# Patient Record
Sex: Male | Born: 1935
Health system: Southern US, Community
[De-identification: ages and names within clinical notes are randomized; demographics above are authoritative.]

## PROBLEM LIST (undated history)

## (undated) DIAGNOSIS — I4819 Other persistent atrial fibrillation: Secondary | ICD-10-CM

## (undated) DIAGNOSIS — I739 Peripheral vascular disease, unspecified: Secondary | ICD-10-CM

## (undated) DIAGNOSIS — I495 Sick sinus syndrome: Secondary | ICD-10-CM

## (undated) DIAGNOSIS — I38 Endocarditis, valve unspecified: Secondary | ICD-10-CM

## (undated) DIAGNOSIS — N200 Calculus of kidney: Secondary | ICD-10-CM

## (undated) DIAGNOSIS — E519 Thiamine deficiency, unspecified: Secondary | ICD-10-CM

## (undated) DIAGNOSIS — G709 Myoneural disorder, unspecified: Secondary | ICD-10-CM

## (undated) DIAGNOSIS — Z87442 Personal history of urinary calculi: Secondary | ICD-10-CM

## (undated) DIAGNOSIS — Z8619 Personal history of other infectious and parasitic diseases: Secondary | ICD-10-CM

## (undated) DIAGNOSIS — E785 Hyperlipidemia, unspecified: Secondary | ICD-10-CM

## (undated) DIAGNOSIS — T7840XA Allergy, unspecified, initial encounter: Secondary | ICD-10-CM

## (undated) DIAGNOSIS — K219 Gastro-esophageal reflux disease without esophagitis: Secondary | ICD-10-CM

## (undated) DIAGNOSIS — K828 Other specified diseases of gallbladder: Secondary | ICD-10-CM

## (undated) DIAGNOSIS — M1990 Unspecified osteoarthritis, unspecified site: Secondary | ICD-10-CM

## (undated) DIAGNOSIS — C61 Malignant neoplasm of prostate: Secondary | ICD-10-CM

## (undated) DIAGNOSIS — I10 Essential (primary) hypertension: Secondary | ICD-10-CM

## (undated) DIAGNOSIS — I499 Cardiac arrhythmia, unspecified: Secondary | ICD-10-CM

## (undated) DIAGNOSIS — Z950 Presence of cardiac pacemaker: Secondary | ICD-10-CM

## (undated) DIAGNOSIS — J189 Pneumonia, unspecified organism: Secondary | ICD-10-CM

## (undated) HISTORY — DX: Thiamine deficiency, unspecified: E51.9

## (undated) HISTORY — DX: Personal history of other infectious and parasitic diseases: Z86.19

## (undated) HISTORY — DX: Other persistent atrial fibrillation: I48.19

## (undated) HISTORY — PX: EYE SURGERY: SHX253

## (undated) HISTORY — DX: Allergy, unspecified, initial encounter: T78.40XA

## (undated) HISTORY — DX: Calculus of kidney: N20.0

## (undated) HISTORY — PX: HERNIA REPAIR: SHX51

## (undated) HISTORY — DX: Peripheral vascular disease, unspecified: I73.9

## (undated) HISTORY — PX: JOINT REPLACEMENT: SHX530

## (undated) HISTORY — DX: Endocarditis, valve unspecified: I38

## (undated) HISTORY — DX: Sick sinus syndrome: I49.5

## (undated) HISTORY — DX: Other specified diseases of gallbladder: K82.8

## (undated) HISTORY — DX: Hyperlipidemia, unspecified: E78.5

---

## 1990-09-05 DIAGNOSIS — J189 Pneumonia, unspecified organism: Secondary | ICD-10-CM

## 1990-09-05 HISTORY — DX: Pneumonia, unspecified organism: J18.9

## 2003-09-06 HISTORY — PX: FRACTURE SURGERY: SHX138

## 2009-04-24 ENCOUNTER — Emergency Department (HOSPITAL_BASED_OUTPATIENT_CLINIC_OR_DEPARTMENT_OTHER): Admission: EM | Admit: 2009-04-24 | Discharge: 2009-04-24 | Payer: Self-pay | Admitting: Emergency Medicine

## 2012-01-30 ENCOUNTER — Inpatient Hospital Stay (HOSPITAL_COMMUNITY)
Admission: AD | Admit: 2012-01-30 | Discharge: 2012-02-01 | DRG: 244 | Disposition: A | Discharge status: Home / Self Care | Payer: Medicare Other | Source: Ambulatory Visit | Attending: Internal Medicine | Admitting: Internal Medicine

## 2012-01-30 ENCOUNTER — Encounter (HOSPITAL_COMMUNITY): Payer: Self-pay | Admitting: Family Medicine

## 2012-01-30 ENCOUNTER — Inpatient Hospital Stay (HOSPITAL_COMMUNITY): Payer: Medicare Other

## 2012-01-30 DIAGNOSIS — Z7901 Long term (current) use of anticoagulants: Secondary | ICD-10-CM

## 2012-01-30 DIAGNOSIS — I4821 Permanent atrial fibrillation: Secondary | ICD-10-CM | POA: Diagnosis present

## 2012-01-30 DIAGNOSIS — I495 Sick sinus syndrome: Principal | ICD-10-CM | POA: Diagnosis present

## 2012-01-30 DIAGNOSIS — R011 Cardiac murmur, unspecified: Secondary | ICD-10-CM

## 2012-01-30 DIAGNOSIS — Z950 Presence of cardiac pacemaker: Secondary | ICD-10-CM

## 2012-01-30 DIAGNOSIS — I4891 Unspecified atrial fibrillation: Secondary | ICD-10-CM | POA: Diagnosis present

## 2012-01-30 DIAGNOSIS — I10 Essential (primary) hypertension: Secondary | ICD-10-CM

## 2012-01-30 DIAGNOSIS — M129 Arthropathy, unspecified: Secondary | ICD-10-CM | POA: Diagnosis present

## 2012-01-30 HISTORY — DX: Pneumonia, unspecified organism: J18.9

## 2012-01-30 HISTORY — DX: Essential (primary) hypertension: I10

## 2012-01-30 HISTORY — DX: Unspecified osteoarthritis, unspecified site: M19.90

## 2012-01-30 LAB — DIFFERENTIAL
Eosinophils Absolute: 0.1 10*3/uL (ref 0.0–0.7)
Eosinophils Relative: 1 % (ref 0–5)
Lymphocytes Relative: 19 % (ref 12–46)
Lymphs Abs: 1.7 10*3/uL (ref 0.7–4.0)
Monocytes Absolute: 1.1 10*3/uL — ABNORMAL HIGH (ref 0.1–1.0)
Monocytes Relative: 12 % (ref 3–12)

## 2012-01-30 LAB — COMPREHENSIVE METABOLIC PANEL
AST: 30 U/L (ref 0–37)
BUN: 28 mg/dL — ABNORMAL HIGH (ref 6–23)
CO2: 26 mEq/L (ref 19–32)
Chloride: 105 mEq/L (ref 96–112)
Creatinine, Ser: 1.45 mg/dL — ABNORMAL HIGH (ref 0.50–1.35)
GFR calc Af Amer: 53 mL/min — ABNORMAL LOW (ref >90)
GFR calc non Af Amer: 46 mL/min — ABNORMAL LOW (ref >90)
Glucose, Bld: 107 mg/dL — ABNORMAL HIGH (ref 70–99)
Total Bilirubin: 0.5 mg/dL (ref 0.3–1.2)

## 2012-01-30 LAB — MAGNESIUM: Magnesium: 2.3 mg/dL (ref 1.5–2.5)

## 2012-01-30 LAB — CBC
HCT: 40.4 % (ref 39.0–52.0)
MCH: 30.7 pg (ref 26.0–34.0)
MCV: 88.6 fL (ref 78.0–100.0)
RBC: 4.56 MIL/uL (ref 4.22–5.81)
WBC: 8.9 10*3/uL (ref 4.0–10.5)

## 2012-01-30 LAB — CARDIAC PANEL(CRET KIN+CKTOT+MB+TROPI)
Relative Index: INVALID (ref 0.0–2.5)
Total CK: 55 U/L (ref 7–232)

## 2012-01-30 MED ORDER — LORATADINE 10 MG PO TABS
10.0000 mg | ORAL_TABLET | Freq: Every day | ORAL | Status: DC | PRN
  Filled 2012-01-30: qty 1

## 2012-01-30 MED ORDER — NITROGLYCERIN 0.4 MG SL SUBL: 0.4000 mg | SUBLINGUAL_TABLET | SUBLINGUAL | Status: DC | PRN

## 2012-01-30 MED ORDER — SODIUM CHLORIDE 0.9 % IV SOLN: 250.0000 mL | INTRAVENOUS | Status: DC | PRN

## 2012-01-30 MED ORDER — ONDANSETRON HCL 4 MG/2ML IJ SOLN: 4.0000 mg | Freq: Four times a day (QID) | INTRAMUSCULAR | Status: DC | PRN

## 2012-01-30 MED ORDER — PNEUMOCOCCAL VAC POLYVALENT 25 MCG/0.5ML IJ INJ
0.5000 mL | INJECTION | INTRAMUSCULAR | Status: AC
  Filled 2012-01-30: qty 0.5

## 2012-01-30 MED ORDER — SODIUM CHLORIDE 0.9 % IJ SOLN: 3.0000 mL | INTRAMUSCULAR | Status: DC | PRN

## 2012-01-30 MED ORDER — ALPRAZOLAM 0.25 MG PO TABS: 0.2500 mg | ORAL_TABLET | Freq: Two times a day (BID) | ORAL | Status: DC | PRN

## 2012-01-30 MED ORDER — ZOLPIDEM TARTRATE 5 MG PO TABS: 5.0000 mg | ORAL_TABLET | Freq: Every evening | ORAL | Status: DC | PRN

## 2012-01-30 MED ORDER — ACETAMINOPHEN 325 MG PO TABS: 650.0000 mg | ORAL_TABLET | ORAL | Status: DC | PRN

## 2012-01-30 MED ORDER — METOPROLOL TARTRATE 25 MG PO TABS
25.0000 mg | ORAL_TABLET | Freq: Two times a day (BID) | ORAL | Status: DC
  Administered 2012-01-30 – 2012-01-31 (×2): 25 mg via ORAL
  Filled 2012-01-30 (×3): qty 1

## 2012-01-30 MED ORDER — SODIUM CHLORIDE 0.9 % IJ SOLN
3.0000 mL | Freq: Two times a day (BID) | INTRAMUSCULAR | Status: DC
  Administered 2012-01-30 – 2012-01-31 (×2): 3 mL via INTRAVENOUS

## 2012-01-30 NOTE — H&P (Signed)
History and Physical  Patient ID: Brian Davidson Patient ID: Brian Davidson MRN: [id number], DOB/AGE: 74-15-1937 5 y.o. Date of Encounter: 01/30/2012  Primary Physician: [facility] Primary Cardiologist: Arnette Felts, PA-C; [place]  Chief Complaint:  Atrial fib - slow VR   HPI: Brian Davidson is a 76 year old male with a history of atrial fibrillation but not coronary artery disease. He is chronically anticoagulated with Coumadin. Recently, he has noticed episodes of weakness. They occur with moderate exertion. He will feel weak and short of breath. He will have difficulty walking because his legs feel very heavy. He also feel like his legs are swelling when he stands to walk. The episodes have been intermittent but have been happening Davidson frequently. He was evaluated for them wants but no abnormality was found. Over the last few days, he has had chest tightness and pressure associated with the symptoms. His wife took his blood pressure during an episode today and his systolic blood pressure was 90 with a heart rate in the 40s. He called Arnette Felts, who saw him in the office. In the office he was found to be hypotensive with a heart rate in the 40s. Admission was arranged at [facility] by Dr. Johney Frame for consideration of a pacemaker. Currently at rest, he is asymptomatic. Of note, he generally does not have palpitations and denies any history of presyncope or syncope until his recent problems.  Past Medical History  Diagnosis Date  . Hypertension   . Atrial fibrillation with slow ventricular response   . Heart murmur   . Shortness of breath   . Pneumonia 1992  . Arthritis   . Chronic anticoagulation    Surgical History:  Past Surgical History  Procedure Date  . Fracture surgery 2005    left ankle    I have reviewed the patient's current medications. Medication Sig  irbesartan-hydrochlorothiazide (AVALIDE) 150-12.5 MG per tablet Take 1 tablet by mouth daily.  loratadine (CLARITIN) 10  MG tablet Take 10 mg by mouth daily as needed. For seasonal allergies  metoprolol succinate (TOPROL-XL) 100 MG 24 hr tablet Take 100 mg by mouth 2 (two) times daily. Take with or immediately following a meal.  verapamil (CALAN) 120 MG tablet Take 120 mg by mouth 2 (two) times daily.  warfarin (COUMADIN) 5 MG tablet Take 5-7.5 mg by mouth daily. Sunday and Tuesday 7.5mg ;  and the rest of the week 5mg    Scheduled Meds:   . pneumococcal 23 valent vaccine  0.5 mL Intramuscular Tomorrow-1000   Continuous Infusions:  PRN Meds:.  Allergies:  Allergies  Allergen Reactions  . Sulfa Antibiotics     Childhood reaction   History   Social History  . Marital Status: Married    Spouse Name: N/A    Number of Children: N/A  . Years of Education: N/A   Occupational History  . Retired   . Part-time driver for car company    Social History Main Topics  . Smoking status: Former Smoker -- 10 years    Types: Pipe, Cigars  . Smokeless tobacco: Never Used  . Alcohol Use: 1.8 oz/week    3 Cans of beer per week  . Drug Use: No  . Sexually Active: Yes   Other Topics Concern  . Not on file   Social History Narrative   Lives with wife, still works part-time as a Hospital doctor. He is active around the house and yard.Marland KitchenMarland KitchenParents are deceased and did not have cardiac issues but he has 2  siblings with atrial fibrillation and a sister-in-law  has a pacemaker.    Review of Systems:  He has no recent illnesses, fevers or chills. He has occasional musculoskeletal aches and pains but they do not significantly limited his activity. He has not had bleeding issues on the Coumadin and his dose rarely has to be adjusted. He has no GI problems and no respiratory difficulty until his current illness. Full 14-point review of systems otherwise negative except as noted above.  Physical Exam: Blood pressure 107/70, pulse 59, temperature 97.5 F (36.4 C), temperature source Oral, resp. rate 18, height 6' (1.829 m), weight 215  lb 9.8 oz (97.8 kg), SpO2 98.00%. General: Well developed, well nourished, male in no acute distress. Head: Normocephalic, atraumatic, sclera non-icteric, no xanthomas, nares are without discharge. Dentition: Good Neck: No carotid bruits. JVD not elevated. No thyromegally Lungs: Good expansion bilaterally. without wheezes or rhonchi. Very few Rales Heart: IRRegular rate and rhythm with S1 S2. Soft systolic murmur, no rubs, or gallops appreciated. Abdomen: Soft, non-tender, non-distended with normoactive bowel sounds. No hepatomegaly. No rebound/guarding. No obvious abdominal masses. Msk:  Strength and tone appear normal for age. No joint deformities or effusions, no spine or costo-vertebral angle tenderness. Extremities: No clubbing or cyanosis. No edema.  Distal pedal pulses are 2+ in 4 extrem Neuro: Alert and oriented X 3. Moves all extremities spontaneously. No focal deficits noted. Psych:  Responds to questions appropriately with a normal affect. Skin: No rashes or lesions noted  Labs: Pending   Telemetry: Atrial fibrillation, a heart rate as low as 39   ASSESSMENT AND PLAN:  Principal Problem:  *Atrial fibrillation with slow ventricular response - we will hold his metoprolol XL 100 mg BID and his verapamil 120 mg daily. He will be monitored on telemetry. He will be evaluated by Dr. Johney Frame in a.m. and considered for a pacemaker. He may have tachycardia/bradycardia syndrome.  Active Problems: Chest pain - He had symptoms with exertion recently which is unusual for him. Will cycle cardiac enzymes and consider an echo. Cath if enzymes are abnormal, otherwise follow up with Arnette Felts.    Heart murmur - Brian Davidson states he has 2 leaky valves, order echo, follow up with [facility]   Chronic anticoagulation - in the office today his INR was 2.4. We will check it now and hold coumadin until the need for an ischemic eval is determined.  SignedBjorn Loser Barrett PA-C 01/30/2012, 4:07  PM   Attending Note:   The patient was seen and examined.  Agree with assessment and plan as noted above.  Pt has chronic A-fib.  He has recently developed severe chest heaviness, tightness and generalized weakness.  He has been noted to have slow HR and low BP for the past 2 days.  He also has noted leg heaviness.    Exam is as above.  Chronic A-fib.    ECG shows rate controlled A-fib.    Imp / Plan:  I am concerned about ischemic heart disease as well as his bradycarda.  I would hold his verapamil and decrease his metoprolol to 25 bid. He will likely need a pacer at some point. I would hold his coumadin for now until we know whether or not he will need a cath.  At a minimum, he will need a stress myoview.  Check cardiac enzymes.  TSH,    Vesta Mixer, Montez Hageman., MD, [facility] 01/30/2012, 5:58 PM

## 2012-01-30 NOTE — Progress Notes (Signed)
ANTICOAGULATION CONSULT NOTE - Initial Consult  Pharmacy Consult for heparin Indication: atrial fibrillation  Allergies  Allergen Reactions  . Sulfa Antibiotics     Childhood reaction    Patient Measurements: Height: 6' (182.9 cm) Weight: 215 lb 9.8 oz (97.8 kg) IBW/kg (Calculated) : 77.6  Heparin Dosing Weight: 97.8kg  Vital Signs: Temp: 97.5 F (36.4 C) (05/27 1402) Temp src: Oral (05/27 1402) BP: 107/70 mmHg (05/27 1402) Pulse Rate: 59  (05/27 1402)  Labs:  Basename 01/30/12 1829  HGB 14.0  HCT 40.4  PLT 125*  APTT --  LABPROT 25.2*  INR 2.24*  HEPARINUNFRC --  CREATININE 1.45*  CKTOTAL 55  CKMB 1.8  TROPONINI <0.30    Estimated Creatinine Clearance: 54.2 ml/min (by C-G formula based on Cr of 1.45).   Medical History: Past Medical History  Diagnosis Date  . Hypertension   . Atrial fibrillation with slow ventricular response   . Heart murmur   . Shortness of breath   . Pneumonia 1992  . Arthritis   . Chronic anticoagulation     Medications:  Coumadin PTA - on hold   Assessment: 68 yoM admitted for CP, hypotension, HR in 40s. Dr. Johney Frame to see pt for consideration of Afib.  Coumadin to be held and IV heparin started once INR < 2 for possible cath procedure.  Current INR 2.24.  Hgb ok, plts low @ 125.  No bleeding noted.    Goal of Therapy:  Heparin level 0.3-0.7 units/ml Monitor platelets by anticoagulation protocol: Yes   Plan:  1.  Hold Coumadin 2.  F/u AM PT/INR, CBC 3.  Start IV heparin once INR < 2.   4.  F/u cath plans  Lataisha Colan E 01/30/2012,7:33 PM

## 2012-01-31 ENCOUNTER — Encounter (HOSPITAL_COMMUNITY)
Admission: AD | Disposition: A | Discharge status: Home / Self Care | Payer: Self-pay | Source: Ambulatory Visit | Attending: Internal Medicine

## 2012-01-31 ENCOUNTER — Encounter (HOSPITAL_COMMUNITY): Payer: Self-pay | Admitting: Internal Medicine

## 2012-01-31 DIAGNOSIS — I498 Other specified cardiac arrhythmias: Secondary | ICD-10-CM

## 2012-01-31 DIAGNOSIS — I495 Sick sinus syndrome: Principal | ICD-10-CM

## 2012-01-31 HISTORY — PX: PERMANENT PACEMAKER INSERTION: SHX5480

## 2012-01-31 LAB — CARDIAC PANEL(CRET KIN+CKTOT+MB+TROPI)
Relative Index: INVALID (ref 0.0–2.5)
Total CK: 51 U/L (ref 7–232)
Total CK: 47 U/L (ref 7–232)
Troponin I: <0.3 ng/mL (ref <0.30)

## 2012-01-31 LAB — LIPID PANEL
Cholesterol: 157 mg/dL (ref 0–200)
VLDL: 22 mg/dL (ref 0–40)

## 2012-01-31 LAB — PROTIME-INR: INR: 2.27 — ABNORMAL HIGH (ref 0.00–1.49)

## 2012-01-31 SURGERY — PERMANENT PACEMAKER INSERTION
Anesthesia: LOCAL

## 2012-01-31 MED ORDER — ACETAMINOPHEN 325 MG PO TABS
325.0000 mg | ORAL_TABLET | ORAL | Status: DC | PRN
  Administered 2012-01-31 – 2012-02-01 (×3): 650 mg via ORAL
  Filled 2012-01-31 (×3): qty 2

## 2012-01-31 MED ORDER — SODIUM CHLORIDE 0.9 % IV SOLN
INTRAVENOUS | Status: DC
  Administered 2012-01-31: 12:00:00 via INTRAVENOUS

## 2012-01-31 MED ORDER — SODIUM CHLORIDE 0.9 % IR SOLN
80.0000 mg | Status: DC
  Filled 2012-01-31: qty 2

## 2012-01-31 MED ORDER — CEFAZOLIN SODIUM 1-5 GM-% IV SOLN
1.0000 g | Freq: Four times a day (QID) | INTRAVENOUS | Status: DC
  Filled 2012-01-31 (×3): qty 50

## 2012-01-31 MED ORDER — SODIUM CHLORIDE 0.45 % IV SOLN
INTRAVENOUS | Status: DC
  Administered 2012-01-31: 10 mL/h via INTRAVENOUS

## 2012-01-31 MED ORDER — ONDANSETRON HCL 4 MG/2ML IJ SOLN: 4.0000 mg | Freq: Four times a day (QID) | INTRAMUSCULAR | Status: DC | PRN

## 2012-01-31 MED ORDER — HEPARIN (PORCINE) IN NACL 2-0.9 UNIT/ML-% IJ SOLN
INTRAMUSCULAR | Status: AC
  Filled 2012-01-31: qty 1000

## 2012-01-31 MED ORDER — MIDAZOLAM HCL 2 MG/2ML IJ SOLN
INTRAMUSCULAR | Status: AC
  Filled 2012-01-31: qty 2

## 2012-01-31 MED ORDER — HYDROCODONE-ACETAMINOPHEN 5-325 MG PO TABS: 1.0000 | ORAL_TABLET | ORAL | Status: DC | PRN

## 2012-01-31 MED ORDER — METOPROLOL SUCCINATE ER 100 MG PO TB24
100.0000 mg | ORAL_TABLET | Freq: Two times a day (BID) | ORAL | Status: DC
  Administered 2012-01-31 – 2012-02-01 (×2): 100 mg via ORAL
  Filled 2012-01-31 (×4): qty 1

## 2012-01-31 MED ORDER — CHLORHEXIDINE GLUCONATE 4 % EX LIQD
60.0000 mL | Freq: Once | CUTANEOUS | Status: DC
  Filled 2012-01-31: qty 60

## 2012-01-31 MED ORDER — FENTANYL CITRATE 0.05 MG/ML IJ SOLN
INTRAMUSCULAR | Status: AC
  Filled 2012-01-31: qty 2

## 2012-01-31 MED ORDER — CEFAZOLIN SODIUM-DEXTROSE 2-3 GM-% IV SOLR
2.0000 g | INTRAVENOUS | Status: DC
  Filled 2012-01-31: qty 50

## 2012-01-31 MED ORDER — CEFAZOLIN SODIUM 1-5 GM-% IV SOLN
1.0000 g | Freq: Four times a day (QID) | INTRAVENOUS | Status: DC
  Filled 2012-01-31 (×2): qty 50

## 2012-01-31 MED ORDER — LIDOCAINE HCL (PF) 1 % IJ SOLN
INTRAMUSCULAR | Status: AC
  Filled 2012-01-31: qty 60

## 2012-01-31 NOTE — H&P (Signed)
  See my consult note for full details.

## 2012-01-31 NOTE — Care Management Note (Signed)
    Page 1 of 1   01/31/2012     8:33:35 AM   CARE MANAGEMENT NOTE 01/31/2012  Patient:  Brian Davidson, Brian Davidson   Account Number:  [id number]  Date Initiated:  01/31/2012  Documentation initiated by:  [facility]  Subjective/Objective Assessment:   h/o Afib - now with brady and dizziness - possible pacemaker needed.  Has spouse.     Action/Plan:   Anticipated DC Date:  02/03/2012   Anticipated DC Plan:  HOME/SELF CARE      DC Planning Services  CM consult      Choice offered to / List presented to:             Status of service:  In process, will continue to follow Medicare Important Message given?   (If response is "NO", the following Medicare IM given date fields will be blank) Date Medicare IM given:   Date Additional Medicare IM given:    Discharge Disposition:    Per UR Regulation:  Reviewed for med. necessity/level of care/duration of stay  If discussed at Long Length of Stay Meetings, dates discussed:    Comments:  PCP - [facility] - Cardiology - Arnette Felts PA  Contact Spouse [place] [phone]  cell [phone]

## 2012-01-31 NOTE — Op Note (Addendum)
SURGEON:  Hillis Range, MD     PREPROCEDURE DIAGNOSIS:    Permanent atrial fibrillation and tachycardia bradycardia syndrome.    POSTPROCEDURE DIAGNOSIS:  Permanent atrial fibrillation and tachycardia bradycardia syndrome.      PROCEDURES:   1. Left upper extremity venography.   2. Pacemaker implantation.     INTRODUCTION: Brian Davidson is a 76 y.o. male  with a history of permanent atrial fibrillation who presents today for pacemaker implantation.  The patient reports intermittent episodes of dizziness over the past few days with associated fatigue.  He has been observed to have symptomatic bradycardia.  Off of rate controlling medicines for his afib, he has developed afib with RVR.  The patient therefore presents today for pacemaker implantation.  Prior to the procedure, a limited echo was performed by me which revealed a relatively preserved ejection fraction.    DESCRIPTION OF PROCEDURE:  Informed written consent was obtained, and  the patient was brought to the electrophysiology lab in a fasting state.  The patient was adequately sedated with IV versed and fentanyl as outlined in the nursing report for the procedure today.  The patients left chest was prepped and draped in the usual sterile fashion by the EP lab staff. The skin overlying the left deltopectoral region was infiltrated with lidocaine for local analgesia.  A 4-cm incision was made over the left deltopectoral region.  A left subcutaneous pacemaker pocket was fashioned using a combination of sharp and blunt dissection. Electrocautery was required to assure hemostasis.    Left Upper Extremity Venography: A venogram of the left upper extremity was performed, which revealed a large left cephalic vein, which emptied into a large left subclavian vein.  The left axillary vein was moderate in size.    RA/RV Lead Placement: Access of the axillary vein was difficult and several attempts were made without success.  The left cephalic vein  was therefore directly visualized and cannulated.  Through the left cephalic vein, a [employer] Jude Medical model [id number] (serial number  [id number]) right ventricular lead were advanced with fluoroscopic visualization into the right atrial appendage and right ventricular apex positions respectively.  Initial right ventricular lead R-waves measured 13 mV with an impedance of 725 ohms and a threshold of 0.3 V at 0.5 msec.  Both leads were secured to the pectoralis fascia using #2-0 silk over the suture sleeves.   Device Placement:  The leads were then connected to a [employer] Accent SR RF model  [id number] (serial number [id number] ) pacemaker.  The pocket was irrigated with copious gentamicin solution.  The pacemaker was then placed into the pocket.  The pocket was then closed in 2 layers with 2.0 Vicryl suture for the subcutaneous and subcuticular layers.  Steri-  Strips and a sterile dressing were then applied.  There were no early apparent complications.     CONCLUSIONS:   1. Successful implantation of a Industrial/product designer SR RF dual-chamber pacemaker for tachycardia bradycardia syndrome  2. No early apparent complications.           Hillis Range, MD 01/31/2012 4:36 PM

## 2012-01-31 NOTE — Consult Note (Signed)
ELECTROPHYSIOLOGY CONSULT NOTE    Referring Physician: Arnette Felts  Admit Date: 01/30/2012  Reason for consultation: tachybrady syndrome  Brian Davidson is a 76 y.o. male with a h/o longstanding persistent atrial fibrillation who presents with symptomatic bradycardia. He was diagnosed with atrial fibrillation many years ago.  He reports failing rhythm control at that time.  He has been managed previously by Dr Shelva Majestic with a strategy of rate control and anticoagulation.  He reports doing very well with this regimen until recently.  Per Arnette Felts, he has had elevated ventricular rates necessitating rate controlling medicines. Over the past week, he has had intermittent dizziness and fatigue.  He denies syncope.  He checked his pulse with these symptoms and found it to be in the 40s.  Upon recent evaluation by Mr Elise Benne, his heart rate in afib was 40s.  He was admitted to [facility] and rate controlling medicines were held.  His heart rates are now 120s.  EP is therefore consulted for further management.    Past Medical History  Diagnosis Date  . Hypertension   . Atrial fibrillation with slow ventricular response     long standing persistent atrial fibrillation  . Heart murmur   . Pneumonia 1992  . Arthritis   . Chronic anticoagulation    Past Surgical History  Procedure Date  . Fracture surgery 2005    left ankle       . metoprolol tartrate  25 mg Oral BID  . pneumococcal 23 valent vaccine  0.5 mL Intramuscular Tomorrow-1000  . sodium chloride  3 mL Intravenous Q12H      Allergies  Allergen Reactions  . Sulfa Antibiotics     Childhood reaction    History   Social History  . Marital Status: Married    Spouse Name: N/A    Number of Children: N/A  . Years of Education: N/A   Occupational History  . Retired   . Part-time driver for car company    Social History Main Topics  . Smoking status: Former Smoker -- 10 years    Types: Pipe, Cigars  . Smokeless tobacco:  Never Used  . Alcohol Use: No  . Drug Use: No  . Sexually Active: Yes   Other Topics Concern  . Not on file   Social History Narrative   Lives with wife, still works part-time as a Hospital doctor. He is active around the house and yard.Marland KitchenMarland KitchenParents are deceased and did not have cardiac issues but he has 2 siblings with atrial fibrillation and a sister-in-law  has a pacemaker.    Family History  Problem Relation Age of Onset  . Hypertension      ROS- All systems are reviewed and negative except as per the HPI above  Physical Exam: Telemetry: reveals initially afib with V rates 40s, now afib with V rates 120a Filed Vitals:   01/30/12 1402 01/30/12 2036 01/30/12 2038 01/31/12 0515  BP: 107/70  149/88 140/87  Pulse: 59 86  90  Temp: 97.5 F (36.4 C) 98.2 F (36.8 C)  98.2 F (36.8 C)  TempSrc: Oral Oral  Oral  Resp: 18 16  15   Height: 6' (1.829 m)     Weight: 215 lb 9.8 oz (97.8 kg)   212 lb 4 oz (96.276 kg)  SpO2: 98% 97%  94%    GEN- The patient is well appearing, alert and oriented x 3 today.   Head- normocephalic, atraumatic Eyes-  Sclera clear, conjunctiva pink Ears- hearing intact  Oropharynx- clear Neck- supple, no JVP Lymph- no cervical lymphadenopathy Lungs- Clear to ausculation bilaterally, normal work of breathing Heart- irregular rate and rhythm, 2/6 SEM at the apex GI- soft, NT, ND, + BS Extremities- no clubbing, cyanosis, or edema MS- no significant deformity or atrophy Skin- no rash or lesion Psych- euthymic mood, full affect Neuro- strength and sensation are intact  EKG- afib, 84 bpm  Labs:   Lab Results  Component Value Date   WBC 8.9 01/30/2012   HGB 14.0 01/30/2012   HCT 40.4 01/30/2012   MCV 88.6 01/30/2012   PLT 125* 01/30/2012    Lab 01/30/12 1829  NA 139  K 4.1  CL 105  CO2 26  BUN 28*  CREATININE 1.45*  CALCIUM 9.2  PROT 6.6  BILITOT 0.5  ALKPHOS 78  ALT 34  AST 30  GLUCOSE 107*   Lab Results  Component Value Date   CKTOTAL 47  01/31/2012   CKMB 1.6 01/31/2012   TROPONINI <0.30 01/31/2012    Lab Results  Component Value Date   CHOL 157 01/31/2012   Lab Results  Component Value Date   HDL 34* 01/31/2012   Lab Results  Component Value Date   LDLCALC 101* 01/31/2012   Lab Results  Component Value Date   TRIG 108 01/31/2012   Lab Results  Component Value Date   CHOLHDL 4.6 01/31/2012   No results found for this basename: LDLDIRECT     Echo:  pending  ASSESSMENT AND PLAN:   1.  Afib- long standing persistent  Would rate control long term Continue coumadin (Goal INR 2-3)  2. Tachycardia Bradycardia Syndrome  The patient has symptomatic bradycardia but requires AV nodal agents for rate control of his afib.  I would therefore recommend pacemaker implantation at this time.  Risks, benefits, alternatives to pacemaker implantation were discussed in detail with the patient today. The patient understands that the risks include but are not limited to bleeding, infection, pneumothorax, perforation, tamponade, vascular damage, renal failure, MI, stroke, death,  and lead dislodgement and wishes to proceed. We will therefore schedule the procedure later today.    Hillis Range, MD 01/31/2012  9:34 AM

## 2012-02-01 ENCOUNTER — Encounter: Payer: Self-pay | Admitting: *Deleted

## 2012-02-01 ENCOUNTER — Inpatient Hospital Stay (HOSPITAL_COMMUNITY): Payer: Medicare Other

## 2012-02-01 DIAGNOSIS — I4891 Unspecified atrial fibrillation: Secondary | ICD-10-CM

## 2012-02-01 DIAGNOSIS — Z950 Presence of cardiac pacemaker: Secondary | ICD-10-CM | POA: Insufficient documentation

## 2012-02-01 DIAGNOSIS — I359 Nonrheumatic aortic valve disorder, unspecified: Secondary | ICD-10-CM

## 2012-02-01 LAB — BASIC METABOLIC PANEL
BUN: 24 mg/dL — ABNORMAL HIGH (ref 6–23)
CO2: 22 mEq/L (ref 19–32)
Chloride: 105 mEq/L (ref 96–112)
GFR calc Af Amer: 66 mL/min — ABNORMAL LOW (ref >90)
Potassium: 4 mEq/L (ref 3.5–5.1)

## 2012-02-01 LAB — PROTIME-INR: Prothrombin Time: 21.6 seconds — ABNORMAL HIGH (ref 11.6–15.2)

## 2012-02-01 MED ORDER — WARFARIN - PHARMACIST DOSING INPATIENT: Freq: Every day | Status: DC

## 2012-02-01 MED ORDER — WARFARIN SODIUM 7.5 MG PO TABS
7.5000 mg | ORAL_TABLET | Freq: Once | ORAL | Status: AC
  Administered 2012-02-01: 7.5 mg via ORAL
  Filled 2012-02-01: qty 1

## 2012-02-01 NOTE — Discharge Summary (Signed)
ELECTROPHYSIOLOGY DISCHARGE SUMMARY    Patient ID: Brian Davidson,  MRN: [id number], DOB/AGE: 1935/11/07 76 y.o.  Admit date: 01/30/2012 Discharge date: 02/01/2012  Primary Care Physician: Brian Davidson  Primary Cardiologist: Brian Felts, PA-C at [facility] Primary Electrophysiologist: Brian Range, MD  Primary Discharge Diagnosis:  1.  Tachy-brady syndrome s/p PPM implantation  Secondary Discharge Diagnoses:  1.  Atrial fibrillation 2.  HTN 3.  Coagulopathy secondary to chronic warfarin therapy  Procedures This Admission:  1.  Dual chamber PPM implantation - per operative report - [address] Jude Davidson Accent SR RF model [id number] (serial number [id number] ) pacemaker, St Jude Davidson model [id number] (serial number [id number]) leads were advanced with fluoroscopic visualization into the right atrial appendage and right ventricular apex positions respectively 2.  Transthoracic echocardiogram 02/01/2012 - per report, findings listed below: - Left ventricle: The cavity size was normal. Wall thickness was normal. Systolic function was normal. The estimated ejection fraction was in the Davidson of 55% to 60%. Wall motion was normal; there were no regional wall motion abnormalities. Indeterminant diastolic function. - Aortic valve: Trileaflet; mildly calcified leaflets. There was no stenosis. Mild regurgitation. - Aorta: Ascending aorta was not fully visualized but was dilated to 4.3 cm in the portion visualized. Dilated aortic root. Aortic root dimension: 43mm (ED). - Mitral valve: Mild regurgitation. - Left atrium: The atrium was mildly dilated. - Right ventricle: The cavity size was normal. Pacer wire or catheter noted in right ventricle. Systolic function was normal. - Right atrium: The atrium was mildly dilated. - Tricuspid valve: Peak RV-RA gradient: 26mm Hg (S). - Pulmonary arteries: PA peak pressure: 36mm Hg (S). - Systemic veins: IVC not well-visualized, estimate RA pressure 10  mmHg.  History and Hospital Course:  For history, please see admission H&P for full details. Briefly, Brian Davidson is a 76 year old gentleman with long standing persistent atrial fibrillation who presented with dizziness and fatigue, found to have symptomatic bradycardia with slow AF and rates in the 40s. He was admitted to [facility] and his rate controlling medications held. He subsequently developed RVR. Given tachy-brady syndrome, dual chamber PPM implantation was recommended to allow use of AV nodal blocking agents to treat RVR. Brian Davidson agreed to proceed and he underwent PPM implantation by Dr. Johney Frame on 01/31/2012. There were no immediate complications and he remains hemodynamically stable. His implant site is intact with no evidence of bleeding or hematoma. His chest x-ray shows stable lead placement without pneumothorax. His device interrogation shows normal PPM function with stable lead parameters. His BB and warfarin have been restarted. On telemetry, he remains in AF which is overall rate controlled. He has been seen, examined and deemed stable for discharge by Dr. Hillis Davidson. Of note, his echocardiogram revealed aortic root dilatation (4.3 cm) which Brian Davidson states is being followed by Brian Felts, PA-C.  Discharge Vitals: Blood pressure 148/91, pulse 86, temperature 98.4 F (36.9 C), temperature source Oral, resp. rate 18, height 6' (1.829 m), weight 209 lb 10.5 oz (95.1 kg), SpO2 93.00%.   Labs: Component Value Date   WBC 8.9 01/30/2012   HGB 14.0 01/30/2012   HCT 40.4 01/30/2012   MCV 88.6 01/30/2012   PLT 125* 01/30/2012    Lab 02/01/12 0610 01/30/12 1829  NA 140 --  K 4.0 --  CL 105 --  CO2 22 --  BUN 24* --  CREATININE 1.22 --  CALCIUM 8.9 --  PROT -- 6.6  BILITOT -- 0.5  ALKPHOS --  78  ALT -- 34  AST -- 30  GLUCOSE 91 --    Lab Results  Component Value Date   CKTOTAL 47 01/31/2012   CKMB 1.6 01/31/2012   TROPONINI <0.30 01/31/2012     Basename 02/01/12 0610   INR 1.84*    Disposition:  The patient is being discharged in stable condition.  Follow-up:  Follow up with [facility] on 02/13/2012. (At 11:30 AM for wound check)    Contact information:   [address]  [address] [place], [place] [address] [phone]    Follow up with Brian Range, MD on 05/09/2012. (At 11:00 AM)    Contact information:   [address] [place] [address] [phone]    Follow up with Roxanne Mins, PA-C. Schedule an appointment as soon as possible for a visit on 02/06/2012. (On Monday, 02/06/2012 for Coumadin (INR) follow-up)    Contact information:   [facility]  [address] [place] [address] [phone]    Discharge Medications:   STOP taking these medications     verapamil 120 MG tablet       TAKE these medications     irbesartan-hydrochlorothiazide 150-12.5 MG per tablet   Commonly known as: AVALIDE   Take 1 tablet by mouth daily.      loratadine 10 MG tablet   Commonly known as: CLARITIN   Take 10 mg by mouth daily as needed. For seasonal allergies      metoprolol succinate 100 MG 24 hr tablet   Commonly known as: TOPROL-XL   Take 100 mg by mouth 2 (two) times daily. Take with or immediately following a meal.      warfarin 5 MG tablet   Commonly known as: COUMADIN   Take 5-7.5 mg by mouth daily. Sunday and Tuesday 7.5mg ;  and the rest of the week 5mg       Duration of Discharge Encounter: Greater than 30 minutes including physician time.  Signed, Rick Duff, PA-C 02/01/2012, 2:20 PM  I have seen, examined the patient, and reviewed the above assessment and plan.   Co Sign: Brian Range, MD 02/02/2012 3:02 PM

## 2012-02-01 NOTE — Progress Notes (Signed)
SUBJECTIVE: The patient is doing well today s/p PPM implantation.  At this time, he denies chest pain, shortness of breath, or any new concerns.     Marland Kitchen  ceFAZolin (ANCEF) IV  1 g Intravenous Q6H  . fentaNYL      . heparin      . lidocaine      . metoprolol succinate  100 mg Oral BID  . midazolam      . midazolam      . pneumococcal 23 valent vaccine  0.5 mL Intramuscular Tomorrow-1000  . DISCONTD:  ceFAZolin (ANCEF) IV  1 g Intravenous Q6H  . DISCONTD:  ceFAZolin (ANCEF) IV  2 g Intravenous On Call  . DISCONTD: chlorhexidine  60 mL Topical Once  . DISCONTD: gentamicin irrigation  80 mg Irrigation On Call  . DISCONTD: metoprolol tartrate  25 mg Oral BID  . DISCONTD: sodium chloride  3 mL Intravenous Q12H      . DISCONTD: sodium chloride 10 mL/hr (01/31/12 1204)  . DISCONTD: sodium chloride 50 mL/hr at 01/31/12 1204    OBJECTIVE: Physical Exam: Filed Vitals:   01/31/12 2200 01/31/12 2300 02/01/12 0612 02/01/12 0654  BP: 132/78 142/73  148/91  Pulse: 102 88  86  Temp:    98.4 F (36.9 C)  TempSrc:    Oral  Resp:    18  Height:      Weight:   209 lb 10.5 oz (95.1 kg)   SpO2:  98%  93%    Intake/Output Summary (Last 24 hours) at 02/01/12 0836 Last data filed at 02/01/12 0612  Gross per 24 hour  Intake      0 ml  Output    350 ml  Net   -350 ml    Telemetry reveals afib, V rate 80- 100 bpm  GEN- The patient is well appearing, alert and oriented x 3 today.   Head- normocephalic, atraumatic Eyes-  Sclera clear, conjunctiva pink Ears- hearing intact Oropharynx- clear Neck- supple, no JVP Lymph- no cervical lymphadenopathy Lungs- Clear to ausculation bilaterally, normal work of breathing Heart- irregular rate and rhythm,   GI- soft, NT, ND, + BS Extremities- no clubbing, cyanosis, or edema Skin-  Pacemaker site without hematoma  LABS: Basic Metabolic Panel:  Basename 02/01/12 0610 01/30/12 1829  NA 140 139  K 4.0 4.1  CL 105 105  CO2 22 26  GLUCOSE 91  107*  BUN 24* 28*  CREATININE 1.22 1.45*  CALCIUM 8.9 9.2  MG -- 2.3  PHOS -- --   Liver Function Tests:  [facility] 01/30/12 1829  AST 30  ALT 34  ALKPHOS 78  BILITOT 0.5  PROT 6.6  ALBUMIN 3.5   No results found for this basename: LIPASE:2,AMYLASE:2 in the last 72 hours CBC:  Basename 01/30/12 1829  WBC 8.9  NEUTROABS 5.9  HGB 14.0  HCT 40.4  MCV 88.6  PLT 125*   Cardiac Enzymes:  Basename 01/31/12 0545 01/31/12 0002 01/30/12 1829  CKTOTAL 47 51 55  CKMB 1.6 1.6 1.8  CKMBINDEX -- -- --  TROPONINI <0.30 <0.30 <0.30   BNP: No components found with this basename: POCBNP:3 D-Dimer: No results found for this basename: DDIMER:2 in the last 72 hours Hemoglobin A1C: No results found for this basename: HGBA1C in the last 72 hours Fasting Lipid Panel:  Basename 01/31/12 0545  CHOL 157  HDL 34*  LDLCALC 101*  TRIG 108  CHOLHDL 4.6  LDLDIRECT --   Thyroid Function Tests:  [employer]  01/30/12 1829  TSH 1.357  T4TOTAL --  T3FREE --  THYROIDAB --   Anemia Panel: ASSESSMENT AND PLAN:  Principal Problem:  *Atrial fibrillation with slow ventricular response Active Problems:  Heart murmur  Chronic anticoagulation  Tachycardia-bradycardia syndrome  1. Tachy/brady- doing well s/p PPM CXR reviewed Pacemaker interrogation reviewed  2. afib- resume toprol for rate control Resume home coumadin dosing  Echo remains pending.  Will ambulate.  IF echo is ok and heart rates are controlled he may go home later today.   Hillis Range, MD 02/01/2012 8:36 AM

## 2012-02-01 NOTE — Discharge Instructions (Addendum)
   Supplemental Discharge Instructions for  Pacemaker/Defibrillator Patients  Activity No heavy lifting or vigorous activity with your left/right arm for 6 to 8 weeks.  Do not raise your left/right arm above your head for one week.  Gradually raise your affected arm as drawn below.           06/01                       06/02                       06/03                      06/04       NO DRIVING for 1 week; you may begin driving on 16/06/9603. WOUND CARE   Keep the wound area clean and dry.  Do not get this area wet for one week. No showers for one week; you may shower on 02/08/2012.   The tape/steri-strips on your wound will fall off; do not pull them off.  No bandage is needed on the site.  DO  NOT apply any creams, oils, or ointments to the wound area.   If you notice any drainage or discharge from the wound, any swelling or bruising at the site, or you develop a fever > 101? F after you are discharged home, call the office at once.  Special Instructions   You are still able to use cellular telephones; use the ear opposite the side where you have your pacemaker/defibrillator.  Avoid carrying your cellular phone near your device.   When traveling through airports, show security personnel your identification card to avoid being screened in the metal detectors.  Ask the security personnel to use the hand wand.   Avoid arc welding equipment, MRI testing (magnetic resonance imaging), TENS units (transcutaneous nerve stimulators).  Call the office for questions about other devices.   Avoid electrical appliances that are in poor condition or are not properly grounded.   Microwave ovens are safe to be near or to operate.

## 2012-02-01 NOTE — Progress Notes (Signed)
*  PRELIMINARY RESULTS* Echocardiogram 2D Echocardiogram has been performed.  Brian Davidson 02/01/2012, 12:54 PM

## 2012-02-01 NOTE — Progress Notes (Signed)
ANTICOAGULATION CONSULT NOTE - Initial Consult  Pharmacy Consult for Coumadin Indication: Afib  Allergies  Allergen Reactions  . Sulfa Antibiotics     Childhood reaction    Patient Measurements: Height: 6' (182.9 cm) Weight: 209 lb 10.5 oz (95.1 kg) IBW/kg (Calculated) : 77.6  Heparin Dosing Weight:   Vital Signs: Temp: 98.4 F (36.9 C) (05/29 0654) Temp src: Oral (05/29 0654) BP: 148/91 mmHg (05/29 0654) Pulse Rate: 86  (05/29 0654)  Labs:  Alvira Philips 02/01/12 0610 01/31/12 0545 01/31/12 0002 01/30/12 1829  HGB -- -- -- 14.0  HCT -- -- -- 40.4  PLT -- -- -- 125*  APTT -- -- -- --  LABPROT 21.6* 25.4* -- 25.2*  INR 1.84* 2.27* -- 2.24*  HEPARINUNFRC -- -- -- --  CREATININE 1.22 -- -- 1.45*  CKTOTAL -- 47 51 55  CKMB -- 1.6 1.6 1.8  TROPONINI -- <0.30 <0.30 <0.30    Estimated Creatinine Clearance: 63.6 ml/min (by C-G formula based on Cr of 1.22).   Medical History: Past Medical History  Diagnosis Date  . Hypertension   . Atrial fibrillation with slow ventricular response     long standing persistent atrial fibrillation  . Heart murmur   . Pneumonia 1992  . Arthritis   . Chronic anticoagulation     Medications:  Prescriptions prior to admission  Medication Sig Dispense Refill  . irbesartan-hydrochlorothiazide (AVALIDE) 150-12.5 MG per tablet Take 1 tablet by mouth daily.      Marland Kitchen loratadine (CLARITIN) 10 MG tablet Take 10 mg by mouth daily as needed. For seasonal allergies      . metoprolol succinate (TOPROL-XL) 100 MG 24 hr tablet Take 100 mg by mouth 2 (two) times daily. Take with or immediately following a meal.      . verapamil (CALAN) 120 MG tablet Take 120 mg by mouth 2 (two) times daily.      Marland Kitchen warfarin (COUMADIN) 5 MG tablet Take 5-7.5 mg by mouth daily. Sunday and Tuesday 7.5mg ;  and the rest of the week 5mg         Assessment: 76yom to restart Coumadin for Afib s/p PPM placement (5/28). INR (1.84) is subtherapeutic after decreasing with held doses  x 2 while awaiting PPM placement. PTA regimen: 5mg  daily except 7.5mg  Tue/Sun. Since INR has dropped, will go ahead and restart with 7.5mg  dose tonight. - No CBC this AM - No significant bleeding reported  Goal of Therapy:  INR 2-3   Plan:  1. Coumadin 7.5mg  po x 1 today 2. Daily PT/INR  Cleon Dew [phone] 02/01/2012,9:02 AM

## 2012-02-13 ENCOUNTER — Ambulatory Visit (INDEPENDENT_AMBULATORY_CARE_PROVIDER_SITE_OTHER): Payer: Medicare Other | Admitting: *Deleted

## 2012-02-13 ENCOUNTER — Encounter: Payer: Self-pay | Admitting: Internal Medicine

## 2012-02-13 DIAGNOSIS — I4891 Unspecified atrial fibrillation: Secondary | ICD-10-CM

## 2012-02-13 NOTE — Progress Notes (Signed)
Wound check-PPM 

## 2012-02-14 LAB — PACEMAKER DEVICE OBSERVATION: BATTERY VOLTAGE: 3.1133 V

## 2012-02-15 ENCOUNTER — Telehealth: Payer: Self-pay | Admitting: Internal Medicine

## 2012-02-15 NOTE — Telephone Encounter (Signed)
Spoke w/pt and pt aware that transmitter set up properly. Transmission was received on 02-11-12. Pt aware and all questions was answered.

## 2012-02-15 NOTE — Telephone Encounter (Signed)
Please return call to patient at hm or cell# regarding questions about his device as well as remote transmission.

## 2012-03-13 ENCOUNTER — Encounter: Payer: Self-pay | Admitting: Internal Medicine

## 2012-04-03 ENCOUNTER — Other Ambulatory Visit: Payer: Self-pay | Admitting: *Deleted

## 2012-04-03 ENCOUNTER — Telehealth: Payer: Self-pay | Admitting: *Deleted

## 2012-04-03 MED ORDER — DILTIAZEM HCL ER COATED BEADS 120 MG PO CP24
120.0000 mg | ORAL_CAPSULE | Freq: Every day | ORAL | Status: DC
Start: 2012-04-03 — End: 2012-05-09

## 2012-04-03 NOTE — Telephone Encounter (Signed)
Spoke with pt in regards to transmission with alerts on 03-13-12. Pt had 3 high ventricular rates. Pt taking Toprol XL 100mg  bid with no missed doses. Per Dr Johney Frame add Diltiazem CD 120mg . Pt aware of adding medication. All questions was answered and [facility] [address] in [place].

## 2012-05-09 ENCOUNTER — Encounter: Payer: Self-pay | Admitting: Internal Medicine

## 2012-05-09 ENCOUNTER — Telehealth: Payer: Self-pay | Admitting: Internal Medicine

## 2012-05-09 ENCOUNTER — Ambulatory Visit (INDEPENDENT_AMBULATORY_CARE_PROVIDER_SITE_OTHER): Payer: Medicare Other | Admitting: Internal Medicine

## 2012-05-09 VITALS — BP 134/86 | HR 114 | Resp 18 | Ht 72.0 in | Wt 215.1 lb

## 2012-05-09 DIAGNOSIS — I4891 Unspecified atrial fibrillation: Secondary | ICD-10-CM

## 2012-05-09 DIAGNOSIS — I10 Essential (primary) hypertension: Secondary | ICD-10-CM

## 2012-05-09 DIAGNOSIS — I495 Sick sinus syndrome: Secondary | ICD-10-CM

## 2012-05-09 LAB — PACEMAKER DEVICE OBSERVATION
BMOD-0002RV: 10
BRDY-0002RV: 60 {beats}/min
RV LEAD AMPLITUDE: 12 mv
RV LEAD IMPEDENCE PM: 700 Ohm
VENTRICULAR PACING PM: 7.2

## 2012-05-09 MED ORDER — DILTIAZEM HCL ER COATED BEADS 120 MG PO CP24: 120.0000 mg | ORAL_CAPSULE | Freq: Every day | ORAL | Status: DC

## 2012-05-09 MED ORDER — DILTIAZEM HCL ER COATED BEADS 240 MG PO CP24: 240.0000 mg | ORAL_CAPSULE | Freq: Every day | ORAL | Status: DC

## 2012-05-09 NOTE — Assessment & Plan Note (Signed)
Normal pacemaker function See Pace Art report No changes today  Pacemaker interrogation reveals frequently elevated ventricular rates Increase cardizem CD to 240mg  daily

## 2012-05-09 NOTE — Progress Notes (Signed)
PCP: Brian Davidson is a 76 y.o. male who presents today for routine electrophysiology followup.  Since his recent pacemaker implantation, the patient reports doing very well.  His exercise tolerance and leg fatigue have dramatically improved.  Today, he denies symptoms of palpitations, chest pain, shortness of breath,  lower extremity edema (above baseline), dizziness, presyncope, or syncope.  The patient is otherwise without complaint today.   Past Medical History  Diagnosis Date  . Hypertension   . Atrial fibrillation with slow ventricular response     long standing persistent atrial fibrillation  . Heart murmur   . Pneumonia 1992  . Arthritis   . Chronic anticoagulation   . Tachycardia-bradycardia     s/p PPM   Past Surgical History  Procedure Date  . Fracture surgery 2005    left ankle  . Pacemaker insertion 01/31/12    SJM Accent SR RF implanted by DR Meosha Castanon    Current Outpatient Prescriptions  Medication Sig Dispense Refill  . diltiazem (CARDIZEM CD) 120 MG 24 hr capsule Take 1 capsule (120 mg total) by mouth daily.  90 capsule  3  . irbesartan-hydrochlorothiazide (AVALIDE) 150-12.5 MG per tablet Take 1 tablet by mouth daily.      . metoprolol succinate (TOPROL-XL) 100 MG 24 hr tablet Take 100 mg by mouth 2 (two) times daily. Take with or immediately following a meal.      . warfarin (COUMADIN) 5 MG tablet Take 5-7.5 mg by mouth daily. Sunday and Tuesday 7.5mg ;  and the rest of the week 5mg       . loratadine (CLARITIN) 10 MG tablet Take 10 mg by mouth daily as needed. For seasonal allergies        Physical Exam: Filed Vitals:   05/09/12 1126  BP: 134/86  Pulse: 114  Resp: 18  Height: 6' (1.829 m)  Weight: 215 lb 1.9 oz (97.578 kg)  SpO2: 97%    GEN- The patient is well appearing, alert and oriented x 3 today.   Head- normocephalic, atraumatic Eyes-  Sclera clear, conjunctiva pink Ears- hearing intact Oropharynx- clear Lungs- Clear to ausculation  bilaterally, normal work of breathing Heart- irrgular rate and rhythm, no murmurs, rubs or gallops, PMI not laterally displaced GI- soft, NT, ND, + BS Extremities- no clubbing, cyanosis, or edema Pacemaker pocket is well healed    Assessment and Plan:

## 2012-05-09 NOTE — Addendum Note (Signed)
Addended by: Gypsy Balsam K on: 05/09/2012 11:56 AM   Modules accepted: Orders

## 2012-05-09 NOTE — Assessment & Plan Note (Signed)
Continue coumadin long term   Return in 9 months

## 2012-05-09 NOTE — Assessment & Plan Note (Signed)
Increase cardizem as above

## 2012-05-09 NOTE — Patient Instructions (Addendum)
Your physician wants you to follow-up in: 9 months with Dr Johney Frame.  You will receive a reminder letter in the mail two months in advance. If you don't receive a letter, please call our office to schedule the follow-up appointment.   Increase Cardizem to 240mg  1 tablet daily.  You can take 2 tablets of the 120mg  until you run out.

## 2012-05-09 NOTE — Telephone Encounter (Signed)
Was sent to [facility]

## 2012-05-09 NOTE — Telephone Encounter (Signed)
Pt requested  Diltiazem  [facility], would [facility]

## 2012-05-15 ENCOUNTER — Telehealth: Payer: Self-pay | Admitting: Internal Medicine

## 2012-05-15 MED ORDER — DILTIAZEM HCL ER COATED BEADS 240 MG PO CP24: 240.0000 mg | ORAL_CAPSULE | Freq: Every day | ORAL | Status: DC

## 2012-05-15 NOTE — Telephone Encounter (Signed)
RX was sent in for 240mg    He is to take 2 of the 120mg  until the 240mg  come in

## 2012-05-15 NOTE — Telephone Encounter (Signed)
New Problem:     Patient called in wanting to speak with you to ensure that his prescription for diltiazem (CARDIZEM CD) 120 MG 24 hr capsule was called in to [facility] for double the dose.  Please call back.

## 2012-05-16 ENCOUNTER — Other Ambulatory Visit: Payer: Self-pay

## 2012-05-16 MED ORDER — DILTIAZEM HCL ER COATED BEADS 240 MG PO CP24: 240.0000 mg | ORAL_CAPSULE | Freq: Every day | ORAL | Status: DC

## 2012-08-13 ENCOUNTER — Encounter: Payer: Self-pay | Admitting: Internal Medicine

## 2012-08-13 ENCOUNTER — Ambulatory Visit (INDEPENDENT_AMBULATORY_CARE_PROVIDER_SITE_OTHER): Payer: Medicare Other | Admitting: *Deleted

## 2012-08-13 DIAGNOSIS — Z950 Presence of cardiac pacemaker: Secondary | ICD-10-CM

## 2012-08-13 DIAGNOSIS — I4891 Unspecified atrial fibrillation: Secondary | ICD-10-CM

## 2012-08-13 LAB — REMOTE PACEMAKER DEVICE
BRDY-0002RV: 60 {beats}/min
BRDY-0004RV: 130 {beats}/min
RV LEAD AMPLITUDE: 12 mv

## 2012-08-15 ENCOUNTER — Encounter: Payer: Self-pay | Admitting: *Deleted

## 2012-11-19 ENCOUNTER — Ambulatory Visit (INDEPENDENT_AMBULATORY_CARE_PROVIDER_SITE_OTHER): Payer: Medicare Other | Admitting: *Deleted

## 2012-11-19 ENCOUNTER — Other Ambulatory Visit: Payer: Self-pay | Admitting: Internal Medicine

## 2012-11-19 ENCOUNTER — Encounter: Payer: Self-pay | Admitting: Internal Medicine

## 2012-11-19 DIAGNOSIS — I4891 Unspecified atrial fibrillation: Secondary | ICD-10-CM

## 2012-11-19 DIAGNOSIS — Z950 Presence of cardiac pacemaker: Secondary | ICD-10-CM

## 2012-11-21 LAB — REMOTE PACEMAKER DEVICE
BATTERY VOLTAGE: 2.98 V
BMOD-0002RV: 10
BRDY-0004RV: 130 {beats}/min
RV LEAD IMPEDENCE PM: 630 Ohm

## 2012-12-18 ENCOUNTER — Encounter: Payer: Self-pay | Admitting: *Deleted

## 2013-01-14 ENCOUNTER — Ambulatory Visit (INDEPENDENT_AMBULATORY_CARE_PROVIDER_SITE_OTHER): Payer: Medicare Other | Admitting: Internal Medicine

## 2013-01-14 ENCOUNTER — Encounter: Payer: Self-pay | Admitting: Internal Medicine

## 2013-01-14 VITALS — BP 146/66 | HR 77 | Ht 72.0 in | Wt 223.0 lb

## 2013-01-14 DIAGNOSIS — Z7901 Long term (current) use of anticoagulants: Secondary | ICD-10-CM

## 2013-01-14 DIAGNOSIS — I4891 Unspecified atrial fibrillation: Secondary | ICD-10-CM

## 2013-01-14 DIAGNOSIS — I495 Sick sinus syndrome: Secondary | ICD-10-CM

## 2013-01-14 DIAGNOSIS — Z950 Presence of cardiac pacemaker: Secondary | ICD-10-CM

## 2013-01-14 LAB — PACEMAKER DEVICE OBSERVATION
BATTERY VOLTAGE: 2.9779 V
RV LEAD AMPLITUDE: 12 mv
RV LEAD THRESHOLD: 0.75 V

## 2013-01-14 NOTE — Patient Instructions (Addendum)
Remote monitoring is used to monitor your Pacemaker of ICD from home. This monitoring reduces the number of office visits required to check your device to one time per year. It allows us to keep an eye on the functioning of your device to ensure it is working properly. You are scheduled for a device check from home on April 15, 2013. You may send your transmission at any time that day. If you have a wireless device, the transmission will be sent automatically. After your physician reviews your transmission, you will receive a postcard with your next transmission date.  Your physician wants you to follow-up in: 1 year with Dr Allred.  You will receive a reminder letter in the mail two months in advance. If you don't receive a letter, please call our office to schedule the follow-up appointment.   

## 2013-01-14 NOTE — Progress Notes (Signed)
PCP: Coralee Rud, PA-C   Brian Davidson is a 77 y.o. male who presents today for routine electrophysiology followup.  Since last being seen in our clinic, the patient reports doing very well.  Today, he denies symptoms of palpitations, chest pain, shortness of breath,  lower extremity edema, dizziness, presyncope, or syncope.  The patient is otherwise without complaint today.   At last visit in September, his Diltiazem was increased to 240mg  daily for elevated ventricular rates.  He has tolerated this without difficulty.    His INR is followed by Arnette Felts at [facility].  They have been discussing changing to a NOAC.  He has follow up scheduled for later this month.   Past Medical History  Diagnosis Date  . Hypertension   . Atrial fibrillation with slow ventricular response     long standing persistent atrial fibrillation  . Heart murmur   . Pneumonia 1992  . Arthritis   . Chronic anticoagulation   . Tachycardia-bradycardia     s/p PPM   Past Surgical History  Procedure Laterality Date  . Fracture surgery  2005    left ankle  . Pacemaker insertion  01/31/12    SJM Accent SR RF implanted by DR Allred    Current Outpatient Prescriptions  Medication Sig Dispense Refill  . diltiazem (CARDIZEM CD) 240 MG 24 hr capsule Take 1 capsule (240 mg total) by mouth daily.  90 capsule  3  . irbesartan-hydrochlorothiazide (AVALIDE) 150-12.5 MG per tablet Take 1 tablet by mouth daily.      Marland Kitchen loratadine (CLARITIN) 10 MG tablet Take 10 mg by mouth daily as needed. For seasonal allergies      . metoprolol succinate (TOPROL-XL) 100 MG 24 hr tablet Take 100 mg by mouth 2 (two) times daily. Take with or immediately following a meal.      . warfarin (COUMADIN) 5 MG tablet Take 5-7.5 mg by mouth daily. Sunday and Tuesday 7.5mg ;  and the rest of the week 5mg         Physical Exam: Filed Vitals:   01/14/13 1030  BP: 146/66  Pulse: 77  Height: 6' (1.829 m)  Weight: 223 lb (101.152  kg)  SpO2: 98%    GEN- The patient is well appearing, alert and oriented x 3 today.   Head- normocephalic, atraumatic Eyes-  Sclera clear, conjunctiva pink Ears- hearing intact Oropharynx- clear Lungs- Clear to ausculation bilaterally, normal work of breathing Chest- pacemaker pocket is well healed Heart- Regular rate and rhythm, no murmurs, rubs or gallops, PMI not laterally displaced GI- soft, NT, ND, + BS Extremities- no clubbing, cyanosis, or edema  Pacemaker interrogation- reviewed in detail today,  See PACEART report  Assessment and Plan:  1. Tachy-brady syndrome Ventricular rates better controlled.  Normal pacemaker function See Pace Art report No changes today   2. afib Continue long term anticoagulation He is contemplating xarelto under the advice of Arnette Felts.  I think this is a very good idea  Return to the device clinic in a year

## 2013-01-31 ENCOUNTER — Other Ambulatory Visit: Payer: Self-pay | Admitting: Emergency Medicine

## 2013-01-31 ENCOUNTER — Telehealth: Payer: Self-pay | Admitting: Internal Medicine

## 2013-01-31 MED ORDER — DILTIAZEM HCL ER COATED BEADS 240 MG PO CP24: 240.0000 mg | ORAL_CAPSULE | Freq: Every day | ORAL | Status: DC

## 2013-01-31 MED ORDER — RIVAROXABAN 20 MG PO TABS: 20.0000 mg | ORAL_TABLET | Freq: Every day | ORAL | Status: DC

## 2013-01-31 NOTE — Telephone Encounter (Signed)
Ok to take, his Warfarin was d/c'd by Legrand Como and he was started on Xarelto 20mg  daily

## 2013-01-31 NOTE — Telephone Encounter (Signed)
New Prob    Pt would like to know if its ok to take CARDIZEM & XARELTO together. Would like to speak to nurse.

## 2013-04-15 ENCOUNTER — Ambulatory Visit (INDEPENDENT_AMBULATORY_CARE_PROVIDER_SITE_OTHER): Payer: Medicare Other | Admitting: *Deleted

## 2013-04-15 ENCOUNTER — Encounter: Payer: Self-pay | Admitting: Internal Medicine

## 2013-04-15 DIAGNOSIS — Z950 Presence of cardiac pacemaker: Secondary | ICD-10-CM

## 2013-04-15 DIAGNOSIS — I495 Sick sinus syndrome: Secondary | ICD-10-CM

## 2013-04-17 LAB — REMOTE PACEMAKER DEVICE
BATTERY VOLTAGE: 2.98 V
BMOD-0002RV: 10
BRDY-0002RV: 60 {beats}/min
BRDY-0004RV: 130 {beats}/min
DEVICE MODEL PM: 7328888

## 2013-05-08 ENCOUNTER — Encounter: Payer: Self-pay | Admitting: *Deleted

## 2013-07-15 ENCOUNTER — Encounter: Payer: Medicare Other | Admitting: *Deleted

## 2013-07-15 DIAGNOSIS — I495 Sick sinus syndrome: Secondary | ICD-10-CM

## 2013-07-15 DIAGNOSIS — Z950 Presence of cardiac pacemaker: Secondary | ICD-10-CM

## 2013-07-15 LAB — MDC_IDC_ENUM_SESS_TYPE_REMOTE
Implantable Pulse Generator Serial Number: 7328888
Lead Channel Impedance Value: 610 Ohm
Lead Channel Sensing Intrinsic Amplitude: 12 mV
Lead Channel Setting Pacing Amplitude: 2.5 V
Lead Channel Setting Pacing Pulse Width: 0.4 ms
Lead Channel Setting Sensing Sensitivity: 2 mV

## 2013-07-23 ENCOUNTER — Other Ambulatory Visit: Payer: Self-pay | Admitting: Internal Medicine

## 2013-07-29 ENCOUNTER — Encounter: Payer: Self-pay | Admitting: *Deleted

## 2013-07-31 ENCOUNTER — Encounter: Payer: Self-pay | Admitting: Internal Medicine

## 2013-10-16 ENCOUNTER — Ambulatory Visit (INDEPENDENT_AMBULATORY_CARE_PROVIDER_SITE_OTHER): Payer: Medicare Other | Admitting: *Deleted

## 2013-10-16 ENCOUNTER — Encounter: Payer: Self-pay | Admitting: Internal Medicine

## 2013-10-16 DIAGNOSIS — I495 Sick sinus syndrome: Secondary | ICD-10-CM

## 2013-10-16 DIAGNOSIS — I4821 Permanent atrial fibrillation: Secondary | ICD-10-CM

## 2013-10-16 DIAGNOSIS — I4891 Unspecified atrial fibrillation: Secondary | ICD-10-CM

## 2013-10-23 LAB — MDC_IDC_ENUM_SESS_TYPE_REMOTE
Implantable Pulse Generator Model: 1210
Implantable Pulse Generator Serial Number: 7328888
Lead Channel Setting Pacing Amplitude: 2.5 V
Lead Channel Setting Pacing Pulse Width: 0.4 ms
Lead Channel Setting Sensing Sensitivity: 2 mV
MDC IDC MSMT LEADCHNL RV IMPEDANCE VALUE: 640 Ohm
MDC IDC MSMT LEADCHNL RV SENSING INTR AMPL: 11.8 mV
MDC IDC STAT BRADY RV PERCENT PACED: 16 %

## 2013-11-04 ENCOUNTER — Encounter: Payer: Self-pay | Admitting: *Deleted

## 2014-01-22 ENCOUNTER — Encounter (INDEPENDENT_AMBULATORY_CARE_PROVIDER_SITE_OTHER): Payer: Self-pay

## 2014-01-22 ENCOUNTER — Encounter: Payer: Self-pay | Admitting: Internal Medicine

## 2014-01-22 ENCOUNTER — Ambulatory Visit (INDEPENDENT_AMBULATORY_CARE_PROVIDER_SITE_OTHER): Payer: Medicare Other | Admitting: Internal Medicine

## 2014-01-22 VITALS — BP 138/78 | HR 68 | Ht 72.0 in | Wt 221.0 lb

## 2014-01-22 DIAGNOSIS — I495 Sick sinus syndrome: Secondary | ICD-10-CM

## 2014-01-22 DIAGNOSIS — I10 Essential (primary) hypertension: Secondary | ICD-10-CM

## 2014-01-22 DIAGNOSIS — I4891 Unspecified atrial fibrillation: Secondary | ICD-10-CM

## 2014-01-22 DIAGNOSIS — I4821 Permanent atrial fibrillation: Secondary | ICD-10-CM

## 2014-01-22 LAB — MDC_IDC_ENUM_SESS_TYPE_INCLINIC
Battery Remaining Longevity: 138 mo
Battery Voltage: 2.98 V
Date Time Interrogation Session: 20150520113406
Implantable Pulse Generator Model: 1210
Lead Channel Pacing Threshold Amplitude: 1 V
Lead Channel Sensing Intrinsic Amplitude: 12 mV
Lead Channel Setting Pacing Pulse Width: 0.4 ms
Lead Channel Setting Sensing Sensitivity: 2 mV
MDC IDC MSMT LEADCHNL RV IMPEDANCE VALUE: 637.5 Ohm
MDC IDC MSMT LEADCHNL RV PACING THRESHOLD PULSEWIDTH: 0.4 ms
MDC IDC PG SERIAL: 7328888
MDC IDC SET LEADCHNL RV PACING AMPLITUDE: 2.5 V
MDC IDC STAT BRADY RV PERCENT PACED: 15 %

## 2014-01-22 NOTE — Progress Notes (Signed)
PCP: Roque Cash Primary Cardiologist:  Dr Lavone Orn is a 78 y.o. male who presents today for routine electrophysiology followup.  Since his last visit, the patient reports doing very well.  His exercise tolerance and leg fatigue remain improved.  Today, he denies symptoms of palpitations, chest pain, shortness of breath,  lower extremity edema (above baseline), dizziness, presyncope, or syncope.  The patient is otherwise without complaint today.   Past Medical History  Diagnosis Date  . Hypertension   . Atrial fibrillation with slow ventricular response     long standing persistent atrial fibrillation  . Heart murmur   . Pneumonia 1992  . Arthritis   . Chronic anticoagulation   . Tachycardia-bradycardia     s/p PPM   Past Surgical History  Procedure Laterality Date  . Fracture surgery  2005    left ankle  . Pacemaker insertion  01/31/12    SJM Accent SR RF implanted by DR Coner Gibbard    Current Outpatient Prescriptions  Medication Sig Dispense Refill  . diltiazem (CARDIZEM CD) 240 MG 24 hr capsule Take 1 capsule (240 mg total) by mouth daily.  90 capsule  3  . irbesartan-hydrochlorothiazide (AVALIDE) 150-12.5 MG per tablet Take 1 tablet by mouth daily.      Marland Kitchen loratadine (CLARITIN) 10 MG tablet Take 10 mg by mouth daily as needed. For seasonal allergies      . metoprolol succinate (TOPROL-XL) 100 MG 24 hr tablet Take 100 mg by mouth 2 (two) times daily. Take with or immediately following a meal.      . Rivaroxaban (XARELTO) 20 MG TABS Take 1 tablet (20 mg total) by mouth daily.  30 tablet     No current facility-administered medications for this visit.    Physical Exam: Filed Vitals:   01/22/14 1024  BP: 138/78  Pulse: 68  Height: 6' (1.829 m)  Weight: 221 lb (100.245 kg)    GEN- The patient is well appearing, alert and oriented x 3 today.   Head- normocephalic, atraumatic Eyes-  Sclera clear, conjunctiva pink Ears- hearing intact Oropharynx- clear Lungs-  Clear to ausculation bilaterally, normal work of breathing Heart- irrgular rate and rhythm, no murmurs, rubs or gallops, PMI not laterally displaced GI- soft, NT, ND, + BS Extremities- no clubbing, cyanosis, or edema Pacemaker pocket is well healed  Device interrogation is reviewed today and is normal  Assessment and Plan:  1. Tachycardia/ bradycardia syndrome Normal pacemaker function See Pace Art report No changes today  2. Permanent afib Continue xarelto for stroke prevention chads2vasc score is at least 3  3. htn Stable No change required today  Merlin remote device follow-up through our office Return to see me in 1 year

## 2014-01-22 NOTE — Patient Instructions (Signed)
Remote monitoring is used to monitor your Pacemaker or ICD from home. This monitoring reduces the number of office visits required to check your device to one time per year. It allows [place] to keep an eye on the functioning of your device to ensure it is working properly. You are scheduled for a device check from home on 04/28/14. You may send your transmission at any time that day. If you have a wireless device, the transmission will be sent automatically. After your physician reviews your transmission, you will receive a postcard with your next transmission date.    Your physician wants you to follow-up in: 12 months with Dr Vallery Ridge will receive a reminder letter in the mail two months in advance. If you don't receive a letter, please call our office to schedule the follow-up appointment.

## 2014-01-24 ENCOUNTER — Encounter: Payer: Self-pay | Admitting: Internal Medicine

## 2014-04-28 ENCOUNTER — Ambulatory Visit (INDEPENDENT_AMBULATORY_CARE_PROVIDER_SITE_OTHER): Payer: Medicare Other | Admitting: *Deleted

## 2014-04-28 ENCOUNTER — Encounter: Payer: Self-pay | Admitting: Internal Medicine

## 2014-04-28 DIAGNOSIS — I495 Sick sinus syndrome: Secondary | ICD-10-CM

## 2014-04-28 DIAGNOSIS — I4821 Permanent atrial fibrillation: Secondary | ICD-10-CM

## 2014-04-28 DIAGNOSIS — I4891 Unspecified atrial fibrillation: Secondary | ICD-10-CM

## 2014-04-28 LAB — MDC_IDC_ENUM_SESS_TYPE_REMOTE
Battery Remaining Longevity: 122 mo
Battery Voltage: 2.98 V
Brady Statistic RV Percent Paced: 16 %
Implantable Pulse Generator Model: 1210
Implantable Pulse Generator Serial Number: 7328888
Lead Channel Sensing Intrinsic Amplitude: 12 mV
Lead Channel Setting Pacing Amplitude: 2.5 V
Lead Channel Setting Pacing Pulse Width: 0.4 ms
MDC IDC MSMT BATTERY REMAINING PERCENTAGE: 89 %
MDC IDC MSMT LEADCHNL RV IMPEDANCE VALUE: 630 Ohm
MDC IDC MSMT LEADCHNL RV PACING THRESHOLD AMPLITUDE: 1 V
MDC IDC MSMT LEADCHNL RV PACING THRESHOLD PULSEWIDTH: 0.4 ms
MDC IDC SESS DTM: 20150824072814
MDC IDC SET LEADCHNL RV SENSING SENSITIVITY: 2 mV

## 2014-04-28 NOTE — Progress Notes (Signed)
Remote pacemaker transmission.   

## 2014-05-06 ENCOUNTER — Encounter: Payer: Self-pay | Admitting: Cardiology

## 2014-05-10 DIAGNOSIS — M1710 Unilateral primary osteoarthritis, unspecified knee: Secondary | ICD-10-CM | POA: Insufficient documentation

## 2014-05-10 DIAGNOSIS — M25569 Pain in unspecified knee: Secondary | ICD-10-CM | POA: Insufficient documentation

## 2014-05-10 DIAGNOSIS — M179 Osteoarthritis of knee, unspecified: Secondary | ICD-10-CM | POA: Insufficient documentation

## 2014-05-10 DIAGNOSIS — I4891 Unspecified atrial fibrillation: Secondary | ICD-10-CM | POA: Insufficient documentation

## 2014-07-30 ENCOUNTER — Ambulatory Visit (INDEPENDENT_AMBULATORY_CARE_PROVIDER_SITE_OTHER): Payer: Medicare Other | Admitting: *Deleted

## 2014-07-30 DIAGNOSIS — I495 Sick sinus syndrome: Secondary | ICD-10-CM

## 2014-07-30 NOTE — Progress Notes (Signed)
Remote pacemaker transmission.   

## 2014-08-02 LAB — MDC_IDC_ENUM_SESS_TYPE_REMOTE
Battery Remaining Percentage: 88 %
Battery Voltage: 2.98 V
Brady Statistic RV Percent Paced: 20 %
Lead Channel Impedance Value: 640 Ohm
Lead Channel Setting Pacing Amplitude: 2.5 V
Lead Channel Setting Pacing Pulse Width: 0.4 ms
Lead Channel Setting Sensing Sensitivity: 2 mV
MDC IDC MSMT BATTERY REMAINING LONGEVITY: 121 mo
MDC IDC MSMT LEADCHNL RV PACING THRESHOLD AMPLITUDE: 1 V
MDC IDC MSMT LEADCHNL RV PACING THRESHOLD PULSEWIDTH: 0.4 ms
MDC IDC MSMT LEADCHNL RV SENSING INTR AMPL: 12 mV
MDC IDC PG SERIAL: 7328888
MDC IDC SESS DTM: 20151125080045

## 2014-08-12 ENCOUNTER — Encounter: Payer: Self-pay | Admitting: Cardiology

## 2014-08-14 ENCOUNTER — Encounter (HOSPITAL_COMMUNITY): Payer: Self-pay | Admitting: Internal Medicine

## 2014-08-19 ENCOUNTER — Encounter: Payer: Self-pay | Admitting: Internal Medicine

## 2014-09-05 HISTORY — PX: CYSTOSCOPY: SUR368

## 2014-11-03 ENCOUNTER — Ambulatory Visit (INDEPENDENT_AMBULATORY_CARE_PROVIDER_SITE_OTHER): Payer: Medicare Other | Admitting: *Deleted

## 2014-11-03 DIAGNOSIS — I495 Sick sinus syndrome: Secondary | ICD-10-CM

## 2014-11-03 NOTE — Progress Notes (Signed)
Remote pacemaker transmission.   

## 2014-11-09 LAB — MDC_IDC_ENUM_SESS_TYPE_REMOTE
Battery Remaining Longevity: 119 mo
Battery Remaining Percentage: 87 %
Brady Statistic RV Percent Paced: 22 %
Date Time Interrogation Session: 20160229083047
Implantable Pulse Generator Model: 1210
Lead Channel Impedance Value: 630 Ohm
Lead Channel Setting Sensing Sensitivity: 2 mV
MDC IDC MSMT BATTERY VOLTAGE: 2.98 V
MDC IDC MSMT LEADCHNL RV PACING THRESHOLD AMPLITUDE: 1 V
MDC IDC MSMT LEADCHNL RV PACING THRESHOLD PULSEWIDTH: 0.4 ms
MDC IDC MSMT LEADCHNL RV SENSING INTR AMPL: 12 mV
MDC IDC PG SERIAL: 7328888
MDC IDC SET LEADCHNL RV PACING AMPLITUDE: 2.5 V
MDC IDC SET LEADCHNL RV PACING PULSEWIDTH: 0.4 ms

## 2014-11-14 ENCOUNTER — Emergency Department (HOSPITAL_BASED_OUTPATIENT_CLINIC_OR_DEPARTMENT_OTHER): Payer: Medicare Other

## 2014-11-14 ENCOUNTER — Emergency Department (HOSPITAL_BASED_OUTPATIENT_CLINIC_OR_DEPARTMENT_OTHER)
Admission: EM | Admit: 2014-11-14 | Discharge: 2014-11-14 | Disposition: A | Discharge status: Home / Self Care | Payer: Medicare Other | Attending: Emergency Medicine | Admitting: Emergency Medicine

## 2014-11-14 ENCOUNTER — Encounter (HOSPITAL_BASED_OUTPATIENT_CLINIC_OR_DEPARTMENT_OTHER): Payer: Self-pay | Admitting: Emergency Medicine

## 2014-11-14 DIAGNOSIS — Z79899 Other long term (current) drug therapy: Secondary | ICD-10-CM | POA: Diagnosis not present

## 2014-11-14 DIAGNOSIS — Z87891 Personal history of nicotine dependence: Secondary | ICD-10-CM | POA: Diagnosis not present

## 2014-11-14 DIAGNOSIS — Z23 Encounter for immunization: Secondary | ICD-10-CM | POA: Insufficient documentation

## 2014-11-14 DIAGNOSIS — W01198A Fall on same level from slipping, tripping and stumbling with subsequent striking against other object, initial encounter: Secondary | ICD-10-CM | POA: Insufficient documentation

## 2014-11-14 DIAGNOSIS — Y9389 Activity, other specified: Secondary | ICD-10-CM | POA: Insufficient documentation

## 2014-11-14 DIAGNOSIS — I10 Essential (primary) hypertension: Secondary | ICD-10-CM | POA: Diagnosis not present

## 2014-11-14 DIAGNOSIS — Z8701 Personal history of pneumonia (recurrent): Secondary | ICD-10-CM | POA: Insufficient documentation

## 2014-11-14 DIAGNOSIS — Z950 Presence of cardiac pacemaker: Secondary | ICD-10-CM | POA: Insufficient documentation

## 2014-11-14 DIAGNOSIS — S0081XA Abrasion of other part of head, initial encounter: Secondary | ICD-10-CM | POA: Insufficient documentation

## 2014-11-14 DIAGNOSIS — W19XXXA Unspecified fall, initial encounter: Secondary | ICD-10-CM

## 2014-11-14 DIAGNOSIS — Z8739 Personal history of other diseases of the musculoskeletal system and connective tissue: Secondary | ICD-10-CM | POA: Insufficient documentation

## 2014-11-14 DIAGNOSIS — Z7901 Long term (current) use of anticoagulants: Secondary | ICD-10-CM | POA: Insufficient documentation

## 2014-11-14 DIAGNOSIS — S0990XA Unspecified injury of head, initial encounter: Secondary | ICD-10-CM | POA: Diagnosis present

## 2014-11-14 DIAGNOSIS — Y998 Other external cause status: Secondary | ICD-10-CM | POA: Insufficient documentation

## 2014-11-14 DIAGNOSIS — Y9289 Other specified places as the place of occurrence of the external cause: Secondary | ICD-10-CM | POA: Diagnosis not present

## 2014-11-14 DIAGNOSIS — I4891 Unspecified atrial fibrillation: Secondary | ICD-10-CM | POA: Diagnosis not present

## 2014-11-14 MED ORDER — TETANUS-DIPHTH-ACELL PERTUSSIS 5-2.5-18.5 LF-MCG/0.5 IM SUSP
0.5000 mL | Freq: Once | INTRAMUSCULAR | Status: AC
  Administered 2014-11-14: 0.5 mL via INTRAMUSCULAR
  Filled 2014-11-14: qty 0.5

## 2014-11-14 NOTE — ED Provider Notes (Signed)
CSN: [id number]     Arrival date & time 11/14/14  1025 History   First MD Initiated Contact with Patient 11/14/14 1047     Chief Complaint  Patient presents with  . Head Injury    fall     (Consider location/radiation/quality/duration/timing/severity/associated sxs/prior Treatment) HPI  79 year old man history of atrial fibrillation on Xarelto who was outside today when he tripped on the curb and struck his head. He did not lose consciousness. He was able to get up on his own and walk into the house. He has pain at the site of the abrasions and swelling around his face but no headache, lateralized weakness, nausea, vomiting, or alteration in mental status. He denies any other injury. He denies neck pain, numbness, or tingling.  Past Medical History  Diagnosis Date  . Hypertension   . Atrial fibrillation with slow ventricular response     long standing persistent atrial fibrillation  . Heart murmur   . Pneumonia 1992  . Arthritis   . Chronic anticoagulation   . Tachycardia-bradycardia     s/p PPM   Past Surgical History  Procedure Laterality Date  . Fracture surgery  2005    left ankle  . Pacemaker insertion  01/31/12    SJM Accent SR RF implanted by DR Allred  . Permanent pacemaker insertion N/A 01/31/2012    Procedure: PERMANENT PACEMAKER INSERTION;  Surgeon: Thompson Grayer, MD;  Location: [facility] CATH LAB;  Service: Cardiovascular;  Laterality: N/A;   Family History  Problem Relation Age of Onset  . Hypertension     History  Substance Use Topics  . Smoking status: Former Smoker -- 10 years    Types: Pipe, Cigars  . Smokeless tobacco: Never Used  . Alcohol Use: 1.8 oz/week    3 Cans of beer per week    Review of Systems  All other systems reviewed and are negative.     Allergies  Sulfa antibiotics  Home Medications   Prior to Admission medications   Medication Sig Start Date End Date Taking? Authorizing Provider  irbesartan-hydrochlorothiazide (AVALIDE) 150-12.5  MG per tablet Take 1 tablet by mouth daily.   Yes Historical Provider, MD  loratadine (CLARITIN) 10 MG tablet Take 10 mg by mouth daily as needed. For seasonal allergies   Yes Historical Provider, MD  metoprolol succinate (TOPROL-XL) 100 MG 24 hr tablet Take 100 mg by mouth 2 (two) times daily. Take with or immediately following a meal.   Yes Historical Provider, MD  Rivaroxaban (XARELTO) 20 MG TABS Take 1 tablet (20 mg total) by mouth daily. 01/31/13  Yes Thompson Grayer, MD  diltiazem (CARDIZEM CD) 240 MG 24 hr capsule Take 1 capsule (240 mg total) by mouth daily. 01/31/13 01/31/14  Deboraha Sprang, MD   BP 141/86 mmHg  Pulse 78  Temp(Src) 98 F (36.7 C) (Oral)  Resp 18  Ht 6' (1.829 m)  Wt 215 lb (97.523 kg)  BMI 29.15 kg/m2  SpO2 97% Physical Exam  Constitutional: He is oriented to person, place, and time. He appears well-developed and well-nourished.  HENT:  Head: Normocephalic. Head is with abrasion.    Right Ear: External ear normal.  Left Ear: External ear normal.  Nose: Nose normal.  Mouth/Throat: Oropharynx is clear and moist.  Eyes: Conjunctivae and EOM are normal. Pupils are equal, round, and reactive to light.  Neck: Normal range of motion. Neck supple.  Cardiovascular: An irregularly irregular rhythm present.  Pulmonary/Chest: Effort normal and breath sounds normal.  Abdominal:  Soft. Bowel sounds are normal. There is no tenderness.  Musculoskeletal: Normal range of motion. He exhibits no tenderness.  No cervical, thoracic, or lumbar tenderness to palpation  Neurological: He is alert and oriented to person, place, and time. He displays normal reflexes. No cranial nerve deficit. He exhibits normal muscle tone. Coordination normal.  Skin: Skin is warm.  Psychiatric: He has a normal mood and affect. His behavior is normal. Judgment and thought content normal.  Nursing note and vitals reviewed.   ED Course  Procedures (including critical care time) Labs Review Labs Reviewed  - No data to display  Imaging Review Ct Head Wo Contrast  11/14/2014   CLINICAL DATA:  Stepped off the edge of the sidewalk today at home, fell hitting the left eye  EXAM: CT HEAD WITHOUT CONTRAST  TECHNIQUE: Contiguous axial images were obtained from the base of the skull through the vertex without intravenous contrast.  COMPARISON:  None.  FINDINGS: There is no evidence of mass effect, midline shift, or extra-axial fluid collections. There is no evidence of a space-occupying lesion or intracranial hemorrhage. There is no evidence of a cortical-based area of acute infarction. There is generalized cerebral atrophy. There is periventricular white matter low attenuation likely secondary to microangiopathy.  The ventricles and sulci are appropriate for the patient's age. The basal cisterns are patent.  Visualized portions of the orbits are unremarkable. The visualized portions of the paranasal sinuses and mastoid air cells are unremarkable. Cerebrovascular atherosclerotic calcifications are noted.  The osseous structures are unremarkable. There is a large left frontal scalp hematoma.  IMPRESSION: 1. No acute intracranial pathology. 2. Chronic microvascular disease and cerebral atrophy. 3. Large left frontal scalp hematoma.   Electronically Signed   By: Kathreen Devoid   On: 11/14/2014 11:41     EKG Interpretation None      MDM   Final diagnoses:  Fall  Facial abrasion, initial encounter    79 year old man who is on Xarelto fell and struck his head. A CT here reveals no evidence of bleeding. His wife is here with him. They're both alert and highly cognitively functioning. I have discussed return precautions including headache, weakness, or altered mental status. They both voice understanding.  Pattricia Boss, MD 11/20/14 [phone]

## 2014-11-14 NOTE — ED Notes (Signed)
Abrasions to face cleansed with wound cleanser, bacitracin applied. Pt is awake and alert, smiling in nad.

## 2014-11-14 NOTE — Discharge Instructions (Signed)
Anticoagulation, Generic Anticoagulants are medicines used to prevent clots from developing in your veins. These medicine are also known as blood thinners. If blood clots are untreated, they could travel to your lungs. This is called a pulmonary embolus. A blood clot in your lungs can be fatal.  Health care providers often use anticoagulants to prevent clots following surgery. Anticoagulants are also used along with aspirin when the heart is not getting enough blood. Another anticoagulant called warfarin is started 2 to 3 days after a rapid-acting injectable anticoagulant is started. The rapid-acting anticoagulants are usually continued until warfarin has begun to work. Your health care provider will judge this length of time by blood tests known as the prothrombin time (PT) and International Normalization Ratio (INR). This means that your blood is at the necessary and best level to prevent clots. RISKS AND COMPLICATIONS  If you have received recent epidural anesthesia, spinal anesthesia, or a spinal tap while receiving anticoagulants, you are at risk for developing a blood clot in or around the spine. This condition could result in long-term or permanent paralysis.  Because anticoagulants thin your blood, severe bleeding may occur from any tissue or organ. Symptoms of the blood being too thin may include:  Bleeding from the nose or gums that does not stop quickly.  Blood in bowel movements which may appear as bright red, dark, or black tarry stools.  Blood in the urine which may appear as pink, red, or brown urine.  Unusual bruising or bruising easily.  A cut that does not stop bleeding within 10 minutes.  Vomiting blood or continuous nausea for more than 1 day.  Coughing up blood.  Broken blood vessels in your eye (subconjunctival hemorrhage).  Abdominal or back pain with or without flank bruising.  Sudden, severe headache.  Sudden weakness or numbness of the face, arm, or leg,  especially on one side of the body.  Sudden confusion.  Trouble speaking (aphasia) or understanding.  Sudden trouble seeing in one or both eyes.  Sudden trouble walking.  Dizziness.  Loss of balance or coordination.  Vaginal bleeding.  Swelling or pain at an injection site.  Superficial fat tissue death (necrosis) which may cause skin scarring. This is more common in women and may first present as pain in the waist, thighs, or buttocks.  Fever.  Too little anticoagulation continues to allow the risk for blood clots. HOME CARE INSTRUCTIONS   Due to the complications of anticoagulants, it is very important that you take your anticoagulant as directed by your health care provider. Anticoagulants need to be taken exactly as instructed. Be sure you understand all your anticoagulant instructions.  Keep all follow-up appointments with your health care provider as directed. It is very important to keep your appointments. Not keeping appointments could result in a chronic or permanent injury, pain, or disability.  Warfarin. Your health care provider will advise you on the length of treatment (usually 3-6 months, sometimes lifelong).  Take warfarin exactly as directed by your health care provider. It is recommended that you take your warfarin dose at the same time of the day. It is preferred that you take warfarin in the late afternoon. If you have been told to stop taking warfarin, do not resume taking warfarin until directed to do so by your health care provider. Follow your health care provider's instructions if you accidentally take an extra dose or miss a dose of warfarin. It is very important to take warfarin as directed since bleeding or blood  clots could result in chronic or permanent injury, pain, or disability.  Too much and too little warfarin are both dangerous. Too much warfarin increases the risk of bleeding. Too little warfarin continues to allow the risk for blood clots. While  taking warfarin, you will need to have regular blood tests to measure your blood clotting time. These blood tests usually include both the prothrombin time (PT) and International Normalized Ratio (INR) tests. The PT and INR results allow your health care provider to adjust your dose of warfarin. The dose can change for many reasons. It is critically important that you have your PT and INR levels drawn exactly as directed. Your warfarin dose may stay the same or change depending on what the PT and INR results are. Be sure to follow up with your health care provider regarding your PT and INR test results and what your warfarin dosage should be.  Many medicines can interfere with warfarin and affect the PT and INR results. You must tell your health care provider about any and all medicines you take, this includes all vitamins and supplements. Ask your health care provider before taking these. Prescription and over-the-counter medicine consistency is critical to warfarin management. It is important that potential interactions are checked before you start a new medicine. Be especially cautious with aspirin and anti-inflammatory medicines. Ask your health care provider before taking these. Medicines such as antibiotics and acid-reducing medicine can interact with warfarin and can cause an increased warfarin effect. Warfarin can also interfere with the effectiveness of medicines you are taking. Do not take or discontinue any prescribed or over-the-counter medicine except on the advice of your health care provider or pharmacist.  Some vitamins, supplements, and herbal products interfere with the effectiveness of warfarin. Vitamin E may increase the anticoagulant effects of warfarin. Vitamin K may can cause warfarin to be less effective. Do not take or discontinue any vitamin, supplement, or herbal product except on the advice of your health care provider or pharmacist.  Eat what you normally eat and keep the vitamin K  content of your diet consistent. Avoid major changes in your diet, or notify your health care provider before changing your diet. Suddenly getting a lot more vitamin K could cause your blood to clot too quickly. A sudden decrease in vitamin K intake could cause your blood to clot too slowly. These changes in vitamin K intake could lead to dangerous blood clotsor to bleeding. To keep your vitamin K intake consistent, you must be aware of which foods contain moderate or high amounts of vitamin K. Some foods high in vitamin K include spinach, kale, broccoli, cabbage, greens, Brussels sprouts, asparagus, Bok [place]oy, coleslaw, parsley, and green tea. Arrange a visit with a dietitian to answer your questions.  If you have a loss of appetite or get the stomach flu (viral gastroenteritis), talk to your health care provider as soon as possible. A decrease in your normal vitamin K intake can make you more sensitive to your usual dose of warfarin.  Some medical conditions may increase your risk for bleeding while you are taking warfarin. A fever, diarrhea lasting more than a day, worsening heart failure, or worsening liver function are some medical conditions that could affect warfarin. Contact your health care provider if you have any of these medical conditions.  Alcohol can change the body's ability to handle warfarin. It is best to avoid alcoholic drinks or consume only very small amounts while taking warfarin. Notify your health care provider if  you change your alcohol intake. A sudden increase in alcohol use can increase your risk of bleeding. Chronic alcohol use can cause warfarin to be less effective.  Be careful not to cut yourself when using sharp objects or while shaving.  Inform all your health care providers and your dentist that you take an anticoagulant.  Limit physical activities or sports that could result in a fall or cause injury. Avoid contact sports.  Wear medical alert jewelry or carry a  medical alert card. SEEK IMMEDIATE MEDICAL CARE IF:  You cough up blood.  You have dark or black stools or there is bright red blood coming from your rectum.  You vomit blood or have nausea for more than 1 day.  You have blood in the urine or pink colored urine.  You have unusual bruising or have increased bruising.  You have bleeding from the nose or gums that does not stop quickly.  You have a cut that does not stop bleeding within a 2-3 minutes.  You have sudden weakness or numbness of the face, arm, or leg, especially on one side of the body.  You have sudden confusion.  You have trouble speaking (aphasia) or understanding.  You have sudden trouble seeing in one or both eyes.  You have sudden trouble walking.  You have dizziness.  You have a loss of balance or coordination.  You have a sudden, severe headache.  You have a serious fall or head injury, even if you are not bleeding.  You have swelling or pain at an injection site.  You have unexplained tenderness or pain in the abdomen, back, waist, thighs or buttocks.  You have a fever. Any of these symptoms may represent a serious problem that is an emergency. Do not wait to see if the symptoms will go away. Get medical help right away. Call your local emergency services (911 in [place] Do not drive yourself to the hospital. Document Released: 08/22/2005 Document Revised: 08/27/2013 Document Reviewed: 03/26/2008 [facility] Patient Information 2015 [place], [place]. This information is not intended to replace advice given to you by your health care provider. Make sure you discuss any questions you have with your health care provider. Head Injury You have received a head injury. It does not appear serious at this time. Headaches and vomiting are common following head injury. It should be easy to awaken from sleeping. Sometimes it is necessary for you to stay in the emergency department for a while for observation. Sometimes  admission to the hospital may be needed. After injuries such as yours, most problems occur within the first 24 hours, but side effects may occur up to 7-10 days after the injury. It is important for you to carefully monitor your condition and contact your health care provider or seek immediate medical care if there is a change in your condition. WHAT ARE THE TYPES OF HEAD INJURIES? Head injuries can be as minor as a bump. Some head injuries can be more severe. More severe head injuries include:  A jarring injury to the brain (concussion).  A bruise of the brain (contusion). This mean there is bleeding in the brain that can cause swelling.  A cracked skull (skull fracture).  Bleeding in the brain that collects, clots, and forms a bump (hematoma). WHAT CAUSES A HEAD INJURY? A serious head injury is most likely to happen to someone who is in a car wreck and is not wearing a seat belt. Other causes of major head injuries include bicycle or  motorcycle accidents, sports injuries, and falls. HOW ARE HEAD INJURIES DIAGNOSED? A complete history of the event leading to the injury and your current symptoms will be helpful in diagnosing head injuries. Many times, pictures of the brain, such as CT or MRI are needed to see the extent of the injury. Often, an overnight hospital stay is necessary for observation.  WHEN SHOULD I SEEK IMMEDIATE MEDICAL CARE?  You should get help right away if:  You have confusion or drowsiness.  You feel sick to your stomach (nauseous) or have continued, forceful vomiting.  You have dizziness or unsteadiness that is getting worse.  You have severe, continued headaches not relieved by medicine. Only take over-the-counter or prescription medicines for pain, fever, or discomfort as directed by your health care provider.  You do not have normal function of the arms or legs or are unable to walk.  You notice changes in the black spots in the center of the colored part of your  eye (pupil).  You have a clear or bloody fluid coming from your nose or ears.  You have a loss of vision. During the next 24 hours after the injury, you must stay with someone who can watch you for the warning signs. This person should contact local emergency services (911 in the [place] if you have seizures, you become unconscious, or you are unable to wake up. HOW CAN I PREVENT A HEAD INJURY IN THE FUTURE? The most important factor for preventing major head injuries is avoiding motor vehicle accidents. To minimize the potential for damage to your head, it is crucial to wear seat belts while riding in motor vehicles. Wearing helmets while bike riding and playing collision sports (like football) is also helpful. Also, avoiding dangerous activities around the house will further help reduce your risk of head injury.  WHEN CAN I RETURN TO NORMAL ACTIVITIES AND ATHLETICS? You should be reevaluated by your health care provider before returning to these activities. If you have any of the following symptoms, you should not return to activities or contact sports until 1 week after the symptoms have stopped:  Persistent headache.  Dizziness or vertigo.  Poor attention and concentration.  Confusion.  Memory problems.  Nausea or vomiting.  Fatigue or tire easily.  Irritability.  Intolerant of bright lights or loud noises.  Anxiety or depression.  Disturbed sleep. MAKE SURE YOU:   Understand these instructions.  Will watch your condition.  Will get help right away if you are not doing well or get worse. Document Released: 08/22/2005 Document Revised: 08/27/2013 Document Reviewed: 04/29/2013 [facility] Patient Information 2015 [place], [place]. This information is not intended to replace advice given to you by your health care provider. Make sure you discuss any questions you have with your health care provider.

## 2014-11-14 NOTE — ED Notes (Signed)
Pt reports trip and fall onto concrete. Denies loc, hematoma noted to left temple area, with abrasions noted, no active bleeding. Pt is awake and alert in nad.

## 2014-11-14 NOTE — ED Notes (Signed)
Tripped on curb while stepping off. Hit above left eye on concrete. Denies LOC.

## 2014-11-17 ENCOUNTER — Encounter: Payer: Self-pay | Admitting: Cardiology

## 2014-11-21 ENCOUNTER — Encounter: Payer: Self-pay | Admitting: Internal Medicine

## 2015-01-22 ENCOUNTER — Other Ambulatory Visit: Payer: Self-pay

## 2015-01-26 ENCOUNTER — Ambulatory Visit (INDEPENDENT_AMBULATORY_CARE_PROVIDER_SITE_OTHER): Payer: Medicare Other | Admitting: Internal Medicine

## 2015-01-26 ENCOUNTER — Encounter: Payer: Self-pay | Admitting: Internal Medicine

## 2015-01-26 VITALS — BP 138/82 | HR 71 | Ht 72.0 in | Wt 202.8 lb

## 2015-01-26 DIAGNOSIS — I10 Essential (primary) hypertension: Secondary | ICD-10-CM

## 2015-01-26 DIAGNOSIS — I495 Sick sinus syndrome: Secondary | ICD-10-CM | POA: Diagnosis not present

## 2015-01-26 DIAGNOSIS — I482 Chronic atrial fibrillation: Secondary | ICD-10-CM

## 2015-01-26 DIAGNOSIS — Z950 Presence of cardiac pacemaker: Secondary | ICD-10-CM | POA: Diagnosis not present

## 2015-01-26 DIAGNOSIS — I4821 Permanent atrial fibrillation: Secondary | ICD-10-CM

## 2015-01-26 LAB — CUP PACEART INCLINIC DEVICE CHECK
Battery Remaining Longevity: 142.8 mo
Lead Channel Impedance Value: 650 Ohm
Lead Channel Pacing Threshold Pulse Width: 0.4 ms
Lead Channel Setting Pacing Amplitude: 0.875
Lead Channel Setting Sensing Sensitivity: 2 mV
MDC IDC MSMT BATTERY VOLTAGE: 2.98 V
MDC IDC MSMT LEADCHNL RV PACING THRESHOLD AMPLITUDE: 0.625 V
MDC IDC MSMT LEADCHNL RV PACING THRESHOLD AMPLITUDE: 0.75 V
MDC IDC MSMT LEADCHNL RV PACING THRESHOLD PULSEWIDTH: 0.4 ms
MDC IDC MSMT LEADCHNL RV SENSING INTR AMPL: 12 mV
MDC IDC SESS DTM: 20160523151158
MDC IDC SET LEADCHNL RV PACING PULSEWIDTH: 0.4 ms
MDC IDC STAT BRADY RV PERCENT PACED: 21 %
Pulse Gen Model: 1210
Pulse Gen Serial Number: 7328888

## 2015-01-26 NOTE — Patient Instructions (Signed)
Medication Instructions:  Your physician recommends that you continue on your current medications as directed. Please refer to the Current Medication list given to you today.   Labwork: None ordered  Testing/Procedures: None ordered  Follow-Up: Your physician wants you to follow-up in: 12 months with Amber Seiler, NP You will receive a reminder letter in the mail two months in advance. If you don't receive a letter, please call our office to schedule the follow-up appointment.   Remote monitoring is used to monitor your Pacemaker or ICD from home. This monitoring reduces the number of office visits required to check your device to one time per year. It allows us to keep an eye on the functioning of your device to ensure it is working properly. You are scheduled for a device check from home on 04/27/15. You may send your transmission at any time that day. If you have a wireless device, the transmission will be sent automatically. After your physician reviews your transmission, you will receive a postcard with your next transmission date.    Any Other Special Instructions Will Be Listed Below (If Applicable).   

## 2015-01-26 NOTE — Progress Notes (Signed)
Electrophysiology Office Note   Date:  01/26/2015   ID:  Brian Davidson, DOB Feb 05, 1936, MRN [id number]  PCP:  Isaias Cowman, PA-C  Cardiologist:  Dr Claudie Leach Primary Electrophysiologist: Thompson Grayer, MD    Chief Complaint  Patient presents with  . Fatigue     History of Present Illness: Brian Davidson is a 79 y.o. male who presents today for electrophysiology evaluation.   Doing well.  No new concerns.   Dr Claudie Leach is following valves with serial echos.  Remains active.  Enjoys parttime job transporting/ driving cars.  Today, he denies symptoms of palpitations, chest pain, shortness of breath, orthopnea, PND, lower extremity edema, claudication, dizziness, presyncope, syncope, bleeding, or neurologic sequela. The patient is tolerating medications without difficulties and is otherwise without complaint today.    Past Medical History  Diagnosis Date  . Hypertension   . Atrial fibrillation with slow ventricular response     long standing persistent atrial fibrillation  . Heart murmur   . Pneumonia 1992  . Arthritis   . Chronic anticoagulation   . Tachycardia-bradycardia     s/p PPM   Past Surgical History  Procedure Laterality Date  . Fracture surgery  2005    left ankle  . Pacemaker insertion  01/31/12    SJM Accent SR RF implanted by DR Advik Weatherspoon  . Permanent pacemaker insertion N/A 01/31/2012    Procedure: PERMANENT PACEMAKER INSERTION;  Surgeon: Thompson Grayer, MD;  Location: [facility] CATH LAB;  Service: Cardiovascular;  Laterality: N/A;     Current Outpatient Prescriptions  Medication Sig Dispense Refill  . diltiazem (CARDIZEM CD) 240 MG 24 hr capsule Take 1 capsule (240 mg total) by mouth daily. 90 capsule 3  . irbesartan-hydrochlorothiazide (AVALIDE) 150-12.5 MG per tablet Take 0.5 tablets by mouth daily.     Marland Kitchen loratadine (CLARITIN) 10 MG tablet Take 10 mg by mouth daily as needed. For seasonal allergies    . metoprolol succinate (TOPROL-XL) 100 MG 24 hr tablet Take  100 mg by mouth 2 (two) times daily. Take with or immediately following a meal.    . Rivaroxaban (XARELTO) 20 MG TABS Take 1 tablet (20 mg total) by mouth daily. 30 tablet    No current facility-administered medications for this visit.    Allergies:   Cartia xt and Sulfa antibiotics   Social History:  The patient  reports that he has quit smoking. His smoking use included Pipe and Cigars. He has never used smokeless tobacco. He reports that he drinks about 1.8 oz of alcohol per week. He reports that he does not use illicit drugs.   Family History:  The patient's family history includes Hypertension in an other family member.    ROS:  Please see the history of present illness.   All other systems are reviewed and negative.    PHYSICAL EXAM: VS:  BP 138/82 mmHg  Pulse 71  Ht 6' (1.829 m)  Wt 91.989 kg (202 lb 12.8 oz)  BMI 27.50 kg/m2 , BMI Body mass index is 27.5 kg/(m^2). GEN: Well nourished, well developed, in no acute distress HEENT: normal Neck: no JVD, carotid bruits, or masses Cardiac: iRRR   Respiratory:  clear to auscultation bilaterally, normal work of breathing GI: soft, nontender, nondistended, + BS MS: no deformity or atrophy Skin: warm and dry, device pocket is well healed Neuro:  Strength and sensation are intact Psych: euthymic mood, full affect  EKG:  EKG is ordered today. The ekg ordered today shows afib, QRS  84 msec with demand V pacing  Device interrogation is reviewed today in detail.  See PaceArt for details.   Recent Labs: No results found for requested labs within last 365 days.    Lipid Panel     Component Value Date/Time   CHOL 157 01/31/2012 0545   TRIG 108 01/31/2012 0545   HDL 34* 01/31/2012 0545   CHOLHDL 4.6 01/31/2012 0545   VLDL 22 01/31/2012 0545   LDLCALC 101* 01/31/2012 0545     Wt Readings from Last 3 Encounters:  01/26/15 91.989 kg (202 lb 12.8 oz)  11/14/14 97.523 kg (215 lb)  01/22/14 100.245 kg (221 lb)       ASSESSMENT AND PLAN:  1.  Permanent afib chads2vasc score is at least 3 Continue xarelto and rate control  2. Tachy/brady syndrome Normal pacemaker function See Pace Art report No changes today  3. HTN Stable No change required today   Current medicines are reviewed at length with the patient today.   The patient does not have concerns regarding his medicines.  The following changes were made today:  none   Follow-up: merlin, return to see EP NP in 1 year  Signed, Thompson Grayer, MD  01/26/2015 11:38 AM     [facility] [address] [place] [address] [phone] (office) [phone] (fax)

## 2015-01-28 ENCOUNTER — Encounter: Payer: Self-pay | Admitting: Internal Medicine

## 2015-02-12 ENCOUNTER — Telehealth: Payer: Self-pay

## 2015-02-12 NOTE — Telephone Encounter (Signed)
Patient called for samples of xarelto 20 mg placed samples up front 

## 2015-03-30 DIAGNOSIS — N201 Calculus of ureter: Secondary | ICD-10-CM | POA: Insufficient documentation

## 2015-04-06 ENCOUNTER — Telehealth: Payer: Self-pay | Admitting: *Deleted

## 2015-04-06 NOTE — Telephone Encounter (Signed)
Xarelto samples placed at the front desk for patient. 

## 2015-04-07 DIAGNOSIS — R319 Hematuria, unspecified: Secondary | ICD-10-CM | POA: Insufficient documentation

## 2015-04-27 ENCOUNTER — Ambulatory Visit (INDEPENDENT_AMBULATORY_CARE_PROVIDER_SITE_OTHER): Payer: Medicare Other | Admitting: *Deleted

## 2015-04-27 ENCOUNTER — Encounter: Payer: Self-pay | Admitting: Internal Medicine

## 2015-04-27 DIAGNOSIS — I495 Sick sinus syndrome: Secondary | ICD-10-CM

## 2015-04-28 NOTE — Progress Notes (Signed)
Remote pacemaker transmission.   

## 2015-05-08 LAB — CUP PACEART REMOTE DEVICE CHECK
Battery Remaining Longevity: 111 mo
Battery Remaining Percentage: 95.5 %
Battery Voltage: 2.98 V
Date Time Interrogation Session: 20160822073427
Lead Channel Pacing Threshold Pulse Width: 0.4 ms
Lead Channel Setting Pacing Pulse Width: 0.4 ms
Lead Channel Setting Sensing Sensitivity: 2 mV
MDC IDC MSMT LEADCHNL RV IMPEDANCE VALUE: 640 Ohm
MDC IDC MSMT LEADCHNL RV PACING THRESHOLD AMPLITUDE: 0.625 V
MDC IDC MSMT LEADCHNL RV SENSING INTR AMPL: 12 mV
MDC IDC PG SERIAL: 7328888
MDC IDC SET LEADCHNL RV PACING AMPLITUDE: 5 V
MDC IDC STAT BRADY RV PERCENT PACED: 8.4 %

## 2015-05-27 ENCOUNTER — Ambulatory Visit (INDEPENDENT_AMBULATORY_CARE_PROVIDER_SITE_OTHER): Payer: Medicare Other | Admitting: *Deleted

## 2015-05-27 DIAGNOSIS — I4821 Permanent atrial fibrillation: Secondary | ICD-10-CM

## 2015-05-27 DIAGNOSIS — I482 Chronic atrial fibrillation: Secondary | ICD-10-CM

## 2015-05-27 LAB — CUP PACEART INCLINIC DEVICE CHECK
Battery Voltage: 2.98 V
Date Time Interrogation Session: 20160921120956
Lead Channel Impedance Value: 612.5 Ohm
Lead Channel Pacing Threshold Amplitude: 0.75 V
Lead Channel Pacing Threshold Amplitude: 0.75 V
Lead Channel Pacing Threshold Pulse Width: 0.4 ms
Lead Channel Setting Pacing Amplitude: 2.5 V
Lead Channel Setting Sensing Sensitivity: 2 mV
MDC IDC MSMT BATTERY REMAINING LONGEVITY: 134.4 mo
MDC IDC MSMT LEADCHNL RV PACING THRESHOLD PULSEWIDTH: 0.4 ms
MDC IDC MSMT LEADCHNL RV SENSING INTR AMPL: 12 mV
MDC IDC SET LEADCHNL RV PACING PULSEWIDTH: 0.4 ms
MDC IDC STAT BRADY RV PERCENT PACED: 8.3 %
Pulse Gen Serial Number: 7328888

## 2015-05-27 NOTE — Progress Notes (Signed)
Pacemaker check in clinic. Normal device function. Threshold, sensing, and impedance consistent with previous measurements. Device programmed to maximize longevity. Permanent AF + Xarelto. No high ventricular rates noted. RV autocap d/c'd due to high output mode--output fixed at 2.5V @ 0.28ms. Histogram distribution appropriate for patient activity level. Device programmed to optimize intrinsic conduction. Estimated longevity 7.2-11.2 years. Patient will follow up via Merlin on 12/22 and with AS in 01-2016.

## 2015-06-30 ENCOUNTER — Encounter: Payer: Self-pay | Admitting: Internal Medicine

## 2015-08-19 ENCOUNTER — Telehealth: Payer: Self-pay | Admitting: Internal Medicine

## 2015-08-19 ENCOUNTER — Encounter: Payer: Self-pay | Admitting: *Deleted

## 2015-08-19 ENCOUNTER — Telehealth: Payer: Self-pay | Admitting: *Deleted

## 2015-08-19 NOTE — Telephone Encounter (Signed)
Returning patients call for samples.  Informed patient samples were left at the front desk for him to pick up at his earliest convenience.

## 2015-08-19 NOTE — Telephone Encounter (Signed)
New message   Patient calling the office for samples of medication:   1.  What medication and dosage are you requesting samples for? Xarelto   2.  Are you currently out of this medication? Yes   Comments: Pt called states that he will be on this side of town today. Request to pick them up today

## 2015-08-27 ENCOUNTER — Ambulatory Visit (INDEPENDENT_AMBULATORY_CARE_PROVIDER_SITE_OTHER): Payer: Medicare Other | Admitting: *Deleted

## 2015-08-27 DIAGNOSIS — I495 Sick sinus syndrome: Secondary | ICD-10-CM | POA: Diagnosis not present

## 2015-08-27 NOTE — Progress Notes (Signed)
Remote pacemaker transmission.   

## 2015-09-01 LAB — CUP PACEART REMOTE DEVICE CHECK
Battery Remaining Longevity: 143 mo
Battery Remaining Percentage: 95.5 %
Battery Voltage: 2.98 V
Brady Statistic RV Percent Paced: 11 %
Date Time Interrogation Session: 20161222081433
Implantable Lead Implant Date: 20130528
Implantable Lead Location: 753860
Lead Channel Setting Pacing Amplitude: 2.5 V
Lead Channel Setting Pacing Pulse Width: 0.4 ms
Lead Channel Setting Sensing Sensitivity: 2 mV
MDC IDC LEAD MODEL: 1948
MDC IDC MSMT LEADCHNL RV IMPEDANCE VALUE: 630 Ohm
MDC IDC MSMT LEADCHNL RV SENSING INTR AMPL: 12 mV
MDC IDC PG SERIAL: 7328888
Pulse Gen Model: 1210

## 2015-09-03 ENCOUNTER — Encounter: Payer: Self-pay | Admitting: Cardiology

## 2015-09-24 ENCOUNTER — Ambulatory Visit (HOSPITAL_BASED_OUTPATIENT_CLINIC_OR_DEPARTMENT_OTHER)
Admission: RE | Admit: 2015-09-24 | Discharge: 2015-09-24 | Disposition: A | Discharge status: Home / Self Care | Payer: PPO | Source: Ambulatory Visit | Attending: Medical | Admitting: Medical

## 2015-09-24 ENCOUNTER — Encounter: Payer: Self-pay | Admitting: Medical

## 2015-09-24 ENCOUNTER — Ambulatory Visit (INDEPENDENT_AMBULATORY_CARE_PROVIDER_SITE_OTHER): Payer: PPO | Admitting: Medical

## 2015-09-24 ENCOUNTER — Telehealth: Payer: Self-pay | Admitting: Medical

## 2015-09-24 VITALS — BP 134/80 | HR 88 | Temp 97.5°F | Ht 72.0 in | Wt 223.4 lb

## 2015-09-24 DIAGNOSIS — N261 Atrophy of kidney (terminal): Secondary | ICD-10-CM | POA: Diagnosis not present

## 2015-09-24 DIAGNOSIS — R319 Hematuria, unspecified: Secondary | ICD-10-CM

## 2015-09-24 DIAGNOSIS — R1011 Right upper quadrant pain: Secondary | ICD-10-CM | POA: Diagnosis not present

## 2015-09-24 DIAGNOSIS — R109 Unspecified abdominal pain: Secondary | ICD-10-CM | POA: Diagnosis not present

## 2015-09-24 DIAGNOSIS — M546 Pain in thoracic spine: Secondary | ICD-10-CM | POA: Diagnosis not present

## 2015-09-24 LAB — POC URINALSYSI DIPSTICK (AUTOMATED)
Bilirubin, UA: NEGATIVE
Glucose, UA: NEGATIVE
KETONES UA: NEGATIVE
Leukocytes, UA: NEGATIVE
NITRITE UA: NEGATIVE
PH UA: 6
PROTEIN UA: NEGATIVE
Spec Grav, UA: 1.02
UROBILINOGEN UA: 0.2

## 2015-09-24 LAB — COMPREHENSIVE METABOLIC PANEL
ALT: 15 U/L (ref 0–53)
AST: 18 U/L (ref 0–37)
Albumin: 4 g/dL (ref 3.5–5.2)
Alkaline Phosphatase: 81 U/L (ref 39–117)
BUN: 25 mg/dL — AB (ref 6–23)
CHLORIDE: 104 meq/L (ref 96–112)
CO2: 26 mEq/L (ref 19–32)
Calcium: 9 mg/dL (ref 8.4–10.5)
Creatinine, Ser: 1.27 mg/dL (ref 0.40–1.50)
GFR: 58.29 mL/min — ABNORMAL LOW (ref >60.00)
Glucose, Bld: 92 mg/dL (ref 70–99)
POTASSIUM: 4.1 meq/L (ref 3.5–5.1)
SODIUM: 137 meq/L (ref 135–145)
Total Bilirubin: 0.7 mg/dL (ref 0.2–1.2)
Total Protein: 7.3 g/dL (ref 6.0–8.3)

## 2015-09-24 LAB — CBC WITH DIFFERENTIAL/PLATELET
Basophils Absolute: 0 10*3/uL (ref 0.0–0.1)
Basophils Relative: 0.7 % (ref 0.0–3.0)
EOS PCT: 1.5 % (ref 0.0–5.0)
Eosinophils Absolute: 0.1 10*3/uL (ref 0.0–0.7)
HCT: 47.5 % (ref 39.0–52.0)
Hemoglobin: 15.9 g/dL (ref 13.0–17.0)
LYMPHS ABS: 1.6 10*3/uL (ref 0.7–4.0)
Lymphocytes Relative: 23.8 % (ref 12.0–46.0)
MCHC: 33.3 g/dL (ref 30.0–36.0)
MCV: 88.2 fl (ref 78.0–100.0)
MONO ABS: 0.9 10*3/uL (ref 0.1–1.0)
Monocytes Relative: 13.3 % — ABNORMAL HIGH (ref 3.0–12.0)
NEUTROS ABS: 4 10*3/uL (ref 1.4–7.7)
Neutrophils Relative %: 60.7 % (ref 43.0–77.0)
Platelets: 160 10*3/uL (ref 150.0–400.0)
RBC: 5.39 Mil/uL (ref 4.22–5.81)
RDW: 13.9 % (ref 11.5–15.5)
WBC: 6.6 10*3/uL (ref 4.0–10.5)

## 2015-09-24 LAB — LIPASE: Lipase: 25 U/L (ref 11.0–59.0)

## 2015-09-24 LAB — AMYLASE: AMYLASE: 69 U/L (ref 27–131)

## 2015-09-24 MED ORDER — TRAMADOL HCL 50 MG PO TABS: 50.0000 mg | ORAL_TABLET | Freq: Four times a day (QID) | ORAL | Status: DC | PRN

## 2015-09-24 NOTE — Telephone Encounter (Signed)
I put labs in. Please check. Cbc cmp amylase, lipase stat

## 2015-09-24 NOTE — Telephone Encounter (Signed)
Will you schedule pt ct study tomorrow the 20th  in the am if possilbe.

## 2015-09-24 NOTE — Telephone Encounter (Signed)
Labs were entered correct 09/24/15.

## 2015-09-24 NOTE — Telephone Encounter (Signed)
Done. Stats picked up @1542 

## 2015-09-24 NOTE — Progress Notes (Signed)
Subjective:    Patient ID: Brian Davidson, male    DOB: 1936/05/11, 80 y.o.   MRN: [id number]  HPI   Pt in with some pain on rt side of his stomach. Pt states that over past month pain has been coming and going. Pt does not associate pain with eating. Over past month he was noting pain at night. Pt points to rt lower quadrant region pain. Pt speculates shingles. But he had shingles vaccine. No rash. Pt never had ultrasound in past before. Pt last ate 10 am today.   Over past month he would get out of bed. Pain would last about 15 minutes then go away. Yesterday he woke up and had constant pain. Pain 3-4/10 mostly all day yesterday. But when walking pain subsided. But last night severe pain 1 am- 7 am. Then this am the pain level decreased again to lower level like 3. Pt after eating did not get nausea. No vomiting.   No fevers,no chills or sweats. Pt had kidney stone in past and states this does not feel like a stone. Pain radiates abdomen rt side toward back.   Pt had one beer 4 days ago. At most 4 beers a week.   Review of Systems  Constitutional: Negative for fever, chills and fatigue.  Gastrointestinal: Positive for abdominal pain. Negative for nausea, vomiting, diarrhea, constipation, blood in stool and rectal pain.       See hpi.  Genitourinary: Negative for urgency, frequency, hematuria, flank pain, enuresis and difficulty urinating.  Musculoskeletal: Positive for back pain.  Neurological: Negative for dizziness and headaches.  Hematological: Negative for adenopathy. Does not bruise/bleed easily.  Psychiatric/Behavioral: Negative for behavioral problems and confusion.    Past Medical History  Diagnosis Date  . Hypertension   . Atrial fibrillation with slow ventricular response (HCC)     long standing persistent atrial fibrillation  . Heart murmur   . Pneumonia 1992  . Arthritis   . Chronic anticoagulation   . Tachycardia-bradycardia [facility])     s/p PPM    Social  History   Social History  . Marital Status: Married    Spouse Name: N/A  . Number of Children: N/A  . Years of Education: N/A   Occupational History  . Retired   . Part-time driver for car company    Social History Main Topics  . Smoking status: Former Smoker -- 10 years    Types: Pipe, Cigars  . Smokeless tobacco: Never Used  . Alcohol Use: 1.8 oz/week    3 Cans of beer per week  . Drug Use: No  . Sexual Activity: Yes   Other Topics Concern  . Not on file   Social History Narrative   Lives with wife, still works part-time as a Geophysicist/field seismologist. He is active around the house and yard...   Parents are deceased and did not have cardiac issues but he has 2 siblings with atrial fibrillation and a sister-in-law  has a pacemaker.    Past Surgical History  Procedure Laterality Date  . Fracture surgery  2005    left ankle  . Pacemaker insertion  01/31/12    SJM Accent SR RF implanted by DR Allred  . Permanent pacemaker insertion N/A 01/31/2012    Procedure: PERMANENT PACEMAKER INSERTION;  Surgeon: Thompson Grayer, MD;  Location: [facility] CATH LAB;  Service: Cardiovascular;  Laterality: N/A;    Family History  Problem Relation Age of Onset  . Hypertension      Allergies  Allergen Reactions  . Cartia Xt [Diltiazem Hcl Er Beads]     Rash  . Diltiazem Hcl     Rash from red dye in generic capsule. Can take different brand. Takes diltiazem - his allergy is to Cardizem  . Sulfa Antibiotics     Unknown childhood reaction    Current Outpatient Prescriptions on File Prior to Visit  Medication Sig Dispense Refill  . irbesartan-hydrochlorothiazide (AVALIDE) 150-12.5 MG per tablet Take 0.5 tablets by mouth daily.     Marland Kitchen loratadine (CLARITIN) 10 MG tablet Take 10 mg by mouth daily as needed. For seasonal allergies    . metoprolol succinate (TOPROL-XL) 100 MG 24 hr tablet Take 100 mg by mouth 2 (two) times daily. Take with or immediately following a meal.    . Rivaroxaban (XARELTO) 20 MG TABS Take 1  tablet (20 mg total) by mouth daily. 30 tablet   . diltiazem (CARDIZEM CD) 240 MG 24 hr capsule Take 1 capsule (240 mg total) by mouth daily. 90 capsule 3   No current facility-administered medications on file prior to visit.    BP 134/80 mmHg  Pulse 88  Temp(Src) 97.5 F (36.4 C) (Oral)  Ht 6' (1.829 m)  Wt 223 lb 6.4 oz (101.334 kg)  BMI 30.29 kg/m2  SpO2 98%       Objective:   Physical Exam  General Appearance- Not in acute distress.  HEENT Eyes- Scleraeral/Conjuntiva-bilat- Not Yellow. Mouth & Throat- Normal.  Chest and Lung Exam Auscultation: Breath sounds:-Normal. Adventitious sounds:- No Adventitious sounds.  Cardiovascular Auscultation:Rythm - Regular. Heart Sounds -Normal heart sounds.  Abdomen Inspection:-Inspection Normal.  Palpation/Perucssion: Palpation and Percussion of the abdomen reveal- Non Tender but on palpatoin reports mild upper quadrant pressure sensation., No Rebound tenderness, No rigidity(Guarding) and No Palpable abdominal masses.  Liver:-Normal.  Spleen:- Normal.  No abdomen bruit heard.  Back- no cva area pain. (on palpation pain but  pt describes rt side mid para thoracic region area of pain when he has pain. But not tender presently)  Rt side ribs no pain on palpation.  Skin- shows no rash of vesicles.      Assessment & Plan:  For your abdomen pain will need to do labs today.(labs will be cbc, cmp, amylase and lipase).  You have appointment for Abdomen US at 5:30 today. Please get study done and wait there until I am called with results. Then will discuss results with you.  If above studies negative but your pain persists then you may need imaging studies such as CT.  If you abdomen changes rapidly and dramatically pending above studies then emergency department evaluation.  Follow up date to be determined after results of studies back.  With 1+ blood in urine will get culture of urine. With his hx of stone in past will make  tramadol available.(But note presently his pain pattern like renal colic)  I talked with pt later in day. His lab work did not show any abnormality and his [place] did not show acute abnormality.  Will go ahead and try to schedule ct abd/pelvis tomorrow hopefully in the am. Pt pain level in back rt side mostly on lying flat and level 5.(still some pain rt upper quadrant region at time) He will take tramadol tonight. I advised if his pain level severe despite tramadol then ED evaluation tonight.

## 2015-09-24 NOTE — Patient Instructions (Signed)
For your abdomen pain will need to do labs today.(labs will be cbc, cmp, amylase and lipase).  You have appointment for Abdomen US at 5:30 today. Please get study done and wait there until I am called with results. Then will discuss results with you.  If above studies negative but your pain persists then you may need imaging studies such as CT.  If you abdomen changes rapidly and dramatically pending above studies then emergency department evaluation.  Follow up date to be determined after results of studies back.

## 2015-09-24 NOTE — Progress Notes (Signed)
Pre visit review using our clinic review tool, if applicable. No additional management support is needed unless otherwise documented below in the visit note. 

## 2015-09-25 ENCOUNTER — Telehealth: Payer: Self-pay | Admitting: Medical

## 2015-09-25 ENCOUNTER — Ambulatory Visit (HOSPITAL_BASED_OUTPATIENT_CLINIC_OR_DEPARTMENT_OTHER)
Admission: RE | Admit: 2015-09-25 | Discharge: 2015-09-25 | Disposition: A | Discharge status: Home / Self Care | Payer: PPO | Source: Ambulatory Visit | Attending: Medical | Admitting: Medical

## 2015-09-25 DIAGNOSIS — K5790 Diverticulosis of intestine, part unspecified, without perforation or abscess without bleeding: Secondary | ICD-10-CM | POA: Insufficient documentation

## 2015-09-25 DIAGNOSIS — R1011 Right upper quadrant pain: Secondary | ICD-10-CM | POA: Diagnosis not present

## 2015-09-25 DIAGNOSIS — R319 Hematuria, unspecified: Secondary | ICD-10-CM | POA: Diagnosis not present

## 2015-09-25 DIAGNOSIS — R109 Unspecified abdominal pain: Secondary | ICD-10-CM | POA: Diagnosis not present

## 2015-09-25 DIAGNOSIS — M546 Pain in thoracic spine: Secondary | ICD-10-CM | POA: Diagnosis not present

## 2015-09-25 NOTE — Telephone Encounter (Signed)
Pt ct was negative. [place] was negative. Pt is still getting transient back pain and some rt upper quadrant/lower rib area pain. Pt states with tramadol when he gets pain it is only 3/10. He was doing fine when I talked with him walking his dog. I want him to call on Monday morning to be seen Monday or Tuesday am.

## 2015-09-25 NOTE — Telephone Encounter (Signed)
Patient has appointment for 09/25/15 @12 

## 2015-09-26 LAB — URINE CULTURE: Colony Count: 10000

## 2015-09-28 ENCOUNTER — Ambulatory Visit (HOSPITAL_BASED_OUTPATIENT_CLINIC_OR_DEPARTMENT_OTHER)
Admission: RE | Admit: 2015-09-28 | Discharge: 2015-09-28 | Disposition: A | Discharge status: Home / Self Care | Payer: PPO | Source: Ambulatory Visit | Attending: Medical | Admitting: Medical

## 2015-09-28 ENCOUNTER — Encounter: Payer: Self-pay | Admitting: Medical

## 2015-09-28 ENCOUNTER — Ambulatory Visit (INDEPENDENT_AMBULATORY_CARE_PROVIDER_SITE_OTHER): Payer: PPO | Admitting: Medical

## 2015-09-28 VITALS — BP 118/70 | HR 90 | Temp 97.6°F | Ht 72.0 in | Wt 225.6 lb

## 2015-09-28 DIAGNOSIS — R319 Hematuria, unspecified: Secondary | ICD-10-CM | POA: Diagnosis not present

## 2015-09-28 DIAGNOSIS — M546 Pain in thoracic spine: Secondary | ICD-10-CM

## 2015-09-28 DIAGNOSIS — R1011 Right upper quadrant pain: Secondary | ICD-10-CM

## 2015-09-28 DIAGNOSIS — Z950 Presence of cardiac pacemaker: Secondary | ICD-10-CM | POA: Diagnosis not present

## 2015-09-28 DIAGNOSIS — R079 Chest pain, unspecified: Secondary | ICD-10-CM | POA: Diagnosis not present

## 2015-09-28 LAB — POC URINALSYSI DIPSTICK (AUTOMATED)
Bilirubin, UA: NEGATIVE
Glucose, UA: NEGATIVE
KETONES UA: NEGATIVE
LEUKOCYTES UA: NEGATIVE
Nitrite, UA: NEGATIVE
PH UA: 6
Spec Grav, UA: >=1.03
UROBILINOGEN UA: 0.2

## 2015-09-28 NOTE — Patient Instructions (Addendum)
With your rt upper quadrant with pain that seems to consistently settle toward your thoracic back region will get GI opinion. I want to see if they think you need functional gallbladder studies.   With your back pain and rt upper quadrant pain will go ahead and get cxr today. Will evaluate your lower lung fields.  With your history of blood in urine and recent blood in urine despite negative CT showing no kidney stones, I will refer you to Dr. Barnie Del urologist.  For your transient mild pain presently use tylenol. For more moderate  pain can use tramadol. If severe extreme type pain then ED evaluation pending GI referral.  Follow up with me in 2 weeks or as needed

## 2015-09-28 NOTE — Progress Notes (Signed)
Subjective:    Patient ID: Brian Davidson, male    DOB: 08-07-1936, 80 y.o.   MRN: [id number]  HPI   Pt in for follow up. Pt states that he ate bowel of cereal he had some faint dull area pain ruq region pain. Pt states his skin feels more sensitive on rt side. Pt has had shingle vaccine 2 years. Pt has not had any skin out break. Pain in his back is still running toward his back.   Pt pain runs toward his back toward his rt upper back.  Pt states about half the time that he eats will have some pain/fullness in his side(Ruq pain)  Pt gallbladder test was negative. Pt CT scan was negative.   Pt not taking any tramadol. But he will take tylenol.   Low level pain most of day level 1-3. At times when he wakes pain when he first wakes up has some back pain.   Pt had some blood in his urine about one year. Ago. Pt used to smoke but maybe 30 years ago. Hx of kidney stones. Couple with no pain. Dr. Barnie Del saw him and he removed in August 2016.     Review of Systems  Constitutional: Negative for fever, chills and fatigue.  Respiratory: Negative for cough, chest tightness, shortness of breath and wheezing.   Cardiovascular: Negative for chest pain and palpitations.  Gastrointestinal: Positive for abdominal pain. Negative for nausea, vomiting, diarrhea, constipation, blood in stool, anal bleeding and rectal pain.       See hpi.  Musculoskeletal: Positive for back pain.       See hpi.  Neurological: Negative for dizziness and headaches.  Hematological: Negative for adenopathy. Does not bruise/bleed easily.  Psychiatric/Behavioral: Negative for behavioral problems and confusion.    Past Medical History  Diagnosis Date  . Hypertension   . Atrial fibrillation with slow ventricular response (HCC)     long standing persistent atrial fibrillation  . Heart murmur   . Pneumonia 1992  . Arthritis   . Chronic anticoagulation   . Tachycardia-bradycardia [facility])     s/p PPM    Social  History   Social History  . Marital Status: Married    Spouse Name: N/A  . Number of Children: N/A  . Years of Education: N/A   Occupational History  . Retired   . Part-time driver for car company    Social History Main Topics  . Smoking status: Former Smoker -- 10 years    Types: Pipe, Cigars  . Smokeless tobacco: Never Used  . Alcohol Use: 1.8 oz/week    3 Cans of beer per week  . Drug Use: No  . Sexual Activity: Yes   Other Topics Concern  . Not on file   Social History Narrative   Lives with wife, still works part-time as a Geophysicist/field seismologist. He is active around the house and yard...   Parents are deceased and did not have cardiac issues but he has 2 siblings with atrial fibrillation and a sister-in-law  has a pacemaker.    Past Surgical History  Procedure Laterality Date  . Fracture surgery  2005    left ankle  . Pacemaker insertion  01/31/12    SJM Accent SR RF implanted by DR Allred  . Permanent pacemaker insertion N/A 01/31/2012    Procedure: PERMANENT PACEMAKER INSERTION;  Surgeon: Thompson Grayer, MD;  Location: [facility] CATH LAB;  Service: Cardiovascular;  Laterality: N/A;    Family History  Problem  Relation Age of Onset  . Hypertension      Allergies  Allergen Reactions  . Cartia Xt [Diltiazem Hcl Er Beads]     Rash  . Diltiazem Hcl     Rash from red dye in generic capsule. Can take different brand. Takes diltiazem - his allergy is to Cardizem  . Sulfa Antibiotics     Unknown childhood reaction    Current Outpatient Prescriptions on File Prior to Visit  Medication Sig Dispense Refill  . irbesartan-hydrochlorothiazide (AVALIDE) 150-12.5 MG per tablet Take 0.5 tablets by mouth daily.     Marland Kitchen loratadine (CLARITIN) 10 MG tablet Take 10 mg by mouth daily as needed. For seasonal allergies    . metoprolol succinate (TOPROL-XL) 100 MG 24 hr tablet Take 100 mg by mouth 2 (two) times daily. Take with or immediately following a meal.    . Rivaroxaban (XARELTO) 20 MG TABS Take 1  tablet (20 mg total) by mouth daily. 30 tablet   . diltiazem (CARDIZEM CD) 240 MG 24 hr capsule Take 1 capsule (240 mg total) by mouth daily. 90 capsule 3   No current facility-administered medications on file prior to visit.    BP 118/70 mmHg  Pulse 90  Temp(Src) 97.6 F (36.4 C) (Oral)  Ht 6' (1.829 m)  Wt 225 lb 9.6 oz (102.331 kg)  BMI 30.59 kg/m2  SpO2 98%       Objective:   Physical Exam  General Appearance- Not in acute distress.  HEENT Eyes- Scleraeral/Conjuntiva-bilat- Not Yellow. Mouth & Throat- Normal.  Chest and Lung Exam Auscultation: Breath sounds:-Normal. Adventitious sounds:- No Adventitious sounds.  Cardiovascular Auscultation:Rythm - Regular. Heart Sounds -Normal heart sounds.  Abdomen Inspection:-Inspection Normal.  Palpation/Perucssion: Palpation and Percussion of the abdomen reveal- faint dull sensation at best  ruq., No Rebound tenderness, No rigidity(Guarding) and No Palpable abdominal masses.  Liver:-Normal.  Spleen:- Normal.  No abdomen bruit heard.  Back- no cva area pain. (on palpation pain but pt describes rt side mid para thoracic region area of pain when he has pain. But not tender presently)  Rt side ribs no pain on palpation.  Skin- shows no rash of vesicles.      Assessment & Plan:  With your rt upper quadrant with pain that seems to consistently settle toward your thoracic back region will get GI opinion. I want to see if they think you need functional gallbladder studies.   With your back pain and rt upper quadrant pain will go ahead and get cxr today. Will evaluate your lower lung fields.  With your history of blood in urine and recent blood in urine despite negative CT showing no kidney stones, I will refer you to Dr. Barnie Del urologist.  For your transient mild pain presently use tylenol. For more moderate  pain can use tramadol. If severe extreme type pain then ED evaluation pending GI referral.  Follow up with me  in 2 weeks or as needed

## 2015-09-28 NOTE — Progress Notes (Signed)
Pre visit review using our clinic review tool, if applicable. No additional management support is needed unless otherwise documented below in the visit note. 

## 2015-09-29 ENCOUNTER — Encounter: Payer: Self-pay | Admitting: Physician Assistant

## 2015-09-29 LAB — URINE CULTURE: Colony Count: 3000

## 2015-10-01 ENCOUNTER — Encounter: Payer: Self-pay | Admitting: Family Medicine

## 2015-10-07 ENCOUNTER — Encounter: Payer: Self-pay | Admitting: Physician Assistant

## 2015-10-07 ENCOUNTER — Other Ambulatory Visit (INDEPENDENT_AMBULATORY_CARE_PROVIDER_SITE_OTHER): Payer: PPO

## 2015-10-07 ENCOUNTER — Ambulatory Visit (INDEPENDENT_AMBULATORY_CARE_PROVIDER_SITE_OTHER): Payer: PPO | Admitting: Physician Assistant

## 2015-10-07 VITALS — BP 118/78 | HR 88 | Ht 72.0 in | Wt 224.4 lb

## 2015-10-07 DIAGNOSIS — R1011 Right upper quadrant pain: Secondary | ICD-10-CM

## 2015-10-07 DIAGNOSIS — K589 Irritable bowel syndrome without diarrhea: Secondary | ICD-10-CM

## 2015-10-07 DIAGNOSIS — R140 Abdominal distension (gaseous): Secondary | ICD-10-CM | POA: Diagnosis not present

## 2015-10-07 DIAGNOSIS — K219 Gastro-esophageal reflux disease without esophagitis: Secondary | ICD-10-CM | POA: Diagnosis not present

## 2015-10-07 LAB — HEPATIC FUNCTION PANEL
ALBUMIN: 4 g/dL (ref 3.5–5.2)
ALK PHOS: 83 U/L (ref 39–117)
ALT: 14 U/L (ref 0–53)
AST: 16 U/L (ref 0–37)
Bilirubin, Direct: 0.2 mg/dL (ref 0.0–0.3)
Total Bilirubin: 1 mg/dL (ref 0.2–1.2)
Total Protein: 7.5 g/dL (ref 6.0–8.3)

## 2015-10-07 LAB — LIPASE: Lipase: 64 U/L — ABNORMAL HIGH (ref 11.0–59.0)

## 2015-10-07 LAB — CBC WITH DIFFERENTIAL/PLATELET
BASOS PCT: 0.7 % (ref 0.0–3.0)
Basophils Absolute: 0.1 10*3/uL (ref 0.0–0.1)
Eosinophils Absolute: 0.1 10*3/uL (ref 0.0–0.7)
Eosinophils Relative: 1.7 % (ref 0.0–5.0)
HEMATOCRIT: 46.4 % (ref 39.0–52.0)
Hemoglobin: 15.4 g/dL (ref 13.0–17.0)
Lymphocytes Relative: 15.9 % (ref 12.0–46.0)
Lymphs Abs: 1.3 10*3/uL (ref 0.7–4.0)
MCHC: 33.3 g/dL (ref 30.0–36.0)
MCV: 88.3 fl (ref 78.0–100.0)
Monocytes Absolute: 1.2 10*3/uL — ABNORMAL HIGH (ref 0.1–1.0)
Monocytes Relative: 14.6 % — ABNORMAL HIGH (ref 3.0–12.0)
Neutro Abs: 5.5 10*3/uL (ref 1.4–7.7)
Neutrophils Relative %: 67.1 % (ref 43.0–77.0)
Platelets: 180 10*3/uL (ref 150.0–400.0)
RBC: 5.25 Mil/uL (ref 4.22–5.81)
RDW: 14.3 % (ref 11.5–15.5)
WBC: 8.3 10*3/uL (ref 4.0–10.5)

## 2015-10-07 LAB — IGA: IgA: 358 mg/dL (ref 68–378)

## 2015-10-07 LAB — AMYLASE: Amylase: 81 U/L (ref 27–131)

## 2015-10-07 MED ORDER — OMEPRAZOLE 20 MG PO CPDR: 20.0000 mg | DELAYED_RELEASE_CAPSULE | ORAL | Status: DC

## 2015-10-07 MED ORDER — HYOSCYAMINE SULFATE 0.125 MG SL SUBL: 0.1250 mg | SUBLINGUAL_TABLET | Freq: Four times a day (QID) | SUBLINGUAL | Status: DC | PRN

## 2015-10-07 NOTE — Progress Notes (Signed)
Agree with assessment and plan as outlined.  

## 2015-10-07 NOTE — Patient Instructions (Signed)
You have been scheduled for a HIDA scan at [facility] (1st floor) on 10/22/15. Please arrive 15 minutes prior to your scheduled appointment at  [id number] am. Make certain not to have anything to eat or drink at least 6 hours prior to your test. Should this appointment date or time not work well for you, please call radiology scheduling at [phone]. Please Hold all stomach meds prior to test: Omeprazole and Levsin 6 hours before.  _____________________________________________________________________ hepatobiliary (HIDA) scan is an imaging procedure used to diagnose problems in the liver, gallbladder and bile ducts. In the HIDA scan, a radioactive chemical or tracer is injected into a vein in your arm. The tracer is handled by the liver like bile. Bile is a fluid produced and excreted by your liver that helps your digestive system break down fats in the foods you eat. Bile is stored in your gallbladder and the gallbladder releases the bile when you eat a meal. A special nuclear medicine scanner (gamma camera) tracks the flow of the tracer from your liver into your gallbladder and small intestine.  During your HIDA scan  You'll be asked to change into a hospital gown before your HIDA scan begins. Your health care team will position you on a table, usually on your back. The radioactive tracer is then injected into a vein in your arm.The tracer travels through your bloodstream to your liver, where it's taken up by the bile-producing cells. The radioactive tracer travels with the bile from your liver into your gallbladder and through your bile ducts to your small intestine.You may feel some pressure while the radioactive tracer is injected into your vein. As you lie on the table, a special gamma camera is positioned over your abdomen taking pictures of the tracer as it moves through your body. The gamma camera takes pictures continually for about an hour. You'll need to keep still during the HIDA scan. This  can become uncomfortable, but you may find that you can lessen the discomfort by taking deep breaths and thinking about other things. Tell your health care team if you're uncomfortable. The radiologist will watch on a computer the progress of the radioactive tracer through your body. The HIDA scan may be stopped when the radioactive tracer is seen in the gallbladder and enters your small intestine. This typically takes about an hour. In some cases extra imaging will be performed if original images aren't satisfactory, if morphine is given to help visualize the gallbladder or if the medication CCK is given to look at the contraction of the gallbladder. This test typically takes 2 hours to complete. ________________________________________________________________________   Your physician has requested that you go to the basement for lab work before leaving today.  We have sent the following medications to your pharmacy for you to pick up at your convenience: Levsin 0.125 sublingual tablets every 6 hrs as needed for cramps or spasms.  Omeprazole 20 mg once daily 30 mins before bedtime.

## 2015-10-07 NOTE — Progress Notes (Addendum)
Patient ID: [name], male   DOB: May 13, 1936, 80 y.o.   MRN: [id number]    HPI:  Brian Davidson is a 80 y.o.   male  referred by Brian Lukes, MD  for evaluation of postprandial right upper quadrant pain and bloating. His past medical history  Is significant for hypertension, atrial fibrillation on chronic anticoagulation, heart murmur, and arthritis. Brian Davidson reports that for the past month or so he is having postprandial right upper quadrant pain that radiates to the right shoulder blade. 2 weeks ago he had a very severe episode at night and in the morning was very nauseous. He notes that recently he is frequently getting nauseous after he eats and even when he chews gums. He has had  Abdominal ultrasound  With no evidence of cholelithiasis or acute cholecystitis. Common bile duct measured 3 mm and there was no evidence of intrahepatic or extrahepatic dilatation. He had CT of the abdomen and pelvis that showed the liver gallbladder spleen and adrenals and pancreas are within normal limits. Diverticular changes noted without evidence of diverticulitis. The appendix is well visualized and within normal limits. Laboratory studies revealed a total bili 0.7, alkaline phosphatase 81, AST 18, ALT 15. He reports that he has been belching and burping a lot and has excess amounts of gas. His stools have been thin and broken into pieces. He states his stools smell stronger than normal and have been oily. He says over the past several days he has been experiencing postprandial left upper quadrant pain that is relieved when he passes gas. He reports that his mother had gallbladder disease and his sister had gallbladder disease.  He denies use of nonsteroidal anti-inflammatories or EtOH.   He reports that he saw Brian. Ferdinand Davidson at the [facility] on [address] in [place] and had a colonoscopy 3 years ago. He stated he had a polyp removed and was told to repeat his colonoscopy in 5 years. He denies a family  history of colon cancer, colon polyps, or inflammatory bowel disease.   Past Medical History  Diagnosis Date  . Hypertension   . Atrial fibrillation with slow ventricular response (HCC)     long standing persistent atrial fibrillation  . Heart murmur   . Pneumonia 1992  . Arthritis   . Chronic anticoagulation   . Tachycardia-bradycardia [facility])     s/p PPM  . Kidney stone 03/2015    Past Surgical History  Procedure Laterality Date  . Fracture surgery  2005    left ankle  . Pacemaker insertion  01/31/12    SJM Accent SR RF implanted by Brian Davidson  . Permanent pacemaker insertion N/A 01/31/2012    Procedure: PERMANENT PACEMAKER INSERTION;  Surgeon: Brian Grayer, MD;  Location: [facility] CATH LAB;  Service: Cardiovascular;  Laterality: N/A;   Family History  Problem Relation Age of Onset  . Hypertension Mother   . Colon cancer Neg Hx   . Stroke Father    Social History  Substance Use Topics  . Smoking status: Former Smoker -- 10 years    Types: Pipe, Cigars  . Smokeless tobacco: Never Used  . Alcohol Use: 1.8 oz/week    3 Cans of beer per week     Comment: occ   Current Outpatient Prescriptions  Medication Sig Dispense Refill  . diltiazem (CARDIZEM CD) 240 MG 24 hr capsule Take 240 mg by mouth daily.    . irbesartan-hydrochlorothiazide (AVALIDE) 150-12.5 MG per tablet Take 0.5 tablets  by mouth daily.     Marland Kitchen loratadine (CLARITIN) 10 MG tablet Take 10 mg by mouth daily as needed. For seasonal allergies    . metoprolol succinate (TOPROL-XL) 100 MG 24 hr tablet Take 100 mg by mouth 2 (two) times daily. Take with or immediately following a meal.    . Rivaroxaban (XARELTO) 20 MG TABS Take 1 tablet (20 mg total) by mouth daily. 30 tablet   . hyoscyamine (LEVSIN SL) 0.125 MG SL tablet Place 1 tablet (0.125 mg total) under the tongue every 6 (six) hours as needed for cramping. 100 tablet 1  . omeprazole (PRILOSEC) 20 MG capsule Take 1 capsule (20 mg total) by mouth every morning. 30 mins prior  to breakfast. 30 capsule 6   No current facility-administered medications for this visit.   Allergies  Allergen Reactions  . Cartia Xt [Diltiazem Hcl Er Beads]     Rash  . Diltiazem Hcl     Rash from red dye in generic capsule. Can take different brand. Takes diltiazem - his allergy is to Cardizem  . Sulfa Antibiotics     Unknown childhood reaction     Review of Systems: Gen: Denies any fever, chills, sweats, anorexia, fatigue, weakness, malaise, weight loss, and sleep disorder CV: Denies chest pain, angina, palpitations, syncope, orthopnea, PND, peripheral edema, and claudication. Resp: Denies dyspnea at rest, dyspnea with exercise, cough, sputum, wheezing, coughing up blood, and pleurisy. GI: Denies vomiting blood, jaundice, and fecal incontinence.   Denies dysphagia or odynophagia. GU : Denies urinary burning, blood in urine, urinary frequency, urinary hesitancy, nocturnal urination, and urinary incontinence. MS: Denies joint pain, limitation of movement, and swelling, stiffness, low back pain, extremity pain. Denies muscle weakness, cramps, atrophy.  Derm: Denies rash, itching, dry skin, hives, moles, warts, or unhealing ulcers.  Psych: Denies depression, anxiety, memory loss, suicidal ideation, hallucinations, paranoia, and confusion. Heme: Denies bruising, bleeding, and enlarged lymph nodes. Neuro:  Denies any headaches, dizziness, paresthesias. Endo:  Denies any problems with DM, thyroid, adrenal function  Studies: Dg Chest 2 View  09/29/2015  CLINICAL DATA:  Right side chest pain radiating to his back last month EXAM: CHEST  2 VIEW COMPARISON:  02/01/2012 FINDINGS: Cardiomediastinal silhouette is stable. No acute infiltrate or pleural effusion. No pulmonary edema. Single lead cardiac pacemaker is unchanged in position. Old left rib fractures are again noted. IMPRESSION: No active cardiopulmonary disease. Single lead cardiac pacemaker in place. Electronically Signed   By:  Brian Davidson M.D.   On: 09/29/2015 08:02   US Abdomen Complete  09/24/2015  CLINICAL DATA:  80 year old male with acute on chronic right abdominal pain. EXAM: ABDOMEN ULTRASOUND COMPLETE COMPARISON:  03/30/2015 CT FINDINGS: Gallbladder: There is no evidence of cholelithiasis or acute cholecystitis. Common bile duct: Diameter: 3 mm. There is no evidence of intrahepatic or extrahepatic dilatation. Liver: No focal lesion identified. Within normal limits in parenchymal echogenicity. IVC: No abnormality visualized. Pancreas: Visualized portion unremarkable. Spleen: Size and appearance within normal limits. Right Kidney: Length: 10.7 cm. Mild cortical atrophy noted. Echogenicity within normal limits. No mass or hydronephrosis visualized. Left Kidney: Length: 11.9 cm. Mild cortical atrophy noted. Echogenicity within normal limits. No mass or hydronephrosis visualized. Abdominal aorta: No aneurysm visualized. Other findings: None. IMPRESSION: No evidence of acute abnormality. Mild bilateral renal cortical atrophy. Electronically Signed   By: Margarette Canada M.D.   On: 09/24/2015 19:00   Ct Renal Stone Study  09/25/2015  CLINICAL DATA:  Right-sided flank pain for 1 month EXAM:  CT ABDOMEN AND PELVIS WITHOUT CONTRAST TECHNIQUE: Multidetector CT imaging of the abdomen and pelvis was performed following the standard protocol without IV contrast. COMPARISON:  09/24/2015 FINDINGS: Lung bases demonstrate evidence of a calcified granuloma in the right lower lobe. A pacing device is seen within the right ventricle. The liver, gallbladder, spleen, adrenal glands and pancreas are within normal limits. The kidneys are well visualized bilaterally and reveal no mass lesion or obstructive change. No definitive renal calculi are seen. The ureters are within normal limits bilaterally. The bladder is well distended. Diverticular change is noted without evidence of diverticulitis. The appendix is well visualized and within normal limits. Mild  aortoiliac calcifications are seen without aneurysmal dilatation. No pelvic mass lesion or sidewall abnormality is noted. The osseous structures show no acute abnormality. Degenerative changes of the lumbar spine are seen. IMPRESSION: No renal calculi or obstructive changes. Diverticulosis without diverticulitis. Electronically Signed   By: Inez Catalina M.D.   On: 09/25/2015 12:38     Physical Exam: BP 118/78 mmHg  Pulse 88  Ht 6' (1.829 m)  Wt 224 lb 6.4 oz (101.787 kg)  BMI 30.43 kg/m2 Constitutional: Pleasant,well-developed, male in no acute distress. HEENT: Normocephalic and atraumatic. Conjunctivae are normal. No scleral icterus. Neck supple.  No JVD Cardiovascular: Normal rate, regular rhythm.  Pulmonary/chest: Effort normal and breath sounds normal. No wheezing, rales or rhonchi. Abdominal: Soft, nondistended,  Mild epigastric and right upper quadrant tenderness to palpation with no rebound or guarding, Bowel sounds active throughout. There are no masses palpable. No hepatomegaly. Extremities: no edema Lymphadenopathy: No cervical adenopathy noted. Neurological: Alert and oriented to person place and time. Skin: Skin is warm and dry. No rashes noted. Psychiatric: Normal mood and affect. Behavior is normal.  ASSESSMENT AND PLAN:  80 year old male presenting with a one-month history of right upper quadrant pain , bloating, and belching. LFTs and abdominal ultrasound have been nonrevealing. Patient will be scheduled for a HIDA  Scan with [facility]. If he has a nonfunctioning gallbladder he will be referred for surgical evaluation. In the meantime, an antireflux regimen has been reviewed. He will use omeprazole 20 mg 1 by mouth every morning 30 minutes prior to breakfast (he had been prescribed this by his PCP). A CBC, amylase, lipase, hepatic function panel will be obtained. Due to his complaints of soft, oily stools, and IgA, TTG, and pancreatic fecal elastase will be obtained. If his IgA and  TTG are suggestive of celiac, he will be considered for an EGD with biopsy. If his pancreatic fecal elastase is low, he will be given a trial of Creon. In the meantime he will use Levsin 0.125 mg one sublingually every 6 hours when necessary spasm. He has also signed a medical release to obtain his colonoscopy report from Brian. Ferdinand Davidson in [facility]. He will follow up in 4 weeks, sooner if needed.    Brian Davidson, Deloris Ping 10/07/2015, 4:23 PM  CC: Brian Lukes, MD  Addendum 10/08/2015: Received a colonoscopy report from Brian. Ferdinand Davidson. Brian Davidson had a colonoscopy on 09/20/2010. At that time he had 3 polyps removed. Bowel prep was felt to be in adequate. View of colonic mucosa was inadequate. Patient was advised to repeat colonoscopy in 1 year with a 2 day double prep. Biopsies revealed a mid transverse colon polyp is hyperplastic containing benign lymphoid aggregate. A mid transverse colon polyp was a tubular adenoma exhibiting low-grade dysplasia, marginal status uncertain. Proximal transverse colon polyp was hyperplastic. The patient was advised  to have her surveillance colonoscopy in January 2013.  Further notes received later show patient had a colonoscopy on 06/29/2009 5 polyps were removed. Bowel prep was inadequate. View of colonic mucosa was inadequate. Pathology revealed a cecal polyp to be a tubular adenoma exhibiting low-grade dysplasia, marginal status uncertain Proximal transverse colon polyp was a tubular adenoma exhibiting low-grade dysplasia, marginal status uncertain Mid transverse colon polyp was a tubular adenoma exhibiting low-grade dysplasia. Distal transverse colon polyp was hyperplastic negative for dysplasia Splenic flexure colon polyp was hyperplastic negative for dysplasia. He was advised to have surveillance in 2011.  Patient will need to have surveillance colonoscopy reviewed with him at next follow-up visit.

## 2015-10-08 ENCOUNTER — Other Ambulatory Visit: Payer: Self-pay | Admitting: *Deleted

## 2015-10-08 DIAGNOSIS — R748 Abnormal levels of other serum enzymes: Secondary | ICD-10-CM

## 2015-10-08 LAB — TISSUE TRANSGLUTAMINASE, IGA: Tissue Transglutaminase Ab, IgA: 1 U/mL (ref <4)

## 2015-10-13 ENCOUNTER — Encounter: Payer: Self-pay | Admitting: Physician Assistant

## 2015-10-13 ENCOUNTER — Other Ambulatory Visit: Payer: Self-pay | Admitting: Physician Assistant

## 2015-10-15 ENCOUNTER — Other Ambulatory Visit (INDEPENDENT_AMBULATORY_CARE_PROVIDER_SITE_OTHER): Payer: PPO

## 2015-10-15 DIAGNOSIS — K219 Gastro-esophageal reflux disease without esophagitis: Secondary | ICD-10-CM | POA: Diagnosis not present

## 2015-10-15 DIAGNOSIS — R748 Abnormal levels of other serum enzymes: Secondary | ICD-10-CM

## 2015-10-15 DIAGNOSIS — K589 Irritable bowel syndrome without diarrhea: Secondary | ICD-10-CM | POA: Diagnosis not present

## 2015-10-15 DIAGNOSIS — R140 Abdominal distension (gaseous): Secondary | ICD-10-CM | POA: Diagnosis not present

## 2015-10-15 DIAGNOSIS — R1011 Right upper quadrant pain: Secondary | ICD-10-CM | POA: Diagnosis not present

## 2015-10-15 LAB — LIPASE: Lipase: 34 U/L (ref 11.0–59.0)

## 2015-10-22 ENCOUNTER — Ambulatory Visit (HOSPITAL_COMMUNITY)
Admission: RE | Admit: 2015-10-22 | Discharge: 2015-10-22 | Disposition: A | Discharge status: Home / Self Care | Payer: PPO | Source: Ambulatory Visit | Attending: Physician Assistant | Admitting: Physician Assistant

## 2015-10-22 DIAGNOSIS — K219 Gastro-esophageal reflux disease without esophagitis: Secondary | ICD-10-CM | POA: Diagnosis not present

## 2015-10-22 DIAGNOSIS — K589 Irritable bowel syndrome without diarrhea: Secondary | ICD-10-CM | POA: Diagnosis not present

## 2015-10-22 DIAGNOSIS — R140 Abdominal distension (gaseous): Secondary | ICD-10-CM | POA: Insufficient documentation

## 2015-10-22 DIAGNOSIS — R932 Abnormal findings on diagnostic imaging of liver and biliary tract: Secondary | ICD-10-CM | POA: Diagnosis not present

## 2015-10-22 DIAGNOSIS — R1011 Right upper quadrant pain: Secondary | ICD-10-CM | POA: Diagnosis not present

## 2015-10-22 LAB — PANCREATIC ELASTASE, FECAL: Pancreatic Elastase-1, Stool: 375 mcg/g

## 2015-10-22 MED ORDER — TECHNETIUM TC 99M MEBROFENIN IV KIT
5.0000 | PACK | Freq: Once | INTRAVENOUS | Status: AC | PRN
  Administered 2015-10-22: 5 via INTRAVENOUS

## 2015-10-22 MED ORDER — SINCALIDE 5 MCG IJ SOLR
0.0200 ug/kg | Freq: Once | INTRAMUSCULAR | Status: AC
  Administered 2015-10-22: 2 ug via INTRAVENOUS

## 2015-10-26 ENCOUNTER — Other Ambulatory Visit: Payer: Self-pay | Admitting: *Deleted

## 2015-10-26 MED ORDER — METRONIDAZOLE 250 MG PO TABS: 250.0000 mg | ORAL_TABLET | Freq: Three times a day (TID) | ORAL | Status: DC

## 2015-10-29 DIAGNOSIS — K828 Other specified diseases of gallbladder: Secondary | ICD-10-CM | POA: Diagnosis not present

## 2015-11-02 ENCOUNTER — Telehealth: Payer: Self-pay | Admitting: Internal Medicine

## 2015-11-02 NOTE — Telephone Encounter (Signed)
New message  Amy calling   Request for surgical clearance:  1. What type of surgery is being performed?  Lap coli When is this surgery scheduled? Unknown  2. Are there any medications that need to be held prior to surgery and how long? Xarelto unknown  Name of physician performing surgery? Dr. Redmond Pulling 3. What is your office phone and fax number?    [phone]     Fax   [phone]

## 2015-11-03 NOTE — Telephone Encounter (Addendum)
Ok to hold Xarelto for 48 hours prior to procedure and restart 24 hours after or as directed by MD. Clearance faxed to Dr. Dois Davenport office [phone].

## 2015-11-03 NOTE — Telephone Encounter (Signed)
Fax failed, had to call for correct fax # [phone]. Has been sent to correct fax.

## 2015-11-05 ENCOUNTER — Telehealth: Payer: Self-pay | Admitting: Pharmacist

## 2015-11-05 NOTE — Telephone Encounter (Signed)
F/u  Pt following up on clearance for surgery from Dr Redmond Pulling- information has been received. Please call back and discuss.

## 2015-11-05 NOTE — Telephone Encounter (Addendum)
Spoke with patient and let him know he should call Dr. Claudie Leach as he is his primary cardiologist.  He last saw him in Nov per the patient.  If anesthesia needs anything in regards to his PPM a standard form will be sent to the drvice clinic to fill out.  He is aware and will call Dr. Claudie Leach for cardiac clearance.  He states he has recently had an echo at his office.  He will let me know if any problems.

## 2015-11-05 NOTE — Telephone Encounter (Signed)
Spoke with nurse from Dr. Dois Davenport office.  They received Xarelto clearance but also need cardiac clearance as well.  Will send to Dr. Rayann Heman to address.  Please fax clearance to [phone].

## 2015-11-06 ENCOUNTER — Ambulatory Visit (INDEPENDENT_AMBULATORY_CARE_PROVIDER_SITE_OTHER): Payer: PPO | Admitting: Medical

## 2015-11-06 ENCOUNTER — Encounter: Payer: Self-pay | Admitting: Medical

## 2015-11-06 VITALS — BP 118/70 | HR 75 | Temp 98.1°F | Ht 72.0 in | Wt 229.0 lb

## 2015-11-06 DIAGNOSIS — R1011 Right upper quadrant pain: Secondary | ICD-10-CM | POA: Diagnosis not present

## 2015-11-06 DIAGNOSIS — K829 Disease of gallbladder, unspecified: Secondary | ICD-10-CM | POA: Diagnosis not present

## 2015-11-06 DIAGNOSIS — M546 Pain in thoracic spine: Secondary | ICD-10-CM

## 2015-11-06 MED ORDER — TRAMADOL HCL 50 MG PO TABS: 50.0000 mg | ORAL_TABLET | Freq: Four times a day (QID) | ORAL | Status: DC | PRN

## 2015-11-06 NOTE — Progress Notes (Signed)
Subjective:    Patient ID: Brian Davidson, male    DOB: 02-Mar-1936, 80 y.o.   MRN: [id number]  HPI  Pt in for evaluation. Reason surgical clearance. Pt has history of atrial fibrillation, htn, heat murmur, tachycardia-bradycardiac syndrome and is on chronic anticoagulation. Pt has a pacemaker.  Dr. Randel Pigg is pt pcp.  Pt surgery will be for Gallbladder(set up with Dr. Redmond Pulling [place] [facility]). Poor functioning gallbladder. Pt has constant fullness. Nausea and back pain.  On review it appears of notes she may need clearance from her primary cardiologist Dr. Claudie Leach.    Pt labs in February showed no anemia. No report of  black stools. No worsening  abdomen pain compared to prior visit but duration of symptoms beginning to bother him. In January normal cmp except mild decrease gfr. Last cxr did not report any cardiomegaly.  Pt last echo showed EF in 60-65% range.   Pt reports surgeries before. Lt ankle surgery.  Also General anesthesia to have kidney stones removed No adverse reaction to anesthesia  Pt not smoker.  No problems with wound healing per pt.  Dr. Rayann Heman has given some clearance regarding.Per pt descript this may have been related to pacemaker.(Pt thinks a letter was sent but no sure.)  Dr. Claudie Leach primary cardiologist out of town until the 20th. Pt has appointment with them on 21st.     Review of Systems  Constitutional: Negative for fever, chills, diaphoresis, activity change and fatigue.  Respiratory: Negative for cough, chest tightness and shortness of breath.   Cardiovascular: Negative for chest pain, palpitations and leg swelling.  Gastrointestinal: Positive for nausea and abdominal pain. Negative for vomiting.  Musculoskeletal: Positive for back pain. Negative for neck pain and neck stiffness.       Mild rt side upper thoracic. Back pain at times.   Neurological: Negative for dizziness, tremors, seizures, syncope, facial asymmetry, speech difficulty,  weakness, light-headedness, numbness and headaches.  Psychiatric/Behavioral: Negative for behavioral problems, confusion and agitation. The patient is not nervous/anxious.      Past Medical History  Diagnosis Date  . Hypertension   . Atrial fibrillation with slow ventricular response (HCC)     long standing persistent atrial fibrillation  . Heart murmur   . Pneumonia 1992  . Arthritis   . Chronic anticoagulation   . 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 Kidney stone 03/2015    Social History   Social History  . Marital Status: Married    Spouse Name: [name]  . Number of Children: 1  . Years of Education: N/A   Occupational History  . Retired   . Part-time driver for car company    Social History Main Topics  . Smoking status: Former Smoker -- 10 years    Types: Pipe, Cigars  . Smokeless tobacco: Never Used  . Alcohol Use: 1.8 oz/week    3 Cans of beer per week     Comment: occ  . Drug Use: No  . Sexual Activity: Yes   Other Topics Concern  . Not on file   Social History Narrative   Lives with wife, still works part-time as a Geophysicist/field seismologist. He is active around the house and yard...   Parents are deceased and did not have cardiac issues but he has 2 siblings with atrial fibrillation and a sister-in-law  has a pacemaker.    Past Surgical History  Procedure Laterality Date  . Fracture surgery  2005    left ankle  .  Pacemaker insertion  01/31/12    SJM Accent SR RF implanted by DR Allred  . Permanent pacemaker insertion N/A 01/31/2012    Procedure: PERMANENT PACEMAKER INSERTION;  Surgeon: Thompson Grayer, MD;  Location: [facility] CATH LAB;  Service: Cardiovascular;  Laterality: N/A;    Family History  Problem Relation Age of Onset  . Hypertension Mother   . Colon cancer Neg Hx   . Stroke Father     Allergies  Allergen Reactions  . Cartia Xt [Diltiazem Hcl Er Beads]     Rash  . Diltiazem Hcl     Rash from red dye in generic capsule. Can take different  brand. Takes diltiazem - his allergy is to Cardizem  . Sulfa Antibiotics     Unknown childhood reaction    Current Outpatient Prescriptions on File Prior to Visit  Medication Sig Dispense Refill  . diltiazem (CARDIZEM CD) 240 MG 24 hr capsule Take 240 mg by mouth daily.    . irbesartan-hydrochlorothiazide (AVALIDE) 150-12.5 MG per tablet Take 0.5 tablets by mouth daily.     Marland Kitchen loratadine (CLARITIN) 10 MG tablet Take 10 mg by mouth daily as needed. For seasonal allergies    . metoprolol succinate (TOPROL-XL) 100 MG 24 hr tablet Take 100 mg by mouth 2 (two) times daily. Take with or immediately following a meal.    . omeprazole (PRILOSEC) 20 MG capsule Take 1 capsule (20 mg total) by mouth every morning. 30 mins prior to breakfast. 30 capsule 6  . Rivaroxaban (XARELTO) 20 MG TABS Take 1 tablet (20 mg total) by mouth daily. 30 tablet    No current facility-administered medications on file prior to visit.    BP 118/70 mmHg  Pulse 75  Temp(Src) 98.1 F (36.7 C) (Oral)  Ht 6' (1.829 m)  Wt 229 lb (103.874 kg)  BMI 31.05 kg/m2  SpO2 98%       Objective:   Physical Exam  General Mental Status- Alert. General Appearance- Not in acute distress.   Skin General: Color- Normal Color. Moisture- Normal Moisture. No bruising.  Neck Carotid Arteries- Normal color. Moisture- Normal Moisture. No carotid bruits. No JVD.  Chest and Lung Exam Auscultation: Breath Sounds:-Normal.  Cardiovascular Auscultation:Rythm- Regular. Murmurs & Other Heart Sounds:Auscultation of the heart reveals- No Murmurs.  Abdomen Inspection:-Inspeection Normal. Palpation/Percussion:Note:No mass. Palpation and Percussion of the abdomen reveal- Non Tender, Non Distended + BS, no rebound or guarding.    Neurologic Cranial Nerve exam:- CN III-XII intact(No nystagmus), symmetric smile. Strength:- 5/5 equal and symmetric strength both upper and lower extremities.      Assessment & Plan:  Regarding your  surgical clearance, I have talked with Dr. Randel Pigg and she states you would need cardiac clearance and instruction from you primary cardiologist ffice. They would be the authority in light of your cardiac history.   I can put in referral and ask if other cardiologist/provider  would be willing to see your sooner.  I would offer you tramadol for discomfort if you need.  Follow up with [place] as needed

## 2015-11-06 NOTE — Progress Notes (Signed)
Pre visit review using our clinic review tool, if applicable. No additional management support is needed unless otherwise documented below in the visit note. 

## 2015-11-06 NOTE — Patient Instructions (Addendum)
Regarding your surgical clearance, I have talked with Dr. Randel Pigg and she states you would need cardiac clearance and instruction from you primary cardiologist office. They would be the authority in light of your cardiac history.   I can put in referral and ask if other cardiologist/provider in office would be willing to see your sooner.  I would offer you tramadol for discomfort if you need.  Follow up with [place] as needed

## 2015-11-18 ENCOUNTER — Ambulatory Visit: Payer: Self-pay | Admitting: General Surgery

## 2015-11-18 ENCOUNTER — Ambulatory Visit: Payer: PPO | Admitting: Gastroenterology

## 2015-11-18 NOTE — H&P (Signed)
Brian Davidson 10/29/2015 1:16 PM Location: [facility] Patient #: [id number] DOB: May 15, 1936 Married / Language: [name] / Race: White Male   History of Present Illness Brian Davidson M. Brian Menter Davidson; 10/29/2015 1:49 PM) The patient is a 80 year old male who presents for evaluation of gallbladder disease. He is referred by Dr Brian Davidson for evaluation of gallbladder disease. He relates an episode about 3 weeks ago in the middle the night when he awoke with right upper quadrant pain radiating to his back and shoulder. It was quite intense and lasted for several hours but eventually eased up. He reports that another significant flare like that since that initial episode. He ended up going to his primary care physician's office who ordered an abdominal ultrasound which showed no evidence of gallstones. She ended up also undergoing a CT renal stone study which showed no evidence of gallbladder pathology or renal stones. He continued to have intermittent episodes of right-sided right upper quadrant pain rating to his back associated with nausea. These episodes would last 30-40 minutes. They would occur at times after eating. He had a nuclear medicine scan which revealed an abnormal gallbladder ejection fraction at 7.5%. His blood work was essentially normal. He does take xarelto for chronic A. fib. He denies any chest pain, chest pressure, shortness of breath, dizziness or exertion, orthopnea, paroxysmal nocturnal dyspnea, TIAs, or amaurosis fugax. He does have a pacemaker.   Problem List/Past Medical Brian Davidson Brian Derby, Davidson; 10/29/2015 1:49 PM) BILIARY DYSKINESIA [phone])  Other Problems Brian Davidson; 10/29/2015 1:49 PM) Arthritis Atrial Fibrillation Back Pain Gastroesophageal Reflux Disease Hemorrhoids High blood pressure Inguinal Hernia Kidney Stone  Past Surgical History Brian Davidson, CMA; 10/29/2015 1:16 PM) Foot Surgery Left. Knee Surgery Right. Open Inguinal Hernia  Surgery Left.  Diagnostic Studies History Brian Davidson, [name]; 10/29/2015 1:16 PM) Colonoscopy 1-5 years ago  Allergies Brian Davidson, CMA; 10/29/2015 1:20 PM) Geronimo Boot XT *CALCIUM CHANNEL BLOCKERS* DiltiaZEM CD *CALCIUM CHANNEL BLOCKERS* Sulfabenzamide *CHEMICALS*  Medication History Brian Davidson, CMA; 10/29/2015 1:20 PM) Metoprolol Tartrate (100MG  Tablet, Oral daily) Active. DiltiaZEM CD (240MG  Capsule ER 24HR, Oral daily) Active. Avalide (150-12.5MG  Tablet, Oral daily) Active. Claritin (10MG  Tablet, Oral as needed) Active. Toprol XL (100MG  Tablet ER 24HR, Oral daily) Active. PriLOSEC (20MG  Capsule DR, Oral as needed) Active. Xarelto (20MG  Tablet, Oral as directed) Active. Flagyl (250MG  Tablet, Oral as directed) Active. Medications Reconciled  Social History Brian Davidson, CMA; 10/29/2015 1:16 PM) Alcohol use Occasional alcohol use. Caffeine use Carbonated beverages, Coffee. No drug use Tobacco use Former smoker.  Family History Brian Davidson, [name]; 10/29/2015 1:16 PM) Arthritis Father, Mother. Cerebrovascular Accident Father. Hypertension Father, Mother. Melanoma Father.    Review of Systems Brian Davidson CMA; 10/29/2015 1:16 PM) General Not Present- Appetite Loss, Chills, Fatigue, Fever, Night Sweats, Weight Gain and Weight Loss. Skin Not Present- Change in Wart/Mole, Dryness, Hives, Jaundice, New Lesions, Non-Healing Wounds, Rash and Ulcer. HEENT Present- Hearing Loss and Wears glasses/contact lenses. Not Present- Earache, Hoarseness, Nose Bleed, Oral Ulcers, Ringing in the Ears, Seasonal Allergies, Sinus Pain, Sore Throat, Visual Disturbances and Yellow Eyes. Respiratory Not Present- Bloody sputum, Chronic Cough, Difficulty Breathing, Snoring and Wheezing. Breast Not Present- Breast Mass, Breast Pain, Nipple Discharge and Skin Changes. Cardiovascular Present- Swelling of Extremities. Not Present- Chest Pain, Difficulty Breathing Lying Down, Leg  Cramps, Palpitations, Rapid Heart Rate and Shortness of Breath. Gastrointestinal Present- Change in Bowel Habits, Excessive gas, Hemorrhoids and Nausea. Not Present- Abdominal Pain, Bloating, Bloody Stool, Chronic diarrhea, Constipation, Difficulty Swallowing,  Gets full quickly at meals, Indigestion, Rectal Pain and Vomiting. Male Genitourinary Present- Blood in Urine and Impotence. Not Present- Change in Urinary Stream, Frequency, Nocturia, Painful Urination, Urgency and Urine Leakage.  Vitals Brian Davidson CMA; 10/29/2015 1:21 PM) 10/29/2015 1:20 PM Weight: 224.8 lb Height: 72in Body Surface Area: 2.24 m Body Mass Index: 30.49 kg/m  Temp.: 98.26F(Oral)  Pulse: 72 (Regular)  BP: 138/80 (Sitting, Left Arm, Standard)       Physical Exam Brian Davidson M. Tashiba Timoney Davidson; 10/29/2015 1:46 PM) General Mental Status-Alert. General Appearance-Consistent with stated age. Hydration-Well hydrated. Voice-Normal.  Head and Neck Head-normocephalic, atraumatic with no lesions or palpable masses. Trachea-midline. Thyroid Gland Characteristics - normal size and consistency.  Eye Eyeball - Bilateral-Extraocular movements intact. Sclera/Conjunctiva - Bilateral-No scleral icterus.  Chest and Lung Exam Chest and lung exam reveals -quiet, even and easy respiratory effort with no use of accessory muscles and on auscultation, normal breath sounds, no adventitious sounds and normal vocal resonance. Inspection Chest Wall - Normal. Back - normal. Note: pacemaker   Breast - Did not examine.  Cardiovascular Cardiovascular examination reveals -normal pedal pulses bilaterally. Note: regular rate but irreg rhythm  Abdomen Inspection Inspection of the abdomen reveals - No Hernias. Skin - Scar - no surgical scars. Palpation/Percussion Palpation and Percussion of the abdomen reveal - Soft, Non Tender, No Rebound tenderness, No Rigidity (guarding) and No  hepatosplenomegaly. Auscultation Auscultation of the abdomen reveals - Bowel sounds normal.  Peripheral Vascular Upper Extremity Palpation - Pulses bilaterally normal.  Neurologic Neurologic evaluation reveals -alert and oriented x 3 with no impairment of recent or remote memory. Mental Status-Normal.  Neuropsychiatric The patient's mood and affect are described as -normal. Judgment and Insight-insight is appropriate concerning matters relevant to self.  Musculoskeletal Normal Exam - Left-Upper Extremity Strength Normal and Lower Extremity Strength Normal. Normal Exam - Right-Upper Extremity Strength Normal and Lower Extremity Strength Normal.  Lymphatic Head & Neck  General Head & Neck Lymphatics: Bilateral - Description - Normal. Axillary - Did not examine. Femoral & Inguinal - Did not examine.    Assessment & Plan Brian Davidson M. Dennie Moltz Davidson; 10/29/2015 1:46 PM) BILIARY DYSKINESIA (K82.8) Impression: I believe the patient's symptoms are consistent with gallbladder disease.  We discussed gallbladder disease. The patient was given Neurosurgeon. We discussed non-operative and operative management. We discussed the signs & symptoms of acute cholecystitis  I discussed laparoscopic cholecystectomy with IOC in detail. The patient was given educational material as well as diagrams detailing the procedure. We discussed the risks and benefits of a laparoscopic cholecystectomy including, but not limited to bleeding, infection, injury to surrounding structures such as the intestine or liver, bile leak, retained gallstones, need to convert to an open procedure, prolonged diarrhea, blood clots such as DVT, common bile duct injury, anesthesia risks, and possible need for additional procedures. We discussed the typical post-operative recovery course. I explained that the likelihood of improvement of their symptoms is good. We discussed that he is at slightly higher risk for  bleeding/hematoma.  The patient has elected to proceed with surgery. We discussed that we would need to get cardiac clearance as well as permission to hold his blood thinner perioperatively before scheduling surgery. Current Plans Pt Education - Pamphlet Given - Laparoscopic Gallbladder Surgery: discussed with patient and provided information. Pt Education - [facility]CS Laparosopic Post Op HCI (Gross) I recommended obtaining preoperative cardiac clearance. I am concerned about the health of the patient and the ability to tolerate the operation. Therefore, we will request  clearance by cardiology to better assess operative risk & see if a reevaluation, further workup, etc is needed. Also recommendations on how medications such as for anticoagulation and blood pressure should be managed/held/restarted after surgery.  Leighton Ruff. Redmond Pulling, Davidson, FACS General, Bariatric, & Minimally Invasive Surgery [facility], [place]

## 2015-11-18 NOTE — Patient Instructions (Addendum)
YOUR PROCEDURE IS SCHEDULED ON :  11/24/15  REPORT TO [facility] MAIN ENTRANCE FOLLOW SIGNS TO EAST ELEVATOR - GO TO 3rd FLOOR CHECK IN AT 3 EAST NURSES STATION (SHORT STAY) AT: 5:30 AM  CALL THIS NUMBER IF YOU HAVE PROBLEMS THE MORNING OF SURGERY [phone]  REMEMBER:ONLY 1 PER PERSON MAY GO TO SHORT STAY WITH YOU TO GET READY THE MORNING OF YOUR SURGERY  DO NOT EAT FOOD OR DRINK LIQUIDS AFTER MIDNIGHT  TAKE THESE MEDICINES THE MORNING OF SURGERY: METOPROLOL / DILTIAZEM  STOP ASPIRIN / IBUPROFEN / ALEVE / VITAMINS / HERBAL MEDS __5__ DAYS BEFORE SURGERY  YOU MAY NOT HAVE ANY METAL ON YOUR BODY INCLUDING HAIR PINS AND PIERCING'S. DO NOT WEAR JEWELRY, MAKEUP, LOTIONS, POWDERS OR PERFUMES. DO NOT WEAR NAIL POLISH. DO NOT SHAVE 48 HRS PRIOR TO SURGERY. MEN MAY SHAVE FACE AND NECK.  DO NOT [place]. [facility] IS NOT RESPONSIBLE FOR VALUABLES.  CONTACTS, DENTURES OR PARTIALS MAY NOT BE WORN TO SURGERY. LEAVE SUITCASE IN CAR. CAN BE BROUGHT TO ROOM AFTER SURGERY.  PATIENTS DISCHARGED THE DAY OF SURGERY WILL NOT BE ALLOWED TO DRIVE HOME.  PLEASE READ OVER THE FOLLOWING INSTRUCTION SHEETS _________________________________________________________________________________                                          [facility] - PREPARING FOR SURGERY  Before surgery, you can play an important role.  Because skin is not sterile, your skin needs to be as free of germs as possible.  You can reduce the number of germs on your skin by washing with CHG (chlorahexidine gluconate) soap before surgery.  CHG is an antiseptic cleaner which kills germs and bonds with the skin to continue killing germs even after washing. Please DO NOT use if you have an allergy to CHG or antibacterial soaps.  If your skin becomes reddened/irritated stop using the CHG and inform your nurse when you arrive at Short Stay. Do not shave (including legs and underarms) for at least 48 hours  prior to the first CHG shower.  You may shave your face. Please follow these instructions carefully:   1.  Shower with CHG Soap the night before surgery and the  morning of Surgery.   2.  If you choose to wash your hair, wash your hair first as usual with your  normal  Shampoo.   3.  After you shampoo, rinse your hair and body thoroughly to remove the  shampoo.                                         4.  Use CHG as you would any other liquid soap.  You can apply chg directly  to the skin and wash . Gently wash with scrungie or clean wascloth    5.  Apply the CHG Soap to your body ONLY FROM THE NECK DOWN.   Do not use on open                           Wound or open sores. Avoid contact with eyes, ears mouth and genitals (private parts).  Genitals (private parts) with your normal soap.              6.  Wash thoroughly, paying special attention to the area where your surgery  will be performed.   7.  Thoroughly rinse your body with warm water from the neck down.   8.  DO NOT shower/wash with your normal soap after using and rinsing off  the CHG Soap .                9.  Pat yourself dry with a clean towel.             10.  Wear clean night clothes to bed after shower             11.  Place clean sheets on your bed the night of your first shower and do not  sleep with pets.  Day of Surgery : Do not apply any lotions/deodorants the morning of surgery.  Please wear clean clothes to the hospital/surgery center.  FAILURE TO FOLLOW THESE INSTRUCTIONS MAY RESULT IN THE CANCELLATION OF YOUR SURGERY    PATIENT SIGNATURE_________________________________  ______________________________________________________________________

## 2015-11-19 ENCOUNTER — Encounter (HOSPITAL_COMMUNITY)
Admission: RE | Admit: 2015-11-19 | Discharge: 2015-11-19 | Disposition: A | Discharge status: Home / Self Care | Payer: PPO | Source: Ambulatory Visit | Attending: General Surgery | Admitting: General Surgery

## 2015-11-19 ENCOUNTER — Encounter (HOSPITAL_COMMUNITY): Payer: Self-pay

## 2015-11-19 DIAGNOSIS — K828 Other specified diseases of gallbladder: Secondary | ICD-10-CM | POA: Insufficient documentation

## 2015-11-19 DIAGNOSIS — Z01812 Encounter for preprocedural laboratory examination: Secondary | ICD-10-CM | POA: Diagnosis not present

## 2015-11-19 HISTORY — DX: Gastro-esophageal reflux disease without esophagitis: K21.9

## 2015-11-19 LAB — COMPREHENSIVE METABOLIC PANEL
ALT: 16 U/L — ABNORMAL LOW (ref 17–63)
AST: 21 U/L (ref 15–41)
Albumin: 4.1 g/dL (ref 3.5–5.0)
Alkaline Phosphatase: 70 U/L (ref 38–126)
Anion gap: 8 (ref 5–15)
BUN: 26 mg/dL — ABNORMAL HIGH (ref 6–20)
CALCIUM: 9.2 mg/dL (ref 8.9–10.3)
CHLORIDE: 107 mmol/L (ref 101–111)
CO2: 27 mmol/L (ref 22–32)
CREATININE: 1.33 mg/dL — AB (ref 0.61–1.24)
GFR, EST AFRICAN AMERICAN: 57 mL/min — AB (ref >60)
GFR, EST NON AFRICAN AMERICAN: 50 mL/min — AB (ref >60)
Glucose, Bld: 91 mg/dL (ref 65–99)
POTASSIUM: 4.3 mmol/L (ref 3.5–5.1)
Sodium: 142 mmol/L (ref 135–145)
Total Bilirubin: 1 mg/dL (ref 0.3–1.2)
Total Protein: 7.4 g/dL (ref 6.5–8.1)

## 2015-11-19 LAB — CBC
HCT: 44.7 % (ref 39.0–52.0)
HEMOGLOBIN: 14.9 g/dL (ref 13.0–17.0)
MCH: 30.3 pg (ref 26.0–34.0)
MCHC: 33.3 g/dL (ref 30.0–36.0)
MCV: 90.9 fL (ref 78.0–100.0)
PLATELETS: 159 10*3/uL (ref 150–400)
RBC: 4.92 MIL/uL (ref 4.22–5.81)
RDW: 14.1 % (ref 11.5–15.5)
WBC: 6.9 10*3/uL (ref 4.0–10.5)

## 2015-11-24 ENCOUNTER — Ambulatory Visit (HOSPITAL_COMMUNITY)
Admission: RE | Admit: 2015-11-24 | Discharge: 2015-11-24 | Disposition: A | Discharge status: Home / Self Care | Payer: PPO | Source: Ambulatory Visit | Attending: General Surgery | Admitting: General Surgery

## 2015-11-24 ENCOUNTER — Encounter (HOSPITAL_COMMUNITY): Payer: Self-pay | Admitting: *Deleted

## 2015-11-24 ENCOUNTER — Ambulatory Visit: Payer: PPO | Admitting: Physician Assistant

## 2015-11-24 ENCOUNTER — Ambulatory Visit (HOSPITAL_COMMUNITY): Payer: PPO | Admitting: Certified Registered Nurse Anesthetist

## 2015-11-24 ENCOUNTER — Encounter (HOSPITAL_COMMUNITY)
Admission: RE | Disposition: A | Discharge status: Home / Self Care | Payer: Self-pay | Source: Ambulatory Visit | Attending: General Surgery

## 2015-11-24 ENCOUNTER — Ambulatory Visit: Payer: Medicare Other | Admitting: Family Medicine

## 2015-11-24 DIAGNOSIS — K219 Gastro-esophageal reflux disease without esophagitis: Secondary | ICD-10-CM | POA: Diagnosis not present

## 2015-11-24 DIAGNOSIS — I10 Essential (primary) hypertension: Secondary | ICD-10-CM | POA: Insufficient documentation

## 2015-11-24 DIAGNOSIS — Z79899 Other long term (current) drug therapy: Secondary | ICD-10-CM | POA: Insufficient documentation

## 2015-11-24 DIAGNOSIS — N189 Chronic kidney disease, unspecified: Secondary | ICD-10-CM | POA: Diagnosis not present

## 2015-11-24 DIAGNOSIS — M1990 Unspecified osteoarthritis, unspecified site: Secondary | ICD-10-CM | POA: Insufficient documentation

## 2015-11-24 DIAGNOSIS — Z7901 Long term (current) use of anticoagulants: Secondary | ICD-10-CM | POA: Diagnosis not present

## 2015-11-24 DIAGNOSIS — Z87891 Personal history of nicotine dependence: Secondary | ICD-10-CM | POA: Insufficient documentation

## 2015-11-24 DIAGNOSIS — I482 Chronic atrial fibrillation: Secondary | ICD-10-CM | POA: Insufficient documentation

## 2015-11-24 DIAGNOSIS — Z950 Presence of cardiac pacemaker: Secondary | ICD-10-CM | POA: Diagnosis not present

## 2015-11-24 DIAGNOSIS — K828 Other specified diseases of gallbladder: Secondary | ICD-10-CM | POA: Diagnosis not present

## 2015-11-24 DIAGNOSIS — K811 Chronic cholecystitis: Secondary | ICD-10-CM | POA: Diagnosis not present

## 2015-11-24 DIAGNOSIS — R1011 Right upper quadrant pain: Secondary | ICD-10-CM | POA: Diagnosis not present

## 2015-11-24 DIAGNOSIS — I129 Hypertensive chronic kidney disease with stage 1 through stage 4 chronic kidney disease, or unspecified chronic kidney disease: Secondary | ICD-10-CM | POA: Diagnosis not present

## 2015-11-24 HISTORY — PX: CHOLECYSTECTOMY: SHX55

## 2015-11-24 LAB — PROTIME-INR
INR: 1.2 (ref 0.00–1.49)
PROTHROMBIN TIME: 15.4 s — AB (ref 11.6–15.2)

## 2015-11-24 SURGERY — LAPAROSCOPIC CHOLECYSTECTOMY
Anesthesia: General | Site: Abdomen

## 2015-11-24 MED ORDER — ACETAMINOPHEN 325 MG PO TABS: 650.0000 mg | ORAL_TABLET | ORAL | Status: DC | PRN

## 2015-11-24 MED ORDER — PROPOFOL 10 MG/ML IV BOLUS
INTRAVENOUS | Status: AC
  Filled 2015-11-24: qty 20

## 2015-11-24 MED ORDER — CEFOTETAN DISODIUM-DEXTROSE 2-2.08 GM-% IV SOLR
2.0000 g | INTRAVENOUS | Status: AC
  Administered 2015-11-24: 2 g via INTRAVENOUS

## 2015-11-24 MED ORDER — SUGAMMADEX SODIUM 500 MG/5ML IV SOLN
INTRAVENOUS | Status: AC
  Filled 2015-11-24: qty 5

## 2015-11-24 MED ORDER — LACTATED RINGERS IV SOLN
INTRAVENOUS | Status: DC
  Administered 2015-11-24: 07:00:00 via INTRAVENOUS

## 2015-11-24 MED ORDER — SODIUM CHLORIDE 0.9% FLUSH: 3.0000 mL | Freq: Two times a day (BID) | INTRAVENOUS | Status: DC

## 2015-11-24 MED ORDER — HYDROMORPHONE HCL 1 MG/ML IJ SOLN
INTRAMUSCULAR | Status: AC
  Filled 2015-11-24: qty 1

## 2015-11-24 MED ORDER — PROMETHAZINE HCL 25 MG/ML IJ SOLN
6.2500 mg | INTRAMUSCULAR | Status: DC | PRN
  Administered 2015-11-24: 6.25 mg via INTRAVENOUS
  Filled 2015-11-24: qty 1

## 2015-11-24 MED ORDER — ROCURONIUM BROMIDE 100 MG/10ML IV SOLN
INTRAVENOUS | Status: AC
  Filled 2015-11-24: qty 1

## 2015-11-24 MED ORDER — LIDOCAINE HCL (CARDIAC) 20 MG/ML IV SOLN
INTRAVENOUS | Status: AC
  Filled 2015-11-24: qty 5

## 2015-11-24 MED ORDER — SUCCINYLCHOLINE CHLORIDE 20 MG/ML IJ SOLN
INTRAMUSCULAR | Status: DC | PRN
  Administered 2015-11-24: 100 mg via INTRAVENOUS

## 2015-11-24 MED ORDER — CEFOTETAN DISODIUM-DEXTROSE 2-2.08 GM-% IV SOLR
INTRAVENOUS | Status: AC
  Filled 2015-11-24: qty 50

## 2015-11-24 MED ORDER — SUGAMMADEX SODIUM 200 MG/2ML IV SOLN
INTRAVENOUS | Status: DC | PRN
  Administered 2015-11-24: 300 mg via INTRAVENOUS

## 2015-11-24 MED ORDER — SODIUM CHLORIDE 0.9% FLUSH: 3.0000 mL | INTRAVENOUS | Status: DC | PRN

## 2015-11-24 MED ORDER — ACETAMINOPHEN 650 MG RE SUPP
650.0000 mg | RECTAL | Status: DC | PRN
  Filled 2015-11-24: qty 1

## 2015-11-24 MED ORDER — DEXAMETHASONE SODIUM PHOSPHATE 10 MG/ML IJ SOLN
INTRAMUSCULAR | Status: AC
  Filled 2015-11-24: qty 1

## 2015-11-24 MED ORDER — LIDOCAINE HCL (CARDIAC) 20 MG/ML IV SOLN
INTRAVENOUS | Status: DC | PRN
  Administered 2015-11-24: 100 mg via INTRAVENOUS

## 2015-11-24 MED ORDER — OXYCODONE HCL 5 MG PO TABS: 5.0000 mg | ORAL_TABLET | ORAL | Status: DC | PRN

## 2015-11-24 MED ORDER — BUPIVACAINE-EPINEPHRINE (PF) 0.25% -1:200000 IJ SOLN
INTRAMUSCULAR | Status: AC
  Filled 2015-11-24: qty 30

## 2015-11-24 MED ORDER — ONDANSETRON HCL 4 MG/2ML IJ SOLN
INTRAMUSCULAR | Status: DC | PRN
  Administered 2015-11-24: 4 mg via INTRAVENOUS

## 2015-11-24 MED ORDER — OXYCODONE HCL 5 MG PO TABS
5.0000 mg | ORAL_TABLET | ORAL | Status: DC | PRN
  Administered 2015-11-24: 10 mg via ORAL
  Filled 2015-11-24: qty 2

## 2015-11-24 MED ORDER — LACTATED RINGERS IR SOLN
Status: DC | PRN
  Administered 2015-11-24: 1000 mL

## 2015-11-24 MED ORDER — EPHEDRINE SULFATE 50 MG/ML IJ SOLN
INTRAMUSCULAR | Status: DC | PRN
  Administered 2015-11-24: 10 mg via INTRAVENOUS

## 2015-11-24 MED ORDER — DEXAMETHASONE SODIUM PHOSPHATE 10 MG/ML IJ SOLN
INTRAMUSCULAR | Status: DC | PRN
  Administered 2015-11-24: 10 mg via INTRAVENOUS

## 2015-11-24 MED ORDER — MORPHINE SULFATE (PF) 10 MG/ML IV SOLN: 1.0000 mg | INTRAVENOUS | Status: DC | PRN

## 2015-11-24 MED ORDER — ONDANSETRON HCL 4 MG/2ML IJ SOLN
INTRAMUSCULAR | Status: AC
  Filled 2015-11-24: qty 2

## 2015-11-24 MED ORDER — HYDROMORPHONE HCL 1 MG/ML IJ SOLN
0.2500 mg | INTRAMUSCULAR | Status: DC | PRN
  Administered 2015-11-24 (×3): 0.5 mg via INTRAVENOUS

## 2015-11-24 MED ORDER — FENTANYL CITRATE (PF) 100 MCG/2ML IJ SOLN
INTRAMUSCULAR | Status: DC | PRN
  Administered 2015-11-24 (×3): 50 ug via INTRAVENOUS
  Administered 2015-11-24: 100 ug via INTRAVENOUS

## 2015-11-24 MED ORDER — PROPOFOL 10 MG/ML IV BOLUS
INTRAVENOUS | Status: DC | PRN
Start: 2015-11-24 — End: 2015-11-24
  Administered 2015-11-24: 120 mg via INTRAVENOUS

## 2015-11-24 MED ORDER — ROCURONIUM BROMIDE 100 MG/10ML IV SOLN
INTRAVENOUS | Status: DC | PRN
  Administered 2015-11-24: 5 mg via INTRAVENOUS
  Administered 2015-11-24: 30 mg via INTRAVENOUS

## 2015-11-24 MED ORDER — MEPERIDINE HCL 50 MG/ML IJ SOLN: 6.2500 mg | INTRAMUSCULAR | Status: DC | PRN

## 2015-11-24 MED ORDER — LACTATED RINGERS IV SOLN
INTRAVENOUS | Status: DC
Start: 2015-11-24 — End: 2015-11-24

## 2015-11-24 MED ORDER — FENTANYL CITRATE (PF) 250 MCG/5ML IJ SOLN
INTRAMUSCULAR | Status: AC
  Filled 2015-11-24: qty 5

## 2015-11-24 MED ORDER — BUPIVACAINE-EPINEPHRINE 0.25% -1:200000 IJ SOLN
INTRAMUSCULAR | Status: DC | PRN
  Administered 2015-11-24: 30 mL

## 2015-11-24 MED ORDER — PHENYLEPHRINE HCL 10 MG/ML IJ SOLN
INTRAMUSCULAR | Status: DC | PRN
Start: 2015-11-24 — End: 2015-11-24
  Administered 2015-11-24 (×2): 40 ug via INTRAVENOUS

## 2015-11-24 MED ORDER — SODIUM CHLORIDE 0.9 % IV SOLN: 250.0000 mL | INTRAVENOUS | Status: DC | PRN

## 2015-11-24 MED ORDER — 0.9 % SODIUM CHLORIDE (POUR BTL) OPTIME
TOPICAL | Status: DC | PRN
  Administered 2015-11-24: 1000 mL

## 2015-11-24 SURGICAL SUPPLY — 47 items
ADH SKN CLS APL DERMABOND .7 (GAUZE/BANDAGES/DRESSINGS) ×1
APL SRG 38 LTWT LNG FL B (MISCELLANEOUS)
APPLICATOR ARISTA FLEXITIP XL (MISCELLANEOUS) IMPLANT
APPLIER CLIP 5 13 M/L LIGAMAX5 (MISCELLANEOUS) ×2
APPLIER CLIP ROT 10 11.4 M/L (STAPLE)
APR CLP MED LRG 11.4X10 (STAPLE)
APR CLP MED LRG 5 ANG JAW (MISCELLANEOUS) ×1
BAG SPEC RTRVL 10 TROC 200 (ENDOMECHANICALS) ×1
CABLE HIGH FREQUENCY MONO STRZ (ELECTRODE) ×2 IMPLANT
CHLORAPREP W/TINT 26ML (MISCELLANEOUS) ×2 IMPLANT
CLIP APPLIE 5 13 M/L LIGAMAX5 (MISCELLANEOUS) ×1 IMPLANT
CLIP APPLIE ROT 10 11.4 M/L (STAPLE) IMPLANT
COVER MAYO STAND STRL (DRAPES) IMPLANT
COVER SURGICAL LIGHT HANDLE (MISCELLANEOUS) ×2 IMPLANT
DECANTER SPIKE VIAL GLASS SM (MISCELLANEOUS) ×1 IMPLANT
DERMABOND ADVANCED (GAUZE/BANDAGES/DRESSINGS) ×1
DERMABOND ADVANCED .7 DNX12 (GAUZE/BANDAGES/DRESSINGS) ×1 IMPLANT
DRAPE C-ARM 42X120 X-RAY (DRAPES) IMPLANT
DRAPE LAPAROSCOPIC ABDOMINAL (DRAPES) ×1 IMPLANT
ELECT PENCIL ROCKER SW 15FT (MISCELLANEOUS) ×2 IMPLANT
ELECT REM PT RETURN 9FT ADLT (ELECTROSURGICAL) ×2
ELECTRODE REM PT RTRN 9FT ADLT (ELECTROSURGICAL) ×1 IMPLANT
GLOVE BIO SURGEON STRL SZ7.5 (GLOVE) ×2 IMPLANT
GLOVE INDICATOR 8.0 STRL GRN (GLOVE) ×2 IMPLANT
GOWN STRL REUS W/TWL XL LVL3 (GOWN DISPOSABLE) ×6 IMPLANT
HEMOSTAT ARISTA ABSORB 3G PWDR (MISCELLANEOUS) IMPLANT
HEMOSTAT SNOW SURGICEL 2X4 (HEMOSTASIS) IMPLANT
KIT BASIN OR (CUSTOM PROCEDURE TRAY) ×2 IMPLANT
L-HOOK LAP DISP 36CM (ELECTROSURGICAL) ×2
LHOOK LAP DISP 36CM (ELECTROSURGICAL) ×1 IMPLANT
POSITIONER SURGICAL ARM (MISCELLANEOUS) IMPLANT
POUCH RETRIEVAL ECOSAC 10 (ENDOMECHANICALS) ×1 IMPLANT
POUCH RETRIEVAL ECOSAC 10MM (ENDOMECHANICALS) ×1
SCISSORS LAP 5X35 DISP (ENDOMECHANICALS) ×2 IMPLANT
SET CHOLANGIOGRAPH MIX (MISCELLANEOUS) IMPLANT
SET IRRIG TUBING LAPAROSCOPIC (IRRIGATION / IRRIGATOR) ×2 IMPLANT
SLEEVE XCEL OPT CAN 5 100 (ENDOMECHANICALS) ×4 IMPLANT
SUT MNCRL AB 4-0 PS2 18 (SUTURE) ×2 IMPLANT
SUT VICRYL 0 UR6 27IN ABS (SUTURE) IMPLANT
TAPE CLOTH 4X10 WHT NS (GAUZE/BANDAGES/DRESSINGS) IMPLANT
TOWEL OR 17X26 10 PK STRL BLUE (TOWEL DISPOSABLE) ×2 IMPLANT
TOWEL OR NON WOVEN STRL DISP B (DISPOSABLE) ×2 IMPLANT
TRAY LAPAROSCOPIC (CUSTOM PROCEDURE TRAY) ×2 IMPLANT
TROCAR BLADELESS OPT 5 100 (ENDOMECHANICALS) ×2 IMPLANT
TROCAR XCEL BLUNT TIP 100MML (ENDOMECHANICALS) ×2 IMPLANT
TROCAR XCEL NON-BLD 11X100MML (ENDOMECHANICALS) IMPLANT
TUBING INSUF HEATED (TUBING) ×2 IMPLANT

## 2015-11-24 NOTE — H&P (View-Only) (Signed)
Brian Davidson 10/29/2015 1:16 PM Location: [facility] Patient #: [id number] DOB: 04-03-1936 Married / Language: Brian Davidson / Race: White Male   History of Present Illness [name] M. Brinleigh Tew MD; 10/29/2015 1:49 PM) The patient is a 80 year old male who presents for evaluation of gallbladder disease. He is referred by Dr Havery Moros for evaluation of gallbladder disease. He relates an episode about 3 weeks ago in the middle the night when he awoke with right upper quadrant pain radiating to his back and shoulder. It was quite intense and lasted for several hours but eventually eased up. He reports that another significant flare like that since that initial episode. He ended up going to his primary care physician's office who ordered an abdominal ultrasound which showed no evidence of gallstones. She ended up also undergoing a CT renal stone study which showed no evidence of gallbladder pathology or renal stones. He continued to have intermittent episodes of right-sided right upper quadrant pain rating to his back associated with nausea. These episodes would last 30-40 minutes. They would occur at times after eating. He had a nuclear medicine scan which revealed an abnormal gallbladder ejection fraction at 7.5%. His blood work was essentially normal. He does take xarelto for chronic A. fib. He denies any chest pain, chest pressure, shortness of breath, dizziness or exertion, orthopnea, paroxysmal nocturnal dyspnea, TIAs, or amaurosis fugax. He does have a pacemaker.   Problem List/Past Medical [name] [name], MD; 10/29/2015 1:49 PM) BILIARY DYSKINESIA [phone])  Other Problems Brian Curry, MD; 10/29/2015 1:49 PM) Arthritis Atrial Fibrillation Back Pain Gastroesophageal Reflux Disease Hemorrhoids High blood pressure Inguinal Hernia Kidney Stone  Past Surgical History Brian Davidson, CMA; 10/29/2015 1:16 PM) Foot Surgery Left. Knee Surgery Right. Open Inguinal Hernia  Surgery Left.  Diagnostic Studies History Brian Davidson, [name]; 10/29/2015 1:16 PM) Colonoscopy 1-5 years ago  Allergies Brian Davidson, CMA; 10/29/2015 1:20 PM) Geronimo Boot XT *CALCIUM CHANNEL BLOCKERS* DiltiaZEM CD *CALCIUM CHANNEL BLOCKERS* Sulfabenzamide *CHEMICALS*  Medication History Brian Davidson, CMA; 10/29/2015 1:20 PM) Metoprolol Tartrate (100MG  Tablet, Oral daily) Active. DiltiaZEM CD (240MG  Capsule ER 24HR, Oral daily) Active. Avalide (150-12.5MG  Tablet, Oral daily) Active. Claritin (10MG  Tablet, Oral as needed) Active. Toprol XL (100MG  Tablet ER 24HR, Oral daily) Active. PriLOSEC (20MG  Capsule DR, Oral as needed) Active. Xarelto (20MG  Tablet, Oral as directed) Active. Flagyl (250MG  Tablet, Oral as directed) Active. Medications Reconciled  Social History Brian Davidson, CMA; 10/29/2015 1:16 PM) Alcohol use Occasional alcohol use. Caffeine use Carbonated beverages, Coffee. No drug use Tobacco use Former smoker.  Family History Brian Davidson, [name]; 10/29/2015 1:16 PM) Arthritis Father, Mother. Cerebrovascular Accident Father. Hypertension Father, Mother. Melanoma Father.    Review of Systems Brian Davidson CMA; 10/29/2015 1:16 PM) General Not Present- Appetite Loss, Chills, Fatigue, Fever, Night Sweats, Weight Gain and Weight Loss. Skin Not Present- Change in Wart/Mole, Dryness, Hives, Jaundice, New Lesions, Non-Healing Wounds, Rash and Ulcer. HEENT Present- Hearing Loss and Wears glasses/contact lenses. Not Present- Earache, Hoarseness, Nose Bleed, Oral Ulcers, Ringing in the Ears, Seasonal Allergies, Sinus Pain, Sore Throat, Visual Disturbances and Yellow Eyes. Respiratory Not Present- Bloody sputum, Chronic Cough, Difficulty Breathing, Snoring and Wheezing. Breast Not Present- Breast Mass, Breast Pain, Nipple Discharge and Skin Changes. Cardiovascular Present- Swelling of Extremities. Not Present- Chest Pain, Difficulty Breathing Lying Down, Leg  Cramps, Palpitations, Rapid Heart Rate and Shortness of Breath. Gastrointestinal Present- Change in Bowel Habits, Excessive gas, Hemorrhoids and Nausea. Not Present- Abdominal Pain, Bloating, Bloody Stool, Chronic diarrhea, Constipation, Difficulty Swallowing,  Gets full quickly at meals, Indigestion, Rectal Pain and Vomiting. Male Genitourinary Present- Blood in Urine and Impotence. Not Present- Change in Urinary Stream, Frequency, Nocturia, Painful Urination, Urgency and Urine Leakage.  Vitals Brian Davidson CMA; 10/29/2015 1:21 PM) 10/29/2015 1:20 PM Weight: 224.8 lb Height: 72in Body Surface Area: 2.24 m Body Mass Index: 30.49 kg/m  Temp.: 98.38F(Oral)  Pulse: 72 (Regular)  BP: 138/80 (Sitting, Left Arm, Standard)       Physical Exam [name] M. Delva Derden MD; 10/29/2015 1:46 PM) General Mental Status-Alert. General Appearance-Consistent with stated age. Hydration-Well hydrated. Voice-Normal.  Head and Neck Head-normocephalic, atraumatic with no lesions or palpable masses. Trachea-midline. Thyroid Gland Characteristics - normal size and consistency.  Eye Eyeball - Bilateral-Extraocular movements intact. Sclera/Conjunctiva - Bilateral-No scleral icterus.  Chest and Lung Exam Chest and lung exam reveals -quiet, even and easy respiratory effort with no use of accessory muscles and on auscultation, normal breath sounds, no adventitious sounds and normal vocal resonance. Inspection Chest Wall - Normal. Back - normal. Note: pacemaker   Breast - Did not examine.  Cardiovascular Cardiovascular examination reveals -normal pedal pulses bilaterally. Note: regular rate but irreg rhythm  Abdomen Inspection Inspection of the abdomen reveals - No Hernias. Skin - Scar - no surgical scars. Palpation/Percussion Palpation and Percussion of the abdomen reveal - Soft, Non Tender, No Rebound tenderness, No Rigidity (guarding) and No  hepatosplenomegaly. Auscultation Auscultation of the abdomen reveals - Bowel sounds normal.  Peripheral Vascular Upper Extremity Palpation - Pulses bilaterally normal.  Neurologic Neurologic evaluation reveals -alert and oriented x 3 with no impairment of recent or remote memory. Mental Status-Normal.  Neuropsychiatric The patient's mood and affect are described as -normal. Judgment and Insight-insight is appropriate concerning matters relevant to self.  Musculoskeletal Normal Exam - Left-Upper Extremity Strength Normal and Lower Extremity Strength Normal. Normal Exam - Right-Upper Extremity Strength Normal and Lower Extremity Strength Normal.  Lymphatic Head & Neck  General Head & Neck Lymphatics: Bilateral - Description - Normal. Axillary - Did not examine. Femoral & Inguinal - Did not examine.    Assessment & Plan [name] M. Margean Korell MD; 10/29/2015 1:46 PM) BILIARY DYSKINESIA (K82.8) Impression: I believe the patient's symptoms are consistent with gallbladder disease.  We discussed gallbladder disease. The patient was given Neurosurgeon. We discussed non-operative and operative management. We discussed the signs & symptoms of acute cholecystitis  I discussed laparoscopic cholecystectomy with IOC in detail. The patient was given educational material as well as diagrams detailing the procedure. We discussed the risks and benefits of a laparoscopic cholecystectomy including, but not limited to bleeding, infection, injury to surrounding structures such as the intestine or liver, bile leak, retained gallstones, need to convert to an open procedure, prolonged diarrhea, blood clots such as DVT, common bile duct injury, anesthesia risks, and possible need for additional procedures. We discussed the typical post-operative recovery course. I explained that the likelihood of improvement of their symptoms is good. We discussed that he is at slightly higher risk for  bleeding/hematoma.  The patient has elected to proceed with surgery. We discussed that we would need to get cardiac clearance as well as permission to hold his blood thinner perioperatively before scheduling surgery. Current Plans Pt Education - Pamphlet Given - Laparoscopic Gallbladder Surgery: discussed with patient and provided information. Pt Education - CCS Laparosopic Post Op HCI (Gross) I recommended obtaining preoperative cardiac clearance. I am concerned about the health of the patient and the ability to tolerate the operation. Therefore, we will request  clearance by cardiology to better assess operative risk & see if a reevaluation, further workup, etc is needed. Also recommendations on how medications such as for anticoagulation and blood pressure should be managed/held/restarted after surgery.  Leighton Ruff. Redmond Pulling, MD, FACS General, Bariatric, & Minimally Invasive Surgery [facility], [place]

## 2015-11-24 NOTE — Anesthesia Postprocedure Evaluation (Signed)
Anesthesia Post Note  Patient: Brian Davidson  Procedure(s) Performed: Procedure(s) (LRB): LAPAROSCOPIC CHOLECYSTECTOMY (N/A)  Patient location during evaluation: PACU Anesthesia Type: General Level of consciousness: awake and alert Pain management: pain level controlled Vital Signs Assessment: post-procedure vital signs reviewed and stable Respiratory status: spontaneous breathing, nonlabored ventilation, respiratory function stable and patient connected to nasal cannula oxygen Cardiovascular status: blood pressure returned to baseline and stable Postop Assessment: no signs of nausea or vomiting Anesthetic complications: no    Last Vitals:  Filed Vitals:   11/24/15 0930 11/24/15 0945  BP: 157/84 165/79  Pulse: 62 64  Temp:  36.6 C  Resp: 12 22    Last Pain:  Filed Vitals:   11/24/15 0958  PainSc: 3                  Effie Berkshire

## 2015-11-24 NOTE — Transfer of Care (Signed)
Immediate Anesthesia Transfer of Care Note  Patient: SING HISE  Procedure(s) Performed: Procedure(s): LAPAROSCOPIC CHOLECYSTECTOMY (N/A)  Patient Location: PACU  Anesthesia Type:General  Level of Consciousness:  sedated, patient cooperative and responds to stimulation  Airway & Oxygen Therapy:Patient Spontanous Breathing and Patient connected to face mask oxgen  Post-op Assessment:  Report given to PACU RN and Post -op Vital signs reviewed and stable  Post vital signs:  Reviewed and stable  Last Vitals:  Filed Vitals:   11/24/15 0544  BP: 157/85  Pulse: 62  Temp: 36.7 C  Resp: 20    Complications: No apparent anesthesia complications

## 2015-11-24 NOTE — Interval H&P Note (Signed)
History and Physical Interval Note:  11/24/2015 7:25 AM  Brian Davidson  has presented today for surgery, with the diagnosis of biliary dyskinesia  The various methods of treatment have been discussed with the patient and family. After consideration of risks, benefits and other options for treatment, the patient has consented to  Procedure(s): LAPAROSCOPIC CHOLECYSTECTOMY (N/A) as a surgical intervention .  The patient's history has been reviewed, patient examined, no change in status, stable for surgery.  I have reviewed the patient's chart and labs.  Questions were answered to the patient's satisfaction.    Leighton Ruff. Redmond Pulling, MD, [place], Bariatric, & Minimally Invasive Surgery [facility], [place]   [facility] M

## 2015-11-24 NOTE — Anesthesia Preprocedure Evaluation (Addendum)
Anesthesia Evaluation  Patient identified by MRN, date of birth, ID band Patient awake    Reviewed: Allergy & Precautions, NPO status , Patient's Chart, lab work & pertinent test results, reviewed documented beta blocker date and time   Airway Mallampati: I  TM Distance: >3 FB Neck ROM: Full    Dental  (+) Edentulous Upper, Edentulous Lower   Pulmonary pneumonia, former smoker,    breath sounds clear to auscultation       Cardiovascular hypertension, Pt. on medications and Pt. on home beta blockers + pacemaker + Valvular Problems/Murmurs  Rhythm:Irregular Rate:Normal     Neuro/Psych negative neurological ROS  negative psych ROS   GI/Hepatic Neg liver ROS, GERD  Medicated,  Endo/Other  negative endocrine ROS  Renal/GU Renal disease  negative genitourinary   Musculoskeletal  (+) Arthritis ,   Abdominal   Peds  Hematology negative hematology ROS (+)   Anesthesia Other Findings   Reproductive/Obstetrics negative OB ROS                           01/2015 EKG: atrial fibrillation, pacemaker.   Anesthesia Physical Anesthesia Plan  ASA: III  Anesthesia Plan: General   Post-op Pain Management:    Induction: Intravenous  Airway Management Planned: Oral ETT  Additional Equipment:   Intra-op Plan:   Post-operative Plan: Extubation in OR  Informed Consent: I have reviewed the patients History and Physical, chart, labs and discussed the procedure including the risks, benefits and alternatives for the proposed anesthesia with the patient or authorized representative who has indicated his/her understanding and acceptance.   Dental advisory given  Plan Discussed with: CRNA  Anesthesia Plan Comments:         Anesthesia Quick Evaluation

## 2015-11-24 NOTE — Discharge Instructions (Signed)
[facility] LAPAROSCOPIC [facility]: POST OP INSTRUCTIONS  RESUME XARELTO ON Friday MARCH 24 Always review your discharge instruction sheet given to you by the facility where your surgery was performed. IF YOU HAVE DISABILITY OR FAMILY LEAVE FORMS, YOU MUST BRING THEM TO THE OFFICE FOR PROCESSING.   DO NOT GIVE THEM TO YOUR DOCTOR.  1. A prescription for pain medication may be given to you upon discharge.  Take your pain medication as prescribed, if needed.  If narcotic pain medicine is not needed, then you may take acetaminophen (Tylenol) or ibuprofen (Advil) as needed. 2. Take your usually prescribed medications unless otherwise directed. 3. If you need a refill on your pain medication, please contact your pharmacy.  They will contact our office to request authorization. Prescriptions will not be filled after 5pm or on week-ends. 4. You should follow a light diet the first few days after arrival home, such as soup and crackers, etc.  Be sure to include lots of fluids daily. 5. Most patients will experience some swelling and bruising in the area of the incisions.  Ice packs will help.  Swelling and bruising can take several days to resolve.  6. It is common to experience some constipation if taking pain medication after surgery.  Increasing fluid intake and taking a stool softener (such as Colace) will usually help or prevent this problem from occurring.  A mild laxative (Milk of Magnesia or Miralax) should be taken according to package instructions if there are no bowel movements after 48 hours. 7.   If your surgeon used skin glue on the incision, you may shower in 24 hours.  The glue will flake off over the next 2-3 weeks.  Any sutures or staples will be removed at the office during your follow-up visit. 8. ACTIVITIES:  You may resume regular (light) daily activities beginning the next day--such as daily self-care, walking, climbing stairs--gradually increasing activities as  tolerated.  You may have sexual intercourse when it is comfortable.  Refrain from any heavy lifting or straining until approved by your doctor. a. You may drive when you are no longer taking prescription pain medication, you can comfortably wear a seatbelt, and you can safely maneuver your car and apply brakes. 9. You should see your doctor in the office for a follow-up appointment approximately 2-3 weeks after your surgery.  Make sure that you call for this appointment within a day or two after you arrive home to insure a convenient appointment time. 10. OTHER INSTRUCTIONS:  WHEN TO CALL YOUR DOCTOR: 1. Fever over 101.0 2. Inability to urinate 3. Continued bleeding from incision. 4. Increased pain, redness, or drainage from the incision. 5. Increasing abdominal pain  The clinic staff is available to answer your questions during regular business hours.  Please dont hesitate to call and ask to speak to one of the nurses for clinical concerns.  If you have a medical emergency, go to the nearest emergency room or call 911.  A surgeon from [facility] is always on call at the hospital. [address] [place], [place], [place]  [address] ? [address], [place], [place]   [address] [phone] ? [phone] ? FAX [phone] Web site: [email or web address]

## 2015-11-24 NOTE — Op Note (Signed)
Brian Davidson [id number] 1936/03/14 11/24/2015  Laparoscopic Cholecystectomy Procedure Note  Indications: This patient presents with symptomatic gallbladder disease and will undergo laparoscopic cholecystectomy. He had an abnormal HIDA scan with a low ejection fraction. Please see h&p for additional details.   Pre-operative Diagnosis: biliary dyskinesia  Post-operative Diagnosis: Same  Surgeon: [name]   Assistants: Armandina Gemma MD FACS  Anesthesia: General endotracheal anesthesia  Procedure Details  The patient was seen again in the Holding Room. The risks, benefits, complications, treatment options, and expected outcomes were discussed with the patient. The possibilities of reaction to medication, pulmonary aspiration, perforation of viscus, bleeding, recurrent infection, finding a normal gallbladder, the need for additional procedures, failure to diagnose a condition, the possible need to convert to an open procedure, and creating a complication requiring transfusion or operation were discussed with the patient. The likelihood of improving the patient's symptoms with return to their baseline status is good.  The patient and/or family concurred with the proposed plan, giving informed consent. The site of surgery properly noted. The patient was taken to Operating Room, identified as Brian Davidson and the procedure verified as Laparoscopic Cholecystectomy. A Time Out was held and the above information confirmed. Antibiotic prophylaxis was administered.   Prior to the induction of general anesthesia, antibiotic prophylaxis was administered. General endotracheal anesthesia was then administered and tolerated well. After the induction, the abdomen was prepped with Chloraprep and draped in the sterile fashion. The patient was positioned in the supine position.  Local anesthetic agent was injected into the skin near the umbilicus and an incision made. We dissected down to the abdominal  fascia with blunt dissection.  The fascia was incised vertically and we entered the peritoneal cavity bluntly.  A pursestring suture of 0-Vicryl was placed around the fascial opening.  The Hasson cannula was inserted and secured with the stay suture.  Pneumoperitoneum was then created with CO2 and tolerated well without any adverse changes in the patient's vital signs. An 5-mm port was placed in the subxiphoid position.  Two 5-mm ports were placed in the right upper quadrant. All skin incisions were infiltrated with a local anesthetic agent before making the incision and placing the trocars.   We positioned the patient in reverse Trendelenburg, tilted slightly to the patient's left.  The gallbladder was identified, the fundus grasped and retracted cephalad. Adhesions were lysed bluntly and with the electrocautery where indicated, taking care not to injure any adjacent organs or viscus. The infundibulum was grasped and retracted laterally, exposing the peritoneum overlying the triangle of Calot. This was then divided and exposed in a blunt fashion. A critical view of the cystic duct and cystic artery was obtained.  The cystic duct was clearly identified and bluntly dissected circumferentially.   The cystic duct was then ligated with clips and divided. The cystic artery which had been identified & dissected free was ligated with clips and divided as well.   The gallbladder was dissected from the liver bed in retrograde fashion with the electrocautery. The gallbladder was removed and placed in an Ecco sac.  The gallbladder and Ecco sac were then removed through the umbilical port site. The liver bed was irrigated and inspected. Hemostasis was achieved with the electrocautery. Copious irrigation was utilized and was repeatedly aspirated until clear.  The pursestring suture was used to close the umbilical fascia.    We again inspected the right upper quadrant for hemostasis.  The umbilical closure was inspected  and there was no air  leak and nothing trapped within the closure. Pneumoperitoneum was released as we removed the trocars.  4-0 Monocryl was used to close the skin.   Dermabond was applied. The patient was then extubated and brought to the recovery room in stable condition. Instrument, sponge, and needle counts were correct at closure and at the conclusion of the case.   Findings: Adhesions to GB. +critical view  Estimated Blood Loss: Minimal         Drains: none         Specimens: Gallbladder           Complications: None; patient tolerated the procedure well.         Disposition: PACU - hemodynamically stable.         Condition: stable  Leighton Ruff. Redmond Pulling, MD, FACS General, Bariatric, & Minimally Invasive Surgery [facility], [place]

## 2015-11-24 NOTE — Anesthesia Procedure Notes (Signed)
Procedure Name: Intubation Date/Time: 11/24/2015 7:33 AM Performed by: West Pugh Pre-anesthesia Checklist: Patient identified, Emergency Drugs available, Suction available, Patient being monitored and Timeout performed Patient Re-evaluated:Patient Re-evaluated prior to inductionOxygen Delivery Method: Circle system utilized Preoxygenation: Pre-oxygenation with 100% oxygen Intubation Type: IV induction Ventilation: Two handed mask ventilation required and Oral airway inserted - appropriate to patient size Laryngoscope Size: Mac and 4 Grade View: Grade I Tube type: Oral Tube size: 7.5 mm Number of attempts: 1 Airway Equipment and Method: Stylet Placement Confirmation: ETT inserted through vocal cords under direct vision,  positive ETCO2,  CO2 detector and breath sounds checked- equal and bilateral Secured at: 23 cm Tube secured with: Tape Dental Injury: Teeth and Oropharynx as per pre-operative assessment

## 2015-11-26 ENCOUNTER — Ambulatory Visit (INDEPENDENT_AMBULATORY_CARE_PROVIDER_SITE_OTHER): Payer: PPO | Admitting: *Deleted

## 2015-11-26 DIAGNOSIS — I495 Sick sinus syndrome: Secondary | ICD-10-CM

## 2015-11-26 NOTE — Progress Notes (Signed)
Remote pacemaker transmission.   

## 2015-12-10 ENCOUNTER — Telehealth: Payer: Self-pay | Admitting: Internal Medicine

## 2015-12-10 NOTE — Telephone Encounter (Signed)
New message   Patient calling the office for samples of medication:   1.  What medication and dosage are you requesting samples for? Xarelto 66mh  2.  Are you currently out of this medication? No   donuthole

## 2015-12-11 NOTE — Telephone Encounter (Signed)
He sees [place] yearly for PPM  If he needs can give 2 bottles

## 2015-12-11 NOTE — Telephone Encounter (Signed)
Samples placed at the front desk. Patient aware. 

## 2015-12-11 NOTE — Telephone Encounter (Signed)
Ok to give samples to patient? Per phone note from 11/05/15, patients cardiologist is Dr Claudie Leach.

## 2015-12-16 ENCOUNTER — Emergency Department (HOSPITAL_COMMUNITY): Admission: EM | Admit: 2015-12-16 | Discharge: 2015-12-16 | Discharge status: Against Medical Advice | Payer: PPO

## 2015-12-16 ENCOUNTER — Encounter (HOSPITAL_BASED_OUTPATIENT_CLINIC_OR_DEPARTMENT_OTHER): Payer: Self-pay | Admitting: *Deleted

## 2015-12-16 ENCOUNTER — Emergency Department (HOSPITAL_BASED_OUTPATIENT_CLINIC_OR_DEPARTMENT_OTHER)
Admission: EM | Admit: 2015-12-16 | Discharge: 2015-12-16 | Disposition: A | Discharge status: Home / Self Care | Payer: PPO | Attending: Emergency Medicine | Admitting: Emergency Medicine

## 2015-12-16 ENCOUNTER — Emergency Department (HOSPITAL_BASED_OUTPATIENT_CLINIC_OR_DEPARTMENT_OTHER): Payer: PPO

## 2015-12-16 DIAGNOSIS — Z8701 Personal history of pneumonia (recurrent): Secondary | ICD-10-CM | POA: Insufficient documentation

## 2015-12-16 DIAGNOSIS — Z7901 Long term (current) use of anticoagulants: Secondary | ICD-10-CM | POA: Insufficient documentation

## 2015-12-16 DIAGNOSIS — N12 Tubulo-interstitial nephritis, not specified as acute or chronic: Secondary | ICD-10-CM | POA: Diagnosis not present

## 2015-12-16 DIAGNOSIS — K5730 Diverticulosis of large intestine without perforation or abscess without bleeding: Secondary | ICD-10-CM | POA: Diagnosis not present

## 2015-12-16 DIAGNOSIS — Z87442 Personal history of urinary calculi: Secondary | ICD-10-CM | POA: Insufficient documentation

## 2015-12-16 DIAGNOSIS — K219 Gastro-esophageal reflux disease without esophagitis: Secondary | ICD-10-CM | POA: Diagnosis not present

## 2015-12-16 DIAGNOSIS — R011 Cardiac murmur, unspecified: Secondary | ICD-10-CM | POA: Diagnosis not present

## 2015-12-16 DIAGNOSIS — M1990 Unspecified osteoarthritis, unspecified site: Secondary | ICD-10-CM | POA: Diagnosis not present

## 2015-12-16 DIAGNOSIS — I10 Essential (primary) hypertension: Secondary | ICD-10-CM | POA: Diagnosis not present

## 2015-12-16 DIAGNOSIS — Z87891 Personal history of nicotine dependence: Secondary | ICD-10-CM | POA: Diagnosis not present

## 2015-12-16 DIAGNOSIS — I4891 Unspecified atrial fibrillation: Secondary | ICD-10-CM | POA: Diagnosis not present

## 2015-12-16 DIAGNOSIS — Z79899 Other long term (current) drug therapy: Secondary | ICD-10-CM | POA: Diagnosis not present

## 2015-12-16 DIAGNOSIS — R109 Unspecified abdominal pain: Secondary | ICD-10-CM | POA: Diagnosis not present

## 2015-12-16 LAB — URINALYSIS, ROUTINE W REFLEX MICROSCOPIC
Bilirubin Urine: NEGATIVE
GLUCOSE, UA: NEGATIVE mg/dL
Ketones, ur: NEGATIVE mg/dL
Nitrite: NEGATIVE
Protein, ur: NEGATIVE mg/dL
SPECIFIC GRAVITY, URINE: 1.018 (ref 1.005–1.030)
pH: 6.5 (ref 5.0–8.0)

## 2015-12-16 LAB — URINE MICROSCOPIC-ADD ON

## 2015-12-16 MED ORDER — ONDANSETRON HCL 4 MG/2ML IJ SOLN
4.0000 mg | Freq: Once | INTRAMUSCULAR | Status: AC
  Administered 2015-12-16: 4 mg via INTRAVENOUS
  Filled 2015-12-16: qty 2

## 2015-12-16 MED ORDER — CIPROFLOXACIN HCL 500 MG PO TABS: 500.0000 mg | ORAL_TABLET | Freq: Two times a day (BID) | ORAL | Status: DC

## 2015-12-16 MED ORDER — OXYCODONE-ACETAMINOPHEN 5-325 MG PO TABS: 2.0000 | ORAL_TABLET | ORAL | Status: DC | PRN

## 2015-12-16 MED ORDER — DEXTROSE 5 % IV SOLN
1.0000 g | Freq: Once | INTRAVENOUS | Status: AC
  Administered 2015-12-16: 1 g via INTRAVENOUS
  Filled 2015-12-16: qty 10

## 2015-12-16 MED ORDER — HYDROMORPHONE HCL 1 MG/ML IJ SOLN
1.0000 mg | Freq: Once | INTRAMUSCULAR | Status: AC
  Administered 2015-12-16: 1 mg via INTRAVENOUS
  Filled 2015-12-16: qty 1

## 2015-12-16 MED ORDER — SODIUM CHLORIDE 0.9 % IV SOLN
INTRAVENOUS | Status: DC
  Administered 2015-12-16: 04:00:00 via INTRAVENOUS

## 2015-12-16 NOTE — ED Notes (Signed)
Patient transported to CT 

## 2015-12-16 NOTE — ED Notes (Signed)
MD at bedside. 

## 2015-12-16 NOTE — Discharge Instructions (Signed)

## 2015-12-16 NOTE — ED Notes (Signed)
Pt and wife given d/c instructions as per chart. Rx x 2. Verbalizes understanding. No questions. 

## 2015-12-16 NOTE — ED Notes (Signed)
Pt c/o left flank pain radiating to LLQ onset 2 days ago. +nausea. Hx hematuria and stones and will be seeing Urologist next month.

## 2015-12-16 NOTE — ED Provider Notes (Signed)
CSN: [place]:[id number]     Arrival date & time 12/16/15  [id number] History   First MD Initiated Contact with Patient 12/16/15 0345     Chief Complaint  Patient presents with  . Flank Pain     (Consider location/radiation/quality/duration/timing/severity/associated sxs/prior Treatment) HPI  This is a 80 year old male with a history of kidney stones. He is here with left flank pain radiating to the left lower quadrant that began 2 days ago. The pain was mild and intermittent until about 1 AM today when it acutely worsened. He rates his pain as 8 out of 10 presently. There is some change with movement but he cannot find a comfortable position. He has had nausea but no vomiting. He has not noticed blood in his urine.  Past Medical History  Diagnosis Date  . Hypertension   . Atrial fibrillation with slow ventricular response (HCC)     long standing persistent atrial fibrillation  . Heart murmur   . Pneumonia 1992  . Arthritis   . Chronic anticoagulation   . Tachycardia-bradycardia [facility])     s/p PPM  . Kidney stone 03/2015  . Biliary dyskinesia   . GERD (gastroesophageal reflux disease)     occasionally   Past Surgical History  Procedure Laterality Date  . Fracture surgery  2005    left ankle  . Pacemaker insertion  01/31/12    SJM Accent SR RF implanted by DR Allred  . Permanent pacemaker insertion N/A 01/31/2012    Procedure: PERMANENT PACEMAKER INSERTION;  Surgeon: Thompson Grayer, MD;  Location: [facility] CATH LAB;  Service: Cardiovascular;  Laterality: N/A;  . Cystoscopy  2016  . Cholecystectomy N/A 11/24/2015    Procedure: LAPAROSCOPIC CHOLECYSTECTOMY;  Surgeon: Greer Pickerel, MD;  Location: [facility] ORS;  Service: General;  Laterality: N/A;   Family History  Problem Relation Age of Onset  . Hypertension Mother   . Colon cancer Neg Hx   . Stroke Father    Social History  Substance Use Topics  . Smoking status: Former Smoker -- 10 years    Types: Pipe, Cigars    Quit date: 11/18/1985  . Smokeless  tobacco: Never Used  . Alcohol Use: 1.8 oz/week    3 Cans of beer per week     Comment: occ    Review of Systems  All other systems reviewed and are negative.   Allergies  Diltiazem hcl; Sulfa antibiotics; and Cartia xt  Home Medications   Prior to Admission medications   Medication Sig Start Date End Date Taking? Authorizing Provider  diltiazem (CARDIZEM CD) 240 MG 24 hr capsule Take 240 mg by mouth daily.    Historical Provider, MD  irbesartan-hydrochlorothiazide (AVALIDE) 150-12.5 MG per tablet Take 0.5 tablets by mouth daily.     Historical Provider, MD  loratadine (CLARITIN) 10 MG tablet Take 10 mg by mouth daily as needed. For seasonal allergies    Historical Provider, MD  metoprolol (LOPRESSOR) 100 MG tablet Take 100 mg by mouth 2 (two) times daily.  09/28/15   Historical Provider, MD  omeprazole (PRILOSEC) 20 MG capsule Take 1 capsule (20 mg total) by mouth every morning. 30 mins prior to breakfast. Patient taking differently: Take 20 mg by mouth daily before breakfast.  10/07/15   Lori P Hvozdovic, PA-C  oxyCODONE (OXY IR/ROXICODONE) 5 MG immediate release tablet Take 1-2 tablets (5-10 mg total) by mouth every 4 (four) hours as needed for moderate pain, severe pain or breakthrough pain. 11/24/15   Greer Pickerel, MD  Rivaroxaban (  XARELTO) 20 MG TABS Take 1 tablet (20 mg total) by mouth daily. 01/31/13   Thompson Grayer, MD  traMADol (ULTRAM) 50 MG tablet Take 1 tablet (50 mg total) by mouth every 6 (six) hours as needed for moderate pain. 11/06/15   Edward Saguier, PA-C   BP 164/96 mmHg  Pulse 64  Temp(Src) 97.7 F (36.5 C) (Oral)  Resp 18  Ht 6' (1.829 m)  Wt 224 lb (101.606 kg)  BMI 30.37 kg/m2  SpO2 100%   Physical Exam  General: Well-developed, well-nourished male in no acute distress; appearance consistent with age of record HENT: normocephalic; atraumatic Eyes: pupils equal, round and reactive to light; extraocular muscles intact Neck: supple Heart: regular rate and  rhythm Lungs: clear to auscultation bilaterally Abdomen: soft; nondistended; nontender; no masses or hepatosplenomegaly; bowel sounds present GU: Mild left CVA tenderness Extremities: No deformity; full range of motion; pulses normal Neurologic: Awake, alert and oriented; motor function intact in all extremities and symmetric; no facial droop Skin: Warm and dry Psychiatric: Normal mood and affect    ED Course  Procedures (including critical care time)   MDM  Nursing notes and vitals signs, including pulse oximetry, reviewed.  Summary of this visit's results, reviewed by myself:  Labs:  Results for orders placed or performed during the hospital encounter of 12/16/15 (from the past 24 hour(s))  Urinalysis, Routine w reflex microscopic (not at [facility])     Status: Abnormal   Collection Time: 12/16/15  3:40 AM  Result Value Ref Range   Color, Urine YELLOW YELLOW   APPearance CLEAR CLEAR   Specific Gravity, Urine 1.018 1.005 - 1.030   pH 6.5 5.0 - 8.0   Glucose, UA NEGATIVE NEGATIVE mg/dL   Hgb urine dipstick SMALL (A) NEGATIVE   Bilirubin Urine NEGATIVE NEGATIVE   Ketones, ur NEGATIVE NEGATIVE mg/dL   Protein, ur NEGATIVE NEGATIVE mg/dL   Nitrite NEGATIVE NEGATIVE   Leukocytes, UA MODERATE (A) NEGATIVE  Urine microscopic-add on     Status: Abnormal   Collection Time: 12/16/15  3:40 AM  Result Value Ref Range   Squamous Epithelial / LPF 0-5 (A) NONE SEEN   WBC, UA 6-30 0 - 5 WBC/hpf   RBC / HPF 0-5 0 - 5 RBC/hpf   Bacteria, UA RARE (A) NONE SEEN    Imaging Studies: Ct Renal Stone Study  12/16/2015  CLINICAL DATA:  Left flank pain for 2 days.  Hematuria. EXAM: CT ABDOMEN AND PELVIS WITHOUT CONTRAST TECHNIQUE: Multidetector CT imaging of the abdomen and pelvis was performed following the standard protocol without IV contrast. COMPARISON:  None. FINDINGS: There are no ureteral calculi. There is no hydronephrosis or ureteral dilatation. There are unremarkable unenhanced appearances  of both kidneys. There is cholecystectomy. There are unremarkable unenhanced appearances of the liver, spleen, pancreas and adrenals. The stomach and small bowel are normal. Colon is remarkable only for extensive diverticulosis. The abdominal aorta is normal in caliber. There is mild atherosclerotic calcification. There is no adenopathy in the abdomen or pelvis. No acute inflammatory changes are evident in the abdomen or pelvis. There is no ascites. There is no significant abnormality in the lower chest. There is no significant musculoskeletal lesion. IMPRESSION: 1. No acute findings are evident in the abdomen or pelvis. 2. Diverticulosis. Electronically Signed   By: Andreas Newport M.D.   On: 12/16/2015 04:34   4:43 AM Patient pain-free after IV medications. Will treat patient for pyelonephritis given location of pain and urinalysis consistent with infection.  Shanon Rosser, MD 12/16/15 [phone]

## 2015-12-18 LAB — URINE CULTURE

## 2015-12-24 ENCOUNTER — Ambulatory Visit: Payer: PPO | Admitting: Internal Medicine

## 2015-12-25 ENCOUNTER — Encounter: Payer: Self-pay | Admitting: Cardiology

## 2015-12-25 LAB — CUP PACEART REMOTE DEVICE CHECK
Battery Remaining Longevity: 143 mo
Date Time Interrogation Session: 20170323065109
Implantable Lead Implant Date: 20130528
Lead Channel Impedance Value: 650 Ohm
Lead Channel Sensing Intrinsic Amplitude: 12 mV
Lead Channel Setting Pacing Pulse Width: 0.4 ms
MDC IDC LEAD LOCATION: 753860
MDC IDC LEAD MODEL: 1948
MDC IDC MSMT BATTERY REMAINING PERCENTAGE: 95.5 %
MDC IDC MSMT BATTERY VOLTAGE: 2.98 V
MDC IDC SET LEADCHNL RV PACING AMPLITUDE: 2.5 V
MDC IDC SET LEADCHNL RV SENSING SENSITIVITY: 2 mV
MDC IDC STAT BRADY RV PERCENT PACED: 11 %
Pulse Gen Serial Number: 7328888

## 2015-12-30 ENCOUNTER — Telehealth: Payer: Self-pay | Admitting: *Deleted

## 2015-12-30 ENCOUNTER — Encounter: Payer: Self-pay | Admitting: *Deleted

## 2015-12-30 NOTE — Telephone Encounter (Signed)
Pre-Visit Call completed with patient and chart updated.   Pre-Visit Info documented in Specialty Comments under [employer].    

## 2015-12-31 ENCOUNTER — Ambulatory Visit (INDEPENDENT_AMBULATORY_CARE_PROVIDER_SITE_OTHER): Payer: PPO | Admitting: Family Medicine

## 2015-12-31 ENCOUNTER — Encounter: Payer: Self-pay | Admitting: Family Medicine

## 2015-12-31 VITALS — BP 118/72 | HR 73 | Temp 98.2°F | Ht 72.0 in | Wt 222.5 lb

## 2015-12-31 DIAGNOSIS — I4821 Permanent atrial fibrillation: Secondary | ICD-10-CM

## 2015-12-31 DIAGNOSIS — M8580 Other specified disorders of bone density and structure, unspecified site: Secondary | ICD-10-CM

## 2015-12-31 DIAGNOSIS — I10 Essential (primary) hypertension: Secondary | ICD-10-CM | POA: Diagnosis not present

## 2015-12-31 DIAGNOSIS — L989 Disorder of the skin and subcutaneous tissue, unspecified: Secondary | ICD-10-CM

## 2015-12-31 DIAGNOSIS — M5441 Lumbago with sciatica, right side: Secondary | ICD-10-CM

## 2015-12-31 DIAGNOSIS — Z0000 Encounter for general adult medical examination without abnormal findings: Secondary | ICD-10-CM | POA: Diagnosis not present

## 2015-12-31 DIAGNOSIS — N201 Calculus of ureter: Secondary | ICD-10-CM

## 2015-12-31 DIAGNOSIS — Z8619 Personal history of other infectious and parasitic diseases: Secondary | ICD-10-CM | POA: Insufficient documentation

## 2015-12-31 DIAGNOSIS — M545 Low back pain, unspecified: Secondary | ICD-10-CM | POA: Insufficient documentation

## 2015-12-31 DIAGNOSIS — I482 Chronic atrial fibrillation: Secondary | ICD-10-CM

## 2015-12-31 DIAGNOSIS — T7840XA Allergy, unspecified, initial encounter: Secondary | ICD-10-CM

## 2015-12-31 DIAGNOSIS — M549 Dorsalgia, unspecified: Secondary | ICD-10-CM

## 2015-12-31 LAB — LIPID PANEL
Cholesterol: 155 mg/dL (ref 0–200)
HDL: 33.9 mg/dL — ABNORMAL LOW (ref >39.00)
LDL Cholesterol: 102 mg/dL — ABNORMAL HIGH (ref 0–99)
NONHDL: 120.61
Total CHOL/HDL Ratio: 5
Triglycerides: 92 mg/dL (ref 0.0–149.0)
VLDL: 18.4 mg/dL (ref 0.0–40.0)

## 2015-12-31 LAB — CBC
HCT: 43.9 % (ref 39.0–52.0)
Hemoglobin: 14.7 g/dL (ref 13.0–17.0)
MCHC: 33.6 g/dL (ref 30.0–36.0)
MCV: 88.4 fl (ref 78.0–100.0)
PLATELETS: 149 10*3/uL — AB (ref 150.0–400.0)
RBC: 4.97 Mil/uL (ref 4.22–5.81)
RDW: 14.5 % (ref 11.5–15.5)
WBC: 5.8 10*3/uL (ref 4.0–10.5)

## 2015-12-31 LAB — COMPREHENSIVE METABOLIC PANEL
ALT: 18 U/L (ref 0–53)
AST: 20 U/L (ref 0–37)
Albumin: 4 g/dL (ref 3.5–5.2)
Alkaline Phosphatase: 82 U/L (ref 39–117)
BILIRUBIN TOTAL: 0.8 mg/dL (ref 0.2–1.2)
BUN: 27 mg/dL — ABNORMAL HIGH (ref 6–23)
CALCIUM: 9.1 mg/dL (ref 8.4–10.5)
CO2: 27 meq/L (ref 19–32)
CREATININE: 1.32 mg/dL (ref 0.40–1.50)
Chloride: 108 mEq/L (ref 96–112)
GFR: 55.71 mL/min — ABNORMAL LOW (ref >60.00)
GLUCOSE: 108 mg/dL — AB (ref 70–99)
Potassium: 3.6 mEq/L (ref 3.5–5.1)
Sodium: 142 mEq/L (ref 135–145)
Total Protein: 7.2 g/dL (ref 6.0–8.3)

## 2015-12-31 LAB — TSH: TSH: 1.31 u[IU]/mL (ref 0.35–4.50)

## 2015-12-31 MED ORDER — FLUTICASONE PROPIONATE 50 MCG/ACT NA SUSP: 2.0000 | Freq: Every day | NASAL | Status: DC

## 2015-12-31 NOTE — Assessment & Plan Note (Signed)
Follows with [facility] and has an appt with APP later this week. Patient denies any concerning symptoms

## 2015-12-31 NOTE — Assessment & Plan Note (Signed)
Right posterior shoulder, and sun damaged skin, referred to dermatology for further consideration

## 2015-12-31 NOTE — Patient Instructions (Addendum)
Vitamin D 2000 IU daily NOW 10 strain probiotic cap 1 daily Fish or Kril oil caps daily Nasal saline flushes twice daily Hypertension, commonly called high blood pressure, is when the force of blood pumping through your arteries is too strong. Your arteries are the blood vessels that carry blood from your heart throughout your body. A blood pressure reading consists of a higher number over a lower number, such as 110/72. The higher number (systolic) is the pressure inside your arteries when your heart pumps. The lower number (diastolic) is the pressure inside your arteries when your heart relaxes. Ideally you want your blood pressure below 120/80. Hypertension forces your heart to work harder to pump blood. Your arteries may become narrow or stiff. Having untreated or uncontrolled hypertension can cause heart attack, stroke, kidney disease, and other problems. RISK FACTORS Some risk factors for high blood pressure are controllable. Others are not.  Risk factors you cannot control include:   Race. You may be at higher risk if you are African American.  Age. Risk increases with age.  Gender. Men are at higher risk than women before age 81 years. After age 64, women are at higher risk than men. Risk factors you can control include:  Not getting enough exercise or physical activity.  Being overweight.  Getting too much fat, sugar, calories, or salt in your diet.  Drinking too much alcohol. SIGNS AND SYMPTOMS Hypertension does not usually cause signs or symptoms. Extremely high blood pressure (hypertensive crisis) may cause headache, anxiety, shortness of breath, and nosebleed. DIAGNOSIS To check if you have hypertension, your health care provider will measure your blood pressure while you are seated, with your arm held at the level of your heart. It should be measured at least twice using the same arm. Certain conditions can cause a difference in blood pressure between your right and left  arms. A blood pressure reading that is higher than normal on one occasion does not mean that you need treatment. If it is not clear whether you have high blood pressure, you may be asked to return on a different day to have your blood pressure checked again. Or, you may be asked to monitor your blood pressure at home for 1 or more weeks. TREATMENT Treating high blood pressure includes making lifestyle changes and possibly taking medicine. Living a healthy lifestyle can help lower high blood pressure. You may need to change some of your habits. Lifestyle changes may include:  Following the DASH diet. This diet is high in fruits, vegetables, and whole grains. It is low in salt, red meat, and added sugars.  Keep your sodium intake below 2,300 mg per day.  Getting at least 30-45 minutes of aerobic exercise at least 4 times per week.  Losing weight if necessary.  Not smoking.  Limiting alcoholic beverages.  Learning ways to reduce stress. Your health care provider may prescribe medicine if lifestyle changes are not enough to get your blood pressure under control, and if one of the following is true:  You are 21-68 years of age and your systolic blood pressure is above 140.  You are 38 years of age or older, and your systolic blood pressure is above 150.  Your diastolic blood pressure is above 90.  You have diabetes, and your systolic blood pressure is over [id number] or your diastolic blood pressure is over 90.  You have kidney disease and your blood pressure is above 140/90.  You have heart disease and your blood pressure  is above 140/90. Your personal target blood pressure may vary depending on your medical conditions, your age, and other factors. HOME CARE INSTRUCTIONS  Have your blood pressure rechecked as directed by your health care provider.   Take medicines only as directed by your health care provider. Follow the directions carefully. Blood pressure medicines must be taken as  prescribed. The medicine does not work as well when you skip doses. Skipping doses also puts you at risk for problems.  Do not smoke.   Monitor your blood pressure at home as directed by your health care provider. SEEK MEDICAL CARE IF:   You think you are having a reaction to medicines taken.  You have recurrent headaches or feel dizzy.  You have swelling in your ankles.  You have trouble with your vision. SEEK IMMEDIATE MEDICAL CARE IF:  You develop a severe headache or confusion.  You have unusual weakness, numbness, or feel faint.  You have severe chest or abdominal pain.  You vomit repeatedly.  You have trouble breathing. MAKE SURE YOU:   Understand these instructions.  Will watch your condition.  Will get help right away if you are not doing well or get worse.   This information is not intended to replace advice given to you by your health care provider. Make sure you discuss any questions you have with your health care provider.   Document Released: 08/22/2005 Document Revised: 01/06/2015 Document Reviewed: 06/14/2013 Elsevier Interactive Patient Education [employer].

## 2015-12-31 NOTE — Progress Notes (Signed)
Pre visit review using our clinic review tool, if applicable. No additional management support is needed unless otherwise documented below in the visit note. 

## 2015-12-31 NOTE — Assessment & Plan Note (Signed)
Well controlled, no changes to meds. Encouraged heart healthy diet such as the DASH diet and exercise as tolerated.  °

## 2015-12-31 NOTE — Assessment & Plan Note (Signed)
Is doing well at this time but did have a spike with a UTI in recent past

## 2015-12-31 NOTE — Assessment & Plan Note (Signed)
Recent pyelonephritis, treated in ED with antibioitcs and feels better is agreeable to probiotics

## 2015-12-31 NOTE — Assessment & Plan Note (Signed)
Continue Claritin, add flonase and nasal saline.

## 2015-12-31 NOTE — Assessment & Plan Note (Signed)
Encouraged to get adequate exercise, calcium and vitamin d intake 

## 2015-12-31 NOTE — Progress Notes (Signed)
Subjective:    Patient ID: Brian Davidson, male    DOB: 1936/08/19, 80 y.o.   MRN: [id number]  Chief Complaint  Patient presents with  . Establish Care    HPI Patient is in today for establish care.  Patient reports having gallbladder surgery about a month ago, but has been doing well overall.  Patient has a pacemaker that he follows up with Dr. Rayann Heman about.  Patient reports he gets colonoscopy every 5 years and is not due until next year, has not had a primary care doctor in the past but was previously seeing Dr. Lawson Radar over at [place].  Has never had a primary care doctor but PMH significant for AFib, HTN, heart murmur, allergies, OA, renal nephrolithiasis and more. Denies CP/palp/SOB/HA/congestion/fevers/GI or GU c/o. Taking meds as prescribed   Past Medical History  Diagnosis Date  . Hypertension   . Atrial fibrillation with slow ventricular response (HCC)     long standing persistent atrial fibrillation  . Heart murmur   . Pneumonia 1992  . Arthritis   . Chronic anticoagulation   . Tachycardia-bradycardia [facility])     s/p PPM  . Kidney stone 03/2015  . Biliary dyskinesia   . GERD (gastroesophageal reflux disease)     occasionally  . Back pain 12/31/2015  . History of chicken pox   . H/O measles     Past Surgical History  Procedure Laterality Date  . Pacemaker insertion  01/31/12    SJM Accent SR RF implanted by DR Allred  . Permanent pacemaker insertion N/A 01/31/2012    Procedure: PERMANENT PACEMAKER INSERTION;  Surgeon: Thompson Grayer, MD;  Location: [facility] CATH LAB;  Service: Cardiovascular;  Laterality: N/A;  . Cystoscopy  2016  . Cholecystectomy N/A 11/24/2015    Procedure: LAPAROSCOPIC CHOLECYSTECTOMY;  Surgeon: Greer Pickerel, MD;  Location: [facility] ORS;  Service: General;  Laterality: N/A;  . Fracture surgery  2005    left ankle  . Hernia repair      left groin, no mesh    Family History  Problem Relation Age of Onset  . Hypertension Mother   .  Arthritis Mother   . Drug abuse Mother   . Colon cancer Neg Hx   . Stroke Father   . Heart disease Father   . Heart disease Sister 38  . Heart disease Brother     afib  . Hypertension Son   . Heart disease Sister     afib  . Neuropathy Brother   . Hypertension Brother     Social History   Social History  . Marital Status: Married    Spouse Name: [name]  . Number of Children: 1  . Years of Education: N/A   Occupational History  . Retired   . Part-time driver for car company    Social History Main Topics  . Smoking status: Former Smoker -- 10 years    Types: Pipe, Cigars    Quit date: 11/18/1985  . Smokeless tobacco: Never Used  . Alcohol Use: 1.8 oz/week    3 Cans of beer per week     Comment: occ  . Drug Use: No  . Sexual Activity: Yes   Other Topics Concern  . Not on file   Social History Narrative   Lives with wife, still works part-time as a Geophysicist/field seismologist. He is active around the house and yard...   Parents are deceased and did not have cardiac issues but he has 2 siblings with  atrial fibrillation and a sister-in-law  has a pacemaker.    Outpatient Prescriptions Prior to Visit  Medication Sig Dispense Refill  . diltiazem (CARDIZEM CD) 240 MG 24 hr capsule Take 240 mg by mouth daily.    . irbesartan-hydrochlorothiazide (AVALIDE) 150-12.5 MG per tablet Take 0.5 tablets by mouth daily.     . metoprolol (LOPRESSOR) 100 MG tablet Take 100 mg by mouth 2 (two) times daily.     . Rivaroxaban (XARELTO) 20 MG TABS Take 1 tablet (20 mg total) by mouth daily. 30 tablet   . loratadine (CLARITIN) 10 MG tablet Take 10 mg by mouth daily as needed. Reported on 12/31/2015     No facility-administered medications prior to visit.    Allergies  Allergen Reactions  . Sulfa Antibiotics     Unknown childhood reaction  . Cartia Xt [Diltiazem Hcl Er Beads] Rash    Rash from red dye in generic capsule. Can take different brand. Takes diltiazem - his allergy is to Cardizem    Review  of Systems  Constitutional: Negative for fever and malaise/fatigue.  HENT: Negative for congestion.   Eyes: Negative for blurred vision.  Respiratory: Negative for shortness of breath.   Cardiovascular: Negative for chest pain, palpitations and leg swelling.  Gastrointestinal: Negative for nausea, abdominal pain and blood in stool.  Genitourinary: Negative for dysuria and frequency.  Musculoskeletal: Negative for falls.  Skin: Negative for rash.  Neurological: Negative for dizziness, loss of consciousness and headaches.  Endo/Heme/Allergies: Negative for environmental allergies.  Psychiatric/Behavioral: Negative for depression. The patient is not nervous/anxious.        Objective:    Physical Exam  Constitutional: He is oriented to person, place, and time. He appears well-developed and well-nourished. No distress.  HENT:  Head: Normocephalic and atraumatic.  Eyes: Conjunctivae are normal.  Neck: Neck supple. No thyromegaly present.  Cardiovascular: Normal rate and regular rhythm.   Murmur heard. Pulmonary/Chest: Effort normal and breath sounds normal. No respiratory distress. He has no wheezes.  Abdominal: Soft. Bowel sounds are normal. He exhibits no mass. There is no tenderness.  Musculoskeletal: He exhibits no edema.  Lymphadenopathy:    He has no cervical adenopathy.  Neurological: He is alert and oriented to person, place, and time.  Skin: Skin is warm and dry.  Psychiatric: He has a normal mood and affect. His behavior is normal.    BP 118/72 mmHg  Pulse 73  Temp(Src) 98.2 F (36.8 C) (Oral)  Ht 6' (1.829 m)  Wt 222 lb 8 oz (100.925 kg)  BMI 30.17 kg/m2  SpO2 97% Wt Readings from Last 3 Encounters:  12/31/15 222 lb 8 oz (100.925 kg)  12/16/15 224 lb (101.606 kg)  11/24/15 225 lb (102.059 kg)     Lab Results  Component Value Date   WBC 6.9 11/19/2015   HGB 14.9 11/19/2015   HCT 44.7 11/19/2015   PLT 159 11/19/2015   GLUCOSE 91 11/19/2015   CHOL 157  01/31/2012   TRIG 108 01/31/2012   HDL 34* 01/31/2012   LDLCALC 101* 01/31/2012   ALT 16* 11/19/2015   AST 21 11/19/2015   NA 142 11/19/2015   K 4.3 11/19/2015   CL 107 11/19/2015   CREATININE 1.33* 11/19/2015   BUN 26* 11/19/2015   CO2 27 11/19/2015   TSH 1.357 01/30/2012   INR 1.20 11/24/2015    Lab Results  Component Value Date   TSH 1.357 01/30/2012   Lab Results  Component Value Date   WBC  6.9 11/19/2015   HGB 14.9 11/19/2015   HCT 44.7 11/19/2015   MCV 90.9 11/19/2015   PLT 159 11/19/2015   Lab Results  Component Value Date   NA 142 11/19/2015   K 4.3 11/19/2015   CO2 27 11/19/2015   GLUCOSE 91 11/19/2015   BUN 26* 11/19/2015   CREATININE 1.33* 11/19/2015   BILITOT 1.0 11/19/2015   ALKPHOS 70 11/19/2015   AST 21 11/19/2015   ALT 16* 11/19/2015   PROT 7.4 11/19/2015   ALBUMIN 4.1 11/19/2015   CALCIUM 9.2 11/19/2015   ANIONGAP 8 11/19/2015   GFR 58.29* 09/24/2015   Lab Results  Component Value Date   CHOL 157 01/31/2012   Lab Results  Component Value Date   HDL 34* 01/31/2012   Lab Results  Component Value Date   LDLCALC 101* 01/31/2012   Lab Results  Component Value Date   TRIG 108 01/31/2012   Lab Results  Component Value Date   CHOLHDL 4.6 01/31/2012   No results found for: HGBA1C     Assessment & Plan:   Problem List Items Addressed This Visit    Allergic state    Continue Claritin, add flonase and nasal saline.      Relevant Medications   fluticasone (FLONASE) 50 MCG/ACT nasal spray   Back pain    Is doing well at this time but did have a spike with a UTI in recent past      Calculi, ureter - Primary    Recent pyelonephritis, treated in ED with antibioitcs and feels better is agreeable to probiotics      Relevant Orders   Lipid panel   TSH   CBC   Comprehensive metabolic panel   Hypertension    Well controlled, no changes to meds. Encouraged heart healthy diet such as the DASH diet and exercise as tolerated.        Relevant Orders   Lipid panel   TSH   CBC   Comprehensive metabolic panel   Osteopenia    Encouraged to get adequate exercise, calcium and vitamin d intake      Relevant Orders   Lipid panel   TSH   CBC   Comprehensive metabolic panel   Permanent atrial fibrillation ([place])    Follows with [facility] and has an appt with APP later this week. Patient denies any concerning symptoms      Skin lesion    Right posterior shoulder, and sun damaged skin, referred to dermatology for further consideration      Relevant Orders   Ambulatory referral to Dermatology    Other Visit Diagnoses    Preventative health care        Relevant Orders    Lipid panel    TSH    CBC    Comprehensive metabolic panel       I am having Mr. Meziere start on fluticasone. I am also having him maintain his irbesartan-hydrochlorothiazide, loratadine, rivaroxaban, diltiazem, and metoprolol.  Meds ordered this encounter  Medications  . fluticasone (FLONASE) 50 MCG/ACT nasal spray    Sig: Place 2 sprays into both nostrils daily.    Dispense:  16 g    Refill:  6     [name], MD

## 2016-01-05 ENCOUNTER — Encounter: Payer: Self-pay | Admitting: *Deleted

## 2016-01-05 DIAGNOSIS — H26493 Other secondary cataract, bilateral: Secondary | ICD-10-CM | POA: Diagnosis not present

## 2016-01-05 DIAGNOSIS — H04123 Dry eye syndrome of bilateral lacrimal glands: Secondary | ICD-10-CM | POA: Diagnosis not present

## 2016-01-06 ENCOUNTER — Encounter: Payer: Self-pay | Admitting: Gastroenterology

## 2016-01-06 ENCOUNTER — Ambulatory Visit (INDEPENDENT_AMBULATORY_CARE_PROVIDER_SITE_OTHER): Payer: PPO | Admitting: Gastroenterology

## 2016-01-06 VITALS — BP 128/76 | HR 64 | Ht 72.0 in | Wt 224.8 lb

## 2016-01-06 DIAGNOSIS — Z86010 Personal history of colonic polyps: Secondary | ICD-10-CM

## 2016-01-06 DIAGNOSIS — Z9049 Acquired absence of other specified parts of digestive tract: Secondary | ICD-10-CM | POA: Diagnosis not present

## 2016-01-06 NOTE — Progress Notes (Signed)
HPI :  80 y/o male initially presented with RUQ pain which radiated to the R shoulder. Seen by PA Hvozdevic. Patient was started on a trial of PPI and levsin, and referred for a HIDA scan. HIDA was abnormal with findings of biliary dyskinesia. He was subsequently referred to general surgery and underwent cholecystectomy March 21st. He is doinhg well since the surgery. He has not had any abdominal pain since the surgery, this has entirely resolved and quite happy with the result. Eating well. No pain after eating at all.  He had some mild diarrhea after the procedure which resolved.   He reports otherwise having had a prior colonoscopy in 2010, in Jan 2012. He denies any blood in the stools. Regular bowel habits. No FH of colon cancer. He takes Xarelto for a history of atrial fibrillation. He has never had prior bleeding due to this. HE has a history of hematuria. He has some testing pending for this per Urology. No weight loss. He thinks he had a prior colonoscopy in 2013 but results not available.   Endoscopic history: Colonoscopy on 09/20/2010 per Dr. Ferdinand Lango. At that time he had 3 polyps removed. Bowel prep was felt to be inadequate. View of colonic mucosa was inadequate. Patient was advised to repeat colonoscopy in 1 year with a 2 day double prep. Biopsies revealed a mid transverse colon polyp is hyperplastic containing benign lymphoid aggregate. A mid transverse colon polyp was a tubular adenoma. Proximal transverse colon polyp was hyperplastic. The patient was advised to have her surveillance colonoscopy in January 2013.  Further notes received later show patient had a colonoscopy on 06/29/2009  - 5 polyps were removed. Bowel prep was inadequate. View of colonic mucosa was inadequate. Pathology revealed a cecal polyp to be a tubular adenoma. Proximal transverse colon polyp was a tubular adenoma. Mid transverse colon polyp was a tubular adenoma. Distal transverse colon polyp was hyperplastic negative  for dysplasia Splenic flexure colon polyp was hyperplastic negative for dysplasia.   Past Medical History  Diagnosis Date  . Hypertension   . Atrial fibrillation with slow ventricular response (HCC)     long standing persistent atrial fibrillation  . Heart murmur   . Pneumonia 1992  . Arthritis   . Chronic anticoagulation   . Tachycardia-bradycardia [facility])     s/p PPM  . Kidney stone 03/2015  . Biliary dyskinesia   . GERD (gastroesophageal reflux disease)     occasionally  . Back pain 12/31/2015  . History of chicken pox   . H/O measles      Past Surgical History  Procedure Laterality Date  . Pacemaker insertion  01/31/12    SJM Accent SR RF implanted by DR Allred  . Permanent pacemaker insertion N/A 01/31/2012    Procedure: PERMANENT PACEMAKER INSERTION;  Surgeon: Thompson Grayer, MD;  Location: [facility] CATH LAB;  Service: Cardiovascular;  Laterality: N/A;  . Cystoscopy  2016  . Cholecystectomy N/A 11/24/2015    Procedure: LAPAROSCOPIC CHOLECYSTECTOMY;  Surgeon: Greer Pickerel, MD;  Location: [facility] ORS;  Service: General;  Laterality: N/A;  . Fracture surgery  2005    left ankle  . Hernia repair      left groin, no mesh   Family History  Problem Relation Age of Onset  . Hypertension Mother   . Arthritis Mother   . Drug abuse Mother   . Colon cancer Neg Hx   . Stroke Father   . Heart disease Father   . Heart disease Sister  75  . Heart disease Brother   . Hypertension Son   . Heart disease Sister   . Neuropathy Brother   . Hypertension Brother   . Atrial fibrillation Brother   . Atrial fibrillation Sister    Social History  Substance Use Topics  . Smoking status: Former Smoker -- 10 years    Types: Pipe, Cigars    Quit date: 11/18/1985  . Smokeless tobacco: Never Used  . Alcohol Use: 1.8 oz/week    3 Cans of beer per week     Comment: occ   Current Outpatient Prescriptions  Medication Sig Dispense Refill  . diltiazem (CARDIZEM CD) 240 MG 24 hr capsule Take 240 mg by mouth  daily.    . fluticasone (FLONASE) 50 MCG/ACT nasal spray Place 2 sprays into both nostrils daily. 16 g 6  . irbesartan-hydrochlorothiazide (AVALIDE) 150-12.5 MG per tablet Take 0.5 tablets by mouth daily.     Marland Kitchen loratadine (CLARITIN) 10 MG tablet Take 10 mg by mouth daily as needed. Reported on 12/31/2015    . metoprolol (LOPRESSOR) 100 MG tablet Take 100 mg by mouth 2 (two) times daily.     . Rivaroxaban (XARELTO) 20 MG TABS Take 1 tablet (20 mg total) by mouth daily. 30 tablet    No current facility-administered medications for this visit.   Allergies  Allergen Reactions  . Sulfa Antibiotics     Unknown childhood reaction  . Cartia Xt [Diltiazem Hcl Er Beads] Rash    Rash from red dye in generic capsule. Can take different brand. Takes diltiazem - his allergy is to Cardizem     Review of Systems: All systems reviewed and negative except where noted in HPI.    Ct Renal Stone Study  12/16/2015  CLINICAL DATA:  Left flank pain for 2 days.  Hematuria. EXAM: CT ABDOMEN AND PELVIS WITHOUT CONTRAST TECHNIQUE: Multidetector CT imaging of the abdomen and pelvis was performed following the standard protocol without IV contrast. COMPARISON:  None. FINDINGS: There are no ureteral calculi. There is no hydronephrosis or ureteral dilatation. There are unremarkable unenhanced appearances of both kidneys. There is cholecystectomy. There are unremarkable unenhanced appearances of the liver, spleen, pancreas and adrenals. The stomach and small bowel are normal. Colon is remarkable only for extensive diverticulosis. The abdominal aorta is normal in caliber. There is mild atherosclerotic calcification. There is no adenopathy in the abdomen or pelvis. No acute inflammatory changes are evident in the abdomen or pelvis. There is no ascites. There is no significant abnormality in the lower chest. There is no significant musculoskeletal lesion. IMPRESSION: 1. No acute findings are evident in the abdomen or pelvis.  2. Diverticulosis. Electronically Signed   By: Andreas Newport M.D.   On: 12/16/2015 04:34    Physical Exam: BP 128/76 mmHg  Pulse 64  Ht 6' (1.829 m)  Wt 224 lb 12.8 oz (101.969 kg)  BMI 30.48 kg/m2 Constitutional: Pleasant,well-developed, male in no acute distress. HEENT: Normocephalic and atraumatic. Conjunctivae are normal. No scleral icterus. Neck supple.  Cardiovascular: Irregularly irregular Pulmonary/chest: Effort normal and breath sounds normal. No wheezing, rales or rhonchi. Abdominal: Soft, nondistended, nontender. Bowel sounds active throughout. There are no masses palpable. No hepatomegaly. Extremities: no edema Lymphadenopathy: No cervical adenopathy noted. Neurological: Alert and oriented to person place and time. Skin: Skin is warm and dry. No rashes noted. Psychiatric: Normal mood and affect. Behavior is normal.   ASSESSMENT AND PLAN: 80 y/o male here for follow up for the following issues:  RUQ pain / biliary colic - due to GB dyskinesia, now resolved following cholecystectomy and doing well. He can follow up as needed.   History of colon polyps - as outlined above, prior colonoscopies have been limited by poor prep. It was recommended that he have a follow up exam in 2013, the patient thinks he had this done with a good prep but will need to obtain records to determine when he is due (this year or next). He will need a 2 day prep for his next exam, and will need to hold Xarelto prior to the procedure.. Formal recommendations for timing will be made once we have these records. He agreed. Once scheduled will contact his prescribing provider of Xarelto for clearance to hold it. He agreed.   [place] Cellar, MD [facility] Pager [phone]

## 2016-01-06 NOTE — Patient Instructions (Signed)
We will obtain your colonoscopy report from [facility] Dr Ferdinand Lango Dr Havery Moros will review the report and we will contact you when your next colonoscopy is due

## 2016-01-07 ENCOUNTER — Encounter: Payer: Self-pay | Admitting: Nurse Practitioner

## 2016-01-07 NOTE — Progress Notes (Signed)
Electrophysiology Office Note Date: 01/08/2016  ID:  Brian Davidson, DOB [birth year], MRN [id number]  PCP: [name], MD Primary Cardiologist: Claudie Leach - would like to change to [place] provider Electrophysiologist: Allred  CC: Pacemaker follow-up  Brian Davidson is a 80 y.o. male seen today for Dr Rayann Heman.  He presents today for routine electrophysiology followup.  Since last being seen in our clinic, the patient reports doing very well. He remains very active. He has not had bleeding problems on Xarelto. He has been followed by [facility] but would like to change his general cardiology care to [facility] for continuity as all of his other providers are part of [facility].  He denies chest pain, palpitations, dyspnea, PND, orthopnea, nausea, vomiting, dizziness, syncope, edema, weight gain, or early satiety.  Device History: STJ dual chamber PPM implanted 2013 for tachy-brady   Past Medical History  Diagnosis Date  . Hypertension   . Persistent atrial fibrillation ([place])   . Pneumonia 1992  . Arthritis   . Tachycardia-bradycardia ([place])     a. s/p STJ dual chamber pacemaker  . Kidney stone 03/2015  . Biliary dyskinesia   . GERD (gastroesophageal reflux disease)   . History of chicken pox   . H/O measles    Past Surgical History  Procedure Laterality Date  . Permanent pacemaker insertion N/A 01/31/2012    SJM Accent SR RF implanted by DR Allred  . Cystoscopy  2016  . Cholecystectomy N/A 11/24/2015    Procedure: LAPAROSCOPIC CHOLECYSTECTOMY;  Surgeon: Greer Pickerel, MD;  Location: [facility] ORS;  Service: General;  Laterality: N/A;  . Fracture surgery  2005    left ankle  . Hernia repair      left groin, no mesh    Current Outpatient Prescriptions  Medication Sig Dispense Refill  . diltiazem (CARDIZEM CD) 240 MG 24 hr capsule Take 240 mg by mouth daily.    . fluticasone (FLONASE) 50 MCG/ACT nasal spray Place 2 sprays into both nostrils daily. 16 g 6  .  irbesartan-hydrochlorothiazide (AVALIDE) 150-12.5 MG per tablet Take 0.5 tablets by mouth daily.     Marland Kitchen loratadine (CLARITIN) 10 MG tablet Take 10 mg by mouth daily as needed. Reported on 12/31/2015    . metoprolol (LOPRESSOR) 100 MG tablet Take 100 mg by mouth 2 (two) times daily.     . Rivaroxaban (XARELTO) 20 MG TABS Take 1 tablet (20 mg total) by mouth daily. 30 tablet    No current facility-administered medications for this visit.    Allergies:   Sulfa antibiotics and Cartia xt   Social History: Social History   Social History  . Marital Status: Married    Spouse Name: Brian Davidson  . Number of Children: 1  . Years of Education: N/A   Occupational History  . Retired   . Part-time driver for car company    Social History Main Topics  . Smoking status: Former Smoker -- 10 years    Types: Pipe, Cigars    Quit date: 11/18/1985  . Smokeless tobacco: Never Used  . Alcohol Use: 1.8 oz/week    3 Cans of beer per week     Comment: occ  . Drug Use: No  . Sexual Activity: Yes   Other Topics Concern  . Not on file   Social History Narrative   Lives with wife, still works part-time as a Geophysicist/field seismologist. He is active around the house and yard...   Parents are deceased and did not have  cardiac issues but he has 2 siblings with atrial fibrillation and a sister-in-law  has a pacemaker.    Family History: Family History  Problem Relation Age of Onset  . Hypertension Mother   . Arthritis Mother   . Drug abuse Mother   . Colon cancer Neg Hx   . Stroke Father   . Heart disease Father   . Heart disease Sister 32  . Heart disease Brother   . Hypertension Son   . Heart disease Sister   . Neuropathy Brother   . Hypertension Brother   . Atrial fibrillation Brother   . Atrial fibrillation Sister      Review of Systems: All other systems reviewed and are otherwise negative except as noted above.   Physical Exam: VS:  BP 140/84 mmHg  Pulse 79  Ht 6' (1.829 m)  Wt 225 lb (102.059 kg)  BMI  30.51 kg/m2 , BMI Body mass index is 30.51 kg/(m^2).  GEN- The patient is well appearing, alert and oriented x 3 today.   HEENT: normocephalic, atraumatic; sclera clear, conjunctiva pink; hearing intact; oropharynx clear; neck supple  Lungs- Clear to ausculation bilaterally, normal work of breathing.  No wheezes, rales, rhonchi Heart- Irregular rate and rhythm, 2/6 systolic murmur GI- soft, non-tender, non-distended, bowel sounds present  Extremities- no clubbing, cyanosis, or edema; DP/PT/radial pulses 2+ bilaterally MS- no significant deformity or atrophy Skin- warm and dry, no rash or lesion; PPM pocket well healed Psych- euthymic mood, full affect Neuro- strength and sensation are intact  PPM Interrogation- reviewed in detail today,  See PACEART report  EKG:  EKG is ordered today. The ekg ordered today shows rate controlled AF  Recent Labs: 12/31/2015: ALT 18; BUN 27*; Creatinine, Ser 1.32; Hemoglobin 14.7; Platelets 149.0*; Potassium 3.6; Sodium 142; TSH 1.31   Wt Readings from Last 3 Encounters:  01/08/16 225 lb (102.059 kg)  01/06/16 224 lb 12.8 oz (101.969 kg)  12/31/15 222 lb 8 oz (100.925 kg)     Other studies Reviewed: Additional studies/ records that were reviewed today include: Dr Jackalyn Lombard office notes  Assessment and Plan:  1.  Tachy/brady syndrome Normal PPM function See Pace Art report No changes today  2.  Permanent atrial fibrillation Continue Xarelto for CHADS2VASC of 3 Recent CBC, BMET stable  3.  HTN Stable No change required today  4.  Moderate MR Would like to change to [facility] for general cardiology for continuity of care Will refer to Dr Oval Linsey to be seen in 6 months - pt had echo scheduled with [facility] for October, he will cancel that and see Dr Oval Linsey to allow her to evaluate need for repeat echo    Current medicines are reviewed at length with the patient today.   The patient does not have concerns regarding his  medicines.  The following changes were made today:  none  Labs/ tests ordered today include: none   Disposition:   Follow up with [name], Dr Rayann Heman 1 year, Dr Oval Linsey 6 months to establish   Signed, Chanetta Marshall, NP 01/08/2016 10:05 AM  [facility] [address] [place] [address] [phone] (office) [phone] (fax)

## 2016-01-08 ENCOUNTER — Encounter: Payer: Self-pay | Admitting: Internal Medicine

## 2016-01-08 ENCOUNTER — Ambulatory Visit (INDEPENDENT_AMBULATORY_CARE_PROVIDER_SITE_OTHER): Payer: PPO | Admitting: Nurse Practitioner

## 2016-01-08 ENCOUNTER — Encounter: Payer: Self-pay | Admitting: Nurse Practitioner

## 2016-01-08 VITALS — BP 140/84 | HR 79 | Ht 72.0 in | Wt 225.0 lb

## 2016-01-08 DIAGNOSIS — I10 Essential (primary) hypertension: Secondary | ICD-10-CM

## 2016-01-08 DIAGNOSIS — I482 Chronic atrial fibrillation: Secondary | ICD-10-CM | POA: Diagnosis not present

## 2016-01-08 DIAGNOSIS — I495 Sick sinus syndrome: Secondary | ICD-10-CM

## 2016-01-08 DIAGNOSIS — I4821 Permanent atrial fibrillation: Secondary | ICD-10-CM

## 2016-01-08 NOTE — Patient Instructions (Signed)
Medication Instructions:   Your physician recommends that you continue on your current medications as directed. Please refer to the Current Medication list given to you today.   If you need a refill on your cardiac medications before your next appointment, please call your pharmacy.  Labwork: NONE ORDER TODAY    Testing/Procedures: NONE ORDER TODAY    Follow-Up: Your physician wants you to follow-up in:  IN  6  MONTHS WITH [facility]   You will receive a reminder letter in the mail two months in advance. If you don't receive a letter, please call our office to schedule the follow-up appointment.  Your physician wants you to follow-up in: [place] will receive a reminder letter in the mail two months in advance. If you don't receive a letter, please call our office to schedule the follow-up appointment.  Remote monitoring is used to monitor your Pacemaker of ICD from home. This monitoring reduces the number of office visits required to check your device to one time per year. It allows [place] to keep an eye on the functioning of your device to ensure it is working properly. You are scheduled for a device check from home on . 04/11/2016..You may send your transmission at any time that day. If you have a wireless device, the transmission will be sent automatically. After your physician reviews your transmission, you will receive a postcard with your next transmission date.     Any Other Special Instructions Will Be Listed Below (If Applicable).

## 2016-02-15 DIAGNOSIS — N402 Nodular prostate without lower urinary tract symptoms: Secondary | ICD-10-CM | POA: Diagnosis not present

## 2016-02-15 DIAGNOSIS — R3121 Asymptomatic microscopic hematuria: Secondary | ICD-10-CM | POA: Diagnosis not present

## 2016-04-08 ENCOUNTER — Telehealth: Payer: Self-pay | Admitting: Cardiology

## 2016-04-08 ENCOUNTER — Ambulatory Visit (INDEPENDENT_AMBULATORY_CARE_PROVIDER_SITE_OTHER): Payer: PPO | Admitting: *Deleted

## 2016-04-08 ENCOUNTER — Telehealth: Payer: Self-pay | Admitting: Internal Medicine

## 2016-04-08 DIAGNOSIS — I495 Sick sinus syndrome: Secondary | ICD-10-CM | POA: Diagnosis not present

## 2016-04-08 NOTE — Telephone Encounter (Signed)
Spoke w/ pt and attempted to help him send a manual transmission. But his home monitor started malfunctioning. Instructed pt to call tech services. Pt verbalized understanding.

## 2016-04-08 NOTE — Telephone Encounter (Signed)
Spoke w/ pt and he is not home right now. He is going to call back either later today or on Monday to get help w/ his monitor.

## 2016-04-08 NOTE — Telephone Encounter (Signed)
New Message  Pt calling requesting to speak with RN. Pt states his device is beeping and he unable to stop the noise. Please call back to advise

## 2016-04-08 NOTE — Telephone Encounter (Signed)
Spoke with pt and reminded pt of remote transmission that is due today. Pt verbalized understanding.   

## 2016-04-11 NOTE — Progress Notes (Signed)
Remote pacemaker transmission.   

## 2016-04-13 ENCOUNTER — Encounter: Payer: Self-pay | Admitting: Cardiology

## 2016-04-29 ENCOUNTER — Encounter: Payer: Self-pay | Admitting: Cardiology

## 2016-05-02 LAB — CUP PACEART REMOTE DEVICE CHECK
Date Time Interrogation Session: 20170804191834
Lead Channel Impedance Value: 640 Ohm
Lead Channel Pacing Threshold Amplitude: 0.5 V
MDC IDC LEAD IMPLANT DT: 20130528
MDC IDC LEAD LOCATION: 753860
MDC IDC LEAD MODEL: 1948
MDC IDC MSMT BATTERY REMAINING LONGEVITY: 142 mo
MDC IDC MSMT BATTERY REMAINING PERCENTAGE: 95.5 %
MDC IDC MSMT BATTERY VOLTAGE: 2.98 V
MDC IDC MSMT LEADCHNL RV PACING THRESHOLD PULSEWIDTH: 0.4 ms
MDC IDC MSMT LEADCHNL RV SENSING INTR AMPL: 12 mV
MDC IDC SET LEADCHNL RV PACING AMPLITUDE: 2.5 V
MDC IDC SET LEADCHNL RV PACING PULSEWIDTH: 0.4 ms
MDC IDC SET LEADCHNL RV SENSING SENSITIVITY: 2 mV
MDC IDC STAT BRADY RV PERCENT PACED: 9.2 %
Pulse Gen Model: 1210
Pulse Gen Serial Number: 7328888

## 2016-05-10 ENCOUNTER — Telehealth: Payer: Self-pay | Admitting: Internal Medicine

## 2016-05-10 NOTE — Telephone Encounter (Signed)
Dr Myna Hidalgo his cardiologist has been giving him xarelto & filling it,  i reffered him back to that doctor, pt expressed understanding.

## 2016-05-10 NOTE — Telephone Encounter (Signed)
New message  ° ° °Patient calling the office for samples of medication: ° ° °1.  What medication and dosage are you requesting samples for? xarelto  20 mg  ° °2.  Are you currently out of this medication? Yes  ° ° °

## 2016-05-23 ENCOUNTER — Encounter: Payer: Self-pay | Admitting: Family Medicine

## 2016-05-24 ENCOUNTER — Other Ambulatory Visit: Payer: Self-pay | Admitting: Family Medicine

## 2016-05-24 MED ORDER — DILTIAZEM HCL ER COATED BEADS 240 MG PO CP24: 240.0000 mg | ORAL_CAPSULE | Freq: Every day | ORAL | 6 refills | Status: DC

## 2016-06-08 DIAGNOSIS — R3121 Asymptomatic microscopic hematuria: Secondary | ICD-10-CM | POA: Diagnosis not present

## 2016-06-08 DIAGNOSIS — N402 Nodular prostate without lower urinary tract symptoms: Secondary | ICD-10-CM | POA: Diagnosis not present

## 2016-07-01 ENCOUNTER — Ambulatory Visit (INDEPENDENT_AMBULATORY_CARE_PROVIDER_SITE_OTHER): Payer: PPO | Admitting: Family Medicine

## 2016-07-01 ENCOUNTER — Other Ambulatory Visit (HOSPITAL_BASED_OUTPATIENT_CLINIC_OR_DEPARTMENT_OTHER): Payer: PPO

## 2016-07-01 ENCOUNTER — Encounter: Payer: Self-pay | Admitting: Family Medicine

## 2016-07-01 VITALS — BP 128/72 | HR 80 | Temp 98.3°F | Ht 73.0 in | Wt 223.4 lb

## 2016-07-01 DIAGNOSIS — I739 Peripheral vascular disease, unspecified: Secondary | ICD-10-CM

## 2016-07-01 DIAGNOSIS — I059 Rheumatic mitral valve disease, unspecified: Secondary | ICD-10-CM | POA: Diagnosis not present

## 2016-07-01 DIAGNOSIS — I779 Disorder of arteries and arterioles, unspecified: Secondary | ICD-10-CM

## 2016-07-01 DIAGNOSIS — M8580 Other specified disorders of bone density and structure, unspecified site: Secondary | ICD-10-CM

## 2016-07-01 DIAGNOSIS — R739 Hyperglycemia, unspecified: Secondary | ICD-10-CM | POA: Diagnosis not present

## 2016-07-01 DIAGNOSIS — I7771 Dissection of carotid artery: Secondary | ICD-10-CM | POA: Diagnosis not present

## 2016-07-01 DIAGNOSIS — N201 Calculus of ureter: Secondary | ICD-10-CM

## 2016-07-01 DIAGNOSIS — E782 Mixed hyperlipidemia: Secondary | ICD-10-CM

## 2016-07-01 DIAGNOSIS — I10 Essential (primary) hypertension: Secondary | ICD-10-CM | POA: Diagnosis not present

## 2016-07-01 DIAGNOSIS — I38 Endocarditis, valve unspecified: Secondary | ICD-10-CM

## 2016-07-01 DIAGNOSIS — I4891 Unspecified atrial fibrillation: Secondary | ICD-10-CM | POA: Diagnosis not present

## 2016-07-01 HISTORY — DX: Endocarditis, valve unspecified: I38

## 2016-07-01 HISTORY — DX: Disorder of arteries and arterioles, unspecified: I77.9

## 2016-07-01 LAB — CBC
HEMATOCRIT: 46 % (ref 39.0–52.0)
Hemoglobin: 15.3 g/dL (ref 13.0–17.0)
MCHC: 33.2 g/dL (ref 30.0–36.0)
MCV: 89.9 fl (ref 78.0–100.0)
PLATELETS: 151 10*3/uL (ref 150.0–400.0)
RBC: 5.11 Mil/uL (ref 4.22–5.81)
RDW: 14.6 % (ref 11.5–15.5)
WBC: 6.5 10*3/uL (ref 4.0–10.5)

## 2016-07-01 LAB — LIPID PANEL
CHOLESTEROL: 163 mg/dL (ref 0–200)
HDL: 37.8 mg/dL — ABNORMAL LOW (ref >39.00)
LDL CALC: 102 mg/dL — AB (ref 0–99)
NonHDL: 125.01
TRIGLYCERIDES: 113 mg/dL (ref 0.0–149.0)
Total CHOL/HDL Ratio: 4
VLDL: 22.6 mg/dL (ref 0.0–40.0)

## 2016-07-01 LAB — COMPREHENSIVE METABOLIC PANEL
ALBUMIN: 4 g/dL (ref 3.5–5.2)
ALT: 14 U/L (ref 0–53)
AST: 19 U/L (ref 0–37)
Alkaline Phosphatase: 66 U/L (ref 39–117)
BUN: 29 mg/dL — ABNORMAL HIGH (ref 6–23)
CALCIUM: 9.4 mg/dL (ref 8.4–10.5)
CHLORIDE: 106 meq/L (ref 96–112)
CO2: 26 mEq/L (ref 19–32)
CREATININE: 1.3 mg/dL (ref 0.40–1.50)
GFR: 56.63 mL/min — ABNORMAL LOW (ref >60.00)
Glucose, Bld: 94 mg/dL (ref 70–99)
Potassium: 3.9 mEq/L (ref 3.5–5.1)
Sodium: 140 mEq/L (ref 135–145)
Total Bilirubin: 1 mg/dL (ref 0.2–1.2)
Total Protein: 7.2 g/dL (ref 6.0–8.3)

## 2016-07-01 LAB — HEMOGLOBIN A1C: Hgb A1c MFr Bld: 5.7 % (ref 4.6–6.5)

## 2016-07-01 MED ORDER — RIVAROXABAN 20 MG PO TABS: 20.0000 mg | ORAL_TABLET | Freq: Every day | ORAL | 3 refills | Status: DC

## 2016-07-01 MED ORDER — METOPROLOL TARTRATE 100 MG PO TABS: 100.0000 mg | ORAL_TABLET | Freq: Two times a day (BID) | ORAL | 3 refills | Status: DC

## 2016-07-01 NOTE — Assessment & Plan Note (Signed)
Doing well, asymptomatic, tolerating xarelto

## 2016-07-01 NOTE — Assessment & Plan Note (Signed)
Encouraged to get adequate exercise, calcium and vitamin d intake 

## 2016-07-01 NOTE — Progress Notes (Signed)
Patient ID: EXTON SUHRE, male   DOB: [birth year], 80 y.o.   MRN: [id number]   Subjective:    Patient ID: HARDEEP CHESS, male    DOB: 09-18-1935, 80 y.o.   MRN: [id number]  Chief Complaint  Patient presents with  . Follow-up    HPI Patient is in today for follow up. Is feeling well today. No recent illness or acute concerns. Has continued to follow with cardiology and urology but has had to recent medication changes. Denies CP/palp/SOB/HA/congestion/fevers/GI or GU c/o. Taking meds as prescribed  Past Medical History:  Diagnosis Date  . Arthritis   . Biliary dyskinesia   . Carotid artery disease ([place]) 07/01/2016  . GERD (gastroesophageal reflux disease)   . H/O measles   . History of chicken pox   . Hypertension   . Kidney stone 03/2015  . Persistent atrial fibrillation ([place])   . Pneumonia 1992  . Tachycardia-bradycardia ([place])    a. s/p STJ dual chamber pacemaker  . Valvular heart disease 07/01/2016    Past Surgical History:  Procedure Laterality Date  . CHOLECYSTECTOMY N/A 11/24/2015   Procedure: LAPAROSCOPIC CHOLECYSTECTOMY;  Surgeon: Greer Pickerel, MD;  Location: [facility];  Service: General;  Laterality: N/A;  . CYSTOSCOPY  2016  . FRACTURE SURGERY  2005   left ankle  . HERNIA REPAIR     left groin, no mesh  . PERMANENT PACEMAKER INSERTION N/A 01/31/2012   SJM Accent SR RF implanted by DR Allred    Family History  Problem Relation Age of Onset  . Hypertension Mother   . Arthritis Mother   . Drug abuse Mother   . Colon cancer Neg Hx   . Stroke Father   . Heart disease Father   . Heart disease Sister 70  . Heart disease Brother   . Hypertension Son   . Heart disease Sister   . Neuropathy Brother   . Hypertension Brother   . Atrial fibrillation Brother   . Atrial fibrillation Sister     Social History   Social History  . Marital status: Married    Spouse name: [name]  . Number of children: 1  . Years of education: N/A   Occupational History  .  Retired   . Part-time driver for car company    Social History Main Topics  . Smoking status: Former Smoker    Years: 10.00    Types: Pipe, Cigars    Quit date: 11/18/1985  . Smokeless tobacco: Never Used  . Alcohol use 1.8 oz/week    3 Cans of beer per week     Comment: occ  . Drug use: No  . Sexual activity: Yes   Other Topics Concern  . Not on file   Social History Narrative   Lives with wife, still works part-time as a Geophysicist/field seismologist. He is active around the house and yard...   Parents are deceased and did not have cardiac issues but he has 2 siblings with atrial fibrillation and a sister-in-law  has a pacemaker.    Outpatient Medications Prior to Visit  Medication Sig Dispense Refill  . diltiazem (CARDIZEM CD) 240 MG 24 hr capsule Take 1 capsule (240 mg total) by mouth daily. 30 capsule 6  . fluticasone (FLONASE) 50 MCG/ACT nasal spray Place 2 sprays into both nostrils daily. 16 g 6  . irbesartan-hydrochlorothiazide (AVALIDE) 150-12.5 MG per tablet Take 0.5 tablets by mouth daily.     Marland Kitchen loratadine (CLARITIN) 10 MG tablet Take 10 mg by  mouth daily as needed. Reported on 12/31/2015    . metoprolol (LOPRESSOR) 100 MG tablet Take 100 mg by mouth 2 (two) times daily.     . Rivaroxaban (XARELTO) 20 MG TABS Take 1 tablet (20 mg total) by mouth daily. 30 tablet    No facility-administered medications prior to visit.     Allergies  Allergen Reactions  . Sulfa Antibiotics     Unknown childhood reaction  . Cartia Xt [Diltiazem Hcl Er Beads] Rash    Rash from red dye in generic capsule. Can take different brand. Takes diltiazem - his allergy is to Cardizem    Review of Systems  Constitutional: Negative for fever and malaise/fatigue.  HENT: Negative for congestion.   Eyes: Negative for blurred vision.  Respiratory: Negative for shortness of breath.   Cardiovascular: Negative for chest pain, palpitations and leg swelling.  Gastrointestinal: Negative for abdominal pain, blood in stool  and nausea.  Genitourinary: Negative for dysuria and frequency.  Musculoskeletal: Negative for falls.  Skin: Negative for rash.  Neurological: Negative for dizziness, loss of consciousness and headaches.  Endo/Heme/Allergies: Negative for environmental allergies.  Psychiatric/Behavioral: Negative for depression. The patient is not nervous/anxious.        Objective:    Physical Exam  Constitutional: He is oriented to person, place, and time. He appears well-developed and well-nourished. No distress.  HENT:  Head: Normocephalic and atraumatic.  Nose: Nose normal.  Eyes: Right eye exhibits no discharge. Left eye exhibits no discharge.  Neck: Normal range of motion. Neck supple.  Cardiovascular: Normal rate.   Murmur heard. Irregularly irregular  Pulmonary/Chest: Effort normal and breath sounds normal.  Abdominal: Soft. Bowel sounds are normal. There is no tenderness.  Musculoskeletal: He exhibits no edema.  Neurological: He is alert and oriented to person, place, and time.  Skin: Skin is warm and dry.  Psychiatric: He has a normal mood and affect.  Nursing note and vitals reviewed.   BP 128/72 (BP Location: Left Arm, Patient Position: Sitting, Cuff Size: Normal)   Pulse 80   Temp 98.3 F (36.8 C) (Oral)   Ht 6\' 1"  (1.854 m)   Wt 223 lb 6 oz (101.3 kg)   SpO2 97%   BMI 29.47 kg/m  Wt Readings from Last 3 Encounters:  07/01/16 223 lb 6 oz (101.3 kg)  01/08/16 225 lb (102.1 kg)  01/06/16 224 lb 12.8 oz (102 kg)     Lab Results  Component Value Date   WBC 5.8 12/31/2015   HGB 14.7 12/31/2015   HCT 43.9 12/31/2015   PLT 149.0 (L) 12/31/2015   GLUCOSE 108 (H) 12/31/2015   CHOL 155 12/31/2015   TRIG 92.0 12/31/2015   HDL 33.90 (L) 12/31/2015   LDLCALC 102 (H) 12/31/2015   ALT 18 12/31/2015   AST 20 12/31/2015   NA 142 12/31/2015   K 3.6 12/31/2015   CL 108 12/31/2015   CREATININE 1.32 12/31/2015   BUN 27 (H) 12/31/2015   CO2 27 12/31/2015   TSH 1.31 12/31/2015     INR 1.20 11/24/2015    Lab Results  Component Value Date   TSH 1.31 12/31/2015   Lab Results  Component Value Date   WBC 5.8 12/31/2015   HGB 14.7 12/31/2015   HCT 43.9 12/31/2015   MCV 88.4 12/31/2015   PLT 149.0 (L) 12/31/2015   Lab Results  Component Value Date   NA 142 12/31/2015   K 3.6 12/31/2015   CO2 27 12/31/2015   GLUCOSE 108 (  H) 12/31/2015   BUN 27 (H) 12/31/2015   CREATININE 1.32 12/31/2015   BILITOT 0.8 12/31/2015   ALKPHOS 82 12/31/2015   AST 20 12/31/2015   ALT 18 12/31/2015   PROT 7.2 12/31/2015   ALBUMIN 4.0 12/31/2015   CALCIUM 9.1 12/31/2015   ANIONGAP 8 11/19/2015   GFR 55.71 (L) 12/31/2015   Lab Results  Component Value Date   CHOL 155 12/31/2015   Lab Results  Component Value Date   HDL 33.90 (L) 12/31/2015   Lab Results  Component Value Date   LDLCALC 102 (H) 12/31/2015   Lab Results  Component Value Date   TRIG 92.0 12/31/2015   Lab Results  Component Value Date   CHOLHDL 5 12/31/2015   No results found for: HGBA1C     Assessment & Plan:   Problem List Items Addressed This Visit    Hypertension    Well controlled, no changes to meds. Encouraged heart healthy diet such as the DASH diet and exercise as tolerated.       Relevant Medications   rivaroxaban (XARELTO) 20 MG TABS tablet   metoprolol (LOPRESSOR) 100 MG tablet   Other Relevant Orders   TSH   CBC   A-fib (HCC)    Doing well, asymptomatic, tolerating xarelto      Relevant Medications   rivaroxaban (XARELTO) 20 MG TABS tablet   metoprolol (LOPRESSOR) 100 MG tablet   Other Relevant Orders   Ambulatory referral to Cardiology   Calculi, ureter    No recent flares      Osteopenia    Encouraged to get adequate exercise, calcium and vitamin d intake      Carotid artery disease (HCC)    Repeat ultrasound ordered       Relevant Medications   rivaroxaban (XARELTO) 20 MG TABS tablet   metoprolol (LOPRESSOR) 100 MG tablet   Valvular heart disease     Multiple valves involved and check ultrasound. Then refer to [facility] heart care      Relevant Medications   rivaroxaban (XARELTO) 20 MG TABS tablet   metoprolol (LOPRESSOR) 100 MG tablet    Other Visit Diagnoses    Carotid artery dissection (HCC)    -  Primary   Relevant Medications   rivaroxaban (XARELTO) 20 MG TABS tablet   metoprolol (LOPRESSOR) 100 MG tablet   Other Relevant Orders   US Carotid Duplex Bilateral   Ambulatory referral to Cardiology   Mitral valve disorder       Relevant Medications   rivaroxaban (XARELTO) 20 MG TABS tablet   metoprolol (LOPRESSOR) 100 MG tablet   Other Relevant Orders   ECHOCARDIOGRAM COMPLETE   Ambulatory referral to Cardiology   Hyperglycemia       Relevant Orders   Comprehensive metabolic panel   Hemoglobin A1c   Hyperlipidemia, mixed       Relevant Medications   rivaroxaban (XARELTO) 20 MG TABS tablet   metoprolol (LOPRESSOR) 100 MG tablet   Other Relevant Orders   Lipid panel      I have changed Mr. Niblack's rivaroxaban and metoprolol. I am also having him maintain his irbesartan-hydrochlorothiazide, loratadine, fluticasone, and diltiazem.  Meds ordered this encounter  Medications  . rivaroxaban (XARELTO) 20 MG TABS tablet    Sig: Take 1 tablet (20 mg total) by mouth daily.    Dispense:  90 tablet    Refill:  3  . metoprolol (LOPRESSOR) 100 MG tablet    Sig: Take 1 tablet (100 mg total)  by mouth 2 (two) times daily.    Dispense:  180 tablet    Refill:  3     [name], MD

## 2016-07-01 NOTE — Assessment & Plan Note (Signed)
No recent flares 

## 2016-07-01 NOTE — Progress Notes (Signed)
Pre visit review using our clinic review tool, if applicable. No additional management support is needed unless otherwise documented below in the visit note. 

## 2016-07-01 NOTE — Assessment & Plan Note (Signed)
Multiple valves involved and check ultrasound. Then refer to [facility] heart care

## 2016-07-01 NOTE — Assessment & Plan Note (Signed)
Repeat ultrasound ordered.

## 2016-07-01 NOTE — Patient Instructions (Signed)

## 2016-07-01 NOTE — Assessment & Plan Note (Signed)
Well controlled, no changes to meds. Encouraged heart healthy diet such as the DASH diet and exercise as tolerated.  °

## 2016-07-04 LAB — TSH: TSH: 1.12 u[IU]/mL (ref 0.35–4.50)

## 2016-07-05 ENCOUNTER — Telehealth: Payer: Self-pay | Admitting: Family Medicine

## 2016-07-05 MED ORDER — RIVAROXABAN 20 MG PO TABS: 20.0000 mg | ORAL_TABLET | Freq: Every day | ORAL | 3 refills | Status: DC

## 2016-07-05 NOTE — Telephone Encounter (Signed)
Patient is requesting a refill of rivaroxaban (XARELTO) 20 MG TABS tablet He states that the pharmacy sent the prescription to the wrong office and needs this one refaxed. Please advise.   Pharmacy: [facility] [address] [place], [place]

## 2016-07-05 NOTE — Telephone Encounter (Signed)
xarlto informed and sent in.

## 2016-07-11 ENCOUNTER — Ambulatory Visit (INDEPENDENT_AMBULATORY_CARE_PROVIDER_SITE_OTHER): Payer: PPO | Admitting: *Deleted

## 2016-07-11 ENCOUNTER — Ambulatory Visit: Payer: PPO | Admitting: Cardiovascular Disease

## 2016-07-11 DIAGNOSIS — I495 Sick sinus syndrome: Secondary | ICD-10-CM | POA: Diagnosis not present

## 2016-07-12 NOTE — Progress Notes (Signed)
Remote pacemaker transmission.   

## 2016-07-13 ENCOUNTER — Encounter: Payer: Self-pay | Admitting: Cardiology

## 2016-07-20 ENCOUNTER — Ambulatory Visit (HOSPITAL_BASED_OUTPATIENT_CLINIC_OR_DEPARTMENT_OTHER)
Admission: RE | Admit: 2016-07-20 | Discharge: 2016-07-20 | Disposition: A | Discharge status: Home / Self Care | Payer: PPO | Source: Ambulatory Visit | Attending: Family Medicine | Admitting: Family Medicine

## 2016-07-20 DIAGNOSIS — I77810 Thoracic aortic ectasia: Secondary | ICD-10-CM | POA: Insufficient documentation

## 2016-07-20 DIAGNOSIS — I071 Rheumatic tricuspid insufficiency: Secondary | ICD-10-CM | POA: Diagnosis not present

## 2016-07-20 DIAGNOSIS — I7771 Dissection of carotid artery: Secondary | ICD-10-CM

## 2016-07-20 DIAGNOSIS — Z87891 Personal history of nicotine dependence: Secondary | ICD-10-CM | POA: Insufficient documentation

## 2016-07-20 DIAGNOSIS — I340 Nonrheumatic mitral (valve) insufficiency: Secondary | ICD-10-CM | POA: Insufficient documentation

## 2016-07-20 DIAGNOSIS — I059 Rheumatic mitral valve disease, unspecified: Secondary | ICD-10-CM

## 2016-07-20 DIAGNOSIS — I2510 Atherosclerotic heart disease of native coronary artery without angina pectoris: Secondary | ICD-10-CM | POA: Diagnosis not present

## 2016-07-20 DIAGNOSIS — I6523 Occlusion and stenosis of bilateral carotid arteries: Secondary | ICD-10-CM | POA: Insufficient documentation

## 2016-07-20 DIAGNOSIS — I351 Nonrheumatic aortic (valve) insufficiency: Secondary | ICD-10-CM | POA: Insufficient documentation

## 2016-07-20 DIAGNOSIS — I119 Hypertensive heart disease without heart failure: Secondary | ICD-10-CM | POA: Insufficient documentation

## 2016-07-20 NOTE — Progress Notes (Signed)
  Echocardiogram 2D Echocardiogram has been performed.  Brian Davidson 07/20/2016, 9:41 AM

## 2016-07-24 LAB — CUP PACEART REMOTE DEVICE CHECK
Brady Statistic RV Percent Paced: 9.1 %
Date Time Interrogation Session: 20171106080606
Implantable Lead Location: 753860
Lead Channel Impedance Value: 650 Ohm
Lead Channel Sensing Intrinsic Amplitude: 12 mV
Lead Channel Setting Pacing Amplitude: 2.5 V
MDC IDC LEAD IMPLANT DT: 20130528
MDC IDC LEAD MODEL: 1948
MDC IDC MSMT BATTERY REMAINING LONGEVITY: 142 mo
MDC IDC MSMT BATTERY REMAINING PERCENTAGE: 95.5 %
MDC IDC MSMT BATTERY VOLTAGE: 2.98 V
MDC IDC MSMT LEADCHNL RV PACING THRESHOLD AMPLITUDE: 0.5 V
MDC IDC MSMT LEADCHNL RV PACING THRESHOLD PULSEWIDTH: 0.4 ms
MDC IDC PG IMPLANT DT: 20130528
MDC IDC SET LEADCHNL RV PACING PULSEWIDTH: 0.4 ms
MDC IDC SET LEADCHNL RV SENSING SENSITIVITY: 2 mV
Pulse Gen Model: 1210
Pulse Gen Serial Number: 7328888

## 2016-08-17 ENCOUNTER — Ambulatory Visit: Payer: PPO | Admitting: Cardiovascular Disease

## 2016-09-13 ENCOUNTER — Other Ambulatory Visit: Payer: Self-pay

## 2016-09-13 MED ORDER — IRBESARTAN-HYDROCHLOROTHIAZIDE 150-12.5 MG PO TABS: 0.5000 | ORAL_TABLET | Freq: Every day | ORAL | 0 refills | Status: DC

## 2016-09-18 ENCOUNTER — Encounter: Payer: Self-pay | Admitting: Family Medicine

## 2016-09-19 ENCOUNTER — Ambulatory Visit (HOSPITAL_BASED_OUTPATIENT_CLINIC_OR_DEPARTMENT_OTHER)
Admission: RE | Admit: 2016-09-19 | Discharge: 2016-09-19 | Disposition: A | Discharge status: Home / Self Care | Payer: PPO | Source: Ambulatory Visit | Attending: Medical | Admitting: Medical

## 2016-09-19 ENCOUNTER — Ambulatory Visit (INDEPENDENT_AMBULATORY_CARE_PROVIDER_SITE_OTHER): Payer: PPO | Admitting: Medical

## 2016-09-19 ENCOUNTER — Encounter: Payer: Self-pay | Admitting: Medical

## 2016-09-19 VITALS — BP 130/61 | HR 75 | Temp 97.8°F | Resp 16 | Ht 72.0 in | Wt 218.2 lb

## 2016-09-19 DIAGNOSIS — R059 Cough, unspecified: Secondary | ICD-10-CM

## 2016-09-19 DIAGNOSIS — R05 Cough: Secondary | ICD-10-CM

## 2016-09-19 DIAGNOSIS — J209 Acute bronchitis, unspecified: Secondary | ICD-10-CM

## 2016-09-19 DIAGNOSIS — I517 Cardiomegaly: Secondary | ICD-10-CM | POA: Insufficient documentation

## 2016-09-19 MED ORDER — ALBUTEROL SULFATE HFA 108 (90 BASE) MCG/ACT IN AERS: 2.0000 | INHALATION_SPRAY | Freq: Four times a day (QID) | RESPIRATORY_TRACT | 0 refills | Status: DC | PRN

## 2016-09-19 MED ORDER — DOXYCYCLINE HYCLATE 100 MG PO TABS: 100.0000 mg | ORAL_TABLET | Freq: Two times a day (BID) | ORAL | 0 refills | Status: DC

## 2016-09-19 MED ORDER — BENZONATATE 100 MG PO CAPS: 100.0000 mg | ORAL_CAPSULE | Freq: Three times a day (TID) | ORAL | 0 refills | Status: DC | PRN

## 2016-09-19 NOTE — Progress Notes (Signed)
Pre visit review using our clinic review tool, if applicable. No additional management support is needed unless otherwise documented below in the visit note/SLS  

## 2016-09-19 NOTE — Progress Notes (Signed)
Subjective:    Patient ID: Brian Davidson, male    DOB: 25-Jan-1936, 81 y.o.   MRN: [id number]  HPI  Pt in today with head congestion, nasal congestion, non productive cough, chills and some sweats. Some fatigue as well. Symptoms for one week.  Pt states he feels like mucous in chest feels like about to break up and become productive.  Pt states some sweats last night.  No diffuse myalgias.      Review of Systems  Constitutional: Positive for chills, fatigue and fever.  HENT: Positive for congestion, rhinorrhea, sinus pain and sinus pressure.   Respiratory: Positive for cough and wheezing. Negative for chest tightness and shortness of breath.        Mild wheezing on occasion.  Cardiovascular: Negative for chest pain and palpitations.  Gastrointestinal: Negative for abdominal pain.  Endocrine: Negative for polydipsia, polyphagia and polyuria.  Genitourinary: Negative for flank pain and hematuria.  Musculoskeletal: Negative for back pain, myalgias and neck pain.  Skin: Negative for rash.  Neurological: Negative for dizziness and headaches.  Hematological: Negative for adenopathy. Does not bruise/bleed easily.  Psychiatric/Behavioral: Negative for agitation, behavioral problems, decreased concentration and dysphoric mood.    Past Medical History:  Diagnosis Date  . Arthritis   . Biliary dyskinesia   . Carotid artery disease ([place]) 07/01/2016  . GERD (gastroesophageal reflux disease)   . H/O measles   . History of chicken pox   . Hypertension   . Kidney stone 03/2015  . Persistent atrial fibrillation ([place])   . Pneumonia 1992  . Tachycardia-bradycardia ([place])    a. s/p STJ dual chamber pacemaker  . Valvular heart disease 07/01/2016     Social History   Social History  . Marital status: Married    Spouse name: [name]  . Number of children: 1  . Years of education: N/A   Occupational History  . Retired   . Part-time driver for car company    Social History Main  Topics  . Smoking status: Former Smoker    Years: 10.00    Types: Pipe, Cigars    Quit date: 11/18/1985  . Smokeless tobacco: Never Used  . Alcohol use 1.8 oz/week    3 Cans of beer per week     Comment: occ  . Drug use: No  . Sexual activity: Yes   Other Topics Concern  . Not on file   Social History Narrative   Lives with wife, still works part-time as a Geophysicist/field seismologist. He is active around the house and yard...   Parents are deceased and did not have cardiac issues but he has 2 siblings with atrial fibrillation and a sister-in-law  has a pacemaker.    Past Surgical History:  Procedure Laterality Date  . CHOLECYSTECTOMY N/A 11/24/2015   Procedure: LAPAROSCOPIC CHOLECYSTECTOMY;  Surgeon: Greer Pickerel, MD;  Location: [facility];  Service: General;  Laterality: N/A;  . CYSTOSCOPY  2016  . FRACTURE SURGERY  2005   left ankle  . HERNIA REPAIR     left groin, no mesh  . PERMANENT PACEMAKER INSERTION N/A 01/31/2012   SJM Accent SR RF implanted by DR Allred    Family History  Problem Relation Age of Onset  . Hypertension Mother   . Arthritis Mother   . Drug abuse Mother   . Colon cancer Neg Hx   . Stroke Father   . Heart disease Father   . Heart disease Sister 76  . Heart disease Brother   .  Hypertension Son   . Heart disease Sister   . Neuropathy Brother   . Hypertension Brother   . Atrial fibrillation Brother   . Atrial fibrillation Sister     Allergies  Allergen Reactions  . Sulfa Antibiotics     Unknown childhood reaction  . Cartia Xt [Diltiazem Hcl Er Beads] Rash    Rash from red dye in generic capsule. Can take different brand. Takes diltiazem - his allergy is to Cardizem    Current Outpatient Prescriptions on File Prior to Visit  Medication Sig Dispense Refill  . diltiazem (CARDIZEM CD) 240 MG 24 hr capsule Take 1 capsule (240 mg total) by mouth daily. 30 capsule 6  . fluticasone (FLONASE) 50 MCG/ACT nasal spray Place 2 sprays into both nostrils daily. (Patient taking  differently: Place 2 sprays into both nostrils daily as needed. ) 16 g 6  . irbesartan-hydrochlorothiazide (AVALIDE) 150-12.5 MG tablet Take 0.5 tablets by mouth daily. 90 tablet 0  . loratadine (CLARITIN) 10 MG tablet Take 10 mg by mouth daily as needed. Reported on 12/31/2015    . metoprolol (LOPRESSOR) 100 MG tablet Take 1 tablet (100 mg total) by mouth 2 (two) times daily. 180 tablet 3  . rivaroxaban (XARELTO) 20 MG TABS tablet Take 1 tablet (20 mg total) by mouth daily. 90 tablet 3   No current facility-administered medications on file prior to visit.     BP 130/61 (BP Location: Right Arm, Patient Position: Sitting, Cuff Size: Large)   Pulse 75   Temp 97.8 F (36.6 C) (Oral)   Resp 16   Ht 6' (1.829 m)   Wt 218 lb 4 oz (99 kg)   SpO2 98%   BMI 29.60 kg/m       Objective:   Physical Exam  General  Mental Status - Alert. General Appearance - Well groomed. Not in acute distress.  Skin Rashes- No Rashes.  HEENT Head- Normal. Ear Auditory Canal - Left- Normal. Right - Normal.Tympanic Membrane- Left- Normal. Right- Normal. Eye Sclera/Conjunctiva- Left- Normal. Right- Normal. Nose & Sinuses Nasal Mucosa- Left-  Boggy and Congested. Right-  Boggy and  Congested.Bilateral  No maxillary and  No  frontal sinus pressure. Mouth & Throat Lips: Upper Lip- Normal: no dryness, cracking, pallor, cyanosis, or vesicular eruption. Lower Lip-Normal: no dryness, cracking, pallor, cyanosis or vesicular eruption. Buccal Mucosa- Bilateral- No Aphthous ulcers. Oropharynx- No Discharge or Erythema. Tonsils: Characteristics- Bilateral- No Erythema or Congestion. Size/Enlargement- Bilateral- No enlargement. Discharge- bilateral-None.  Neck Neck- Supple. No Masses.   Chest and Lung Exam Auscultation: Breath Sounds:-even and unlabored. But scattered mild rhonchi. No wheezing heard.  Cardiovascular Auscultation:Rythm- Regular, rate and rhythm. Murmurs & Other Heart Sounds:Ausculatation of  the heart reveal- No Murmurs.  Lymphatic Head & Neck General Head & Neck Lymphatics: Bilateral: Description- No Localized lymphadenopathy.       Assessment & Plan:  You appear to have bronchitis. Rest hydrate and tylenol for fever. I am prescribing cough medicine benzonatate, and doxycycline antibiotic.   I do want you to get cxr today and cbc today.  For wheezing rx albuterol  Follow up in 7-10 days or as needed

## 2016-09-19 NOTE — Patient Instructions (Addendum)
You appear to have bronchitis. Rest hydrate and tylenol for fever. I am prescribing cough medicine benzonatate, and doxycycline antibiotic.   I do want you to get cxr today and cbc today.  For wheezing rx albuterol    Follow up in 7-10 days or as needed

## 2016-09-20 ENCOUNTER — Telehealth: Payer: Self-pay | Admitting: Family Medicine

## 2016-09-20 ENCOUNTER — Ambulatory Visit: Payer: Self-pay | Admitting: Medical

## 2016-09-20 ENCOUNTER — Other Ambulatory Visit (INDEPENDENT_AMBULATORY_CARE_PROVIDER_SITE_OTHER): Payer: PPO

## 2016-09-20 DIAGNOSIS — R05 Cough: Secondary | ICD-10-CM

## 2016-09-20 DIAGNOSIS — J209 Acute bronchitis, unspecified: Secondary | ICD-10-CM

## 2016-09-20 LAB — CBC WITH DIFFERENTIAL/PLATELET
Basophils Absolute: 0 10*3/uL (ref 0.0–0.1)
Basophils Relative: 0.4 % (ref 0.0–3.0)
EOS ABS: 0.3 10*3/uL (ref 0.0–0.7)
EOS PCT: 4 % (ref 0.0–5.0)
HEMATOCRIT: 46.6 % (ref 39.0–52.0)
HEMOGLOBIN: 15.7 g/dL (ref 13.0–17.0)
LYMPHS PCT: 24.5 % (ref 12.0–46.0)
Lymphs Abs: 1.6 10*3/uL (ref 0.7–4.0)
MCHC: 33.7 g/dL (ref 30.0–36.0)
MCV: 88.9 fl (ref 78.0–100.0)
MONO ABS: 0.9 10*3/uL (ref 0.1–1.0)
Monocytes Relative: 12.7 % — ABNORMAL HIGH (ref 3.0–12.0)
Neutro Abs: 3.9 10*3/uL (ref 1.4–7.7)
Neutrophils Relative %: 58.4 % (ref 43.0–77.0)
Platelets: 139 10*3/uL — ABNORMAL LOW (ref 150.0–400.0)
RBC: 5.25 Mil/uL (ref 4.22–5.81)
RDW: 14 % (ref 11.5–15.5)
WBC: 6.7 10*3/uL (ref 4.0–10.5)

## 2016-09-20 MED ORDER — AZITHROMYCIN 250 MG PO TABS: ORAL_TABLET | ORAL | 0 refills | Status: DC

## 2016-09-20 NOTE — Telephone Encounter (Signed)
°  Relation to pt: SELF Call back number: [phone] Pharmacy:[facility]  Reason for call: pt saw Percell Miller on yesterday, states his pharmacy informed him that they did not fill the rx that Percell Miller prescribed because pt is allergic to something in the medication. Pt would like to know what other options

## 2016-09-20 NOTE — Telephone Encounter (Signed)
Copy & Pasted in to Open note RE: this matter, forwarded to provider/SLS 01/16 Shiquita C Johnson routed conversation to You 1 hour ago (11:12 AM)    Nani Ravens 1 hour ago (11:10 AM)    [place] [phone]  Called in to discuss changing pt's Rx for doxycycline She says that pt is allergic to yellow die. She suggested doxycycline delayed relief.   Please assist further.  Documentation

## 2016-09-20 NOTE — Telephone Encounter (Signed)
Copy & Pasted in to Open note RE: this matter, forwarded to provider/SLS 01/16

## 2016-09-20 NOTE — Telephone Encounter (Signed)
[place] [phone]    Called in to discuss changing pt's Rx for doxycycline She says that pt is allergic to yellow die. She suggested doxycycline delayed relief.   Please assist further.

## 2016-09-20 NOTE — Telephone Encounter (Signed)
Talked with pt wife today as she was seen in office. She informed me. So rx'd azithromycin and dc'd doxy.

## 2016-10-10 ENCOUNTER — Telehealth: Payer: Self-pay | Admitting: Internal Medicine

## 2016-10-10 ENCOUNTER — Ambulatory Visit (INDEPENDENT_AMBULATORY_CARE_PROVIDER_SITE_OTHER): Payer: PPO | Admitting: *Deleted

## 2016-10-10 DIAGNOSIS — I495 Sick sinus syndrome: Secondary | ICD-10-CM | POA: Diagnosis not present

## 2016-10-10 NOTE — Telephone Encounter (Signed)
Spoke w/ pt and helped him send remote transmission. Transmission received.

## 2016-10-10 NOTE — Telephone Encounter (Signed)
New Message  Pt call requesting to speak with RN to get assistance with sending transmission. Please call back to assist.

## 2016-10-11 LAB — CUP PACEART REMOTE DEVICE CHECK
Brady Statistic RV Percent Paced: 8.9 %
Date Time Interrogation Session: 20180205142731
Implantable Lead Location: 753860
Implantable Pulse Generator Implant Date: 20130528
Lead Channel Pacing Threshold Amplitude: 0.5 V
Lead Channel Pacing Threshold Pulse Width: 0.4 ms
Lead Channel Sensing Intrinsic Amplitude: 12 mV
Lead Channel Setting Pacing Amplitude: 2.5 V
MDC IDC LEAD IMPLANT DT: 20130528
MDC IDC MSMT BATTERY REMAINING LONGEVITY: 142 mo
MDC IDC MSMT BATTERY REMAINING PERCENTAGE: 95.5 %
MDC IDC MSMT BATTERY VOLTAGE: 2.98 V
MDC IDC MSMT LEADCHNL RV IMPEDANCE VALUE: 650 Ohm
MDC IDC SET LEADCHNL RV PACING PULSEWIDTH: 0.4 ms
MDC IDC SET LEADCHNL RV SENSING SENSITIVITY: 2 mV
Pulse Gen Serial Number: 7328888

## 2016-10-11 NOTE — Progress Notes (Signed)
Remote pacemaker transmission.   

## 2016-10-12 ENCOUNTER — Encounter: Payer: Self-pay | Admitting: Cardiology

## 2016-12-16 ENCOUNTER — Encounter: Payer: Self-pay | Admitting: Internal Medicine

## 2016-12-19 ENCOUNTER — Other Ambulatory Visit: Payer: Self-pay | Admitting: Family Medicine

## 2016-12-19 MED ORDER — DILTIAZEM HCL ER COATED BEADS 240 MG PO CP24: 240.0000 mg | ORAL_CAPSULE | Freq: Every day | ORAL | 6 refills | Status: DC

## 2016-12-27 ENCOUNTER — Encounter: Payer: PPO | Admitting: Family Medicine

## 2016-12-29 ENCOUNTER — Encounter: Payer: Self-pay | Admitting: Family Medicine

## 2016-12-29 NOTE — Progress Notes (Signed)
Received fax from [facility], Shingrix immunization administered on 12/26/16, 50 mcg IM/LD; documented in Immunization historical/SLS 04/26

## 2017-01-09 ENCOUNTER — Encounter: Payer: PPO | Admitting: Internal Medicine

## 2017-01-09 NOTE — Progress Notes (Signed)
Subjective:   Brian Davidson is a 81 y.o. male who presents for an Initial Medicare Annual Wellness Visit.  Review of Systems  No ROS.  Medicare Wellness Visit. Cardiac Risk Factors include: advanced age (>58men, >61 women);hypertension Sleep patterns: Sleeps 6-9 hrs per night. Feels rested. Home Safety/Smoke Alarms:  Feels safe in home. Smoke alarms in place.  Living environment; residence and Firearm Safety: Lives with wife.  Seat Belt Safety/Bike Helmet: Wears seat belt.   Counseling:   Eye Exam- Dr.Digby--had eye exam this morning. Dental-Dentures upper and lower.   Male:   CCS-  Pt declines   PSA- No results found for: PSA     Objective:    Today's Vitals   01/10/17 1531  BP: (!) 160/82  Pulse: 74  SpO2: 98%  Weight: 218 lb (98.9 kg)  Height: 6' (1.829 m)   Body mass index is 29.57 kg/m.  Current Medications (verified) Outpatient Encounter Prescriptions as of 01/10/2017  Medication Sig  . diltiazem (CARDIZEM CD) 240 MG 24 hr capsule Take 1 capsule (240 mg total) by mouth daily.  . irbesartan-hydrochlorothiazide (AVALIDE) 150-12.5 MG tablet Take 0.5 tablets by mouth daily.  . metoprolol (LOPRESSOR) 100 MG tablet Take 1 tablet (100 mg total) by mouth 2 (two) times daily.  . rivaroxaban (XARELTO) 20 MG TABS tablet Take 1 tablet (20 mg total) by mouth daily.  Marland Kitchen SHINGRIX injection   . albuterol (PROVENTIL HFA;VENTOLIN HFA) 108 (90 Base) MCG/ACT inhaler Inhale 2 puffs into the lungs every 6 (six) hours as needed for wheezing or shortness of breath. (Patient not taking: Reported on 01/10/2017)  . fluticasone (FLONASE) 50 MCG/ACT nasal spray Place 2 sprays into both nostrils daily. (Patient not taking: Reported on 01/10/2017)  . loratadine (CLARITIN) 10 MG tablet Take 10 mg by mouth daily as needed. Reported on 12/31/2015  . [DISCONTINUED] azithromycin (ZITHROMAX) 250 MG tablet Take 2 tablets by mouth on day 1, followed by 1 tablet by mouth daily for 4 days.  .  [DISCONTINUED] benzonatate (TESSALON) 100 MG capsule Take 1 capsule (100 mg total) by mouth 3 (three) times daily as needed for cough.   No facility-administered encounter medications on file as of 01/10/2017.     Allergies (verified) Sulfa antibiotics and Cartia xt [diltiazem hcl er beads]   History: Past Medical History:  Diagnosis Date  . Arthritis   . Biliary dyskinesia   . Carotid artery disease ([place]) 07/01/2016  . GERD (gastroesophageal reflux disease)   . H/O measles   . History of chicken pox   . Hypertension   . Kidney stone 03/2015  . Persistent atrial fibrillation ([place])   . Pneumonia 1992  . Tachycardia-bradycardia ([place])    a. s/p STJ dual chamber pacemaker  . Valvular heart disease 07/01/2016   Past Surgical History:  Procedure Laterality Date  . CHOLECYSTECTOMY N/A 11/24/2015   Procedure: LAPAROSCOPIC CHOLECYSTECTOMY;  Surgeon: Greer Pickerel, MD;  Location: [facility];  Service: General;  Laterality: N/A;  . CYSTOSCOPY  2016  . FRACTURE SURGERY  2005   left ankle  . HERNIA REPAIR     left groin, no mesh  . PERMANENT PACEMAKER INSERTION N/A 01/31/2012   SJM Accent SR RF implanted by DR Allred   Family History  Problem Relation Age of Onset  . Hypertension Mother   . Arthritis Mother   . Drug abuse Mother   . Stroke Father   . Heart disease Father   . Heart disease Sister 19  . Heart  disease Brother   . Hypertension Son   . Heart disease Sister   . Neuropathy Brother   . Hypertension Brother   . Atrial fibrillation Brother   . Atrial fibrillation Sister   . Colon cancer Neg Hx    Social History   Occupational History  . Retired   . Part-time driver for car company    Social History Main Topics  . Smoking status: Former Smoker    Years: 10.00    Types: Pipe, Cigars    Quit date: 11/18/1985  . Smokeless tobacco: Never Used  . Alcohol use 1.8 oz/week    3 Cans of beer per week     Comment: occ  . Drug use: No  . Sexual activity: No   Tobacco  Counseling Counseling given: No   Activities of Daily Living In your present state of health, do you have any difficulty performing the following activities: 01/10/2017  Hearing? Y  Vision? N  Difficulty concentrating or making decisions? Y  Walking or climbing stairs? N  Dressing or bathing? N  Doing errands, shopping? N  Preparing Food and eating ? N  Using the Toilet? N  In the past six months, have you accidently leaked urine? N  Do you have problems with loss of bowel control? N  Managing your Medications? N  Managing your Finances? N  Housekeeping or managing your Housekeeping? N  Some recent data might be hidden    Immunizations and Health Maintenance Immunization History  Administered Date(s) Administered  . Influenza-Unspecified 06/11/2016  . Pneumococcal Conjugate-13 07/20/2015  . Pneumococcal Polysaccharide-23 09/05/2016  . Tdap 11/14/2014  . Zoster Recombinat (Shingrix) 12/26/2016   There are no preventive care reminders to display for this patient.  Patient Care Team: Mosie Lukes, MD as PCP - General (Family Medicine) Thompson Grayer, MD as Consulting Physician (Cardiology) Franchot Mimes, MD as Consulting Physician (Family Medicine) Armbruster, Renelda Loma, MD as Consulting Physician (Gastroenterology) Thompson Grayer, MD as Consulting Physician (Cardiology)  Indicate any recent Medical Services you may have received from other than [facility] providers in the past year (date may be approximate).    Assessment:   This is a routine wellness examination for Brian Davidson. Physical assessment deferred to PCP.  Hearing/Vision screen No exam data present  Dietary issues and exercise activities discussed: Exercise limited by: None identified Diet (meal preparation, eat out, water intake, caffeinated beverages, dairy products, fruits and vegetables): in general, a "healthy" diet       Goals    . Maintain current active lifestyle      Depression Screen PHQ 2/9  Scores 01/10/2017 09/28/2015  PHQ - 2 Score 0 0    Fall Risk Fall Risk  01/10/2017 09/28/2015  Falls in the past year? No No    Cognitive Function: MMSE - Mini Mental State Exam 01/10/2017  Orientation to time 5  Orientation to Place 5  Registration 3  Attention/ Calculation 5  Recall 2  Language- name 2 objects 2  Language- repeat 1  Language- follow 3 step command 3  Language- read & follow direction 1  Write a sentence 1  Copy design 1  Total score 29        Screening Tests Health Maintenance  Topic Date Due  . INFLUENZA VACCINE  04/05/2017  . TETANUS/TDAP  11/13/2024  . PNA vac Low Risk Adult  Completed        Plan:   Follow up with PCP today as scheduled.  Continue to eat  heart healthy diet (full of fruits, vegetables, whole grains, lean protein, water--limit salt, fat, and sugar intake) and increase physical activity as tolerated.  Continue doing brain stimulating activities (puzzles, reading, adult coloring books, staying active) to keep memory sharp.    I have personally reviewed and noted the following in the patient's chart:   . Medical and social history . Use of alcohol, tobacco or illicit drugs  . Current medications and supplements . Functional ability and status . Nutritional status . Physical activity . Advanced directives . List of other physicians . Hospitalizations, surgeries, and ER visits in previous 12 months . Vitals . Screenings to include cognitive, depression, and falls . Referrals and appointments  In addition, I have reviewed and discussed with patient certain preventive protocols, quality metrics, and best practice recommendations. A written personalized care plan for preventive services as well as general preventive health recommendations were provided to patient.     Shela Nevin, [place]   01/10/2017    Medical screening examination was performed by Health Coach and as supervising physician I was immediately available for  consultation/collaboration. I have reviewed documentation and agree with assessment and plan.  [name], MD

## 2017-01-10 ENCOUNTER — Ambulatory Visit (INDEPENDENT_AMBULATORY_CARE_PROVIDER_SITE_OTHER): Payer: PPO | Admitting: Family Medicine

## 2017-01-10 ENCOUNTER — Encounter: Payer: Self-pay | Admitting: Family Medicine

## 2017-01-10 VITALS — BP 144/84 | HR 74 | Ht 72.0 in | Wt 218.0 lb

## 2017-01-10 DIAGNOSIS — I10 Essential (primary) hypertension: Secondary | ICD-10-CM

## 2017-01-10 DIAGNOSIS — Z0000 Encounter for general adult medical examination without abnormal findings: Secondary | ICD-10-CM

## 2017-01-10 DIAGNOSIS — E785 Hyperlipidemia, unspecified: Secondary | ICD-10-CM

## 2017-01-10 DIAGNOSIS — H26493 Other secondary cataract, bilateral: Secondary | ICD-10-CM | POA: Diagnosis not present

## 2017-01-10 DIAGNOSIS — I4891 Unspecified atrial fibrillation: Secondary | ICD-10-CM

## 2017-01-10 DIAGNOSIS — H04123 Dry eye syndrome of bilateral lacrimal glands: Secondary | ICD-10-CM | POA: Diagnosis not present

## 2017-01-10 DIAGNOSIS — M8580 Other specified disorders of bone density and structure, unspecified site: Secondary | ICD-10-CM

## 2017-01-10 DIAGNOSIS — H524 Presbyopia: Secondary | ICD-10-CM | POA: Diagnosis not present

## 2017-01-10 NOTE — Patient Instructions (Addendum)
Check blood pressure weekly x 4 weeks and let us know numbers. Rest quiet for 5 to 10 minutes prior to taking   Brian Davidson , Thank you for taking time to come for your Medicare Wellness Visit. I appreciate your ongoing commitment to your health goals. Please review the following plan we discussed and let me know if I can assist you in the future.   These are the goals we discussed: Goals    . Maintain current active lifestyle       This is a list of the screening recommended for you and due dates:  Health Maintenance  Topic Date Due  . Flu Shot  04/05/2017  . Tetanus Vaccine  11/13/2024  . Pneumonia vaccines  Completed    Health Maintenance, Male A healthy lifestyle and preventive care is important for your health and wellness. Ask your health care provider about what schedule of regular examinations is right for you. What should I know about weight and diet?  Eat a Healthy Diet  Eat plenty of vegetables, fruits, whole grains, low-fat dairy products, and lean protein.  Do not eat a lot of foods high in solid fats, added sugars, or salt. Maintain a Healthy Weight  Regular exercise can help you achieve or maintain a healthy weight. You should:  Do at least 150 minutes of exercise each week. The exercise should increase your heart rate and make you sweat (moderate-intensity exercise).  Do strength-training exercises at least twice a week. Watch Your Levels of Cholesterol and Blood Lipids  Have your blood tested for lipids and cholesterol every 5 years starting at 79 years of age. If you are at high risk for heart disease, you should start having your blood tested when you are 79 years old. You may need to have your cholesterol levels checked more often if:  Your lipid or cholesterol levels are high.  You are older than 79 years of age.  You are at high risk for heart disease. What should I know about cancer screening? Many types of cancers can be detected early and may often  be prevented. Lung Cancer  You should be screened every year for lung cancer if:  You are a current smoker who has smoked for at least 30 years.  You are a former smoker who has quit within the past 15 years.  Talk to your health care provider about your screening options, when you should start screening, and how often you should be screened. Colorectal Cancer  Routine colorectal cancer screening usually begins at 79 years of age and should be repeated every 5-10 years until you are 79 years old. You may need to be screened more often if early forms of precancerous polyps or small growths are found. Your health care provider may recommend screening at an earlier age if you have risk factors for colon cancer.  Your health care provider may recommend using home test kits to check for hidden blood in the stool.  A small camera at the end of a tube can be used to examine your colon (sigmoidoscopy or colonoscopy). This checks for the earliest forms of colorectal cancer. Prostate and Testicular Cancer  Depending on your age and overall health, your health care provider may do certain tests to screen for prostate and testicular cancer.  Talk to your health care provider about any symptoms or concerns you have about testicular or prostate cancer. Skin Cancer  Check your skin from head to toe regularly.  Tell your health  care provider about any new moles or changes in moles, especially if:  There is a change in a mole's size, shape, or color.  You have a mole that is larger than a pencil eraser.  Always use sunscreen. Apply sunscreen liberally and repeat throughout the day.  Protect yourself by wearing long sleeves, pants, a wide-brimmed hat, and sunglasses when outside. What should I know about heart disease, diabetes, and high blood pressure?  If you are 30-54 years of age, have your blood pressure checked every 3-5 years. If you are 69 years of age or older, have your blood pressure  checked every year. You should have your blood pressure measured twice-once when you are at a hospital or clinic, and once when you are not at a hospital or clinic. Record the average of the two measurements. To check your blood pressure when you are not at a hospital or clinic, you can use:  An automated blood pressure machine at a pharmacy.  A home blood pressure monitor.  Talk to your health care provider about your target blood pressure.  If you are between 46-41 years old, ask your health care provider if you should take aspirin to prevent heart disease.  Have regular diabetes screenings by checking your fasting blood sugar level.  If you are at a normal weight and have a low risk for diabetes, have this test once every three years after the age of 51.  If you are overweight and have a high risk for diabetes, consider being tested at a younger age or more often.  A one-time screening for abdominal aortic aneurysm (AAA) by ultrasound is recommended for men aged 46-75 years who are current or former smokers. What should I know about preventing infection? Hepatitis B  If you have a higher risk for hepatitis B, you should be screened for this virus. Talk with your health care provider to find out if you are at risk for hepatitis B infection. Hepatitis C  Blood testing is recommended for:  Everyone born from [birth year] through 1965.  Anyone with known risk factors for hepatitis C. Sexually Transmitted Diseases (STDs)  You should be screened each year for STDs including gonorrhea and chlamydia if:  You are sexually active and are younger than 79 years of age.  You are older than 79 years of age and your health care provider tells you that you are at risk for this type of infection.  Your sexual activity has changed since you were last screened and you are at an increased risk for chlamydia or gonorrhea. Ask your health care provider if you are at risk.  Talk with your health care  provider about whether you are at high risk of being infected with HIV. Your health care provider may recommend a prescription medicine to help prevent HIV infection. What else can I do?  Schedule regular health, dental, and eye exams.  Stay current with your vaccines (immunizations).  Do not use any tobacco products, such as cigarettes, chewing tobacco, and e-cigarettes. If you need help quitting, ask your health care provider.  Limit alcohol intake to no more than 2 drinks per day. One drink equals 12 ounces of beer, 5 ounces of wine, or 1 ounces of hard liquor.  Do not use street drugs.  Do not share needles.  Ask your health care provider for help if you need support or information about quitting drugs.  Tell your health care provider if you often feel depressed.  Tell your health  care provider if you have ever been abused or do not feel safe at home. This information is not intended to replace advice given to you by your health care provider. Make sure you discuss any questions you have with your health care provider. Document Released: 02/18/2008 Document Revised: 04/20/2016 Document Reviewed: 05/26/2015 Elsevier Interactive Patient Education  2017 [employer].

## 2017-01-10 NOTE — Progress Notes (Signed)
Pre visit review using our clinic review tool, if applicable. No additional management support is needed unless otherwise documented below in the visit note. 

## 2017-01-10 NOTE — Progress Notes (Addendum)
Patient ID: ANANT AGARD, male   DOB: 1936-08-03, 81 y.o.   MRN: [id number]   Subjective:  I acted as a Education administrator for [name], [place], [place]   Patient ID: [name], male    DOB: May 27, 1936, 81 y.o.   MRN: [id number]  Chief Complaint  Patient presents with  . Medicare Wellness    wtih RN    HPI  Patient is in today for a medicare Wellness Visit with the Health Coach, to be followed by a 33-month follow up with the Provider. Patient has a Hx of HTN, A-fib, osteopenia, back pain, valvular heart disease. Patient has no acute concerns noted at this time. He is doing well with ADLs at home. No recent febrile illness or hospitalizations. Denies CP/palp/SOB/HA/congestion/fevers/GI or GU c/o. Taking meds as prescribed  Patient Care Team: Mosie Lukes, MD as PCP - General (Family Medicine) Thompson Grayer, MD as Consulting Physician (Cardiology) Franchot Mimes, MD as Consulting Physician (Family Medicine) Armbruster, Renelda Loma, MD as Consulting Physician (Gastroenterology) Thompson Grayer, MD as Consulting Physician (Cardiology)   Past Medical History:  Diagnosis Date  . Arthritis   . Biliary dyskinesia   . Carotid artery disease ([place]) 07/01/2016  . GERD (gastroesophageal reflux disease)   . H/O measles   . History of chicken pox   . Hypertension   . Kidney stone 03/2015  . Persistent atrial fibrillation ([place])   . Pneumonia 1992  . Tachycardia-bradycardia ([place])    a. s/p STJ dual chamber pacemaker  . Valvular heart disease 07/01/2016    Past Surgical History:  Procedure Laterality Date  . CHOLECYSTECTOMY N/A 11/24/2015   Procedure: LAPAROSCOPIC CHOLECYSTECTOMY;  Surgeon: Greer Pickerel, MD;  Location: [facility];  Service: General;  Laterality: N/A;  . CYSTOSCOPY  2016  . FRACTURE SURGERY  2005   left ankle  . HERNIA REPAIR     left groin, no mesh  . PERMANENT PACEMAKER INSERTION N/A 01/31/2012   SJM Accent SR RF implanted by DR Allred    Family History  Problem  Relation Age of Onset  . Hypertension Mother   . Arthritis Mother   . Drug abuse Mother   . Stroke Father   . Heart disease Father   . Heart disease Sister 29  . Heart disease Brother   . Hypertension Son   . Heart disease Sister   . Neuropathy Brother   . Hypertension Brother   . Atrial fibrillation Brother   . Atrial fibrillation Sister   . Colon cancer Neg Hx     Social History   Social History  . Marital status: Married    Spouse name: [name]  . Number of children: 1  . Years of education: N/A   Occupational History  . Retired   . Part-time driver for car company    Social History Main Topics  . Smoking status: Former Smoker    Years: 10.00    Types: Pipe, Cigars    Quit date: 11/18/1985  . Smokeless tobacco: Never Used  . Alcohol use 1.8 oz/week    3 Cans of beer per week     Comment: occ  . Drug use: No  . Sexual activity: No   Other Topics Concern  . Not on file   Social History Narrative   Lives with wife, still works part-time as a Geophysicist/field seismologist. He is active around the house and yard...   Parents are deceased and did not have cardiac issues but he has 2  siblings with atrial fibrillation and a sister-in-law  has a pacemaker.    Outpatient Medications Prior to Visit  Medication Sig Dispense Refill  . diltiazem (CARDIZEM CD) 240 MG 24 hr capsule Take 1 capsule (240 mg total) by mouth daily. 30 capsule 6  . irbesartan-hydrochlorothiazide (AVALIDE) 150-12.5 MG tablet Take 0.5 tablets by mouth daily. 90 tablet 0  . metoprolol (LOPRESSOR) 100 MG tablet Take 1 tablet (100 mg total) by mouth 2 (two) times daily. 180 tablet 3  . rivaroxaban (XARELTO) 20 MG TABS tablet Take 1 tablet (20 mg total) by mouth daily. 90 tablet 3  . albuterol (PROVENTIL HFA;VENTOLIN HFA) 108 (90 Base) MCG/ACT inhaler Inhale 2 puffs into the lungs every 6 (six) hours as needed for wheezing or shortness of breath. (Patient not taking: Reported on 01/10/2017) 1 Inhaler 0  . fluticasone (FLONASE) 50  MCG/ACT nasal spray Place 2 sprays into both nostrils daily. (Patient not taking: Reported on 01/10/2017) 16 g 6  . loratadine (CLARITIN) 10 MG tablet Take 10 mg by mouth daily as needed. Reported on 12/31/2015    . azithromycin (ZITHROMAX) 250 MG tablet Take 2 tablets by mouth on day 1, followed by 1 tablet by mouth daily for 4 days. 6 tablet 0  . benzonatate (TESSALON) 100 MG capsule Take 1 capsule (100 mg total) by mouth 3 (three) times daily as needed for cough. 21 capsule 0   No facility-administered medications prior to visit.     Allergies  Allergen Reactions  . Sulfa Antibiotics     Unknown childhood reaction  . Cartia Xt [Diltiazem Hcl Er Beads] Rash    Rash from red dye in generic capsule. Can take different brand. Takes diltiazem - his allergy is to Cardizem    Review of Systems  Constitutional: Negative for fever and malaise/fatigue.  HENT: Negative for congestion.   Eyes: Negative for blurred vision.  Respiratory: Negative for cough and shortness of breath.   Cardiovascular: Negative for chest pain, palpitations and leg swelling.  Gastrointestinal: Negative for vomiting.  Musculoskeletal: Negative for back pain.  Skin: Negative for rash.  Neurological: Negative for loss of consciousness and headaches.       Objective:    Physical Exam  Constitutional: He is oriented to person, place, and time. He appears well-developed and well-nourished. No distress.  HENT:  Head: Normocephalic and atraumatic.  Eyes: Conjunctivae are normal.  Neck: Normal range of motion. No thyromegaly present.  Cardiovascular: Normal rate and regular rhythm.   Pulmonary/Chest: Effort normal and breath sounds normal. He has no wheezes.  Abdominal: Soft. Bowel sounds are normal. There is no tenderness.  Musculoskeletal: He exhibits no edema or deformity.  Neurological: He is alert and oriented to person, place, and time.  Skin: Skin is warm and dry. He is not diaphoretic.  Psychiatric: He has  a normal mood and affect.    BP (!) 144/84   Pulse 74   Ht 6' (1.829 m)   Wt 218 lb (98.9 kg)   SpO2 98%   BMI 29.57 kg/m  Wt Readings from Last 3 Encounters:  01/10/17 218 lb (98.9 kg)  09/19/16 218 lb 4 oz (99 kg)  07/01/16 223 lb 6 oz (101.3 kg)   BP Readings from Last 3 Encounters:  01/10/17 (!) 144/84  09/19/16 130/61  07/01/16 128/72     Immunization History  Administered Date(s) Administered  . Influenza-Unspecified 06/11/2016  . Pneumococcal Conjugate-13 07/20/2015  . Pneumococcal Polysaccharide-23 09/05/2016  . Tdap 11/14/2014  .  Zoster Recombinat (Shingrix) 12/26/2016    Health Maintenance  Topic Date Due  . INFLUENZA VACCINE  04/05/2017  . TETANUS/TDAP  11/13/2024  . PNA vac Low Risk Adult  Completed    Lab Results  Component Value Date   WBC 6.7 09/20/2016   HGB 15.7 09/20/2016   HCT 46.6 09/20/2016   PLT 139.0 (L) 09/20/2016   GLUCOSE 94 07/01/2016   CHOL 163 07/01/2016   TRIG 113.0 07/01/2016   HDL 37.80 (L) 07/01/2016   LDLCALC 102 (H) 07/01/2016   ALT 14 07/01/2016   AST 19 07/01/2016   NA 140 07/01/2016   K 3.9 07/01/2016   CL 106 07/01/2016   CREATININE 1.30 07/01/2016   BUN 29 (H) 07/01/2016   CO2 26 07/01/2016   TSH 1.12 07/01/2016   INR 1.20 11/24/2015   HGBA1C 5.7 07/01/2016    Lab Results  Component Value Date   TSH 1.12 07/01/2016   Lab Results  Component Value Date   WBC 6.7 09/20/2016   HGB 15.7 09/20/2016   HCT 46.6 09/20/2016   MCV 88.9 09/20/2016   PLT 139.0 (L) 09/20/2016   Lab Results  Component Value Date   NA 140 07/01/2016   K 3.9 07/01/2016   CO2 26 07/01/2016   GLUCOSE 94 07/01/2016   BUN 29 (H) 07/01/2016   CREATININE 1.30 07/01/2016   BILITOT 1.0 07/01/2016   ALKPHOS 66 07/01/2016   AST 19 07/01/2016   ALT 14 07/01/2016   PROT 7.2 07/01/2016   ALBUMIN 4.0 07/01/2016   CALCIUM 9.4 07/01/2016   ANIONGAP 8 11/19/2015   GFR 56.63 (L) 07/01/2016   Lab Results  Component Value Date   CHOL 163  07/01/2016   Lab Results  Component Value Date   HDL 37.80 (L) 07/01/2016   Lab Results  Component Value Date   LDLCALC 102 (H) 07/01/2016   Lab Results  Component Value Date   TRIG 113.0 07/01/2016   Lab Results  Component Value Date   CHOLHDL 4 07/01/2016   Lab Results  Component Value Date   HGBA1C 5.7 07/01/2016         Assessment & Plan:   Problem List Items Addressed This Visit    Hypertension    Well controlled, no changes to meds. Encouraged heart healthy diet such as the DASH diet and exercise as tolerated.       Relevant Orders   CBC   Comprehensive metabolic panel   TSH   A-fib (HCC)    Tolerating Xarelto and rate controlled      Osteopenia    Encouraged to get adequate exercise, calcium and vitamin d intake      Medicare annual wellness visit, subsequent - Primary    Other Visit Diagnoses    Encounter for Medicare annual wellness exam       Hyperlipidemia, unspecified hyperlipidemia type       Relevant Orders   Lipid panel      I have discontinued Mr. Jachim's benzonatate and azithromycin. I am also having him maintain his loratadine, fluticasone, metoprolol tartrate, rivaroxaban, irbesartan-hydrochlorothiazide, albuterol, diltiazem, and SHINGRIX.  Meds ordered this encounter  Medications  . SHINGRIX injection    CMA served as Education administrator during this visit. History, Physical and Plan performed by medical provider. Documentation and orders reviewed and attested to.  [name], MD

## 2017-01-10 NOTE — Assessment & Plan Note (Signed)
Well controlled, no changes to meds. Encouraged heart healthy diet such as the DASH diet and exercise as tolerated.  °

## 2017-01-11 DIAGNOSIS — Z0000 Encounter for general adult medical examination without abnormal findings: Secondary | ICD-10-CM | POA: Insufficient documentation

## 2017-01-11 NOTE — Assessment & Plan Note (Signed)
Tolerating Xarelto and rate controlled 

## 2017-01-11 NOTE — Assessment & Plan Note (Signed)
Encouraged to get adequate exercise, calcium and vitamin d intake 

## 2017-01-11 NOTE — Assessment & Plan Note (Deleted)
Patient denies any difficulties at home. No trouble with ADLs, depression or falls. See EMR for functional status screen and depression screen. No recent changes to vision or hearing. Is UTD with immunizations. Is UTD with screening. Discussed Advanced Directives. Encouraged heart healthy diet, exercise as tolerated and adequate sleep. See patient's problem list for health risk factors to monitor. See AVS for preventative healthcare recommendation schedule. 

## 2017-02-03 DEATH — deceased

## 2017-03-06 ENCOUNTER — Encounter: Payer: Self-pay | Admitting: Internal Medicine

## 2017-03-06 ENCOUNTER — Ambulatory Visit (INDEPENDENT_AMBULATORY_CARE_PROVIDER_SITE_OTHER): Payer: PPO | Admitting: Internal Medicine

## 2017-03-06 VITALS — BP 138/80 | HR 69 | Ht 72.0 in | Wt 219.4 lb

## 2017-03-06 DIAGNOSIS — I482 Chronic atrial fibrillation: Secondary | ICD-10-CM | POA: Diagnosis not present

## 2017-03-06 DIAGNOSIS — I4821 Permanent atrial fibrillation: Secondary | ICD-10-CM

## 2017-03-06 DIAGNOSIS — I779 Disorder of arteries and arterioles, unspecified: Secondary | ICD-10-CM

## 2017-03-06 DIAGNOSIS — I739 Peripheral vascular disease, unspecified: Secondary | ICD-10-CM

## 2017-03-06 DIAGNOSIS — I495 Sick sinus syndrome: Secondary | ICD-10-CM | POA: Diagnosis not present

## 2017-03-06 NOTE — Progress Notes (Signed)
PCP: Mosie Lukes, MD Primary Cardiologist:  Dr Claudie Leach previously (unable to return to due insurance concerns).   Brian Davidson is a 81 y.o. male who presents today for routine electrophysiology followup.  Since last being seen in our clinic, the patient reports doing very well.  He remains active.  He has some fatigue on hot days.  Today, he denies symptoms of palpitations, chest pain, shortness of breath,  lower extremity edema, dizziness, presyncope, or syncope.  The patient is otherwise without complaint today.   Past Medical History:  Diagnosis Date  . Arthritis   . Biliary dyskinesia   . Carotid artery disease ([place]) 07/01/2016  . GERD (gastroesophageal reflux disease)   . H/O measles   . History of chicken pox   . Hypertension   . Kidney stone 03/2015  . Persistent atrial fibrillation ([place])   . Pneumonia 1992  . Tachycardia-bradycardia ([place])    a. s/p STJ dual chamber pacemaker  . Valvular heart disease 07/01/2016   Past Surgical History:  Procedure Laterality Date  . CHOLECYSTECTOMY N/A 11/24/2015   Procedure: LAPAROSCOPIC CHOLECYSTECTOMY;  Surgeon: Greer Pickerel, MD;  Location: [facility];  Service: General;  Laterality: N/A;  . CYSTOSCOPY  2016  . FRACTURE SURGERY  2005   left ankle  . HERNIA REPAIR     left groin, no mesh  . PERMANENT PACEMAKER INSERTION N/A 01/31/2012   SJM Accent SR RF implanted by DR Brandice Busser    ROS- all systems are reviewed and negative except as per HPI above  Current Outpatient Prescriptions  Medication Sig Dispense Refill  . diltiazem (CARDIZEM CD) 240 MG 24 hr capsule Take 1 capsule (240 mg total) by mouth daily. 30 capsule 6  . irbesartan-hydrochlorothiazide (AVALIDE) 150-12.5 MG tablet Take 0.5 tablets by mouth daily. 90 tablet 0  . loratadine (CLARITIN) 10 MG tablet Take 10 mg by mouth daily as needed. Reported on 12/31/2015    . metoprolol (LOPRESSOR) 100 MG tablet Take 1 tablet (100 mg total) by mouth 2 (two) times daily. 180 tablet 3  .  rivaroxaban (XARELTO) 20 MG TABS tablet Take 1 tablet (20 mg total) by mouth daily. 90 tablet 3  . SHINGRIX injection as directed.      No current facility-administered medications for this visit.     Physical Exam: Vitals:   03/06/17 1214  BP: 138/80  Pulse: 69  SpO2: 97%  Weight: 219 lb 6.4 oz (99.5 kg)  Height: 6' (1.829 m)    GEN- The patient is well appearing, alert and oriented x 3 today.   Head- normocephalic, atraumatic Eyes-  Sclera clear, conjunctiva pink Ears- hearing intact Oropharynx- clear Lungs- Clear to ausculation bilaterally, normal work of breathing Chest- pacemaker pocket is well healed Heart- irregular rate and rhythm  GI- soft, NT, ND, + BS Extremities- no clubbing, cyanosis, or edema  Pacemaker interrogation- reviewed in detail today,  See PACEART report  Assessment and Plan:  1. symptomatic Bradycardia Normal pacemaker function See Pace Art report No changes today  2. Permanent afib Anticoagulated for chads2vasc score of 4 Rate controlled  3. HTN Stable No change required today  4. Valvular heart disease Followed by Dr Claudie Leach previously.  Unfortunately, due to insurance, he is unable to follow with Dr Claudie Leach currently. Will follow with echo annually Moderate AI, mild MR  5. CAS Repeat carotid dopplers in a year  Merlin Return to see me in 1 year unless problems arise in the interim.  Thompson Grayer MD, [facility]  03/06/2017 12:31 PM

## 2017-03-06 NOTE — Patient Instructions (Addendum)
Medication Instructions:    Your physician recommends that you continue on your current medications as directed. Please refer to the Current Medication list given to you today.  - If you need a refill on your cardiac medications before your next appointment, please call your pharmacy.   Labwork:  None ordered  Testing/Procedures: Your physician has requested that you have an echocardiogram in one year (before follow up with Dr Rayann Heman). Echocardiography is a painless test that uses sound waves to create images of your heart. It provides your doctor with information about the size and shape of your heart and how well your heart's chambers and valves are working. This procedure takes approximately one hour. There are no restrictions for this procedure.  Your physician has requested that you have a carotid duplex in one year (before follow up with Dr. Rayann Heman). This test is an ultrasound of the carotid arteries in your neck. It looks at blood flow through these arteries that supply the brain with blood. Allow one hour for this exam. There are no restrictions or special instructions.  Follow-Up:  Your physician wants you to follow-up in: 1 year with Dr. Rayann Heman.  You will receive a reminder letter in the mail two months in advance. If you don't receive a letter, please call our office to schedule the follow-up appointment.  Thank you for choosing [facility]!!     Any Other Special Instructions Will Be Listed Below (If Applicable).

## 2017-03-22 ENCOUNTER — Other Ambulatory Visit: Payer: Self-pay | Admitting: Family Medicine

## 2017-04-07 ENCOUNTER — Encounter: Payer: Self-pay | Admitting: Medical

## 2017-04-07 ENCOUNTER — Ambulatory Visit (INDEPENDENT_AMBULATORY_CARE_PROVIDER_SITE_OTHER): Payer: PPO | Admitting: Medical

## 2017-04-07 VITALS — BP 112/65 | HR 81 | Temp 98.4°F | Resp 16 | Ht 72.0 in | Wt 219.8 lb

## 2017-04-07 DIAGNOSIS — L089 Local infection of the skin and subcutaneous tissue, unspecified: Secondary | ICD-10-CM

## 2017-04-07 DIAGNOSIS — B029 Zoster without complications: Secondary | ICD-10-CM

## 2017-04-07 MED ORDER — CEPHALEXIN 500 MG PO CAPS: 500.0000 mg | ORAL_CAPSULE | Freq: Two times a day (BID) | ORAL | 0 refills | Status: DC

## 2017-04-07 MED ORDER — FAMCICLOVIR 500 MG PO TABS: 500.0000 mg | ORAL_TABLET | Freq: Three times a day (TID) | ORAL | 0 refills | Status: DC

## 2017-04-07 NOTE — Patient Instructions (Addendum)
Concern for early shingles with secondary skin infection.  Will rx keflex antibiotic for skin infection and famvir antiviral area.   If area worsens over weekend then ED evaluation.  Follow up Wednesday or as needed.  For mild itching upper lid can use claritin.   Discussion with pt on shingles vaccine and not 100% effective. In light of upcoming weekend and location  want him to start famvir.  Note pt not sure if got insect bite. Speculate he did.

## 2017-04-07 NOTE — Progress Notes (Signed)
Subjective:    Patient ID: Brian Davidson, male    DOB: 1936/02/09, 81 y.o.   MRN: [id number]  HPI  Pt in states wed morning slight small bumps  under left eyeand faint swelling to area when he woke up. He feels 3 small bumps grouped together. One looks like skin scraped off. No pain but funny feeling. Faint swelling.  No left eye pain or visual disturbance.  No known insect bite. He only speculated could have been the case.       Review of Systems  Constitutional: Negative for chills and fatigue.  Respiratory: Negative for cough, chest tightness, shortness of breath and wheezing.   Cardiovascular: Negative for chest pain and palpitations.  Gastrointestinal: Negative for abdominal pain.  Genitourinary: Negative for dysuria, flank pain and frequency.  Musculoskeletal: Negative for back pain.  Skin:       See hpi.  Neurological: Negative for dizziness, light-headedness and headaches.  Hematological: Negative for adenopathy. Does not bruise/bleed easily.  Psychiatric/Behavioral: Negative for behavioral problems.    Past Medical History:  Diagnosis Date  . Arthritis   . Biliary dyskinesia   . Carotid artery disease ([place]) 07/01/2016  . GERD (gastroesophageal reflux disease)   . H/O measles   . History of chicken pox   . Hypertension   . Kidney stone 03/2015  . Persistent atrial fibrillation ([place])   . Pneumonia 1992  . Tachycardia-bradycardia ([place])    a. s/p STJ dual chamber pacemaker  . Valvular heart disease 07/01/2016     Social History   Social History  . Marital status: Married    Spouse name: [name]  . Number of children: 1  . Years of education: N/A   Occupational History  . Retired   . Part-time driver for car company    Social History Main Topics  . Smoking status: Former Smoker    Years: 10.00    Types: Pipe, Cigars    Quit date: 11/18/1985  . Smokeless tobacco: Never Used  . Alcohol use 1.8 oz/week    3 Cans of beer per week     Comment: occ    . Drug use: No  . Sexual activity: No   Other Topics Concern  . Not on file   Social History Narrative   Lives with wife, still works part-time as a Geophysicist/field seismologist. He is active around the house and yard...   Parents are deceased and did not have cardiac issues but he has 2 siblings with atrial fibrillation and a sister-in-law  has a pacemaker.    Past Surgical History:  Procedure Laterality Date  . CHOLECYSTECTOMY N/A 11/24/2015   Procedure: LAPAROSCOPIC CHOLECYSTECTOMY;  Surgeon: Greer Pickerel, MD;  Location: [facility];  Service: General;  Laterality: N/A;  . CYSTOSCOPY  2016  . FRACTURE SURGERY  2005   left ankle  . HERNIA REPAIR     left groin, no mesh  . PERMANENT PACEMAKER INSERTION N/A 01/31/2012   SJM Accent SR RF implanted by DR Allred    Family History  Problem Relation Age of Onset  . Hypertension Mother   . Arthritis Mother   . Drug abuse Mother   . Stroke Father   . Heart disease Father   . Heart disease Sister 32  . Heart disease Brother   . Hypertension Son   . Heart disease Sister   . Neuropathy Brother   . Hypertension Brother   . Atrial fibrillation Brother   . Atrial fibrillation Sister   .  Colon cancer Neg Hx     Allergies  Allergen Reactions  . Cardizem [Diltiazem]     Rash from red dye in generic capsule. Can take different brand. Takes diltiazem - his allergy is to Cardizem  . Sulfa Antibiotics     Unknown childhood reaction    Current Outpatient Prescriptions on File Prior to Visit  Medication Sig Dispense Refill  . diltiazem (CARDIZEM CD) 240 MG 24 hr capsule Take 1 capsule (240 mg total) by mouth daily. 30 capsule 6  . irbesartan-hydrochlorothiazide (AVALIDE) 150-12.5 MG tablet TAKE ONE-HALF TABLET BY MOUTH ONCE DAILY 90 tablet 0  . loratadine (CLARITIN) 10 MG tablet Take 10 mg by mouth daily as needed. Reported on 12/31/2015    . metoprolol (LOPRESSOR) 100 MG tablet Take 1 tablet (100 mg total) by mouth 2 (two) times daily. 180 tablet 3  .  rivaroxaban (XARELTO) 20 MG TABS tablet Take 1 tablet (20 mg total) by mouth daily. 90 tablet 3  . SHINGRIX injection as directed.      No current facility-administered medications on file prior to visit.     BP 112/65   Pulse 81   Temp 98.4 F (36.9 C) (Oral)   Resp 16   Ht 6' (1.829 m)   Wt 219 lb 12.8 oz (99.7 kg)   SpO2 99%   BMI 29.81 kg/m       Objective:   Physical Exam   General  Mental Status - Alert. General Appearance - Well groomed. Not in acute distress.  Skin Under left eye 3 small papular type lesion. One small area of broken down down skin(size of vesicle) faint puffiness to region. No vesicles seen   HEENT Head- Normal. Ear Auditory Canal - Left- Normal. Right - Normal.Tympanic Membrane- Left- Normal. Right- Normal. Eye Sclera/Conjunctiva- Left- Normal. Right- Normal. Nose & Sinuses Nasal Mucosa- Left-   Not Boggy and Congested. Right-  Not Boggy and  Congested.Bilateral  No maxillary and no  frontal sinus pressure.   Neck Neck- Supple. No Masses.   Chest and Lung Exam Auscultation: Breath Sounds:-Clear even and unlabored.  Cardiovascular Auscultation:Rythm- Regular, rate and rhythm. Murmurs & Other Heart Sounds:Ausculatation of the heart reveal- No Murmurs.  Lymphatic Head & Neck General Head & Neck Lymphatics: Bilateral: Description- No Localized lymphadenopathy.      Assessment & Plan:  Concern for early shingles with secondary skin infection.  Will rx keflex antibiotic for skin infection and famvir antiviral area.   If area worsens over weekend then ED evaluation.  Follow up Wednesday or as needed.  For mild itching upper lid can use claritin.  Discussion with pt on shingles vaccine and not 100% effective. In light of upcoming weekend and location  want him to start famvir.  Note pt not sure if got insect bite. Speculate he did.   Denis Carreon, Percell Miller, PA-C

## 2017-04-11 DIAGNOSIS — H26493 Other secondary cataract, bilateral: Secondary | ICD-10-CM | POA: Diagnosis not present

## 2017-04-11 DIAGNOSIS — H04123 Dry eye syndrome of bilateral lacrimal glands: Secondary | ICD-10-CM | POA: Diagnosis not present

## 2017-04-12 ENCOUNTER — Encounter: Payer: Self-pay | Admitting: Medical

## 2017-04-12 ENCOUNTER — Ambulatory Visit (INDEPENDENT_AMBULATORY_CARE_PROVIDER_SITE_OTHER): Payer: PPO | Admitting: Medical

## 2017-04-12 VITALS — BP 118/62 | HR 66 | Temp 97.9°F | Resp 16 | Ht 72.0 in | Wt 220.8 lb

## 2017-04-12 DIAGNOSIS — L089 Local infection of the skin and subcutaneous tissue, unspecified: Secondary | ICD-10-CM

## 2017-04-12 NOTE — Progress Notes (Signed)
Subjective:    Patient ID: Brian Davidson, male    DOB: 05-30-1936, 81 y.o.   MRN: [id number]  HPI   Pt in for follow up to.  He states within 2 days the area near his eye cleared up. Pt has no persisting rash. Pt did well with keflex and for caution sake put on famvir based on local of the rash./lesion.  Pt has no current symptoms.  Pt has been on meds for 5 days.  Pt saw his optometrist just yesterday for routine check up. No abnormlity  Found yesterday   Review of Systems  Constitutional: Negative for chills, fatigue and fever.  Respiratory: Negative for cough, chest tightness and shortness of breath.   Cardiovascular: Negative for chest pain and palpitations.  Gastrointestinal: Negative for abdominal pain.  Skin:       Cleared.  Neurological: Negative for dizziness and light-headedness.  Hematological: Negative for adenopathy.  Psychiatric/Behavioral: Negative for behavioral problems and confusion.   Past Medical History:  Diagnosis Date  . Arthritis   . Biliary dyskinesia   . Carotid artery disease ([place]) 07/01/2016  . GERD (gastroesophageal reflux disease)   . H/O measles   . History of chicken pox   . Hypertension   . Kidney stone 03/2015  . Persistent atrial fibrillation ([place])   . Pneumonia 1992  . Tachycardia-bradycardia ([place])    a. s/p STJ dual chamber pacemaker  . Valvular heart disease 07/01/2016     Social History   Social History  . Marital status: Married    Spouse name: [name]  . Number of children: 1  . Years of education: N/A   Occupational History  . Retired   . Part-time driver for car company    Social History Main Topics  . Smoking status: Former Smoker    Years: 10.00    Types: Pipe, Cigars    Quit date: 11/18/1985  . Smokeless tobacco: Never Used  . Alcohol use 1.8 oz/week    3 Cans of beer per week     Comment: occ  . Drug use: No  . Sexual activity: No   Other Topics Concern  . Not on file   Social History Narrative   Lives with wife, still works part-time as a Geophysicist/field seismologist. He is active around the house and yard...   Parents are deceased and did not have cardiac issues but he has 2 siblings with atrial fibrillation and a sister-in-law  has a pacemaker.    Past Surgical History:  Procedure Laterality Date  . CHOLECYSTECTOMY N/A 11/24/2015   Procedure: LAPAROSCOPIC CHOLECYSTECTOMY;  Surgeon: Greer Pickerel, MD;  Location: [facility];  Service: General;  Laterality: N/A;  . CYSTOSCOPY  2016  . FRACTURE SURGERY  2005   left ankle  . HERNIA REPAIR     left groin, no mesh  . PERMANENT PACEMAKER INSERTION N/A 01/31/2012   SJM Accent SR RF implanted by DR Allred    Family History  Problem Relation Age of Onset  . Hypertension Mother   . Arthritis Mother   . Drug abuse Mother   . Stroke Father   . Heart disease Father   . Heart disease Sister 59  . Heart disease Brother   . Hypertension Son   . Heart disease Sister   . Neuropathy Brother   . Hypertension Brother   . Atrial fibrillation Brother   . Atrial fibrillation Sister   . Colon cancer Neg Hx     Allergies  Allergen Reactions  .  Cardizem [Diltiazem]     Rash from Yellow dye in generic capsule. Can take different brand. Takes diltiazem - his allergy is to Cardizem  . Sulfa Antibiotics     Unknown childhood reaction    Current Outpatient Prescriptions on File Prior to Visit  Medication Sig Dispense Refill  . cephALEXin (KEFLEX) 500 MG capsule Take 1 capsule (500 mg total) by mouth 2 (two) times daily. 20 capsule 0  . diltiazem (CARDIZEM CD) 240 MG 24 hr capsule Take 1 capsule (240 mg total) by mouth daily. 30 capsule 6  . famciclovir (FAMVIR) 500 MG tablet Take 1 tablet (500 mg total) by mouth 3 (three) times daily. 21 tablet 0  . irbesartan-hydrochlorothiazide (AVALIDE) 150-12.5 MG tablet TAKE ONE-HALF TABLET BY MOUTH ONCE DAILY 90 tablet 0  . loratadine (CLARITIN) 10 MG tablet Take 10 mg by mouth daily as needed. Reported on 12/31/2015    .  metoprolol (LOPRESSOR) 100 MG tablet Take 1 tablet (100 mg total) by mouth 2 (two) times daily. 180 tablet 3  . rivaroxaban (XARELTO) 20 MG TABS tablet Take 1 tablet (20 mg total) by mouth daily. 90 tablet 3  . SHINGRIX injection as directed.      No current facility-administered medications on file prior to visit.     BP 118/62   Pulse 66   Temp 97.9 F (36.6 C) (Oral)   Resp 16   Ht 6' (1.829 m)   Wt 220 lb 12.8 oz (100.2 kg)   SpO2 98%   BMI 29.95 kg/m       Objective:   Physical Exam  General  Mental Status - Alert. General Appearance - Well groomed. Not in acute distress.  Skin No lesion under eye. The area cleared up completely.    Chest and Lung Exam Auscultation: Breath Sounds:-Clear even and unlabored.  Cardiovascular Auscultation:Rythm- Regular, rate and rhythm. Murmurs & Other Heart Sounds:Ausculatation of the heart reveal- No Murmurs.  Lymphatic Head & Neck General Head & Neck Lymphatics: Bilateral: Description- No Localized lymphadenopathy.        Your skin lesion/infected area looks healed now. Can just use 2 more days of keflex and then stop. I do think you can go ahead and stop famvir since you are better and had negative eye exam just yesterday.  Follow up as needed presently/as regularly scheduled with pcp.

## 2017-04-12 NOTE — Patient Instructions (Signed)
Your skin lesion/infected area looks healed now. Can just use 2 more days of keflex and then stop. I do think you can go ahead and stop famvir since you are better and had negative eye exam just yesterday.  Follow up as needed presently/as regularly scheduled with pcp.

## 2017-06-05 ENCOUNTER — Ambulatory Visit (INDEPENDENT_AMBULATORY_CARE_PROVIDER_SITE_OTHER): Payer: PPO | Admitting: *Deleted

## 2017-06-05 DIAGNOSIS — I495 Sick sinus syndrome: Secondary | ICD-10-CM

## 2017-06-06 NOTE — Progress Notes (Signed)
Remote pacemaker transmission.   

## 2017-06-07 ENCOUNTER — Encounter: Payer: Self-pay | Admitting: Cardiology

## 2017-06-08 LAB — CUP PACEART REMOTE DEVICE CHECK
Battery Voltage: 2.96 V
Brady Statistic RV Percent Paced: 9.3 %
Date Time Interrogation Session: 20181001060505
Implantable Lead Location: 753860
Implantable Pulse Generator Implant Date: 20130528
Lead Channel Impedance Value: 640 Ohm
Lead Channel Pacing Threshold Pulse Width: 0.4 ms
Lead Channel Sensing Intrinsic Amplitude: 12 mV
MDC IDC LEAD IMPLANT DT: 20130528
MDC IDC MSMT BATTERY REMAINING LONGEVITY: 136 mo
MDC IDC MSMT BATTERY REMAINING PERCENTAGE: 95.5 %
MDC IDC MSMT LEADCHNL RV PACING THRESHOLD AMPLITUDE: 0.5 V
MDC IDC SET LEADCHNL RV PACING AMPLITUDE: 2.5 V
MDC IDC SET LEADCHNL RV PACING PULSEWIDTH: 0.4 ms
MDC IDC SET LEADCHNL RV SENSING SENSITIVITY: 2 mV
Pulse Gen Model: 1210
Pulse Gen Serial Number: 7328888

## 2017-06-15 ENCOUNTER — Other Ambulatory Visit: Payer: Self-pay | Admitting: Family Medicine

## 2017-07-06 ENCOUNTER — Other Ambulatory Visit (INDEPENDENT_AMBULATORY_CARE_PROVIDER_SITE_OTHER): Payer: PPO

## 2017-07-06 DIAGNOSIS — I10 Essential (primary) hypertension: Secondary | ICD-10-CM

## 2017-07-06 DIAGNOSIS — E785 Hyperlipidemia, unspecified: Secondary | ICD-10-CM

## 2017-07-06 LAB — COMPREHENSIVE METABOLIC PANEL
ALBUMIN: 3.9 g/dL (ref 3.5–5.2)
ALT: 15 U/L (ref 0–53)
AST: 18 U/L (ref 0–37)
Alkaline Phosphatase: 68 U/L (ref 39–117)
BUN: 21 mg/dL (ref 6–23)
CALCIUM: 9.1 mg/dL (ref 8.4–10.5)
CHLORIDE: 105 meq/L (ref 96–112)
CO2: 27 meq/L (ref 19–32)
Creatinine, Ser: 1.24 mg/dL (ref 0.40–1.50)
GFR: 59.65 mL/min — AB (ref >60.00)
Glucose, Bld: 95 mg/dL (ref 70–99)
POTASSIUM: 3.7 meq/L (ref 3.5–5.1)
Sodium: 139 mEq/L (ref 135–145)
Total Bilirubin: 1 mg/dL (ref 0.2–1.2)
Total Protein: 7 g/dL (ref 6.0–8.3)

## 2017-07-06 LAB — LIPID PANEL
CHOL/HDL RATIO: 5
CHOLESTEROL: 158 mg/dL (ref 0–200)
HDL: 34 mg/dL — AB (ref >39.00)
LDL CALC: 98 mg/dL (ref 0–99)
NonHDL: 123.85
TRIGLYCERIDES: 130 mg/dL (ref 0.0–149.0)
VLDL: 26 mg/dL (ref 0.0–40.0)

## 2017-07-06 LAB — CBC
HEMATOCRIT: 46 % (ref 39.0–52.0)
Hemoglobin: 15.4 g/dL (ref 13.0–17.0)
MCHC: 33.4 g/dL (ref 30.0–36.0)
MCV: 91.9 fl (ref 78.0–100.0)
PLATELETS: 153 10*3/uL (ref 150.0–400.0)
RBC: 5 Mil/uL (ref 4.22–5.81)
RDW: 13.9 % (ref 11.5–15.5)
WBC: 7 10*3/uL (ref 4.0–10.5)

## 2017-07-06 LAB — TSH: TSH: 1.98 u[IU]/mL (ref 0.35–4.50)

## 2017-07-13 ENCOUNTER — Encounter: Payer: Self-pay | Admitting: Family Medicine

## 2017-07-13 ENCOUNTER — Ambulatory Visit: Payer: PPO | Admitting: Family Medicine

## 2017-07-13 DIAGNOSIS — I4821 Permanent atrial fibrillation: Secondary | ICD-10-CM

## 2017-07-13 DIAGNOSIS — G6289 Other specified polyneuropathies: Secondary | ICD-10-CM

## 2017-07-13 DIAGNOSIS — M8580 Other specified disorders of bone density and structure, unspecified site: Secondary | ICD-10-CM | POA: Diagnosis not present

## 2017-07-13 DIAGNOSIS — I10 Essential (primary) hypertension: Secondary | ICD-10-CM

## 2017-07-13 DIAGNOSIS — I495 Sick sinus syndrome: Secondary | ICD-10-CM | POA: Diagnosis not present

## 2017-07-13 DIAGNOSIS — G629 Polyneuropathy, unspecified: Secondary | ICD-10-CM | POA: Insufficient documentation

## 2017-07-13 DIAGNOSIS — I4891 Unspecified atrial fibrillation: Secondary | ICD-10-CM

## 2017-07-13 DIAGNOSIS — E785 Hyperlipidemia, unspecified: Secondary | ICD-10-CM

## 2017-07-13 DIAGNOSIS — I482 Chronic atrial fibrillation: Secondary | ICD-10-CM | POA: Diagnosis not present

## 2017-07-13 HISTORY — DX: Hyperlipidemia, unspecified: E78.5

## 2017-07-13 LAB — VITAMIN B12: Vitamin B-12: 460 pg/mL (ref 211–911)

## 2017-07-13 LAB — FOLATE: Folate: 23.8 ng/mL (ref >5.9)

## 2017-07-13 NOTE — Assessment & Plan Note (Addendum)
No pain just some numbness. Strong family history of neuropathy. Check Vitamin Bs and Zinc today. He declines meds since symptoms are not severe

## 2017-07-13 NOTE — Progress Notes (Signed)
Subjective:  I acted as a Education administrator for Dr. Charlett Blake. [place], [place]  Patient ID: Brian Davidson, male    DOB: [birth year], 81 y.o.   MRN: [id number]  No chief complaint on file.   HPI  Patient is in today for a 6 month follow up and he is feeling well. No recent febrile illness or hospitalizations. He notes he has had mild numbness in his feet for years and it has worsened slightly recently. He has a strong family history of peripheral neuropathy. It is not limiting his daily activities and is more of an annoyance than painful. Denies CP/palp/SOB/HA/congestion/fevers/GI or GU c/o. Taking meds as prescribed  Patient Care Team: Mosie Lukes, MD as PCP - General (Family Medicine) Thompson Grayer, MD as Consulting Physician (Cardiology) Franchot Mimes, MD as Consulting Physician (Family Medicine) Armbruster, Carlota Raspberry, MD as Consulting Physician (Gastroenterology) Thompson Grayer, MD as Consulting Physician (Cardiology)   Past Medical History:  Diagnosis Date  . Arthritis   . Biliary dyskinesia   . Carotid artery disease ([place]) 07/01/2016  . Dyslipidemia 07/13/2017  . GERD (gastroesophageal reflux disease)   . H/O measles   . History of chicken pox   . Hypertension   . Kidney stone 03/2015  . Persistent atrial fibrillation ([place])   . Pneumonia 1992  . Tachycardia-bradycardia ([place])    a. s/p STJ dual chamber pacemaker  . Valvular heart disease 07/01/2016    Past Surgical History:  Procedure Laterality Date  . CYSTOSCOPY  2016  . FRACTURE SURGERY  2005   left ankle  . HERNIA REPAIR     left groin, no mesh    Family History  Problem Relation Age of Onset  . Hypertension Mother   . Arthritis Mother   . Drug abuse Mother   . Stroke Father   . Heart disease Father   . Heart disease Sister 56  . Heart disease Brother   . Hypertension Son   . Heart disease Sister   . Neuropathy Brother   . Hypertension Brother   . Atrial fibrillation Brother   . Atrial fibrillation Sister     . Colon cancer Neg Hx     Social History   Socioeconomic History  . Marital status: Married    Spouse name: [name]  . Number of children: 1  . Years of education: Not on file  . Highest education level: Not on file  Social Needs  . Financial resource strain: Not on file  . Food insecurity - worry: Not on file  . Food insecurity - inability: Not on file  . Transportation needs - medical: Not on file  . Transportation needs - non-medical: Not on file  Occupational History  . Occupation: Retired  . 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 Smoking status: Former Smoker    Years: 10.00    Types: Pipe, Cigars    Last attempt to quit: 11/18/1985    Years since quitting: 31.6  . Smokeless tobacco: Never Used  Substance and Sexual Activity  . Alcohol use: Yes    Alcohol/week: 1.8 oz    Types: 3 Cans of beer per week    Comment: occ  . Drug use: No  . Sexual activity: No  Other Topics Concern  . Not on file  Social History Narrative   Lives with wife, still works part-time as a Geophysicist/field seismologist. He is active around the house and yard...   Parents are deceased and did not have  cardiac issues but he has 2 siblings with atrial fibrillation and a sister-in-law  has a pacemaker.    Outpatient Medications Prior to Visit  Medication Sig Dispense Refill  . diltiazem (CARDIZEM CD) 240 MG 24 hr capsule Take 1 capsule (240 mg total) by mouth daily. 30 capsule 6  . famciclovir (FAMVIR) 500 MG tablet Take 1 tablet (500 mg total) by mouth 3 (three) times daily. 21 tablet 0  . irbesartan-hydrochlorothiazide (AVALIDE) 150-12.5 MG tablet TAKE ONE-HALF TABLET BY MOUTH ONCE DAILY 90 tablet 0  . loratadine (CLARITIN) 10 MG tablet Take 10 mg by mouth daily as needed. Reported on 12/31/2015    . metoprolol tartrate (LOPRESSOR) 100 MG tablet TAKE ONE TABLET BY MOUTH TWICE DAILY 180 tablet 3  . rivaroxaban (XARELTO) 20 MG TABS tablet Take 1 tablet (20 mg total) by mouth daily. 90 tablet 3  .  cephALEXin (KEFLEX) 500 MG capsule Take 1 capsule (500 mg total) by mouth 2 (two) times daily. 20 capsule 0  . SHINGRIX injection as directed.      No facility-administered medications prior to visit.     Allergies  Allergen Reactions  . Cardizem [Diltiazem]     Rash from Yellow dye in generic capsule. Can take different brand. Takes diltiazem - his allergy is to Cardizem  . Sulfa Antibiotics     Unknown childhood reaction    Review of Systems  Constitutional: Negative for fever and malaise/fatigue.  HENT: Negative for congestion.   Eyes: Negative for blurred vision.  Respiratory: Negative for shortness of breath.   Cardiovascular: Negative for chest pain, palpitations and leg swelling.  Gastrointestinal: Negative for abdominal pain, blood in stool and nausea.  Genitourinary: Negative for dysuria and frequency.  Musculoskeletal: Negative for falls.  Skin: Negative for rash.  Neurological: Positive for tingling. Negative for dizziness, loss of consciousness and headaches.  Endo/Heme/Allergies: Negative for environmental allergies.  Psychiatric/Behavioral: Negative for depression. The patient is not nervous/anxious.        Objective:    Physical Exam  Constitutional: He is oriented to person, place, and time. He appears well-developed and well-nourished. No distress.  HENT:  Head: Normocephalic and atraumatic.  Nose: Nose normal.  Eyes: Right eye exhibits no discharge. Left eye exhibits no discharge.  Neck: Normal range of motion. Neck supple.  Cardiovascular: Normal rate.  Murmur heard. irregular  Pulmonary/Chest: Effort normal and breath sounds normal.  Abdominal: Soft. Bowel sounds are normal. There is no tenderness.  Musculoskeletal: He exhibits no edema.  Neurological: He is alert and oriented to person, place, and time.  Skin: Skin is warm and dry.  Psychiatric: He has a normal mood and affect.  Nursing note and vitals reviewed.   BP 124/68 (BP Location: Left  Arm, Patient Position: Sitting, Cuff Size: Normal)   Pulse 83   Temp 98 F (36.7 C) (Oral)   Resp 18   Wt 220 lb 12.8 oz (100.2 kg)   SpO2 98%   BMI 29.95 kg/m  Wt Readings from Last 3 Encounters:  07/13/17 220 lb 12.8 oz (100.2 kg)  04/12/17 220 lb 12.8 oz (100.2 kg)  04/07/17 219 lb 12.8 oz (99.7 kg)   BP Readings from Last 3 Encounters:  07/13/17 124/68  04/12/17 118/62  04/07/17 112/65     Immunization History  Administered Date(s) Administered  . Influenza, High Dose Seasonal PF 06/18/2017  . Influenza-Unspecified 06/11/2016  . Pneumococcal Conjugate-13 07/20/2015  . Pneumococcal Polysaccharide-23 09/05/2016  . Tdap 11/14/2014  . Zoster Recombinat (  Shingrix) 12/26/2016, 03/21/2017    Health Maintenance  Topic Date Due  . Samul Dada  11/13/2024  . INFLUENZA VACCINE  Completed  . PNA vac Low Risk Adult  Completed    Lab Results  Component Value Date   WBC 7.0 07/06/2017   HGB 15.4 07/06/2017   HCT 46.0 07/06/2017   PLT 153.0 07/06/2017   GLUCOSE 95 07/06/2017   CHOL 158 07/06/2017   TRIG 130.0 07/06/2017   HDL 34.00 (L) 07/06/2017   LDLCALC 98 07/06/2017   ALT 15 07/06/2017   AST 18 07/06/2017   NA 139 07/06/2017   K 3.7 07/06/2017   CL 105 07/06/2017   CREATININE 1.24 07/06/2017   BUN 21 07/06/2017   CO2 27 07/06/2017   TSH 1.98 07/06/2017   INR 1.20 11/24/2015   HGBA1C 5.7 07/01/2016    Lab Results  Component Value Date   TSH 1.98 07/06/2017   Lab Results  Component Value Date   WBC 7.0 07/06/2017   HGB 15.4 07/06/2017   HCT 46.0 07/06/2017   MCV 91.9 07/06/2017   PLT 153.0 07/06/2017   Lab Results  Component Value Date   NA 139 07/06/2017   K 3.7 07/06/2017   CO2 27 07/06/2017   GLUCOSE 95 07/06/2017   BUN 21 07/06/2017   CREATININE 1.24 07/06/2017   BILITOT 1.0 07/06/2017   ALKPHOS 68 07/06/2017   AST 18 07/06/2017   ALT 15 07/06/2017   PROT 7.0 07/06/2017   ALBUMIN 3.9 07/06/2017   CALCIUM 9.1 07/06/2017   ANIONGAP 8  11/19/2015   GFR 59.65 (L) 07/06/2017   Lab Results  Component Value Date   CHOL 158 07/06/2017   Lab Results  Component Value Date   HDL 34.00 (L) 07/06/2017   Lab Results  Component Value Date   LDLCALC 98 07/06/2017   Lab Results  Component Value Date   TRIG 130.0 07/06/2017   Lab Results  Component Value Date   CHOLHDL 5 07/06/2017   Lab Results  Component Value Date   HGBA1C 5.7 07/01/2016         Assessment & Plan:   Problem List Items Addressed This Visit    Permanent atrial fibrillation ([place])    Rate controlled and tolerating Xarelto.      Hypertension    Well controlled, no changes to meds. Encouraged heart healthy diet such as the DASH diet and exercise as tolerated.       Relevant Orders   CBC   Comprehensive metabolic panel   TSH   RESOLVED: Tachycardia-bradycardia syndrome ([place])    today      RESOLVED: A-fib ([place])    Doing well, follows with cardiology.       Osteopenia    Encouraged to get adequate exercise, calcium and vitamin d intake      Peripheral neuropathy    No pain just some numbness. Strong family history of neuropathy. Check Vitamin Bs and Zinc today. He declines meds since symptoms are not severe      Relevant Orders   Vitamin B12 (Completed)   Vitamin B1   Folate (Completed)   Zinc   Dyslipidemia    Mild ldl elevation. Encouraged heart healthy diet, increase exercise, avoid trans fats, consider a krill oil cap daily      Relevant Orders   Lipid panel      I have discontinued Ronalee Red Ferns's SHINGRIX and cephALEXin. I am also having him maintain his loratadine, rivaroxaban, diltiazem, irbesartan-hydrochlorothiazide, famciclovir, and metoprolol  tartrate.  No orders of the defined types were placed in this encounter.   CMA served as Education administrator during this visit. History, Physical and Plan performed by medical provider. Documentation and orders reviewed and attested to.  [name], MD

## 2017-07-13 NOTE — Patient Instructions (Signed)
Peripheral Neuropathy Peripheral neuropathy is a type of nerve damage. It affects nerves that carry signals between the spinal cord and other parts of the body. These are called peripheral nerves. With peripheral neuropathy, one nerve or a group of nerves may be damaged. What are the causes? Many things can damage peripheral nerves. For some people with peripheral neuropathy, the cause is unknown. Some causes include:  Diabetes. This is the most common cause of peripheral neuropathy.  Injury to a nerve.  Pressure or stress on a nerve that lasts a long time.  Too little vitamin B. Alcoholism can lead to this.  Infections.  Autoimmune diseases, such as multiple sclerosis and systemic lupus erythematosus.  Inherited nerve diseases.  Some medicines, such as cancer drugs.  Toxic substances, such as lead and mercury.  Too little blood flowing to the legs.  Kidney disease.  Thyroid disease.  What are the signs or symptoms? Different people have different symptoms. The symptoms you have will depend on which of your nerves is damaged. Common symptoms include:  Loss of feeling (numbness) in the feet and hands.  Tingling in the feet and hands.  Pain that burns.  Very sensitive skin.  Weakness.  Not being able to move a part of the body (paralysis).  Muscle twitching.  Clumsiness or poor coordination.  Loss of balance.  Not being able to control your bladder.  Feeling dizzy.  Sexual problems.  How is this diagnosed? Peripheral neuropathy is a symptom, not a disease. Finding the cause of peripheral neuropathy can be hard. To figure that out, your health care provider will take a medical history and do a physical exam. A neurological exam will also be done. This involves checking things affected by your brain, spinal cord, and nerves (nervous system). For example, your health care provider will check your reflexes, how you move, and what you can feel. Other types of tests  may also be ordered, such as:  Blood tests.  A test of the fluid in your spinal cord.  Imaging tests, such as CT scans or an MRI.  Electromyography (EMG). This test checks the nerves that control muscles.  Nerve conduction velocity tests. These tests check how fast messages pass through your nerves.  Nerve biopsy. A small piece of nerve is removed. It is then checked under a microscope.  How is this treated?  Medicine is often used to treat peripheral neuropathy. Medicines may include: ? Pain-relieving medicines. Prescription or over-the-counter medicine may be suggested. ? Antiseizure medicine. This may be used for pain. ? Antidepressants. These also may help ease pain from neuropathy. ? Lidocaine. This is a numbing medicine. You might wear a patch or be given a shot. ? Mexiletine. This medicine is typically used to help control irregular heart rhythms.  Surgery. Surgery may be needed to relieve pressure on a nerve or to destroy a nerve that is causing pain.  Physical therapy to help movement.  Assistive devices to help movement. Follow these instructions at home:  Only take over-the-counter or prescription medicines as directed by your health care provider. Follow the instructions carefully for any given medicines. Do not take any other medicines without first getting approval from your health care provider.  If you have diabetes, work closely with your health care provider to keep your blood sugar under control.  If you have numbness in your feet: ? Check every day for signs of injury or infection. Watch for redness, warmth, and swelling. ? Wear padded socks and comfortable   shoes. These help protect your feet.  Do not do things that put pressure on your damaged nerve.  Do not smoke. Smoking keeps blood from getting to damaged nerves.  Avoid or limit alcohol. Too much alcohol can cause a lack of B vitamins. These vitamins are needed for healthy nerves.  Develop a good  support system. Coping with peripheral neuropathy can be stressful. Talk to a mental health specialist or join a support group if you are struggling.  Follow up with your health care provider as directed. Contact a health care provider if:  You have new signs or symptoms of peripheral neuropathy.  You are struggling emotionally from dealing with peripheral neuropathy.  You have a fever. Get help right away if:  You have an injury or infection that is not healing.  You feel very dizzy or begin vomiting.  You have chest pain.  You have trouble breathing. This information is not intended to replace advice given to you by your health care provider. Make sure you discuss any questions you have with your health care provider. Document Released: 08/12/2002 Document Revised: 01/28/2016 Document Reviewed: 04/29/2013 Elsevier Interactive Patient Education  2017 [employer]  

## 2017-07-13 NOTE — Assessment & Plan Note (Signed)
Well controlled, no changes to meds. Encouraged heart healthy diet such as the DASH diet and exercise as tolerated.  °

## 2017-07-13 NOTE — Assessment & Plan Note (Signed)
Encouraged to get adequate exercise, calcium and vitamin d intake 

## 2017-07-13 NOTE — Assessment & Plan Note (Signed)
Doing well, follows with cardiology 

## 2017-07-13 NOTE — Assessment & Plan Note (Addendum)
Mild ldl elevation. Encouraged heart healthy diet, increase exercise, avoid trans fats, consider a krill oil cap daily

## 2017-07-17 NOTE — Assessment & Plan Note (Addendum)
today

## 2017-07-17 NOTE — Assessment & Plan Note (Signed)
Rate controlled and tolerating Xarelto 

## 2017-07-18 DIAGNOSIS — L570 Actinic keratosis: Secondary | ICD-10-CM | POA: Diagnosis not present

## 2017-07-18 DIAGNOSIS — L578 Other skin changes due to chronic exposure to nonionizing radiation: Secondary | ICD-10-CM | POA: Diagnosis not present

## 2017-07-18 DIAGNOSIS — L821 Other seborrheic keratosis: Secondary | ICD-10-CM | POA: Diagnosis not present

## 2017-07-18 LAB — ZINC: Zinc: 62 ug/dL (ref 60–130)

## 2017-07-18 LAB — VITAMIN B1: Vitamin B1 (Thiamine): 7 nmol/L — ABNORMAL LOW (ref 8–30)

## 2017-07-22 ENCOUNTER — Other Ambulatory Visit: Payer: Self-pay | Admitting: Family Medicine

## 2017-09-04 ENCOUNTER — Ambulatory Visit (INDEPENDENT_AMBULATORY_CARE_PROVIDER_SITE_OTHER): Payer: PPO | Admitting: *Deleted

## 2017-09-04 DIAGNOSIS — I495 Sick sinus syndrome: Secondary | ICD-10-CM

## 2017-09-04 NOTE — Progress Notes (Signed)
Remote pacemaker transmission.   

## 2017-09-05 ENCOUNTER — Other Ambulatory Visit: Payer: Self-pay | Admitting: Family Medicine

## 2017-09-07 LAB — CUP PACEART REMOTE DEVICE CHECK
Battery Remaining Longevity: 136 mo
Battery Remaining Percentage: 95.5 %
Battery Voltage: 2.96 V
Brady Statistic RV Percent Paced: 9.9 %
Date Time Interrogation Session: 20181231083657
Implantable Lead Location: 753860
Implantable Lead Model: 1948
Implantable Pulse Generator Implant Date: 20130528
Lead Channel Pacing Threshold Pulse Width: 0.4 ms
Lead Channel Setting Pacing Amplitude: 2.5 V
Lead Channel Setting Pacing Pulse Width: 0.4 ms
Lead Channel Setting Sensing Sensitivity: 2 mV
MDC IDC LEAD IMPLANT DT: 20130528
MDC IDC MSMT LEADCHNL RV IMPEDANCE VALUE: 630 Ohm
MDC IDC MSMT LEADCHNL RV PACING THRESHOLD AMPLITUDE: 0.5 V
MDC IDC MSMT LEADCHNL RV SENSING INTR AMPL: 12 mV
MDC IDC PG SERIAL: 7328888

## 2017-09-08 ENCOUNTER — Encounter: Payer: Self-pay | Admitting: Cardiology

## 2017-09-12 ENCOUNTER — Other Ambulatory Visit: Payer: Self-pay | Admitting: Family Medicine

## 2017-09-14 ENCOUNTER — Ambulatory Visit (HOSPITAL_BASED_OUTPATIENT_CLINIC_OR_DEPARTMENT_OTHER)
Admission: RE | Admit: 2017-09-14 | Discharge: 2017-09-14 | Disposition: A | Discharge status: Home / Self Care | Payer: PPO | Source: Ambulatory Visit | Attending: Medical | Admitting: Medical

## 2017-09-14 ENCOUNTER — Ambulatory Visit (INDEPENDENT_AMBULATORY_CARE_PROVIDER_SITE_OTHER): Payer: PPO | Admitting: Medical

## 2017-09-14 ENCOUNTER — Encounter: Payer: Self-pay | Admitting: Medical

## 2017-09-14 VITALS — BP 148/71 | HR 71 | Temp 97.4°F | Resp 16 | Ht 72.0 in | Wt 224.6 lb

## 2017-09-14 DIAGNOSIS — M2550 Pain in unspecified joint: Secondary | ICD-10-CM | POA: Diagnosis not present

## 2017-09-14 DIAGNOSIS — M79672 Pain in left foot: Secondary | ICD-10-CM | POA: Diagnosis not present

## 2017-09-14 DIAGNOSIS — M2012 Hallux valgus (acquired), left foot: Secondary | ICD-10-CM | POA: Diagnosis not present

## 2017-09-14 DIAGNOSIS — G629 Polyneuropathy, unspecified: Secondary | ICD-10-CM

## 2017-09-14 LAB — URIC ACID: Uric Acid, Serum: 6.7 mg/dL (ref 4.0–7.8)

## 2017-09-14 LAB — SEDIMENTATION RATE: SED RATE: 10 mm/h (ref 0–20)

## 2017-09-14 LAB — C-REACTIVE PROTEIN: CRP: 0.3 mg/dL — AB (ref 0.5–20.0)

## 2017-09-14 MED ORDER — DICLOFENAC SODIUM 1 % TD GEL: 4.0000 g | Freq: Four times a day (QID) | TRANSDERMAL | 0 refills | Status: DC

## 2017-09-14 MED ORDER — GABAPENTIN 100 MG PO CAPS: 100.0000 mg | ORAL_CAPSULE | Freq: Every day | ORAL | 0 refills | Status: DC

## 2017-09-14 NOTE — Patient Instructions (Signed)
For your recent left foot pain, I do want to get x-ray of your foot and inflammatory lab studies.  I am not convinced it is neuropathic type pain since the pain responded to Voltaren gel.  I did try to refill the Voltaren gel at your pharmacy.  I would use this on an as needed basis.  We will follow x-ray and labs and notify you of the results.  You do have some history of neuropathy as well.  Continue with recommendations advised by your PCP.  I will prescribe gabapentin to use 1 tablet at night if you find that is needed.(Currently your most recent pain does not appear to be neuropathy-like.)  Follow-up in 10-14 days or as needed.

## 2017-09-14 NOTE — Progress Notes (Signed)
Subjective:    Patient ID: Brian Davidson, male    DOB: [birth year], 82 y.o.   MRN: [id number]  HPI   Pt in for some pain on top of his left foot. Pain severe/radiating for past 2 days. Little more at night. Pt used some voltaren gel to area and pain subsided.  He borrowed the gel from his friend.  No trauma and no fall.  Pt does have some neuropathy of both feet. But this pain was more on top of his foot.  Pt diagnosed with neuropathy this fall/november.  He started B1 vitamin and folic acid per his report.  The other night took off shoe and had some redness to foot.  But no redness presently.     Review of Systems  Constitutional: Negative for chills, fatigue and fever.  Respiratory: Negative for cough, chest tightness, shortness of breath and wheezing.   Cardiovascular: Negative for chest pain and palpitations.  Gastrointestinal: Negative for abdominal pain.  Musculoskeletal:       See HPI.  Left foot pain.  Neurological:       History of neuropathy.  Hematological: Negative for adenopathy. Does not bruise/bleed easily.  Psychiatric/Behavioral: Negative for behavioral problems and confusion.   Past Medical History:  Diagnosis Date  . Arthritis   . Biliary dyskinesia   . Carotid artery disease ([place]) 07/01/2016  . Dyslipidemia 07/13/2017  . GERD (gastroesophageal reflux disease)   . H/O measles   . History of chicken pox   . Hypertension   . Kidney stone 03/2015  . Persistent atrial fibrillation ([place])   . Pneumonia 1992  . Tachycardia-bradycardia ([place])    a. s/p STJ dual chamber pacemaker  . Valvular heart disease 07/01/2016     Social History   Socioeconomic History  . Marital status: Married    Spouse name: [name]  . Number of children: 1  . Years of education: Not on file  . Highest education level: Not on file  Social Needs  . Financial resource strain: Not on file  . Food insecurity - worry: Not on file  . Food insecurity - inability: Not on file  .  Transportation needs - medical: Not on file  . Transportation needs - non-medical: Not on file  Occupational History  . Occupation: Retired  . 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 Smoking status: Former Smoker    Years: 10.00    Types: Pipe, Cigars    Last attempt to quit: 11/18/1985    Years since quitting: 31.8  . Smokeless tobacco: Never Used  Substance and Sexual Activity  . Alcohol use: Yes    Alcohol/week: 1.8 oz    Types: 3 Cans of beer per week    Comment: occ  . Drug use: No  . Sexual activity: No  Other Topics Concern  . Not on file  Social History Narrative   Lives with wife, still works part-time as a Geophysicist/field seismologist. He is active around the house and yard...   Parents are deceased and did not have cardiac issues but he has 2 siblings with atrial fibrillation and a sister-in-law  has a pacemaker.    Past Surgical History:  Procedure Laterality Date  . CHOLECYSTECTOMY N/A 11/24/2015   Procedure: LAPAROSCOPIC CHOLECYSTECTOMY;  Surgeon: Greer Pickerel, MD;  Location: [facility];  Service: General;  Laterality: N/A;  . CYSTOSCOPY  2016  . FRACTURE SURGERY  2005   left ankle  . HERNIA REPAIR  left groin, no mesh  . PERMANENT PACEMAKER INSERTION N/A 01/31/2012   SJM Accent SR RF implanted by DR Allred    Family History  Problem Relation Age of Onset  . Hypertension Mother   . Arthritis Mother   . Drug abuse Mother   . Stroke Father   . Heart disease Father   . Heart disease Sister 34  . Heart disease Brother   . Hypertension Son   . Heart disease Sister   . Neuropathy Brother   . Hypertension Brother   . Atrial fibrillation Brother   . Atrial fibrillation Sister   . Colon cancer Neg Hx     Allergies  Allergen Reactions  . Cardizem [Diltiazem]     Rash from Yellow dye in generic capsule. Can take different brand. Takes diltiazem - his allergy is to Cardizem  . Sulfa Antibiotics     Unknown childhood reaction    Current Outpatient  Medications on File Prior to Visit  Medication Sig Dispense Refill  . diltiazem (CARDIZEM CD) 240 MG 24 hr capsule TAKE ONE (1) CAPSULE EACH DAY 30 capsule 6  . irbesartan-hydrochlorothiazide (AVALIDE) 150-12.5 MG tablet TAKE 1/2 (ONE-HALF) TABLET BY MOUTH ONCE DAILY 90 tablet 0  . loratadine (CLARITIN) 10 MG tablet Take 10 mg by mouth daily as needed. Reported on 12/31/2015    . metoprolol tartrate (LOPRESSOR) 100 MG tablet TAKE ONE TABLET BY MOUTH TWICE DAILY 180 tablet 3  . XARELTO 20 MG TABS tablet TAKE ONE TABLET BY MOUTH ONCE DAILY 90 tablet 3   No current facility-administered medications on file prior to visit.     BP (!) 148/71   Pulse 71   Temp (!) 97.4 F (36.3 C) (Oral)   Resp 16   Ht 6' (1.829 m)   Wt 224 lb 9.6 oz (101.9 kg)   SpO2 100%   BMI 30.46 kg/m      Objective:   Physical Exam   General- No acute distress. Pleasant patient. Neck- Full range of motion, no jvd Lungs- Clear, even and unlabored. Heart-regular, irregular.(History of atrial fibrillation) rate controlled presently. Neurologic- CNII- XII grossly intact.  Left foot- he does appear to have some hallux valgus present.  No direct tenderness to palpation over foot presently.  No warmth.  Pulses are intact.     Assessment & Plan:  For your recent left foot pain, I do want to get x-ray of your foot and inflammatory lab studies.  I am not convinced it is neuropathic type pain since the pain responded to Voltaren gel.  I did try to refill the Voltaren gel at your pharmacy.  I would use this on an as needed basis.  We will follow x-ray and labs and notify you of the results.  You do have some history of neuropathy as well.  Continue with recommendations advised by your PCP.  I will prescribe gabapentin to use 1 tablet at night if you find that is needed.(Currently your most recent pain does not appear to be neuropathy-like.)  Follow-up in 10-14 days or as needed.  Melena Hayes, Percell Miller, PA-C

## 2017-09-15 DIAGNOSIS — H02054 Trichiasis without entropian left upper eyelid: Secondary | ICD-10-CM | POA: Diagnosis not present

## 2017-09-15 DIAGNOSIS — H16102 Unspecified superficial keratitis, left eye: Secondary | ICD-10-CM | POA: Diagnosis not present

## 2017-09-15 LAB — RHEUMATOID FACTOR

## 2017-09-15 LAB — ANA: ANA: NEGATIVE

## 2017-09-19 ENCOUNTER — Other Ambulatory Visit: Payer: Self-pay | Admitting: Medical

## 2017-09-20 ENCOUNTER — Ambulatory Visit (INDEPENDENT_AMBULATORY_CARE_PROVIDER_SITE_OTHER): Payer: PPO | Admitting: Medical

## 2017-09-20 ENCOUNTER — Encounter: Payer: Self-pay | Admitting: Medical

## 2017-09-20 VITALS — BP 119/66 | HR 70 | Temp 98.0°F | Resp 16 | Ht 72.0 in | Wt 221.2 lb

## 2017-09-20 DIAGNOSIS — J40 Bronchitis, not specified as acute or chronic: Secondary | ICD-10-CM | POA: Diagnosis not present

## 2017-09-20 DIAGNOSIS — R05 Cough: Secondary | ICD-10-CM | POA: Diagnosis not present

## 2017-09-20 DIAGNOSIS — J0100 Acute maxillary sinusitis, unspecified: Secondary | ICD-10-CM | POA: Diagnosis not present

## 2017-09-20 DIAGNOSIS — R059 Cough, unspecified: Secondary | ICD-10-CM

## 2017-09-20 MED ORDER — DOXYCYCLINE HYCLATE 100 MG PO TABS: 100.0000 mg | ORAL_TABLET | Freq: Two times a day (BID) | ORAL | 0 refills | Status: DC

## 2017-09-20 MED ORDER — FLUTICASONE PROPIONATE 50 MCG/ACT NA SUSP: 2.0000 | Freq: Every day | NASAL | 1 refills | Status: DC

## 2017-09-20 MED ORDER — HYDROCODONE-HOMATROPINE 5-1.5 MG/5ML PO SYRP: 5.0000 mL | ORAL_SOLUTION | Freq: Three times a day (TID) | ORAL | 0 refills | Status: DC | PRN

## 2017-09-20 MED ORDER — BENZONATATE 100 MG PO CAPS: 100.0000 mg | ORAL_CAPSULE | Freq: Three times a day (TID) | ORAL | 0 refills | Status: DC | PRN

## 2017-09-20 NOTE — Progress Notes (Signed)
Subjective:    Patient ID: Brian Davidson, male    DOB: Jan 24, 1936, 82 y.o.   MRN: [id number]  HPI   Nasal congestion, chest congestion, sneeznig, sinus pressure and cough. Faint st. Symptoms for 4 days. No fever but then states sweating today easily. No diffuse body aches.  Pt tried allegra but did not help. He did start mucinex and tessalon perles.  This morning felt the worse.    Review of Systems  Constitutional: Negative for chills, fatigue and fever.  HENT: Positive for congestion, sinus pressure and sinus pain. Negative for ear pain.   Respiratory: Positive for cough. Negative for chest tightness, shortness of breath and wheezing.   Cardiovascular: Negative for chest pain and palpitations.  Gastrointestinal: Negative for abdominal pain.  Musculoskeletal: Negative for back pain, myalgias and neck pain.  Neurological: Negative for dizziness, light-headedness, numbness and headaches.  Hematological: Negative for adenopathy. Does not bruise/bleed easily.  Psychiatric/Behavioral: Negative for behavioral problems and confusion.    Past Medical History:  Diagnosis Date  . Arthritis   . Biliary dyskinesia   . Carotid artery disease ([place]) 07/01/2016  . Dyslipidemia 07/13/2017  . GERD (gastroesophageal reflux disease)   . H/O measles   . History of chicken pox   . Hypertension   . Kidney stone 03/2015  . Persistent atrial fibrillation ([place])   . Pneumonia 1992  . Tachycardia-bradycardia ([place])    a. s/p STJ dual chamber pacemaker  . Valvular heart disease 07/01/2016     Social History   Socioeconomic History  . Marital status: Married    Spouse name: [name]  . Number of children: 1  . Years of education: Not on file  . Highest education level: Not on file  Social Needs  . Financial resource strain: Not on file  . Food insecurity - worry: Not on file  . Food insecurity - inability: Not on file  . Transportation needs - medical: Not on file  . Transportation needs -  non-medical: Not on file  Occupational History  . Occupation: Retired  . 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 Smoking status: Former Smoker    Years: 10.00    Types: Pipe, Cigars    Last attempt to quit: 11/18/1985    Years since quitting: 31.8  . Smokeless tobacco: Never Used  Substance and Sexual Activity  . Alcohol use: Yes    Alcohol/week: 1.8 oz    Types: 3 Cans of beer per week    Comment: occ  . Drug use: No  . Sexual activity: No  Other Topics Concern  . Not on file  Social History Narrative   Lives with wife, still works part-time as a Geophysicist/field seismologist. He is active around the house and yard...   Parents are deceased and did not have cardiac issues but he has 2 siblings with atrial fibrillation and a sister-in-law  has a pacemaker.    Past Surgical History:  Procedure Laterality Date  . CHOLECYSTECTOMY N/A 11/24/2015   Procedure: LAPAROSCOPIC CHOLECYSTECTOMY;  Surgeon: Greer Pickerel, MD;  Location: [facility];  Service: General;  Laterality: N/A;  . CYSTOSCOPY  2016  . FRACTURE SURGERY  2005   left ankle  . HERNIA REPAIR     left groin, no mesh  . PERMANENT PACEMAKER INSERTION N/A 01/31/2012   SJM Accent SR RF implanted by DR Allred    Family History  Problem Relation Age of Onset  . Hypertension Mother   . Arthritis Mother   .  Drug abuse Mother   . Stroke Father   . Heart disease Father   . Heart disease Sister 47  . Heart disease Brother   . Hypertension Son   . Heart disease Sister   . Neuropathy Brother   . Hypertension Brother   . Atrial fibrillation Brother   . Atrial fibrillation Sister   . Colon cancer Neg Hx     Allergies  Allergen Reactions  . Cardizem [Diltiazem]     Rash from Yellow dye in generic capsule. Can take different brand. Takes diltiazem - his allergy is to Cardizem  . Sulfa Antibiotics     Unknown childhood reaction    Current Outpatient Medications on File Prior to Visit  Medication Sig Dispense Refill  .  diclofenac sodium (VOLTAREN) 1 % GEL Apply 4 g topically 4 (four) times daily. 100 g 0  . diltiazem (CARDIZEM CD) 240 MG 24 hr capsule TAKE ONE (1) CAPSULE EACH DAY 30 capsule 6  . gabapentin (NEURONTIN) 100 MG capsule Take 1 capsule (100 mg total) by mouth at bedtime. 30 capsule 0  . irbesartan-hydrochlorothiazide (AVALIDE) 150-12.5 MG tablet TAKE 1/2 (ONE-HALF) TABLET BY MOUTH ONCE DAILY 90 tablet 0  . loratadine (CLARITIN) 10 MG tablet Take 10 mg by mouth daily as needed. Reported on 12/31/2015    . metoprolol tartrate (LOPRESSOR) 100 MG tablet TAKE ONE TABLET BY MOUTH TWICE DAILY 180 tablet 3  . XARELTO 20 MG TABS tablet TAKE ONE TABLET BY MOUTH ONCE DAILY 90 tablet 3   No current facility-administered medications on file prior to visit.     BP 119/66   Pulse 70   Temp 98 F (36.7 C) (Oral)   Resp 16   Ht 6' (1.829 m)   Wt 221 lb 3.2 oz (100.3 kg)   SpO2 99%   BMI 30.00 kg/m       Objective:   Physical Exam   General  Mental Status - Alert. General Appearance - Well groomed. Not in acute distress.  Skin Rashes- No Rashes.  HEENT Head- Normal. Ear Auditory Canal - Left- Normal. Right - Normal.Tympanic Membrane- Left- Normal. Right- Normal. Eye Sclera/Conjunctiva- Left- Normal. Right- Normal. Nose & Sinuses Nasal Mucosa- Left-  Boggy and Congested. Right-  Boggy and  Congested.Bilateral maxillary and frontal sinus pressure. Mouth & Throat Lips: Upper Lip- Normal: no dryness, cracking, pallor, cyanosis, or vesicular eruption. Lower Lip-Normal: no dryness, cracking, pallor, cyanosis or vesicular eruption. Buccal Mucosa- Bilateral- No Aphthous ulcers. Oropharynx- No Discharge or Erythema. Tonsils: Characteristics- Bilateral- No Erythema or Congestion. Size/Enlargement- Bilateral- No enlargement. Discharge- bilateral-None.  Neck Neck- Supple. No Masses.   Chest and Lung Exam Auscultation: Breath Sounds:-Clear even and unlabored.  Cardiovascular Auscultation:Rythm-  Regular, rate and rhythm. Murmurs & Other Heart Sounds:Ausculatation of the heart reveal- No Murmurs.  Lymphatic Head & Neck General Head & Neck Lymphatics: Bilateral: Description- No Localized lymphadenopathy.     Assessment & Plan:  You appear to have a sinus infection and bronchitis. I am prescribing doxycycline antibiotic for the infection. To help with the nasal congestion I prescribed flonase  nasal steroid. For your associated cough, I prescribed cough medicine use your hycodan.  Rest, hydrate, tylenol for fever.  Follow up in 7 days or as needed.  Nayely Dingus, Percell Miller, PA-C

## 2017-09-20 NOTE — Patient Instructions (Addendum)
You appear to have a sinus infection and bronchitis. I am prescribing doxycycline antibiotic for the infection. To help with the nasal congestion I prescribed flonase  nasal steroid. For your associated cough, I prescribed cough medicine use your hycodan.  Rest, hydrate, tylenol for fever.  Follow up in 7 days or as needed.

## 2017-10-10 DIAGNOSIS — H26491 Other secondary cataract, right eye: Secondary | ICD-10-CM | POA: Diagnosis not present

## 2017-10-17 DIAGNOSIS — H26492 Other secondary cataract, left eye: Secondary | ICD-10-CM | POA: Diagnosis not present

## 2017-10-27 ENCOUNTER — Encounter: Payer: Self-pay | Admitting: Medical

## 2017-10-27 ENCOUNTER — Ambulatory Visit (HOSPITAL_BASED_OUTPATIENT_CLINIC_OR_DEPARTMENT_OTHER)
Admission: RE | Admit: 2017-10-27 | Discharge: 2017-10-27 | Disposition: A | Discharge status: Home / Self Care | Payer: PPO | Source: Ambulatory Visit | Attending: Medical | Admitting: Medical

## 2017-10-27 ENCOUNTER — Ambulatory Visit (INDEPENDENT_AMBULATORY_CARE_PROVIDER_SITE_OTHER): Payer: PPO | Admitting: Medical

## 2017-10-27 VITALS — BP 142/82 | HR 78 | Temp 98.3°F | Resp 16 | Ht 72.0 in | Wt 224.4 lb

## 2017-10-27 DIAGNOSIS — R0781 Pleurodynia: Secondary | ICD-10-CM

## 2017-10-27 DIAGNOSIS — S2242XA Multiple fractures of ribs, left side, initial encounter for closed fracture: Secondary | ICD-10-CM | POA: Diagnosis not present

## 2017-10-27 DIAGNOSIS — X58XXXA Exposure to other specified factors, initial encounter: Secondary | ICD-10-CM | POA: Insufficient documentation

## 2017-10-27 MED ORDER — PREDNISONE 10 MG PO TABS: ORAL_TABLET | ORAL | 0 refills | Status: DC

## 2017-10-27 MED ORDER — TRAMADOL HCL 50 MG PO TABS: 50.0000 mg | ORAL_TABLET | Freq: Four times a day (QID) | ORAL | 0 refills | Status: DC | PRN

## 2017-10-27 NOTE — Progress Notes (Signed)
Subjective:    Patient ID: Brian Davidson, male    DOB: [birth year], 82 y.o.   MRN: [id number]  HPI  Pt in states he was trimming some hedges and tripped. He states he landed very hard on his left side. He describes that left scapula area took brunt of injury. He has bilateral rib pain. Thorax feels diffusley sore. If he takes deep breath will have moderate to severe pain. No shortness.  Pt states laying in bed increases pain. He could lay in recliner and pain was not too bad.  Pt took tylenol and helped a little.   Review of Systems  Constitutional: Negative for chills, fatigue and fever.  Respiratory: Negative for cough, chest tightness, shortness of breath and wheezing.   Cardiovascular: Negative for chest pain and palpitations.  Gastrointestinal: Negative for anal bleeding.  Musculoskeletal: Negative for back pain.       See HPI.  Skin: Negative for rash.       No bruising  Neurological: Negative for dizziness, seizures, syncope and weakness.  Hematological: Negative for adenopathy. Does not bruise/bleed easily.  Psychiatric/Behavioral: Negative for behavioral problems and confusion.    Past Medical History:  Diagnosis Date  . Arthritis   . Biliary dyskinesia   . Carotid artery disease ([place]) 07/01/2016  . Dyslipidemia 07/13/2017  . GERD (gastroesophageal reflux disease)   . H/O measles   . History of chicken pox   . Hypertension   . Kidney stone 03/2015  . Persistent atrial fibrillation ([place])   . Pneumonia 1992  . Tachycardia-bradycardia ([place])    a. s/p STJ dual chamber pacemaker  . Valvular heart disease 07/01/2016     Social History   Socioeconomic History  . Marital status: Married    Spouse name: [name]  . Number of children: 1  . Years of education: Not on file  . Highest education level: Not on file  Social Needs  . Financial resource strain: Not on file  . Food insecurity - worry: Not on file  . Food insecurity - inability: Not on file  .  Transportation needs - medical: Not on file  . Transportation needs - non-medical: Not on file  Occupational History  . Occupation: Retired  . 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 Smoking status: Former Smoker    Years: 10.00    Types: Pipe, Cigars    Last attempt to quit: 11/18/1985    Years since quitting: 31.9  . Smokeless tobacco: Never Used  Substance and Sexual Activity  . Alcohol use: Yes    Alcohol/week: 1.8 oz    Types: 3 Cans of beer per week    Comment: occ  . Drug use: No  . Sexual activity: No  Other Topics Concern  . Not on file  Social History Narrative   Lives with wife, still works part-time as a Geophysicist/field seismologist. He is active around the house and yard...   Parents are deceased and did not have cardiac issues but he has 2 siblings with atrial fibrillation and a sister-in-law  has a pacemaker.    Past Surgical History:  Procedure Laterality Date  . CHOLECYSTECTOMY N/A 11/24/2015   Procedure: LAPAROSCOPIC CHOLECYSTECTOMY;  Surgeon: Greer Pickerel, MD;  Location: [facility];  Service: General;  Laterality: N/A;  . CYSTOSCOPY  2016  . FRACTURE SURGERY  2005   left ankle  . HERNIA REPAIR     left groin, no mesh  . PERMANENT PACEMAKER INSERTION N/A 01/31/2012  SJM Accent SR RF implanted by DR Allred    Family History  Problem Relation Age of Onset  . Hypertension Mother   . Arthritis Mother   . Drug abuse Mother   . Stroke Father   . Heart disease Father   . Heart disease Sister 59  . Heart disease Brother   . Hypertension Son   . Heart disease Sister   . Neuropathy Brother   . Hypertension Brother   . Atrial fibrillation Brother   . Atrial fibrillation Sister   . Colon cancer Neg Hx     Allergies  Allergen Reactions  . Cardizem [Diltiazem]     Rash from Yellow dye in generic capsule. Can take different brand. Takes diltiazem - his allergy is to Cardizem  . Sulfa Antibiotics     Unknown childhood reaction    Current Outpatient  Medications on File Prior to Visit  Medication Sig Dispense Refill  . diclofenac sodium (VOLTAREN) 1 % GEL Apply 4 g topically 4 (four) times daily. 100 g 0  . diltiazem (CARDIZEM CD) 240 MG 24 hr capsule TAKE ONE (1) CAPSULE EACH DAY 30 capsule 6  . fluticasone (FLONASE) 50 MCG/ACT nasal spray Place 2 sprays into both nostrils daily. 16 g 1  . gabapentin (NEURONTIN) 100 MG capsule Take 1 capsule (100 mg total) by mouth at bedtime. 30 capsule 0  . irbesartan-hydrochlorothiazide (AVALIDE) 150-12.5 MG tablet TAKE 1/2 (ONE-HALF) TABLET BY MOUTH ONCE DAILY 90 tablet 0  . loratadine (CLARITIN) 10 MG tablet Take 10 mg by mouth daily as needed. Reported on 12/31/2015    . metoprolol tartrate (LOPRESSOR) 100 MG tablet TAKE ONE TABLET BY MOUTH TWICE DAILY 180 tablet 3  . XARELTO 20 MG TABS tablet TAKE ONE TABLET BY MOUTH ONCE DAILY 90 tablet 3   No current facility-administered medications on file prior to visit.     BP (!) 142/82   Pulse 78   Temp 98.3 F (36.8 C) (Oral)   Resp 16   Ht 6' (1.829 m)   Wt 224 lb 6.4 oz (101.8 kg)   SpO2 99%   BMI 30.43 kg/m       Objective:   Physical Exam  General- No acute distress. Pleasant patient. Neck- Full range of motion, no jvd Lungs- Clear, even and unlabored.  But slight shallow respirations as he indicates deep respirations Heart- regular rate and rhythm. Neurologic- CNII- XII grossly intact.  Back-no mid spinal tenderness to palpation.  Also on direct palpation of the left scapula no pain at all.  Left shoulder-has good range of motion with no pain on palpation.  Anterior thorax-mild tenderness to palpation over both costochondral junctions.  Right lower rib pain tender to palpation.  Left lower rib mid axillary region moderate to severe tenderness to palpation.      Assessment & Plan:  With your  recent fall and rib pain, I do want to get chest x-ray with rib views.  I have concern for possible fracture in the left rib area.  He can  continue Tylenol for mild pain and for more severe pain use tramadol.    Since you do have diffuse soreness as well over the entire thorax, I do think a 3-day taper dose of prednisone would be beneficial.  Decided to prescribe this since NSAIDs not recommended with Xarelto.  Will notify you of x-ray results when they are in.  Follow-up date will be determined based on x-ray results and how you respond to the  above.  Even if you do have rib fracture try to breathe deep occasionally to avoid potential pneumonia.  Mackie Pai, PA-C

## 2017-10-27 NOTE — Patient Instructions (Signed)
With your  recent fall and rib pain, I do want to get chest x-ray with rib views.  I have concern for possible fracture in the left rib area.  He can continue Tylenol for mild pain and for more severe pain use tramadol.    Since you do have diffuse soreness as well over the entire thorax, I do think a 3-day taper dose of prednisone would be beneficial.  Decided to prescribe this since NSAIDs not recommended with Xarelto.  Will notify you of x-ray results when they are in.  Follow-up date will be determined based on x-ray results and how you respond to the above.  Even if you do have rib fracture try to breathe deep occasionally to avoid potential pneumonia.

## 2017-11-01 ENCOUNTER — Other Ambulatory Visit: Payer: Self-pay | Admitting: Medical

## 2017-11-01 ENCOUNTER — Encounter: Payer: Self-pay | Admitting: Medical

## 2017-11-01 ENCOUNTER — Ambulatory Visit (INDEPENDENT_AMBULATORY_CARE_PROVIDER_SITE_OTHER): Payer: PPO | Admitting: Medical

## 2017-11-01 VITALS — BP 123/66 | HR 67 | Temp 98.0°F | Resp 16 | Ht 72.0 in | Wt 228.4 lb

## 2017-11-01 DIAGNOSIS — Z79899 Other long term (current) drug therapy: Secondary | ICD-10-CM | POA: Diagnosis not present

## 2017-11-01 DIAGNOSIS — S2232XK Fracture of one rib, left side, subsequent encounter for fracture with nonunion: Secondary | ICD-10-CM

## 2017-11-01 MED ORDER — TRAMADOL HCL 50 MG PO TABS: 50.0000 mg | ORAL_TABLET | Freq: Three times a day (TID) | ORAL | 0 refills | Status: DC | PRN

## 2017-11-01 MED ORDER — GABAPENTIN 100 MG PO CAPS: 100.0000 mg | ORAL_CAPSULE | Freq: Every day | ORAL | 0 refills | Status: DC

## 2017-11-01 MED ORDER — GABAPENTIN 300 MG PO CAPS: 300.0000 mg | ORAL_CAPSULE | Freq: Every day | ORAL | 0 refills | Status: DC

## 2017-11-01 NOTE — Telephone Encounter (Signed)
Copied from [place]. Topic: General - Other >> Nov 01, 2017  3:37 PM Darl Householder, RMA wrote: Reason for CRM: Medication refill request for gabapentin (NEURONTIN) 100 MG to be sent to [facility]

## 2017-11-01 NOTE — Patient Instructions (Addendum)
You do have moderate to severe pain still in left lower rib region from prior fracture.  I am going to refill your tramadol.  I do think it is a good idea to combine Tylenol with this to help with the pain.  You report trying the Tylenol and tramadol combination recently and seemed to help a lot.  Since pain in the ribs might last as long is 8 weeks, I do think it is best for you to go ahead and sign a controlled medication contract and give a UDS.  When you run out of the tramadol prescription given today please let me know and I could refill that if needed.  Follow-up in 1 month or as needed.

## 2017-11-01 NOTE — Progress Notes (Signed)
Subjective:    Patient ID: Brian Davidson, male    DOB: 01-10-1936, 82 y.o.   MRN: [id number]  HPI  Pt in for follow up. He is in for rib fracture follow up. Pt has some difficulty sleeping due to pain.   Pt states when  he added tylenol this seemed to help. He only took tramadol 1 tab dose. Never took 2 tabs.  Pt declines stronger medication such as norco. He indicates wants to minimize number of tabs as he is hoping pain will decrease in near future.    Review of Systems  Constitutional: Negative for chills, fatigue and fever.  Respiratory: Negative for cough, chest tightness, shortness of breath and wheezing.   Cardiovascular: Negative for chest pain and palpitations.  Musculoskeletal:       Rib pain.  Neurological: Negative for dizziness, seizures, speech difficulty, weakness, light-headedness, numbness and headaches.       Neuropathy. Hx of. Pt wants stronger dose gabapentin.  Hematological: Negative for adenopathy. Does not bruise/bleed easily.   Past Medical History:  Diagnosis Date  . Arthritis   . Biliary dyskinesia   . Carotid artery disease ([place]) 07/01/2016  . Dyslipidemia 07/13/2017  . GERD (gastroesophageal reflux disease)   . H/O measles   . History of chicken pox   . Hypertension   . Kidney stone 03/2015  . Persistent atrial fibrillation ([place])   . Pneumonia 1992  . Tachycardia-bradycardia ([place])    a. s/p STJ dual chamber pacemaker  . Valvular heart disease 07/01/2016     Social History   Socioeconomic History  . Marital status: Married    Spouse name: [name]  . Number of children: 1  . Years of education: Not on file  . Highest education level: Not on file  Social Needs  . Financial resource strain: Not on file  . Food insecurity - worry: Not on file  . Food insecurity - inability: Not on file  . Transportation needs - medical: Not on file  . Transportation needs - non-medical: Not on file  Occupational History  . Occupation: Retired  .  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 Smoking status: Former Smoker    Years: 10.00    Types: Pipe, Cigars    Last attempt to quit: 11/18/1985    Years since quitting: 31.9  . Smokeless tobacco: Never Used  Substance and Sexual Activity  . Alcohol use: Yes    Alcohol/week: 1.8 oz    Types: 3 Cans of beer per week    Comment: occ  . Drug use: No  . Sexual activity: No  Other Topics Concern  . Not on file  Social History Narrative   Lives with wife, still works part-time as a Geophysicist/field seismologist. He is active around the house and yard...   Parents are deceased and did not have cardiac issues but he has 2 siblings with atrial fibrillation and a sister-in-law  has a pacemaker.    Past Surgical History:  Procedure Laterality Date  . CHOLECYSTECTOMY N/A 11/24/2015   Procedure: LAPAROSCOPIC CHOLECYSTECTOMY;  Surgeon: Greer Pickerel, MD;  Location: [facility];  Service: General;  Laterality: N/A;  . CYSTOSCOPY  2016  . FRACTURE SURGERY  2005   left ankle  . HERNIA REPAIR     left groin, no mesh  . PERMANENT PACEMAKER INSERTION N/A 01/31/2012   SJM Accent SR RF implanted by DR Allred    Family History  Problem Relation Age of Onset  .  Hypertension Mother   . Arthritis Mother   . Drug abuse Mother   . Stroke Father   . Heart disease Father   . Heart disease Sister 12  . Heart disease Brother   . Hypertension Son   . Heart disease Sister   . Neuropathy Brother   . Hypertension Brother   . Atrial fibrillation Brother   . Atrial fibrillation Sister   . Colon cancer Neg Hx     Allergies  Allergen Reactions  . Cardizem [Diltiazem]     Rash from Yellow dye in generic capsule. Can take different brand. Takes diltiazem - his allergy is to Cardizem  . Sulfa Antibiotics     Unknown childhood reaction    Current Outpatient Medications on File Prior to Visit  Medication Sig Dispense Refill  . diclofenac sodium (VOLTAREN) 1 % GEL Apply 4 g topically 4 (four) times daily.  100 g 0  . diltiazem (CARDIZEM CD) 240 MG 24 hr capsule TAKE ONE (1) CAPSULE EACH DAY 30 capsule 6  . fluticasone (FLONASE) 50 MCG/ACT nasal spray Place 2 sprays into both nostrils daily. 16 g 1  . gabapentin (NEURONTIN) 100 MG capsule Take 1 capsule (100 mg total) by mouth at bedtime. 30 capsule 0  . irbesartan-hydrochlorothiazide (AVALIDE) 150-12.5 MG tablet TAKE 1/2 (ONE-HALF) TABLET BY MOUTH ONCE DAILY 90 tablet 0  . loratadine (CLARITIN) 10 MG tablet Take 10 mg by mouth daily as needed. Reported on 12/31/2015    . metoprolol tartrate (LOPRESSOR) 100 MG tablet TAKE ONE TABLET BY MOUTH TWICE DAILY 180 tablet 3  . traMADol (ULTRAM) 50 MG tablet Take 1 tablet (50 mg total) by mouth every 6 (six) hours as needed. 16 tablet 0  . XARELTO 20 MG TABS tablet TAKE ONE TABLET BY MOUTH ONCE DAILY 90 tablet 3   No current facility-administered medications on file prior to visit.     BP 123/66   Pulse 67   Temp 98 F (36.7 C) (Oral)   Resp 16   Ht 6' (1.829 m)   Wt 228 lb 6.4 oz (103.6 kg)   SpO2 97%   BMI 30.98 kg/m       Objective:   Physical Exam  General- No acute distress. Pleasant patient. Neck- Full range of motion, no jvd Lungs- Clear, even and unlabored. Heart- regular rate and rhythm. Neurologic- CNII- XII grossly intact.  Anterior thorax- left lower rib region pain on palpation.      Assessment & Plan:  You do have moderate to severe pain still in left lower rib region from prior fracture.  I am going to refill your tramadol.  I do think it is a good idea to combine Tylenol with this to help with the pain.  You report trying the Tylenol and tramadol combination recently and seemed to help a lot.  Since pain in the ribs might last as long is 8 weeks, I do think it is best for you to go ahead and sign a controlled medication contract and give a UDS.  When you run out of  the tramadol prescription given today please let me know and I could refill that if needed.  Follow-up  in 1 month or as needed.

## 2017-11-01 NOTE — Telephone Encounter (Signed)
Gabapentin refill Last OV: 09/14/17 Last Refill:09/14/17 Pharmacy:[facility]

## 2017-11-02 LAB — PAIN MGMT, PROFILE 8 W/CONF, U
6 ACETYLMORPHINE: NEGATIVE ng/mL (ref <10)
AMPHETAMINES: NEGATIVE ng/mL (ref <500)
Alcohol Metabolites: NEGATIVE ng/mL (ref <500)
Benzodiazepines: NEGATIVE ng/mL (ref <100)
Buprenorphine, Urine: NEGATIVE ng/mL (ref <5)
Cocaine Metabolite: NEGATIVE ng/mL (ref <150)
Creatinine: 74.7 mg/dL
MDMA: NEGATIVE ng/mL (ref <500)
Marijuana Metabolite: NEGATIVE ng/mL (ref <20)
OXIDANT: NEGATIVE ug/mL (ref <200)
OXYCODONE: NEGATIVE ng/mL (ref <100)
Opiates: NEGATIVE ng/mL (ref <100)
pH: 6.55 (ref 4.5–9.0)

## 2017-12-04 ENCOUNTER — Other Ambulatory Visit: Payer: Self-pay | Admitting: Medical

## 2017-12-04 ENCOUNTER — Ambulatory Visit (INDEPENDENT_AMBULATORY_CARE_PROVIDER_SITE_OTHER): Payer: PPO | Admitting: *Deleted

## 2017-12-04 DIAGNOSIS — I495 Sick sinus syndrome: Secondary | ICD-10-CM | POA: Diagnosis not present

## 2017-12-04 NOTE — Progress Notes (Signed)
Remote pacemaker transmission.   

## 2017-12-05 ENCOUNTER — Encounter: Payer: Self-pay | Admitting: Cardiology

## 2017-12-15 ENCOUNTER — Ambulatory Visit: Payer: Self-pay | Admitting: *Deleted

## 2017-12-15 NOTE — Telephone Encounter (Signed)
Pt called with c/o dizziness.He states he had gone to [employer] and was getting out of the truck and felt some dizziness. He was able to drive home and check his b/p. B/Ps have been 105/56 to 116/56 with a heart rate of 59-63. He is on several b/p meds and he has taken them as prescribed. His last b/p reading was 114/62 with hr of 62. Per protocol he needs to be assessed at and [facility] or Emergency department.  Santiago Glad at [facility] notified and advised to go to urgent care of emergency department.  Pt advised to go to ED. Stated he would if the dizziness starts back. Advised to call back with any worsening symptoms, pt voiced understanding.  Reason for Disposition . [1] Systolic BP [id number] AND [4] taking blood pressure medications AND [3] dizzy, lightheaded or weak  Answer Assessment - Initial Assessment Questions 1. BLOOD PRESSURE: "What is the blood pressure?" "Did you take at least two measurements 5 minutes apart?"     116/56, 114/50,  106/53, 105/56  Heart rate 59-63 2. ONSET: "When did you take your blood pressure?"     Within the last 15 mins 3. HOW: "How did you obtain the blood pressure?" (e.g., visiting nurse, automatic home BP monitor)     Automatic home BP monitor 4. HISTORY: "Do you have a history of low blood pressure?" "What is your blood pressure normally?"     No. Has high blood pressure 5. MEDICATIONS: "Are you taking any medications for blood pressure?" If yes: "Have they been changed recently?"     Yes taking as prescribed 6. PULSE RATE: "Do you know what your pulse rate is?"      59-63 7. OTHER SYMPTOMS: "Have you been sick recently?" "Have you had a recent injury?"     no 8. PREGNANCY: "Is there any chance you are pregnant?" "When was your last menstrual period?"     no  Protocols used: LOW BLOOD PRESSURE-A-AH

## 2017-12-15 NOTE — Telephone Encounter (Signed)
FYI to PCP

## 2017-12-16 NOTE — Telephone Encounter (Signed)
Please check in with pateint and make sure he is feeling better, set up follow up if he wants it.

## 2017-12-19 LAB — CUP PACEART REMOTE DEVICE CHECK
Brady Statistic RV Percent Paced: 10 %
Date Time Interrogation Session: 20190401070933
Implantable Lead Location: 753860
Lead Channel Impedance Value: 630 Ohm
Lead Channel Pacing Threshold Pulse Width: 0.4 ms
Lead Channel Sensing Intrinsic Amplitude: 12 mV
Lead Channel Setting Pacing Amplitude: 2.5 V
MDC IDC LEAD IMPLANT DT: 20130528
MDC IDC MSMT BATTERY REMAINING LONGEVITY: 136 mo
MDC IDC MSMT BATTERY REMAINING PERCENTAGE: 95.5 %
MDC IDC MSMT BATTERY VOLTAGE: 2.96 V
MDC IDC MSMT LEADCHNL RV PACING THRESHOLD AMPLITUDE: 0.5 V
MDC IDC PG IMPLANT DT: 20130528
MDC IDC SET LEADCHNL RV PACING PULSEWIDTH: 0.4 ms
MDC IDC SET LEADCHNL RV SENSING SENSITIVITY: 2 mV
Pulse Gen Model: 1210
Pulse Gen Serial Number: 7328888

## 2017-12-19 NOTE — Telephone Encounter (Signed)
Called patient left message for patient to call the ofc back

## 2018-01-09 ENCOUNTER — Other Ambulatory Visit: Payer: Self-pay | Admitting: Medical

## 2018-01-12 ENCOUNTER — Other Ambulatory Visit (INDEPENDENT_AMBULATORY_CARE_PROVIDER_SITE_OTHER): Payer: PPO

## 2018-01-12 DIAGNOSIS — E785 Hyperlipidemia, unspecified: Secondary | ICD-10-CM | POA: Diagnosis not present

## 2018-01-12 DIAGNOSIS — I10 Essential (primary) hypertension: Secondary | ICD-10-CM

## 2018-01-12 LAB — CBC
HCT: 41.9 % (ref 39.0–52.0)
Hemoglobin: 14.2 g/dL (ref 13.0–17.0)
MCHC: 34 g/dL (ref 30.0–36.0)
MCV: 90.2 fl (ref 78.0–100.0)
Platelets: 150 10*3/uL (ref 150.0–400.0)
RBC: 4.65 Mil/uL (ref 4.22–5.81)
RDW: 14.7 % (ref 11.5–15.5)
WBC: 5.6 10*3/uL (ref 4.0–10.5)

## 2018-01-12 LAB — COMPREHENSIVE METABOLIC PANEL
ALBUMIN: 3.8 g/dL (ref 3.5–5.2)
ALK PHOS: 67 U/L (ref 39–117)
ALT: 10 U/L (ref 0–53)
AST: 13 U/L (ref 0–37)
BILIRUBIN TOTAL: 0.7 mg/dL (ref 0.2–1.2)
BUN: 22 mg/dL (ref 6–23)
CALCIUM: 8.9 mg/dL (ref 8.4–10.5)
CHLORIDE: 106 meq/L (ref 96–112)
CO2: 29 mEq/L (ref 19–32)
CREATININE: 1.27 mg/dL (ref 0.40–1.50)
GFR: 57.95 mL/min — ABNORMAL LOW (ref >60.00)
Glucose, Bld: 105 mg/dL — ABNORMAL HIGH (ref 70–99)
Potassium: 4.5 mEq/L (ref 3.5–5.1)
Sodium: 142 mEq/L (ref 135–145)
TOTAL PROTEIN: 6.8 g/dL (ref 6.0–8.3)

## 2018-01-12 LAB — LIPID PANEL
CHOLESTEROL: 149 mg/dL (ref 0–200)
HDL: 32.1 mg/dL — ABNORMAL LOW (ref >39.00)
LDL Cholesterol: 98 mg/dL (ref 0–99)
NonHDL: 116.54
TRIGLYCERIDES: 93 mg/dL (ref 0.0–149.0)
Total CHOL/HDL Ratio: 5
VLDL: 18.6 mg/dL (ref 0.0–40.0)

## 2018-01-12 LAB — TSH: TSH: 1.66 u[IU]/mL (ref 0.35–4.50)

## 2018-01-19 ENCOUNTER — Telehealth: Payer: Self-pay | Admitting: Family Medicine

## 2018-01-19 ENCOUNTER — Encounter: Payer: Self-pay | Admitting: Family Medicine

## 2018-01-19 ENCOUNTER — Ambulatory Visit (INDEPENDENT_AMBULATORY_CARE_PROVIDER_SITE_OTHER): Payer: PPO | Admitting: Family Medicine

## 2018-01-19 VITALS — BP 130/70 | HR 96 | Temp 97.7°F | Resp 18 | Ht 72.0 in | Wt 217.6 lb

## 2018-01-19 DIAGNOSIS — M8589 Other specified disorders of bone density and structure, multiple sites: Secondary | ICD-10-CM

## 2018-01-19 DIAGNOSIS — I10 Essential (primary) hypertension: Secondary | ICD-10-CM | POA: Diagnosis not present

## 2018-01-19 DIAGNOSIS — Z0001 Encounter for general adult medical examination with abnormal findings: Secondary | ICD-10-CM | POA: Diagnosis not present

## 2018-01-19 DIAGNOSIS — Z0000 Encounter for general adult medical examination without abnormal findings: Secondary | ICD-10-CM

## 2018-01-19 DIAGNOSIS — E519 Thiamine deficiency, unspecified: Secondary | ICD-10-CM | POA: Diagnosis not present

## 2018-01-19 DIAGNOSIS — R739 Hyperglycemia, unspecified: Secondary | ICD-10-CM

## 2018-01-19 DIAGNOSIS — J069 Acute upper respiratory infection, unspecified: Secondary | ICD-10-CM

## 2018-01-19 DIAGNOSIS — G603 Idiopathic progressive neuropathy: Secondary | ICD-10-CM

## 2018-01-19 DIAGNOSIS — E785 Hyperlipidemia, unspecified: Secondary | ICD-10-CM | POA: Diagnosis not present

## 2018-01-19 DIAGNOSIS — S2241XS Multiple fractures of ribs, right side, sequela: Secondary | ICD-10-CM

## 2018-01-19 HISTORY — DX: Thiamine deficiency, unspecified: E51.9

## 2018-01-19 LAB — HEMOGLOBIN A1C: Hgb A1c MFr Bld: 5.7 % (ref 4.6–6.5)

## 2018-01-19 MED ORDER — CEFDINIR 300 MG PO CAPS: 300.0000 mg | ORAL_CAPSULE | Freq: Two times a day (BID) | ORAL | 0 refills | Status: DC

## 2018-01-19 MED ORDER — ALBUTEROL SULFATE HFA 108 (90 BASE) MCG/ACT IN AERS: 2.0000 | INHALATION_SPRAY | Freq: Four times a day (QID) | RESPIRATORY_TRACT | 0 refills | Status: DC | PRN

## 2018-01-19 MED ORDER — DOXYCYCLINE HYCLATE 100 MG PO TABS: 100.0000 mg | ORAL_TABLET | Freq: Two times a day (BID) | ORAL | 0 refills | Status: DC

## 2018-01-19 MED ORDER — DILTIAZEM HCL ER COATED BEADS 240 MG PO CP24: ORAL_CAPSULE | ORAL | 3 refills | Status: DC

## 2018-01-19 MED ORDER — GABAPENTIN 300 MG PO CAPS: 300.0000 mg | ORAL_CAPSULE | Freq: Every day | ORAL | 5 refills | Status: DC

## 2018-01-19 MED ORDER — METHYLPREDNISOLONE 4 MG PO TABS: ORAL_TABLET | ORAL | 0 refills | Status: DC

## 2018-01-19 MED ORDER — FLUTICASONE PROPIONATE 50 MCG/ACT NA SUSP: 2.0000 | Freq: Every day | NASAL | 1 refills | Status: DC

## 2018-01-19 NOTE — Telephone Encounter (Signed)
OK d/c Doxy and start Cefdinir 300 mg po bid x 10 days

## 2018-01-19 NOTE — Telephone Encounter (Signed)
Please advise 

## 2018-01-19 NOTE — Patient Instructions (Addendum)
Encouraged increased rest and hydration, add probiotics, zinc such as Coldeze or Xicam. Treat fevers as needed. Vitamin C 500 mg, Elderberry liquid, Aged or black garlic, Mucinex twice daily  Preventive Care 22 Years and Older, Male Preventive care refers to lifestyle choices and visits with your health care provider that can promote health and wellness. What does preventive care include?  A yearly physical exam. This is also called an annual well check.  Dental exams once or twice a year.  Routine eye exams. Ask your health care provider how often you should have your eyes checked.  Personal lifestyle choices, including: ? Daily care of your teeth and gums. ? Regular physical activity. ? Eating a healthy diet. ? Avoiding tobacco and drug use. ? Limiting alcohol use. ? Practicing safe sex. ? Taking low doses of aspirin every day. ? Taking vitamin and mineral supplements as recommended by your health care provider. What happens during an annual well check? The services and screenings done by your health care provider during your annual well check will depend on your age, overall health, lifestyle risk factors, and family history of disease. Counseling Your health care provider may ask you questions about your:  Alcohol use.  Tobacco use.  Drug use.  Emotional well-being.  Home and relationship well-being.  Sexual activity.  Eating habits.  History of falls.  Memory and ability to understand (cognition).  Work and work Statistician.  Screening You may have the following tests or measurements:  Height, weight, and BMI.  Blood pressure.  Lipid and cholesterol levels. These may be checked every 5 years, or more frequently if you are over 5 years old.  Skin check.  Lung cancer screening. You may have this screening every year starting at age 27 if you have a 30-pack-year history of smoking and currently smoke or have quit within the past 15 years.  Fecal occult  blood test (FOBT) of the stool. You may have this test every year starting at age 50.  Flexible sigmoidoscopy or colonoscopy. You may have a sigmoidoscopy every 5 years or a colonoscopy every 10 years starting at age 37.  Prostate cancer screening. Recommendations will vary depending on your family history and other risks.  Hepatitis C blood test.  Hepatitis B blood test.  Sexually transmitted disease (STD) testing.  Diabetes screening. This is done by checking your blood sugar (glucose) after you have not eaten for a while (fasting). You may have this done every 1-3 years.  Abdominal aortic aneurysm (AAA) screening. You may need this if you are a current or former smoker.  Osteoporosis. You may be screened starting at age 31 if you are at high risk.  Talk with your health care provider about your test results, treatment options, and if necessary, the need for more tests. Vaccines Your health care provider may recommend certain vaccines, such as:  Influenza vaccine. This is recommended every year.  Tetanus, diphtheria, and acellular pertussis (Tdap, Td) vaccine. You may need a Td booster every 10 years.  Varicella vaccine. You may need this if you have not been vaccinated.  Zoster vaccine. You may need this after age 18.  Measles, mumps, and rubella (MMR) vaccine. You may need at least one dose of MMR if you were born in 1957 or later. You may also need a second dose.  Pneumococcal 13-valent conjugate (PCV13) vaccine. One dose is recommended after age 22.  Pneumococcal polysaccharide (PPSV23) vaccine. One dose is recommended after age 90.  Meningococcal vaccine. You  may need this if you have certain conditions.  Hepatitis A vaccine. You may need this if you have certain conditions or if you travel or work in places where you may be exposed to hepatitis A.  Hepatitis B vaccine. You may need this if you have certain conditions or if you travel or work in places where you may be  exposed to hepatitis B.  Haemophilus influenzae type b (Hib) vaccine. You may need this if you have certain risk factors.  Talk to your health care provider about which screenings and vaccines you need and how often you need them. This information is not intended to replace advice given to you by your health care provider. Make sure you discuss any questions you have with your health care provider. Document Released: 09/18/2015 Document Revised: 05/11/2016 Document Reviewed: 06/23/2015 Elsevier Interactive Patient Education  2018 [employer]  

## 2018-01-19 NOTE — Assessment & Plan Note (Signed)
Encouraged heart healthy diet, increase exercise, avoid trans fats, consider a krill oil cap daily 

## 2018-01-19 NOTE — Telephone Encounter (Signed)
Copied from [place] [id number]. Topic: Quick Communication - See Telephone Encounter >> Jan 19, 2018  8:54 AM Synthia Innocent wrote: CRM for notification. See Telephone encounter for: 01/19/18.Pharmacy calling, allergic to yellow dye in tablet of doxycycline (VIBRA-TABS) 100 MG tablet, ok to switch to capsule?

## 2018-01-19 NOTE — Progress Notes (Signed)
Subjective:  I acted as a Education administrator for Dr. Charlett Blake. [place], [place]  Patient ID: Brian Davidson, male    DOB: October 16, 1935, 81 y.o.   MRN: [id number]  Chief Complaint  Patient presents with  . Annual Exam    HPI  Patient is in today for an annual exam and follow up on dyslipidemia, CAD, Thiamine deficiency, allergies and more. He has been struggling with chills, HA, myalgias, fatigue and a productive cough. He has been sick for about a week. He notes some relief with Tylenol but then symptoms return. Denies CP/palp/SOB/fevers/GI or GU c/o. Taking meds as prescribed  Patient Care Team: Mosie Lukes, MD as PCP - General (Family Medicine) Thompson Grayer, MD as Consulting Physician (Cardiology) Franchot Mimes, MD as Consulting Physician (Family Medicine) Armbruster, Carlota Raspberry, MD as Consulting Physician (Gastroenterology) Thompson Grayer, MD as Consulting Physician (Cardiology)   Past Medical History:  Diagnosis Date  . Arthritis   . Biliary dyskinesia   . Carotid artery disease ([place]) 07/01/2016  . Dyslipidemia 07/13/2017  . GERD (gastroesophageal reflux disease)   . H/O measles   . History of chicken pox   . Hypertension   . Kidney stone 03/2015  . Persistent atrial fibrillation ([place])   . Pneumonia 1992  . Tachycardia-bradycardia ([place])    a. s/p STJ dual chamber pacemaker  . Thiamine deficiency 01/19/2018  . Valvular heart disease 07/01/2016    Past Surgical History:  Procedure Laterality Date  . CHOLECYSTECTOMY N/A 11/24/2015   Procedure: LAPAROSCOPIC CHOLECYSTECTOMY;  Surgeon: Greer Pickerel, MD;  Location: [facility];  Service: General;  Laterality: N/A;  . CYSTOSCOPY  2016  . FRACTURE SURGERY  2005   left ankle  . HERNIA REPAIR     left groin, no mesh  . PERMANENT PACEMAKER INSERTION N/A 01/31/2012   SJM Accent SR RF implanted by DR Allred    Family History  Problem Relation Age of Onset  . Hypertension Mother   . Arthritis Mother   . Drug abuse Mother   . Stroke Father     . Heart disease Father   . Heart disease Sister 57  . Heart disease Brother   . Hypertension Son   . Heart disease Sister   . Neuropathy Brother   . Hypertension Brother   . Atrial fibrillation Brother   . Atrial fibrillation Sister   . Colon cancer Neg Hx     Social History   Socioeconomic History  . Marital status: Married    Spouse name: [name]  . Number of children: 1  . Years of education: Not on file  . Highest education level: Not on file  Occupational History  . Occupation: Retired  . Occupation: Dance movement psychotherapist for [employer]  . Financial resource strain: Not on file  . Food insecurity:    Worry: Not on file    Inability: Not on file  . Transportation needs:    Medical: Not on file    Non-medical: Not on file  Tobacco Use  . Smoking status: Former Smoker    Years: 10.00    Types: Pipe, Cigars    Last attempt to quit: 11/18/1985    Years since quitting: 32.1  . Smokeless tobacco: Never Used  Substance and Sexual Activity  . Alcohol use: Yes    Alcohol/week: 1.8 oz    Types: 3 Cans of beer per week    Comment: occ  . Drug use: No  . Sexual activity: Never  Lifestyle  . Physical activity:    Days per week: Not on file    Minutes per session: Not on file  . Stress: Not on file  Relationships  . Social connections:    Talks on phone: Not on file    Gets together: Not on file    Attends religious service: Not on file    Active member of club or organization: Not on file    Attends meetings of clubs or organizations: Not on file    Relationship status: Not on file  . Intimate partner violence:    Fear of current or ex partner: Not on file    Emotionally abused: Not on file    Physically abused: Not on file    Forced sexual activity: Not on file  Other Topics Concern  . Not on file  Social History Narrative   Lives with wife, still works part-time as a Geophysicist/field seismologist. He is active around the house and yard...   Parents are deceased and did  not have cardiac issues but he has 2 siblings with atrial fibrillation and a sister-in-law  has a pacemaker.    Outpatient Medications Prior to Visit  Medication Sig Dispense Refill  . diclofenac sodium (VOLTAREN) 1 % GEL Apply 4 g topically 4 (four) times daily. 100 g 0  . irbesartan-hydrochlorothiazide (AVALIDE) 150-12.5 MG tablet TAKE 1/2 (ONE-HALF) TABLET BY MOUTH ONCE DAILY 90 tablet 0  . loratadine (CLARITIN) 10 MG tablet Take 10 mg by mouth daily as needed. Reported on 12/31/2015    . metoprolol tartrate (LOPRESSOR) 100 MG tablet TAKE ONE TABLET BY MOUTH TWICE DAILY 180 tablet 3  . XARELTO 20 MG TABS tablet TAKE ONE TABLET BY MOUTH ONCE DAILY 90 tablet 3  . diltiazem (CARDIZEM CD) 240 MG 24 hr capsule TAKE ONE (1) CAPSULE EACH DAY 30 capsule 6  . fluticasone (FLONASE) 50 MCG/ACT nasal spray Place 2 sprays into both nostrils daily. 16 g 1  . gabapentin (NEURONTIN) 300 MG capsule TAKE 1 CAPSULE BY MOUTH ONCE DAILY AT BEDTIME 30 capsule 0  . traMADol (ULTRAM) 50 MG tablet Take 1 tablet (50 mg total) by mouth every 8 (eight) hours as needed. 60 tablet 0   No facility-administered medications prior to visit.     Allergies  Allergen Reactions  . Cardizem [Diltiazem]     Rash from Yellow dye in generic capsule. Can take different brand. Takes diltiazem - his allergy is to Cardizem  . Sulfa Antibiotics     Unknown childhood reaction    Review of Systems  Constitutional: Positive for chills and malaise/fatigue. Negative for fever.  HENT: Positive for congestion. Negative for hearing loss.   Eyes: Negative for discharge.  Respiratory: Positive for cough, sputum production and wheezing. Negative for shortness of breath.   Cardiovascular: Negative for chest pain, palpitations and leg swelling.  Gastrointestinal: Negative for abdominal pain, blood in stool, constipation, diarrhea, heartburn, nausea and vomiting.  Genitourinary: Negative for dysuria, frequency, hematuria and urgency.    Musculoskeletal: Negative for back pain, falls and myalgias.  Skin: Negative for rash.  Neurological: Negative for dizziness, sensory change, loss of consciousness, weakness and headaches.  Endo/Heme/Allergies: Negative for environmental allergies. Does not bruise/bleed easily.  Psychiatric/Behavioral: Negative for depression and suicidal ideas. The patient is not nervous/anxious and does not have insomnia.        Objective:    Physical Exam  Constitutional: He is oriented to person, place, and time. No distress.  HENT:  Head: Normocephalic  and atraumatic.  Eyes: Conjunctivae are normal.  Neck: Neck supple. No thyromegaly present.  Cardiovascular: Normal rate, regular rhythm and normal heart sounds.  No murmur heard. Pulmonary/Chest: Effort normal and breath sounds normal. No respiratory distress.  Abdominal: He exhibits no distension and no mass. There is no tenderness.  Musculoskeletal: He exhibits no edema.  Neurological: He is alert and oriented to person, place, and time.  Skin: Skin is warm.  Psychiatric: Judgment normal.    BP 130/70 (BP Location: Left Arm, Patient Position: Sitting, Cuff Size: Large)   Pulse 96   Temp 97.7 F (36.5 C) (Oral)   Resp 18   Ht 6' (1.829 m)   Wt 217 lb 9.6 oz (98.7 kg)   SpO2 98%   BMI 29.51 kg/m  Wt Readings from Last 3 Encounters:  01/19/18 217 lb 9.6 oz (98.7 kg)  11/01/17 228 lb 6.4 oz (103.6 kg)  10/27/17 224 lb 6.4 oz (101.8 kg)   BP Readings from Last 3 Encounters:  01/19/18 130/70  11/01/17 123/66  10/27/17 (!) 142/82     Immunization History  Administered Date(s) Administered  . Influenza, High Dose Seasonal PF 06/18/2017  . Influenza-Unspecified 06/11/2016  . Pneumococcal Conjugate-13 07/20/2015  . Pneumococcal Polysaccharide-23 09/05/2016  . Tdap 11/14/2014  . Zoster Recombinat (Shingrix) 12/26/2016, 03/21/2017    Health Maintenance  Topic Date Due  . INFLUENZA VACCINE  04/05/2018  . TETANUS/TDAP  11/13/2024   . PNA vac Low Risk Adult  Completed    Lab Results  Component Value Date   WBC 5.6 01/12/2018   HGB 14.2 01/12/2018   HCT 41.9 01/12/2018   PLT 150.0 01/12/2018   GLUCOSE 105 (H) 01/12/2018   CHOL 149 01/12/2018   TRIG 93.0 01/12/2018   HDL 32.10 (L) 01/12/2018   LDLCALC 98 01/12/2018   ALT 10 01/12/2018   AST 13 01/12/2018   NA 142 01/12/2018   K 4.5 01/12/2018   CL 106 01/12/2018   CREATININE 1.27 01/12/2018   BUN 22 01/12/2018   CO2 29 01/12/2018   TSH 1.66 01/12/2018   INR 1.20 11/24/2015   HGBA1C 5.7 01/19/2018    Lab Results  Component Value Date   TSH 1.66 01/12/2018   Lab Results  Component Value Date   WBC 5.6 01/12/2018   HGB 14.2 01/12/2018   HCT 41.9 01/12/2018   MCV 90.2 01/12/2018   PLT 150.0 01/12/2018   Lab Results  Component Value Date   NA 142 01/12/2018   K 4.5 01/12/2018   CO2 29 01/12/2018   GLUCOSE 105 (H) 01/12/2018   BUN 22 01/12/2018   CREATININE 1.27 01/12/2018   BILITOT 0.7 01/12/2018   ALKPHOS 67 01/12/2018   AST 13 01/12/2018   ALT 10 01/12/2018   PROT 6.8 01/12/2018   ALBUMIN 3.8 01/12/2018   CALCIUM 8.9 01/12/2018   ANIONGAP 8 11/19/2015   GFR 57.95 (L) 01/12/2018   Lab Results  Component Value Date   CHOL 149 01/12/2018   Lab Results  Component Value Date   HDL 32.10 (L) 01/12/2018   Lab Results  Component Value Date   LDLCALC 98 01/12/2018   Lab Results  Component Value Date   TRIG 93.0 01/12/2018   Lab Results  Component Value Date   CHOLHDL 5 01/12/2018   Lab Results  Component Value Date   HGBA1C 5.7 01/19/2018         Assessment & Plan:   Problem List Items Addressed This Visit    Hypertension  Well controlled, no changes to meds. Encouraged heart healthy diet such as the DASH diet and exercise as tolerated.       Relevant Medications   diltiazem (CARDIZEM CD) 240 MG 24 hr capsule   Other Relevant Orders   CBC   Comprehensive metabolic panel   TSH   Osteopenia   Relevant Orders    DG Bone Density   Preventative health care    Patient encouraged to maintain heart healthy diet, regular exercise, adequate sleep. Consider daily probiotics. Take medications as prescribed      Peripheral neuropathy   Relevant Medications   gabapentin (NEURONTIN) 300 MG capsule   Dyslipidemia    Encouraged heart healthy diet, increase exercise, avoid trans fats, consider a krill oil cap daily      Relevant Orders   Lipid panel   Thiamine deficiency    Check level today continue supplements      Relevant Orders   Vitamin B1   Vitamin B1   Multiple closed fractures of ribs of right side    He suffered a fall and brok 2 ribs over the winter but is now feeling much better.       Relevant Orders   DG Bone Density   URI (upper respiratory infection)    Encouraged increased rest and hydration, add probiotics, zinc such as Coldeze or Xicam. Treat fevers as needed       Other Visit Diagnoses    Hyperglycemia    -  Primary   Relevant Orders   Hemoglobin A1c (Completed)   Hemoglobin A1c      I have discontinued Brian Davidson's traMADol. I have also changed his gabapentin. Additionally, I am having him start on albuterol and methylPREDNISolone. Lastly, I am having him maintain his loratadine, metoprolol tartrate, XARELTO, irbesartan-hydrochlorothiazide, diclofenac sodium, fluticasone, and diltiazem.  Meds ordered this encounter  Medications  . fluticasone (FLONASE) 50 MCG/ACT nasal spray    Sig: Place 2 sprays into both nostrils daily.    Dispense:  16 g    Refill:  1  . diltiazem (CARDIZEM CD) 240 MG 24 hr capsule    Sig: TAKE ONE (1) CAPSULE EACH DAY    Dispense:  90 capsule    Refill:  3  . albuterol (PROVENTIL HFA;VENTOLIN HFA) 108 (90 Base) MCG/ACT inhaler    Sig: Inhale 2 puffs into the lungs every 6 (six) hours as needed for wheezing or shortness of breath.    Dispense:  1 Inhaler    Refill:  0  . methylPREDNISolone (MEDROL) 4 MG tablet    Sig: 5 tab po qd X  1d then 4 tab po qd X 1d then 3 tab po qd X 1d then 2 tab po qd then 1 tab po qd    Dispense:  15 tablet    Refill:  0  . DISCONTD: doxycycline (VIBRA-TABS) 100 MG tablet    Sig: Take 1 tablet (100 mg total) by mouth 2 (two) times daily.    Dispense:  20 tablet    Refill:  0  . gabapentin (NEURONTIN) 300 MG capsule    Sig: Take 1-2 capsules (300-600 mg total) by mouth at bedtime.    Dispense:  60 capsule    Refill:  5    Please consider 90 day supplies to promote better adherence    CMA served as scribe during this visit. History, Physical and Plan performed by medical provider. Documentation and orders reviewed and attested to.  [name], MD

## 2018-01-19 NOTE — Assessment & Plan Note (Addendum)
Well controlled, no changes to meds. Encouraged heart healthy diet such as the DASH diet and exercise as tolerated.  °

## 2018-01-19 NOTE — Assessment & Plan Note (Signed)
Check level today continue supplements

## 2018-01-19 NOTE — Telephone Encounter (Signed)
rx sent to pharmacy

## 2018-01-21 DIAGNOSIS — J069 Acute upper respiratory infection, unspecified: Secondary | ICD-10-CM | POA: Insufficient documentation

## 2018-01-21 DIAGNOSIS — S2241XA Multiple fractures of ribs, right side, initial encounter for closed fracture: Secondary | ICD-10-CM | POA: Insufficient documentation

## 2018-01-21 NOTE — Assessment & Plan Note (Signed)
He suffered a fall and brok 2 ribs over the winter but is now feeling much better.

## 2018-01-21 NOTE — Assessment & Plan Note (Signed)
Encouraged increased rest and hydration, add probiotics, zinc such as Coldeze or Xicam. Treat fevers as needed 

## 2018-01-21 NOTE — Assessment & Plan Note (Signed)
Patient encouraged to maintain heart healthy diet, regular exercise, adequate sleep. Consider daily probiotics. Take medications as prescribed 

## 2018-01-22 LAB — VITAMIN B1: Vitamin B1 (Thiamine): 76 nmol/L — ABNORMAL HIGH (ref 8–30)

## 2018-01-25 ENCOUNTER — Telehealth: Payer: Self-pay | Admitting: Family Medicine

## 2018-01-25 ENCOUNTER — Ambulatory Visit (HOSPITAL_BASED_OUTPATIENT_CLINIC_OR_DEPARTMENT_OTHER)
Admission: RE | Admit: 2018-01-25 | Discharge: 2018-01-25 | Disposition: A | Discharge status: Home / Self Care | Payer: PPO | Source: Ambulatory Visit | Attending: Family Medicine | Admitting: Family Medicine

## 2018-01-25 ENCOUNTER — Other Ambulatory Visit: Payer: Self-pay

## 2018-01-25 DIAGNOSIS — S2241XS Multiple fractures of ribs, right side, sequela: Secondary | ICD-10-CM | POA: Diagnosis not present

## 2018-01-25 DIAGNOSIS — X58XXXS Exposure to other specified factors, sequela: Secondary | ICD-10-CM | POA: Insufficient documentation

## 2018-01-25 DIAGNOSIS — M85851 Other specified disorders of bone density and structure, right thigh: Secondary | ICD-10-CM | POA: Diagnosis not present

## 2018-01-25 DIAGNOSIS — M8589 Other specified disorders of bone density and structure, multiple sites: Secondary | ICD-10-CM | POA: Insufficient documentation

## 2018-01-25 MED ORDER — GABAPENTIN 300 MG PO CAPS: 300.0000 mg | ORAL_CAPSULE | Freq: Every day | ORAL | 5 refills | Status: DC

## 2018-01-25 NOTE — Telephone Encounter (Signed)
Patient walked in and stts that the doubling up on the gabapentin is working, and would like a RX for 600mg .   Please adv patient once rx has been sent to pharmacy.

## 2018-01-25 NOTE — Telephone Encounter (Signed)
Spoke with pt Rx sent

## 2018-02-12 ENCOUNTER — Ambulatory Visit (HOSPITAL_COMMUNITY)
Admission: RE | Admit: 2018-02-12 | Discharge: 2018-02-12 | Disposition: A | Discharge status: Home / Self Care | Payer: PPO | Source: Ambulatory Visit | Attending: Cardiovascular Disease | Admitting: Cardiovascular Disease

## 2018-02-12 ENCOUNTER — Other Ambulatory Visit: Payer: Self-pay

## 2018-02-12 ENCOUNTER — Ambulatory Visit (HOSPITAL_BASED_OUTPATIENT_CLINIC_OR_DEPARTMENT_OTHER): Payer: PPO

## 2018-02-12 DIAGNOSIS — I10 Essential (primary) hypertension: Secondary | ICD-10-CM | POA: Insufficient documentation

## 2018-02-12 DIAGNOSIS — I482 Chronic atrial fibrillation: Secondary | ICD-10-CM

## 2018-02-12 DIAGNOSIS — I083 Combined rheumatic disorders of mitral, aortic and tricuspid valves: Secondary | ICD-10-CM | POA: Insufficient documentation

## 2018-02-12 DIAGNOSIS — I779 Disorder of arteries and arterioles, unspecified: Secondary | ICD-10-CM | POA: Diagnosis not present

## 2018-02-12 DIAGNOSIS — I495 Sick sinus syndrome: Secondary | ICD-10-CM | POA: Insufficient documentation

## 2018-02-12 DIAGNOSIS — I4821 Permanent atrial fibrillation: Secondary | ICD-10-CM

## 2018-02-12 DIAGNOSIS — I739 Peripheral vascular disease, unspecified: Secondary | ICD-10-CM

## 2018-02-12 DIAGNOSIS — E785 Hyperlipidemia, unspecified: Secondary | ICD-10-CM | POA: Diagnosis not present

## 2018-02-12 MED ORDER — PERFLUTREN LIPID MICROSPHERE
1.0000 mL | INTRAVENOUS | Status: AC | PRN
  Administered 2018-02-12: 1 mL via INTRAVENOUS

## 2018-03-02 ENCOUNTER — Encounter: Payer: Self-pay | Admitting: Internal Medicine

## 2018-03-05 ENCOUNTER — Ambulatory Visit (INDEPENDENT_AMBULATORY_CARE_PROVIDER_SITE_OTHER): Payer: PPO | Admitting: *Deleted

## 2018-03-05 DIAGNOSIS — I495 Sick sinus syndrome: Secondary | ICD-10-CM | POA: Diagnosis not present

## 2018-03-05 LAB — CUP PACEART REMOTE DEVICE CHECK
Battery Remaining Percentage: 95.5 %
Battery Voltage: 2.96 V
Date Time Interrogation Session: 20190701074225
Implantable Lead Implant Date: 20130528
Lead Channel Impedance Value: 590 Ohm
Lead Channel Pacing Threshold Amplitude: 0.5 V
MDC IDC LEAD LOCATION: 753860
MDC IDC MSMT BATTERY REMAINING LONGEVITY: 134 mo
MDC IDC MSMT LEADCHNL RV PACING THRESHOLD PULSEWIDTH: 0.4 ms
MDC IDC MSMT LEADCHNL RV SENSING INTR AMPL: 12 mV
MDC IDC PG IMPLANT DT: 20130528
MDC IDC SET LEADCHNL RV PACING AMPLITUDE: 2.5 V
MDC IDC SET LEADCHNL RV PACING PULSEWIDTH: 0.4 ms
MDC IDC SET LEADCHNL RV SENSING SENSITIVITY: 2 mV
MDC IDC STAT BRADY RV PERCENT PACED: 10 %
Pulse Gen Model: 1210
Pulse Gen Serial Number: 7328888

## 2018-03-05 NOTE — Progress Notes (Signed)
Remote pacemaker transmission.   

## 2018-03-12 ENCOUNTER — Encounter: Payer: Self-pay | Admitting: Internal Medicine

## 2018-03-12 ENCOUNTER — Ambulatory Visit: Payer: PPO | Admitting: Internal Medicine

## 2018-03-12 VITALS — BP 124/64 | HR 83 | Ht 72.0 in | Wt 222.0 lb

## 2018-03-12 DIAGNOSIS — Z950 Presence of cardiac pacemaker: Secondary | ICD-10-CM | POA: Diagnosis not present

## 2018-03-12 DIAGNOSIS — I495 Sick sinus syndrome: Secondary | ICD-10-CM

## 2018-03-12 DIAGNOSIS — I482 Chronic atrial fibrillation: Secondary | ICD-10-CM

## 2018-03-12 DIAGNOSIS — I4821 Permanent atrial fibrillation: Secondary | ICD-10-CM

## 2018-03-12 NOTE — Patient Instructions (Signed)
Medication Instructions:  Your physician recommends that you continue on your current medications as directed. Please refer to the Current Medication list given to you today.  Labwork: None ordered.  Testing/Procedures: None ordered.  Follow-Up: Your physician wants you to follow-up in: one year with Tommye Standard, PA.   You will receive a reminder letter in the mail two months in advance. If you don't receive a letter, please call our office to schedule the follow-up appointment.  Remote monitoring is used to monitor your Pacemaker from home. This monitoring reduces the number of office visits required to check your device to one time per year. It allows [place] to keep an eye on the functioning of your device to ensure it is working properly. You are scheduled for a device check from home on 06/04/2018. You may send your transmission at any time that day. If you have a wireless device, the transmission will be sent automatically. After your physician reviews your transmission, you will receive a postcard with your next transmission date.  Any Other Special Instructions Will Be Listed Below (If Applicable).  If you need a refill on your cardiac medications before your next appointment, please call your pharmacy.

## 2018-03-12 NOTE — Progress Notes (Signed)
PCP: Mosie Lukes, MD Primary Cardiologist:  Dr Claudie Leach previously (unable to return to due insurance concerns). Primary EP:  Dr Lisa Roca is a 82 y.o. male who presents today for routine electrophysiology followup.  Since last being seen in our clinic, the patient reports doing very well.  Today, he denies symptoms of palpitations, chest pain, shortness of breath,  lower extremity edema, dizziness, presyncope, or syncope.  The patient is otherwise without complaint today.   Past Medical History:  Diagnosis Date  . Arthritis   . Biliary dyskinesia   . Carotid artery disease ([place]) 07/01/2016  . Dyslipidemia 07/13/2017  . GERD (gastroesophageal reflux disease)   . H/O measles   . History of chicken pox   . Hypertension   . Kidney stone 03/2015  . Persistent atrial fibrillation ([place])   . Pneumonia 1992  . Tachycardia-bradycardia ([place])    a. s/p STJ dual chamber pacemaker  . Thiamine deficiency 01/19/2018  . Valvular heart disease 07/01/2016   Past Surgical History:  Procedure Laterality Date  . CHOLECYSTECTOMY N/A 11/24/2015   Procedure: LAPAROSCOPIC CHOLECYSTECTOMY;  Surgeon: Greer Pickerel, MD;  Location: [facility];  Service: General;  Laterality: N/A;  . CYSTOSCOPY  2016  . FRACTURE SURGERY  2005   left ankle  . HERNIA REPAIR     left groin, no mesh  . PERMANENT PACEMAKER INSERTION N/A 01/31/2012   SJM Accent SR RF implanted by DR Padraic Marinos    ROS- all systems are reviewed and negative except as per HPI above  Current Outpatient Medications  Medication Sig Dispense Refill  . albuterol (PROVENTIL HFA;VENTOLIN HFA) 108 (90 Base) MCG/ACT inhaler Inhale 2 puffs into the lungs every 6 (six) hours as needed for wheezing or shortness of breath. 1 Inhaler 0  . cefdinir (OMNICEF) 300 MG capsule Take 1 capsule (300 mg total) by mouth 2 (two) times daily. 20 capsule 0  . diclofenac sodium (VOLTAREN) 1 % GEL Apply 4 g topically 4 (four) times daily. 100 g 0  . diltiazem  (CARDIZEM CD) 240 MG 24 hr capsule TAKE ONE (1) CAPSULE EACH DAY 90 capsule 3  . fluticasone (FLONASE) 50 MCG/ACT nasal spray Place 2 sprays into both nostrils daily. 16 g 1  . gabapentin (NEURONTIN) 300 MG capsule Take 1-2 capsules (300-600 mg total) by mouth at bedtime. 60 capsule 5  . irbesartan-hydrochlorothiazide (AVALIDE) 150-12.5 MG tablet TAKE 1/2 (ONE-HALF) TABLET BY MOUTH ONCE DAILY 90 tablet 0  . loratadine (CLARITIN) 10 MG tablet Take 10 mg by mouth daily as needed. Reported on 12/31/2015    . methylPREDNISolone (MEDROL) 4 MG tablet 5 tab po qd X 1d then 4 tab po qd X 1d then 3 tab po qd X 1d then 2 tab po qd then 1 tab po qd 15 tablet 0  . metoprolol tartrate (LOPRESSOR) 100 MG tablet TAKE ONE TABLET BY MOUTH TWICE DAILY 180 tablet 3  . Turmeric 500 MG TABS Take by mouth.    Alveda Reasons 20 MG TABS tablet TAKE ONE TABLET BY MOUTH ONCE DAILY 90 tablet 3   No current facility-administered medications for this visit.     Physical Exam: Vitals:   03/12/18 1046  BP: 124/64  Pulse: 83  Weight: 222 lb (100.7 kg)  Height: 6' (1.829 m)    GEN- The patient is well appearing, alert and oriented x 3 today.   Head- normocephalic, atraumatic Eyes-  Sclera clear, conjunctiva pink Ears- hearing intact Oropharynx- clear Lungs-  Clear to ausculation bilaterally, normal work of breathing Chest- pacemaker pocket is well healed Heart- Regular rate and rhythm, no murmurs, rubs or gallops, PMI not laterally displaced GI- soft, NT, ND, + BS Extremities- no clubbing, cyanosis, or edema  Pacemaker interrogation- reviewed in detail today,  See PACEART report  ekg tracing ordered today is personally reviewed and shows afib with demand V pacing  Assessment and Plan:  1. Symptomatic bradycardia  Normal pacemaker function See Pace Art report No changes today  2. Permanent afib Well rate controlled chads2vasc score is 4.  Continue anticoagulation  3. HTN Stable No change required  today  4. CAD Repeat dopplers from 02/12/18 reviewed Given advanced age, would not plan to repeat in a year  5. Valvular heart disease Echo 02/12/18 reviewed with patient Stable valvular heart disease, mild AI, moderate MR Repeat echo in 2 years or if symptoms occur  [place] Return to see EP PA in a year  Thompson Grayer MD, [facility] 03/12/2018 11:31 AM

## 2018-03-21 ENCOUNTER — Other Ambulatory Visit: Payer: Self-pay | Admitting: Family Medicine

## 2018-03-24 LAB — CUP PACEART INCLINIC DEVICE CHECK
Battery Remaining Longevity: 135 mo
Date Time Interrogation Session: 20190708150726
Implantable Lead Implant Date: 20130528
Implantable Lead Model: 1948
Implantable Pulse Generator Implant Date: 20130528
Lead Channel Pacing Threshold Amplitude: 0.75 V
Lead Channel Pacing Threshold Pulse Width: 0.4 ms
Lead Channel Setting Pacing Pulse Width: 0.4 ms
Lead Channel Setting Sensing Sensitivity: 2 mV
MDC IDC LEAD LOCATION: 753860
MDC IDC MSMT BATTERY VOLTAGE: 2.96 V
MDC IDC MSMT LEADCHNL RV IMPEDANCE VALUE: 650 Ohm
MDC IDC MSMT LEADCHNL RV SENSING INTR AMPL: 12 mV
MDC IDC PG SERIAL: 7328888
MDC IDC SET LEADCHNL RV PACING AMPLITUDE: 2.5 V
MDC IDC STAT BRADY RV PERCENT PACED: 10 %

## 2018-04-17 ENCOUNTER — Other Ambulatory Visit: Payer: Self-pay | Admitting: *Deleted

## 2018-04-17 MED ORDER — IRBESARTAN-HYDROCHLOROTHIAZIDE 150-12.5 MG PO TABS: ORAL_TABLET | ORAL | 1 refills | Status: DC

## 2018-04-18 ENCOUNTER — Encounter: Payer: Self-pay | Admitting: Family Medicine

## 2018-05-17 ENCOUNTER — Ambulatory Visit: Payer: PPO | Admitting: Sports Medicine

## 2018-05-23 ENCOUNTER — Ambulatory Visit (INDEPENDENT_AMBULATORY_CARE_PROVIDER_SITE_OTHER): Payer: PPO | Admitting: Sports Medicine

## 2018-05-23 ENCOUNTER — Ambulatory Visit (INDEPENDENT_AMBULATORY_CARE_PROVIDER_SITE_OTHER): Payer: PPO

## 2018-05-23 ENCOUNTER — Encounter: Payer: Self-pay | Admitting: Sports Medicine

## 2018-05-23 VITALS — BP 132/74 | HR 66 | Ht 72.0 in | Wt 223.8 lb

## 2018-05-23 DIAGNOSIS — M479 Spondylosis, unspecified: Secondary | ICD-10-CM

## 2018-05-23 DIAGNOSIS — G8929 Other chronic pain: Secondary | ICD-10-CM | POA: Diagnosis not present

## 2018-05-23 DIAGNOSIS — M9903 Segmental and somatic dysfunction of lumbar region: Secondary | ICD-10-CM

## 2018-05-23 DIAGNOSIS — M5136 Other intervertebral disc degeneration, lumbar region: Secondary | ICD-10-CM | POA: Diagnosis not present

## 2018-05-23 DIAGNOSIS — M9904 Segmental and somatic dysfunction of sacral region: Secondary | ICD-10-CM | POA: Diagnosis not present

## 2018-05-23 DIAGNOSIS — M545 Low back pain: Secondary | ICD-10-CM | POA: Diagnosis not present

## 2018-05-23 DIAGNOSIS — M9905 Segmental and somatic dysfunction of pelvic region: Secondary | ICD-10-CM

## 2018-05-23 NOTE — Progress Notes (Signed)
PROCEDURE NOTE : OSTEOPATHIC MANIPULATION The decision today to treat with Osteopathic Manipulative Therapy (OMT) was based on physical exam findings. Verbal consent was obtained following a discussion with the patient regarding the of risks, benefits and potential side effects, including an acute pain flare,post manipulation soreness and need for repeat treatments.     Contraindications to OMT: NONE  Manipulation was performed as below: Regions Treated OMT Techniques Used  Lumbar spine Pelvis Sacrum HVLA muscle energy myofascial release   The patient tolerated the treatment well and reported Improved symptoms following treatment today. Patient was given medications, exercises, stretches and lifestyle modifications per AVS and verbally.   OSTEOPATHIC/STRUCTURAL EXAM:   L3 FRS right (Flexed, Rotated & Sidebent) Right psoas spasm Right anterior innonimate L on L sacral torsion

## 2018-05-23 NOTE — Progress Notes (Signed)
PROCEDURE NOTE: THERAPEUTIC EXERCISES (97110) 15 minutes spent for Therapeutic exercises as below and as referenced in the AVS.  This included exercises focusing on stretching, strengthening, with significant focus on eccentric aspects.   Proper technique shown and discussed handout in great detail with ATC.  All questions were discussed and answered.   Long term goals include an improvement in range of motion, strength, endurance as well as avoiding reinjury. Frequency of visits is one time as determined during today's  office visit. Frequency of exercises to be performed is as per handout.  EXERCISES REVIEWED:  Brian Davidson Exercises  Cervical Towel Stretching Exercises  Pelvic recruitment

## 2018-05-23 NOTE — Patient Instructions (Addendum)
Also check out [employer]" which is a program developed by Dr. Minerva Ends.   There are links to a couple of his YouTube Videos below and I would like to see performing one of his videos 5-6 days per week.    A good intro video is: "Independence from Pain 7-minute Video" - [email or web address]   Exercises that focus more on the neck are as below: Dr. Archie Balboa with [place] teaching neck and shoulder details Part 1 - https://[email or web address].[email or web address] Part 2 Dr. Archie Balboa with [facility] quick routine to practice daily - https://[email or web address].be/[email or web address]  Do not try to attempt the entire video when first beginning.    Try breaking of each exercise that he goes into shorter segments.  Otherwise if they perform an exercise for 45 seconds, start with 15 seconds and rest and then resume when they begin the new activity.  If you work your way up to being able to do these videos without having to stop, I expect you will see significant improvements in your pain.  If you enjoy his videos and would like to find out more you can look on his website: [email or web address]  He has a workout streaming option as well as a DVD set available for purchase.  Amazon has the best price for his DVDs.     Please perform the exercise program that we have prepared for you and gone over in detail on a daily basis.  In addition to the handout you were provided you can access your program through: [email or web address]   Your unique program code is:  [email or web address]

## 2018-05-23 NOTE — Progress Notes (Signed)
Brian Davidson. Brian Davidson, Brian Davidson at [place]  Brian Davidson - 82 y.o. male MRN [id number]  Date of birth: 16-Oct-1935  Visit Date: 05/23/2018  PCP: Mosie Lukes, MD   Referred by: Mosie Lukes, MD   Scribe(s) for today's visit: Wendy Poet, LAT, ATC  SUBJECTIVE:  Brian Davidson "Brian Davidson" is here for New Patient (Initial Visit) (low back pain)    HPI: His lower back pain symptoms INITIALLY: Began a few months ago and has worsened over the past few weeks w/ no known MOI Described as moderate sharp pain w/ certain movements, nonradiating Worsened with transitioning from sitting to standing Improved with nothing noted Additional associated symptoms include: no radiating pain and no n/t noted in B LEs; no changes in B/B habits recently    At this time symptoms are improving compared to onset. He has been taking Tylenol prn.  *Pt has a pacemaker.  REVIEW OF SYSTEMS: Denies night time disturbances. Denies fevers, chills, or night sweats. Denies unexplained weight loss. Denies personal history of cancer. Denies changes in bowel or bladder habits. Denies recent unreported falls. Reports new or worsening dyspnea or wheezing.  Yes due to having a pacemaker. Reports headaches or dizziness. Yes to dizziness. Denies numbness, tingling or weakness  In the extremities.  Reports dizziness or presyncopal episodes Denies lower extremity edema    HISTORY:  Prior history reviewed and updated per electronic medical record.  Social History   Occupational History  . Occupation: Retired  . 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 Smoking status: Former Smoker    Years: 10.00    Types: Pipe, Cigars    Last attempt to quit: 11/18/1985    Years since quitting: 32.5  . Smokeless tobacco: Never Used  Substance and Sexual Activity  . Alcohol use: Yes    Alcohol/week: 3.0 standard drinks    Types: 3 Cans of  beer per week    Comment: occ  . Drug use: No  . Sexual activity: Never   Social History   Social History Narrative   Lives with wife, still works part-time as a Geophysicist/field seismologist. He is active around the house and yard...   Parents are deceased and did not have cardiac issues but he has 2 siblings with atrial fibrillation and a sister-in-law  has a pacemaker.    Past Medical History:  Diagnosis Date  . Arthritis   . Biliary dyskinesia   . Carotid artery disease ([place]) 07/01/2016  . Dyslipidemia 07/13/2017  . GERD (gastroesophageal reflux disease)   . H/O measles   . History of chicken pox   . Hypertension   . Kidney stone 03/2015  . Persistent atrial fibrillation ([place])   . Pneumonia 1992  . Tachycardia-bradycardia ([place])    a. s/p STJ dual chamber pacemaker  . Thiamine deficiency 01/19/2018  . Valvular heart disease 07/01/2016   Past Surgical History:  Procedure Laterality Date  . CHOLECYSTECTOMY N/A 11/24/2015   Procedure: LAPAROSCOPIC CHOLECYSTECTOMY;  Surgeon: Greer Pickerel, MD;  Location: [facility];  Service: General;  Laterality: N/A;  . CYSTOSCOPY  2016  . FRACTURE SURGERY  2005   left ankle  . HERNIA REPAIR     left groin, no mesh  . PERMANENT PACEMAKER INSERTION N/A 01/31/2012   SJM Accent SR RF implanted by DR Allred   family history includes Arthritis in his mother; Atrial fibrillation in his brother and sister; Drug abuse in  his mother; Heart disease in his brother, father, and sister; Heart disease (age of onset: 102) in his sister; Hypertension in his brother, mother, and son; Neuropathy in his brother; Stroke in his father. There is no history of Colon cancer.  DATA OBTAINED & REVIEWED:   Recent Labs    09/14/17 0923 01/19/18 0912  HGBA1C  --  5.7  LABURIC 6.7  --    . 05/23/2018: X-rays lumbar spine: Degenerative disc and facet disease no acute bony abnormality. .   OBJECTIVE:  VS:  HT:6' (182.9 cm)   WT:223 lb 12.8 oz (101.5 kg)  BMI:30.35    BP:132/74  HR:66bpm   TEMP: ( )  RESP:98 %   PHYSICAL EXAM: CONSTITUTIONAL: Well-developed, Well-nourished and In no acute distress PSYCHIATRIC: Alert & appropriately interactive. and Not depressed or anxious appearing. RESPIRATORY: No increased work of breathing and Trachea Midline EYES: Pupils are equal., EOM intact without nystagmus. and No scleral icterus.  VASCULAR EXAM: Warm and well perfused NEURO: unremarkable  MSK Exam: Back:  Well aligned, no significant deformity. No overlying skin changes. No focal bony tenderness   RANGE OF MOTION & STRENGTH  Good internal and external range of motion of the hips.  Lower extremity strength is otherwise intact other than hip abduction.   SPECIALITY TESTING:  Negative straight leg raise bilaterally other tight hamstrings.  Limited flexion and extension.  Negative neural tension signs with no pain with palpation of the greater sciatic notch or popliteal fossa.    ASSESSMENT   1. Chronic right-sided low back pain without sciatica   2. Somatic dysfunction of lumbar region   3. Somatic dysfunction of pelvis region   4. Somatic dysfunction of sacral region   5. Spondylosis     PLAN:  Pertinent additional documentation may be included in corresponding procedure notes, imaging studies, problem based documentation and patient instructions.  Procedures:  . Osteopathic manipulation was performed today based on physical exam findings.  Please see procedure note for further information including Osteopathic Exam findings . Discussed the foundation of treatment for this condition is physical therapy and/or daily (5-6 days/week) therapeutic exercises, focusing on core strengthening, coordination, neuromuscular control/reeducation.  Therapeutic exercises prescribed per procedure note.  Medications:  No orders of the defined types were placed in this encounter.  Discussion/Instructions: No problem-specific Assessment & Plan notes found for this  encounter.  . Discussed the underlying features of tight hip flexors leading to crouched, fetal like position that results in spinal column compression.  Including lumbar hyperflexion with hypermobility, thoracic flexion with restrictive rotation and cervical lordosis reversal  . He should do well with continued conservative management. . Links to [employer] provided today per Patient Instructions.  These exercises were developed by Minerva Ends, DC with a strong emphasis on core neuromuscular reducation and postural realignment through body-weight exercises. . Discussed red flag symptoms that warrant earlier emergent evaluation and patient voices understanding. . Activity modifications and the importance of avoiding exacerbating activities (limiting pain to no more than a 4 / 10 during or following activity) recommended and discussed.  Follow-up:  . Return in about 6 weeks (around 07/04/2018), or if symptoms worsen or fail to improve.   . If any lack of improvement consider: further diagnostic evaluation with MRI of the lumbar spine.  Could consider RFA versus facet injections. and referral to Physical Therapy  . At follow up will plan to consider: repeat osteopathic manipulation     CMA/ATC served as scribe during this visit.  History, Physical, and Plan performed by medical provider. Documentation and orders reviewed and attested to.      Gerda Diss, [facility] Physician

## 2018-05-24 ENCOUNTER — Ambulatory Visit (HOSPITAL_BASED_OUTPATIENT_CLINIC_OR_DEPARTMENT_OTHER)
Admission: RE | Admit: 2018-05-24 | Discharge: 2018-05-24 | Disposition: A | Discharge status: Home / Self Care | Payer: PPO | Source: Ambulatory Visit | Attending: Medical | Admitting: Medical

## 2018-05-24 ENCOUNTER — Encounter: Payer: Self-pay | Admitting: Medical

## 2018-05-24 ENCOUNTER — Ambulatory Visit: Payer: Self-pay

## 2018-05-24 ENCOUNTER — Ambulatory Visit (INDEPENDENT_AMBULATORY_CARE_PROVIDER_SITE_OTHER): Payer: PPO | Admitting: Medical

## 2018-05-24 VITALS — BP 152/71 | HR 65 | Temp 98.0°F | Resp 16 | Ht 72.0 in | Wt 221.8 lb

## 2018-05-24 DIAGNOSIS — IMO0001 Reserved for inherently not codable concepts without codable children: Secondary | ICD-10-CM

## 2018-05-24 DIAGNOSIS — R1110 Vomiting, unspecified: Secondary | ICD-10-CM | POA: Diagnosis not present

## 2018-05-24 DIAGNOSIS — R42 Dizziness and giddiness: Secondary | ICD-10-CM | POA: Diagnosis not present

## 2018-05-24 DIAGNOSIS — R112 Nausea with vomiting, unspecified: Secondary | ICD-10-CM

## 2018-05-24 DIAGNOSIS — H8141 Vertigo of central origin, right ear: Secondary | ICD-10-CM | POA: Insufficient documentation

## 2018-05-24 MED ORDER — MECLIZINE HCL 12.5 MG PO TABS: 12.5000 mg | ORAL_TABLET | Freq: Three times a day (TID) | ORAL | 0 refills | Status: DC | PRN

## 2018-05-24 NOTE — Telephone Encounter (Signed)
Pt. Reports he was in bed this morning and turned over and the room started spinning. He changed his position and "it got a little better." Has had this in the past "and it would just go away." "My wife tried to help me do some maneuvers that she has done. That made it worse." Pt. Has vomited x 2. Took a Dramamine and has an appointment for this afternoon.  Reason for Disposition . [1] MODERATE dizziness (e.g., vertigo; feels very unsteady, interferes with normal activities) AND [2] has NOT been evaluated by physician for this  Answer Assessment - Initial Assessment Questions 1. DESCRIPTION: "Describe your dizziness."      Dizzy 2. VERTIGO: "Do you feel like either you or the room is spinning or tilting?"      Yes 3. LIGHTHEADED: "Do you feel lightheaded?" (e.g., somewhat faint, woozy, weak upon standing)     Woozy 4. SEVERITY: "How bad is it?"  "Can you walk?"   - MILD - Feels unsteady but walking normally.   - MODERATE - Feels very unsteady when walking, but not falling; interferes with normal activities (e.g., school, work) .   - SEVERE - Unable to walk without falling (requires assistance).     Moderate 5. ONSET:  "When did the dizziness begin?"     This morning 6. AGGRAVATING FACTORS: "Does anything make it worse?" (e.g., standing, change in head position)     Changing head position 7. CAUSE: "What do you think is causing the dizziness?"     Unsure 8. RECURRENT SYMPTOM: "Have you had dizziness before?" If so, ask: "When was the last time?" "What happened that time?"     Yes - but this is the worst 9. OTHER SYMPTOMS: "Do you have any other symptoms?" (e.g., headache, weakness, numbness, vomiting, earache)     Nausea and vomiting x 2  10. PREGNANCY: "Is there any chance you are pregnant?" "When was your last menstrual period?"       n/a  Protocols used: DIZZINESS - VERTIGO-A-AH

## 2018-05-24 NOTE — Progress Notes (Signed)
Subjective:    Patient ID: Brian Davidson, male    DOB: Jun 16, 1936, 82 y.o.   MRN: [id number]  HPI  Pt in states this morning around 6:45 in morning rolled over in bed and had severe vertigo when he rolled on rt side. Then he turned on left side and vertigo went away.  Then he later layed on rt side again and symptoms came back. He tried some Epley maneuver guided by his wife and symptoms got worse. He vomited one time. Pt then got up and symptoms eased up. But he states he does not feel right presently and still light headed.   He took 2 dramamine and symptoms resolved. He feels mild light headed now but no spinning. No gross motor or sensory function deficits. No HA. No vision changes.  Pt states may have ha vertigo in pat but not this severe.    Review of Systems  Constitutional: Negative for chills, diaphoresis, fatigue and fever.  Respiratory: Negative for chest tightness, shortness of breath and wheezing.   Cardiovascular: Negative for chest pain and palpitations.       Hx of atrial fib.  Gastrointestinal: Positive for nausea and vomiting. Negative for abdominal pain, blood in stool and constipation.  Musculoskeletal: Negative for back pain.  Skin: Negative for rash.  Neurological: Positive for dizziness and light-headedness. Negative for tremors, syncope, facial asymmetry, weakness, numbness and headaches.       See hpi.  Hematological: Negative for adenopathy. Does not bruise/bleed easily.  Psychiatric/Behavioral: Negative for behavioral problems and confusion.    Past Medical History:  Diagnosis Date  . Arthritis   . Biliary dyskinesia   . Carotid artery disease ([place]) 07/01/2016  . Dyslipidemia 07/13/2017  . GERD (gastroesophageal reflux disease)   . H/O measles   . History of chicken pox   . Hypertension   . Kidney stone 03/2015  . Persistent atrial fibrillation ([place])   . Pneumonia 1992  . Tachycardia-bradycardia ([place])    a. s/p STJ dual chamber pacemaker  .  Thiamine deficiency 01/19/2018  . Valvular heart disease 07/01/2016     Social History   Socioeconomic History  . Marital status: Married    Spouse name: [name]  . Number of children: 1  . Years of education: Not on file  . Highest education level: Not on file  Occupational History  . Occupation: Retired  . Occupation: Dance movement psychotherapist for [employer]  . Financial resource strain: Not on file  . Food insecurity:    Worry: Not on file    Inability: Not on file  . Transportation needs:    Medical: Not on file    Non-medical: Not on file  Tobacco Use  . Smoking status: Former Smoker    Years: 10.00    Types: Pipe, Cigars    Last attempt to quit: 11/18/1985    Years since quitting: 32.5  . Smokeless tobacco: Never Used  Substance and Sexual Activity  . Alcohol use: Yes    Alcohol/week: 3.0 standard drinks    Types: 3 Cans of beer per week    Comment: occ  . Drug use: No  . Sexual activity: Never  Lifestyle  . Physical activity:    Days per week: Not on file    Minutes per session: Not on file  . Stress: Not on file  Relationships  . Social connections:    Talks on phone: Not on file    Gets together: Not on file  Attends religious service: Not on file    Active member of club or organization: Not on file    Attends meetings of clubs or organizations: Not on file    Relationship status: Not on file  . Intimate partner violence:    Fear of current or ex partner: Not on file    Emotionally abused: Not on file    Physically abused: Not on file    Forced sexual activity: Not on file  Other Topics Concern  . Not on file  Social History Narrative   Lives with wife, still works part-time as a Geophysicist/field seismologist. He is active around the house and yard...   Parents are deceased and did not have cardiac issues but he has 2 siblings with atrial fibrillation and a sister-in-law  has a pacemaker.    Past Surgical History:  Procedure Laterality Date  . CHOLECYSTECTOMY  N/A 11/24/2015   Procedure: LAPAROSCOPIC CHOLECYSTECTOMY;  Surgeon: Greer Pickerel, MD;  Location: [facility];  Service: General;  Laterality: N/A;  . CYSTOSCOPY  2016  . FRACTURE SURGERY  2005   left ankle  . HERNIA REPAIR     left groin, no mesh  . PERMANENT PACEMAKER INSERTION N/A 01/31/2012   SJM Accent SR RF implanted by DR Allred    Family History  Problem Relation Age of Onset  . Hypertension Mother   . Arthritis Mother   . Drug abuse Mother   . Stroke Father   . Heart disease Father   . Heart disease Sister 73  . Heart disease Brother   . Hypertension Son   . Heart disease Sister   . Neuropathy Brother   . Hypertension Brother   . Atrial fibrillation Brother   . Atrial fibrillation Sister   . Colon cancer Neg Hx     Allergies  Allergen Reactions  . Cardizem [Diltiazem]     Rash from Yellow dye in generic capsule. Can take different brand. Takes diltiazem - his allergy is to Cardizem  . Diltiazem Hcl     Rash from red dye in generic capsule. Can take different brand. Takes diltiazem - his allergy is to Cardizem Rash from red dye in generic capsule. Can take different brand. Takes diltiazem - his allergy is to Cardizem  . Sulfa Antibiotics     Unknown childhood reaction    Current Outpatient Medications on File Prior to Visit  Medication Sig Dispense Refill  . diltiazem (CARDIZEM CD) 240 MG 24 hr capsule TAKE ONE (1) CAPSULE EACH DAY 90 capsule 3  . gabapentin (NEURONTIN) 300 MG capsule Take 1-2 capsules (300-600 mg total) by mouth at bedtime. 60 capsule 5  . irbesartan-hydrochlorothiazide (AVALIDE) 150-12.5 MG tablet TAKE 1/2 (ONE-HALF) TABLET BY MOUTH ONCE DAILY 45 tablet 1  . loratadine (CLARITIN) 10 MG tablet Take 10 mg by mouth daily as needed. Reported on 12/31/2015    . metoprolol tartrate (LOPRESSOR) 100 MG tablet TAKE ONE TABLET BY MOUTH TWICE DAILY 180 tablet 3  . Turmeric 500 MG TABS Take by mouth.    Alveda Reasons 20 MG TABS tablet TAKE ONE TABLET BY MOUTH  ONCE DAILY 90 tablet 3   No current facility-administered medications on file prior to visit.     BP (!) 152/71   Pulse 65   Temp 98 F (36.7 C) (Oral)   Resp 16   Ht 6' (1.829 m)   Wt 221 lb 12.8 oz (100.6 kg)   SpO2 98%   BMI 30.08 kg/m  Objective:   Physical Exam  General Mental Status- Alert. General Appearance- Not in acute distress.   Skin General: Color- Normal Color. Moisture- Normal Moisture.  Neck Carotid Arteries- Normal color. Moisture- Normal Moisture. No carotid bruits. No JVD.  Chest and Lung Exam Auscultation: Breath Sounds:-Normal.  Cardiovascular Auscultation:Rythm- Regular, irregular. Hx of atrial fibrillation. Murmurs & Other Heart Sounds:Auscultation of the heart reveals- No Murmurs.   Neurologic Cranial Nerve exam:- CN III-XII intact(No nystagmus), symmetric smile. Drift Test:- No drift. Romberg Exam:- Negative.  Heal to Toe Gait exam:-unsteady on heal to toe. Finger to Nose:- Normal/Intact Strength:- 5/5 equal and symmetric strength both upper and lower extremities.     Assessment & Plan:  For your recent severe dizziness/vertigo with vomiting and heel-to-toe gait instability, I do think it would be best to go ahead and get a CT head without contrast today.  Please go down stairs for that test after you do the blood work which includes a CBC and CMP.  I did prescribe you meclizine to use.  I recommend over the next 24 hours to use 1 tablet every 8 hours.  Then over the weekend to use all as needed basis every 8 hours.  We will update you on lab and CT head results.  If you were to have worse symptoms as discussed despite negative CT then would recommend ED evaluation at [facility] for possible MRI.  Would recommend that you try Epley maneuvers this weekend if needed.  I am providing print copy of those instructions.  If vertigo type symptoms were to persist then could refer you to physical therapy.  Follow-up in 5 to 7 days or as  needed.  Mackie Pai, PA-C

## 2018-05-24 NOTE — Patient Instructions (Signed)
For your recent severe dizziness/vertigo with vomiting and heel-to-toe gait instability, I do think it would be best to go ahead and get a CT head without contrast today.  Please go down stairs for that test after you do the blood work which includes a CBC and CMP.  I did prescribe you meclizine to use.  I recommend over the next 24 hours to use 1 tablet every 8 hours.  Then over the weekend to use all as needed basis every 8 hours.  We will update you on lab and CT head results.  If you were to have worse symptoms as discussed despite negative CT then would recommend ED evaluation at [facility] for possible MRI.  Would recommend that you try Epley maneuvers this weekend if needed.  I am providing print copy of those instructions.  If vertigo type symptoms were to persist then could refer you to physical therapy.  Follow-up in 5 to 7 days or as needed.  How to Perform the Epley Maneuver The Epley maneuver is an exercise that relieves symptoms of vertigo. Vertigo is the feeling that you or your surroundings are moving when they are not. When you feel vertigo, you may feel like the room is spinning and have trouble walking. Dizziness is a little different than vertigo. When you are dizzy, you may feel unsteady or light-headed. You can do this maneuver at home whenever you have symptoms of vertigo. You can do it up to 3 times a day until your symptoms go away. Even though the Epley maneuver may relieve your vertigo for a few weeks, it is possible that your symptoms will return. This maneuver relieves vertigo, but it does not relieve dizziness. What are the risks? If it is done correctly, the Epley maneuver is considered safe. Sometimes it can lead to dizziness or nausea that goes away after a short time. If you develop other symptoms, such as changes in vision, weakness, or numbness, stop doing the maneuver and call your health care provider. How to perform the Epley maneuver 1. Sit on the edge of a bed or  table with your back straight and your legs extended or hanging over the edge of the bed or table. 2. Turn your head halfway toward the affected ear or side. 3. Lie backward quickly with your head turned until you are lying flat on your back. You may want to position a pillow under your shoulders. 4. Hold this position for 30 seconds. You may experience an attack of vertigo. This is normal. 5. Turn your head to the opposite direction until your unaffected ear is facing the floor. 6. Hold this position for 30 seconds. You may experience an attack of vertigo. This is normal. Hold this position until the vertigo stops. 7. Turn your whole body to the same side as your head. Hold for another 30 seconds. 8. Sit back up. You can repeat this exercise up to 3 times a day. Follow these instructions at home:  After doing the Epley maneuver, you can return to your normal activities.  Ask your health care provider if there is anything you should do at home to prevent vertigo. He or she may recommend that you: ? Keep your head raised (elevated) with two or more pillows while you sleep. ? Do not sleep on the side of your affected ear. ? Get up slowly from bed. ? Avoid sudden movements during the day. ? Avoid extreme head movement, like looking up or bending over. Contact a health  care provider if:  Your vertigo gets worse.  You have other symptoms, including: ? Nausea. ? Vomiting. ? Headache. Get help right away if:  You have vision changes.  You have a severe or worsening headache or neck pain.  You cannot stop vomiting.  You have new numbness or weakness in any part of your body. Summary  Vertigo is the feeling that you or your surroundings are moving when they are not.  The Epley maneuver is an exercise that relieves symptoms of vertigo.  If the Epley maneuver is done correctly, it is considered safe. You can do it up to 3 times a day. This information is not intended to replace advice  given to you by your health care provider. Make sure you discuss any questions you have with your health care provider. Document Released: 08/27/2013 Document Revised: 07/12/2016 Document Reviewed: 07/12/2016 Elsevier Interactive Patient Education  2017 [employer].

## 2018-05-25 ENCOUNTER — Encounter: Payer: Self-pay | Admitting: Medical

## 2018-05-25 ENCOUNTER — Telehealth: Payer: Self-pay

## 2018-05-25 LAB — CBC WITH DIFFERENTIAL/PLATELET
Basophils Absolute: 0.1 10*3/uL (ref 0.0–0.1)
Basophils Relative: 1.1 % (ref 0.0–3.0)
EOS PCT: 0.5 % (ref 0.0–5.0)
Eosinophils Absolute: 0 10*3/uL (ref 0.0–0.7)
HEMATOCRIT: 44.2 % (ref 39.0–52.0)
Hemoglobin: 14.8 g/dL (ref 13.0–17.0)
LYMPHS ABS: 0.8 10*3/uL (ref 0.7–4.0)
LYMPHS PCT: 11.1 % — AB (ref 12.0–46.0)
MCHC: 33.5 g/dL (ref 30.0–36.0)
MCV: 89.2 fl (ref 78.0–100.0)
Monocytes Absolute: 0.6 10*3/uL (ref 0.1–1.0)
Monocytes Relative: 8.3 % (ref 3.0–12.0)
NEUTROS ABS: 5.8 10*3/uL (ref 1.4–7.7)
NEUTROS PCT: 79 % — AB (ref 43.0–77.0)
PLATELETS: 145 10*3/uL — AB (ref 150.0–400.0)
RBC: 4.95 Mil/uL (ref 4.22–5.81)
RDW: 14.2 % (ref 11.5–15.5)
WBC: 7.3 10*3/uL (ref 4.0–10.5)

## 2018-05-25 LAB — COMPREHENSIVE METABOLIC PANEL
ALK PHOS: 73 U/L (ref 39–117)
ALT: 12 U/L (ref 0–53)
AST: 17 U/L (ref 0–37)
Albumin: 4.1 g/dL (ref 3.5–5.2)
BILIRUBIN TOTAL: 1.2 mg/dL (ref 0.2–1.2)
BUN: 18 mg/dL (ref 6–23)
CALCIUM: 9.3 mg/dL (ref 8.4–10.5)
CO2: 29 mEq/L (ref 19–32)
Chloride: 101 mEq/L (ref 96–112)
Creatinine, Ser: 1.19 mg/dL (ref 0.40–1.50)
GFR: 62.41 mL/min (ref >60.00)
GLUCOSE: 110 mg/dL — AB (ref 70–99)
POTASSIUM: 4.1 meq/L (ref 3.5–5.1)
Sodium: 138 mEq/L (ref 135–145)
TOTAL PROTEIN: 6.9 g/dL (ref 6.0–8.3)

## 2018-05-25 NOTE — Telephone Encounter (Signed)
Copied from [place] [id number]. Topic: Inquiry >> May 25, 2018 10:57 AM Reyne Dumas L wrote: Reason for CRM:   Pt calling to get results of xrays done on Wednesday order by Dr. Ileene Rubens Pt can be reached at [phone] or [phone]

## 2018-05-25 NOTE — Telephone Encounter (Signed)
IMPRESSION: Degenerative disc and facet disease.  No acute bony abnormality.

## 2018-05-25 NOTE — Telephone Encounter (Signed)
Spoke with pt and advised of results. No further action needed at this time.

## 2018-05-27 ENCOUNTER — Encounter: Payer: Self-pay | Admitting: Sports Medicine

## 2018-05-29 ENCOUNTER — Encounter: Payer: Self-pay | Admitting: Medical

## 2018-05-29 ENCOUNTER — Telehealth: Payer: Self-pay | Admitting: Medical

## 2018-05-29 DIAGNOSIS — R42 Dizziness and giddiness: Secondary | ICD-10-CM

## 2018-05-29 NOTE — Telephone Encounter (Signed)
Referral place to PT. Epley maneuvers.

## 2018-05-31 ENCOUNTER — Other Ambulatory Visit: Payer: Self-pay

## 2018-05-31 ENCOUNTER — Encounter: Payer: Self-pay | Admitting: Physical Therapy

## 2018-05-31 ENCOUNTER — Ambulatory Visit: Payer: PPO | Attending: Medical | Admitting: Physical Therapy

## 2018-05-31 DIAGNOSIS — R42 Dizziness and giddiness: Secondary | ICD-10-CM | POA: Diagnosis not present

## 2018-05-31 DIAGNOSIS — R2681 Unsteadiness on feet: Secondary | ICD-10-CM

## 2018-05-31 DIAGNOSIS — H8113 Benign paroxysmal vertigo, bilateral: Secondary | ICD-10-CM

## 2018-05-31 NOTE — Therapy (Signed)
[facility] [address]  [place], [place], [address] Phone: [phone]   Fax:  [phone]  Physical Therapy Evaluation  Patient Details  Name: Brian Davidson MRN: [id number] Date of Birth: 16-Oct-1935 Referring Provider (PT): Mackie Pai, [place]   Encounter Date: 05/31/2018  PT End of Session - 05/31/18 0805    Visit Number  1    Number of Visits  8    Date for PT Re-Evaluation  06/28/18    Authorization Type  [employer]    PT Start Time  0805    PT Stop Time  0848    PT Time Calculation (min)  43 min    Activity Tolerance  Patient tolerated treatment well    Behavior During Therapy  [facility] for tasks assessed/performed       Past Medical History:  Diagnosis Date  . Arthritis   . Biliary dyskinesia   . Carotid artery disease ([place]) 07/01/2016  . Dyslipidemia 07/13/2017  . GERD (gastroesophageal reflux disease)   . H/O measles   . History of chicken pox   . Hypertension   . Kidney stone 03/2015  . Persistent atrial fibrillation ([place])   . Pneumonia 1992  . Tachycardia-bradycardia ([place])    a. s/p STJ dual chamber pacemaker  . Thiamine deficiency 01/19/2018  . Valvular heart disease 07/01/2016    Past Surgical History:  Procedure Laterality Date  . CHOLECYSTECTOMY N/A 11/24/2015   Procedure: LAPAROSCOPIC CHOLECYSTECTOMY;  Surgeon: Greer Pickerel, MD;  Location: [facility];  Service: General;  Laterality: N/A;  . CYSTOSCOPY  2016  . FRACTURE SURGERY  2005   left ankle  . HERNIA REPAIR     left groin, no mesh  . PERMANENT PACEMAKER INSERTION N/A 01/31/2012   SJM Accent SR RF implanted by DR Allred    There were no vitals filed for this visit.   Subjective Assessment - 05/31/18 0809    Subjective  Last Wed, pt reports he woke up with vertigo. Wife tried to show him some of the things she did to treat her vertigo, but it made it worse to the point he vomited. Has affected him each morning upon waking  but not too much during the day, other than reaching down to wipe up floor in the bathroom after his shower.    Patient Stated Goals  "to not be dizzy when changing positions"    Currently in Pain?  No/denies         [facility] PT Assessment - 05/31/18 0805      Assessment   Medical Diagnosis  Vertigo    Referring Provider (PT)  Mackie Pai, PA-C    Onset Date/Surgical Date  05/23/18    Next MD Visit  none scheduled    Prior Therapy  none      Balance Screen   Has the patient fallen in the past 6 months  No    Has the patient had a decrease in activity level because of a fear of falling?   No    Is the patient reluctant to leave their home because of a fear of falling?   No      Home Film/video editor residence    Living Arrangements  Spouse/significant other      Prior Function   Level of [place]  Retired      Associate Professor   Overall Cognitive Status  Within Functional  Limits for tasks assessed           Vestibular Assessment - 05/31/18 0805      Symptom Behavior   Type of Dizziness  Spinning    Frequency of Dizziness  getting in/out of bed primarily    Duration of Dizziness  ~3 minutes    Aggravating Factors  Rolling to right;Forward bending;Supine to sit   sit to supine   Relieving Factors  Head stationary      Occulomotor Exam   Occulomotor Alignment  Normal    Spontaneous  Absent    Gaze-induced  Absent    Head shaking Horizontal  Absent    Head Shaking Vertical  Absent    Smooth Pursuits  Intact      Positional Testing   Dix-Hallpike  Dix-Hallpike Right;Dix-Hallpike Left    Horizontal Canal Testing  Horizontal Canal Right;Horizontal Canal Left      Dix-Hallpike Right   Dix-Hallpike Right Duration  negative    Dix-Hallpike Right Symptoms  No nystagmus      Dix-Hallpike Left   Dix-Hallpike Left Duration  25 sec    Dix-Hallpike Left Symptoms  --   mild spinning, no visible nystagmus     Horizontal  Canal Right   Horizontal Canal Right Duration  30 sec    Horizontal Canal Right Symptoms  Nystagmus;Geotrophic      Horizontal Canal Left   Horizontal Canal Left Duration  20 sec    Horizontal Canal Left Symptoms  Nystagmus;Ageotrophic      Positional Sensitivities   Sit to Supine  Moderate dizziness    Supine to Left Side  Moderate dizziness    Supine to Right Side  Moderate dizziness    Supine to Sitting  Lightheadedness    Up from Right Hallpike  Lightheadedness    Up from Left Hallpike  Lightheadedness    Head Turning x 5  No dizziness    Head Nodding x 5  No dizziness    Rolling Right  Moderate dizziness    Rolling Left  Moderate dizziness          Objective measurements completed on examination: See above findings.       Vestibular Treatment/Exercise - 05/31/18 0805      Vestibular Treatment/Exercise   Vestibular Treatment Provided  Canalith Repositioning    Canalith Repositioning  Epley Manuever Left;Canal Roll Left       EPLEY MANUEVER LEFT   Number of Reps   1    Overall Response   No change      Canal Roll Left   Number of Reps   1    Overall Response   Improved Symptoms            PT Education - 05/31/18 0845    Education Details  PT eval findings, anticipated POC and BPPV education    Person(s) Educated  Patient    Methods  Explanation;Handout    Comprehension  Verbalized understanding          PT Long Term Goals - 05/31/18 0848      PT LONG TERM GOAL #1   Title  Pt will report no further instances of spinning dizzines upon rising from bed in the morning    Status  New    Target Date  06/28/18      PT LONG TERM GOAL #2   Title  Pt will be independent with habituation exercises as indicated for HEP    Status  New  Target Date  06/28/18      PT LONG TERM GOAL #3   Title  Pt will verbalize understanding of strategies to reduce fall risk in presence of dizziness    Status  New    Target Date  06/28/18             Plan -  05/31/18 0848    Clinical Impression Statement  Brian Davidson is an 82 y/o male who presents to OP PT with acute onset of vertigo 1 week ago. Pt describes dizziness primarily as spinning in nature and occurs most commonly when rolling over in bed and rising from bed in the morning, although during assessment pt also reported occasional sensation of lightheadedness.  Vestibular testing with B dix hallpike producing only minor symptoms with L dix hallpike, negative R horizontal canal testing, but mild dizziness & nystagmus elicited with L horizontal canal testing, with more pronounced dizziness & nystagmus during  horizontal roll canalith repositioning with head turns to R. Brian Davidson will benefit from further PT for vestibular rehab including canalith repositioning as indicated.    Clinical Presentation  Evolving    Clinical Decision Making  Low    Rehab Potential  Good    PT Frequency  2x / week    PT Duration  4 weeks    PT Treatment/Interventions  Patient/family education;Vestibular;Canalith Repostioning;Visual/perceptual remediation/compensation;Neuromuscular re-education;ADLs/Self Care Home Management    Consulted and Agree with Plan of Care  Patient       Patient will benefit from skilled therapeutic intervention in order to improve the following deficits and impairments:  Dizziness, Postural dysfunction, Impaired vision/preception  Visit Diagnosis: Dizziness and giddiness  BPPV (benign paroxysmal positional vertigo), bilateral  Unsteadiness on feet     Problem List Patient Active Problem List   Diagnosis Date Noted  . Multiple closed fractures of ribs of right side 01/21/2018  . URI (upper respiratory infection) 01/21/2018  . Thiamine deficiency 01/19/2018  . Peripheral neuropathy 07/13/2017  . Dyslipidemia 07/13/2017  . Preventative health care 01/11/2017  . Carotid artery disease ([place]) 07/01/2016  . Valvular heart disease 07/01/2016  . Back pain 12/31/2015  . Osteopenia 12/31/2015  .  Allergic state 12/31/2015  . Skin lesion 12/31/2015  . History of chicken pox   . Calculi, ureter 03/30/2015  . Arthritis of knee, degenerative 05/10/2014  . Pacemaker-St.Jude 02/01/2012  . Permanent atrial fibrillation ([place])   . Hypertension   . Heart murmur   . Chronic anticoagulation     Percival Spanish, PT, MPT 05/31/2018, 7:26 PM  [facility] [address]  [place], [place], [address] Phone: [phone]   Fax:  [phone]  Name: FLOYD WADE MRN: [id number] Date of Birth: [birth year]

## 2018-06-04 ENCOUNTER — Telehealth: Payer: Self-pay | Admitting: Cardiology

## 2018-06-04 ENCOUNTER — Ambulatory Visit (INDEPENDENT_AMBULATORY_CARE_PROVIDER_SITE_OTHER): Payer: PPO | Admitting: *Deleted

## 2018-06-04 DIAGNOSIS — I495 Sick sinus syndrome: Secondary | ICD-10-CM

## 2018-06-04 NOTE — Telephone Encounter (Signed)
Confirmed remote transmission w/ pt wife.   

## 2018-06-05 NOTE — Progress Notes (Signed)
Remote pacemaker transmission.   

## 2018-06-06 ENCOUNTER — Ambulatory Visit: Payer: PPO | Attending: Medical | Admitting: Physical Therapy

## 2018-06-06 ENCOUNTER — Encounter: Payer: Self-pay | Admitting: Physical Therapy

## 2018-06-06 DIAGNOSIS — R2681 Unsteadiness on feet: Secondary | ICD-10-CM

## 2018-06-06 DIAGNOSIS — H8113 Benign paroxysmal vertigo, bilateral: Secondary | ICD-10-CM | POA: Insufficient documentation

## 2018-06-06 DIAGNOSIS — R42 Dizziness and giddiness: Secondary | ICD-10-CM | POA: Diagnosis not present

## 2018-06-06 NOTE — Therapy (Signed)
[facility] [address]  [place], [place], [address] Phone: [phone]   Fax:  [phone]  Physical Therapy Treatment  Patient Details  Name: MIRACLE CRIADO MRN: [id number] Date of Birth: 1935/11/07 Referring Provider (PT): Mackie Pai, [place]   Encounter Date: 06/06/2018  PT End of Session - 06/06/18 1240    Visit Number  2    Number of Visits  8    Date for PT Re-Evaluation  06/28/18    Authorization Type  [employer]    PT Start Time  0800    PT Stop Time  0840    PT Time Calculation (min)  40 min    Activity Tolerance  Patient tolerated treatment well    Behavior During Therapy  [facility] for tasks assessed/performed       Past Medical History:  Diagnosis Date  . Arthritis   . Biliary dyskinesia   . Carotid artery disease ([place]) 07/01/2016  . Dyslipidemia 07/13/2017  . GERD (gastroesophageal reflux disease)   . H/O measles   . History of chicken pox   . Hypertension   . Kidney stone 03/2015  . Persistent atrial fibrillation   . Pneumonia 1992  . Tachycardia-bradycardia ([place])    a. s/p STJ dual chamber pacemaker  . Thiamine deficiency 01/19/2018  . Valvular heart disease 07/01/2016    Past Surgical History:  Procedure Laterality Date  . CHOLECYSTECTOMY N/A 11/24/2015   Procedure: LAPAROSCOPIC CHOLECYSTECTOMY;  Surgeon: Greer Pickerel, MD;  Location: [facility];  Service: General;  Laterality: N/A;  . CYSTOSCOPY  2016  . FRACTURE SURGERY  2005   left ankle  . HERNIA REPAIR     left groin, no mesh  . PERMANENT PACEMAKER INSERTION N/A 01/31/2012   SJM Accent SR RF implanted by DR Allred    There were no vitals filed for this visit.  Subjective Assessment - 06/06/18 0800    Subjective  Reports still having dizziness- worst at night when rolling over in bed. Reports he also had an episode when picking up an item from the floor. Feeling overall better today compared to last session. No dizziness  today. When he gets dizziness he feels in in the back of his head.     Currently in Pain?  No/denies        Assessment:  R Dix Hallpike: negative L Dix Hallpike- symptomatic and positive for L beating horizontal nystagmus R roll test- positive for symptoms and ageotrophic nystagmus  L roll test- positive for symptoms and geotrophic nystagmus    Treatment:  L BBQ Roll horizontal canalith repositioning maneuver- temporarily symptomatic after maneuver Education and demonstration of HEP: R & L Brand Daroff and R & L supine cervical rotation  Heavy education on post-maneuver instructions and review of handout                        PT Education - 06/06/18 1239    Education Details  update to HEP and handout for post-canalith repositioning maneuver instructions    Person(s) Educated  Patient    Methods  Explanation;Demonstration;Tactile cues;Verbal cues;Handout    Comprehension  Verbalized understanding          PT Long Term Goals - 06/06/18 1250      PT LONG TERM GOAL #1   Title  Pt will report no further instances of spinning dizzines upon rising from bed in the morning    Status  On-going  PT LONG TERM GOAL #2   Title  Pt will be independent with habituation exercises as indicated for HEP    Status  On-going      PT LONG TERM GOAL #3   Title  Pt will verbalize understanding of strategies to reduce fall risk in presence of dizziness    Status  On-going            Plan - 06/06/18 1241    Clinical Impression Statement  Patient arrived to session with report of continued dizziness, however feeling a better since last session. Reports couple episodes of severe spinning sensation when rolling in bed and when bending forward to pick something up- denies falls. L dix hallpike tested and initially positive- symptomatic and with what seemed to be L horizontal nystagmus without a torsional component. Upon starting Epley maneuver for the L, patient  symptomatic and with L beating horizontal nystagmus with R head turn. Discontinued Epley maneuver and performed roll test which was symptomatic and with ageotropic nystagmus to R, geotropic nystagmus to L. Due to increased severity of symptoms to L, performed BBQ roll maneuver to L side. Patient still symptomatic after completion of maneuver, which dissipated. Educated heavily on post-maneuver instructions and on habituation HEP. Patient reported understanding of all instructions and reported no dizziness at end of session.     PT Treatment/Interventions  Patient/family education;Vestibular;Canalith Repostioning;Visual/perceptual remediation/compensation;Neuromuscular re-education;ADLs/Self Care Home Management    PT Next Visit Plan  reassess HEP, re-test horizontal canals     Consulted and Agree with Plan of Care  Patient       Patient will benefit from skilled therapeutic intervention in order to improve the following deficits and impairments:  Dizziness, Postural dysfunction, Impaired vision/preception  Visit Diagnosis: Dizziness and giddiness  BPPV (benign paroxysmal positional vertigo), bilateral  Unsteadiness on feet     Problem List Patient Active Problem List   Diagnosis Date Noted  . Multiple closed fractures of ribs of right side 01/21/2018  . URI (upper respiratory infection) 01/21/2018  . Thiamine deficiency 01/19/2018  . Peripheral neuropathy 07/13/2017  . Dyslipidemia 07/13/2017  . Preventative health care 01/11/2017  . Carotid artery disease ([place]) 07/01/2016  . Valvular heart disease 07/01/2016  . Back pain 12/31/2015  . Osteopenia 12/31/2015  . Allergic state 12/31/2015  . Skin lesion 12/31/2015  . History of chicken pox   . Calculi, ureter 03/30/2015  . Arthritis of knee, degenerative 05/10/2014  . Pacemaker-St.Jude 02/01/2012  . Permanent atrial fibrillation   . Hypertension   . Heart murmur   . Chronic anticoagulation     Janene Harvey, PT,  DPT 06/06/18 12:58 PM   [facility] [address]  [place], [place], [address] Phone: [phone]   Fax:  [phone]  Name: LUAN MABERRY MRN: [id number] Date of Birth: 06-10-1936

## 2018-06-06 NOTE — Patient Instructions (Signed)
Brandt daroff R & L 5x each side  Supine cervical rotation habituation exercise 5x each side

## 2018-06-08 ENCOUNTER — Ambulatory Visit: Payer: PPO | Admitting: Physical Therapy

## 2018-06-08 ENCOUNTER — Encounter: Payer: Self-pay | Admitting: Physical Therapy

## 2018-06-08 VITALS — BP 128/76 | HR 76

## 2018-06-08 DIAGNOSIS — H8113 Benign paroxysmal vertigo, bilateral: Secondary | ICD-10-CM

## 2018-06-08 DIAGNOSIS — R42 Dizziness and giddiness: Secondary | ICD-10-CM

## 2018-06-08 DIAGNOSIS — R2681 Unsteadiness on feet: Secondary | ICD-10-CM

## 2018-06-08 NOTE — Therapy (Signed)
[facility] [address]  [place], [place], [address] Phone: [phone]   Fax:  [phone]  Physical Therapy Treatment  Patient Details  Name: Brian Davidson MRN: [id number] Date of Birth: 12/01/1935 Referring Provider (PT): Mackie Pai, [place]   Encounter Date: 06/08/2018  PT End of Session - 06/08/18 0759    Visit Number  3    Number of Visits  8    Date for PT Re-Evaluation  06/28/18    Authorization Type  [employer]    PT Start Time  0759    PT Stop Time  0843    PT Time Calculation (min)  44 min    Activity Tolerance  Patient tolerated treatment well    Behavior During Therapy  [facility] for tasks assessed/performed       Past Medical History:  Diagnosis Date  . Arthritis   . Biliary dyskinesia   . Carotid artery disease ([place]) 07/01/2016  . Dyslipidemia 07/13/2017  . GERD (gastroesophageal reflux disease)   . H/O measles   . History of chicken pox   . Hypertension   . Kidney stone 03/2015  . Persistent atrial fibrillation   . Pneumonia 1992  . Tachycardia-bradycardia ([place])    a. s/p STJ dual chamber pacemaker  . Thiamine deficiency 01/19/2018  . Valvular heart disease 07/01/2016    Past Surgical History:  Procedure Laterality Date  . CHOLECYSTECTOMY N/A 11/24/2015   Procedure: LAPAROSCOPIC CHOLECYSTECTOMY;  Surgeon: Greer Pickerel, MD;  Location: [facility];  Service: General;  Laterality: N/A;  . CYSTOSCOPY  2016  . FRACTURE SURGERY  2005   left ankle  . HERNIA REPAIR     left groin, no mesh  . PERMANENT PACEMAKER INSERTION N/A 01/31/2012   SJM Accent SR RF implanted by DR Allred    Vitals:   06/08/18 0816 06/08/18 0820 06/08/18 0822  BP: (!) 146/86 140/82 128/76  Pulse: 76 81 76  SpO2: 98%      Subjective Assessment - 06/08/18 0804    Subjective  Pt reports no epsiodes of dizziness since last session other than while performing habituation exercises, but states he "felt lousy"  (fatigued) after completing habituation HEP yesterday morning which lasted about half the day.     Currently in Pain?  No/denies             Vestibular Assessment - 06/08/18 0759      Positional Testing   Horizontal Canal Testing  Horizontal Canal Right;Horizontal Canal Left;Horizontal Canal Right Intensity;Horizontal Canal Left Intensity      Horizontal Canal Right   Horizontal Canal Right Duration  20 sec    Horizontal Canal Right Symptoms  Nystagmus;Geotrophic      Horizontal Canal Left   Horizontal Canal Left Duration  15 sec    Horizontal Canal Left Symptoms  Nystagmus;Ageotrophic      Horizontal Canal Right Intensity   Horizontal Canal Right Intensity  Moderate    Right Intensity Comment  R geotrophic followed by delayed milder ageotrophic x ~15 sec      Horizontal Canal Left Intensity   Horizontal Canal Left Intensity  Mild                Vestibular Treatment/Exercise - 06/08/18 0759      Vestibular Treatment/Exercise   Vestibular Treatment Provided  Habituation;Canalith Repositioning    Canalith Repositioning  Canal Roll Right    Habituation Exercises  Horizontal Roll   supine horizonal head turns  Canal Roll Right   Number of Reps   1    Overall Response   Improved Symptoms      Horizontal Roll   Number of Reps   3                 PT Long Term Goals - 06/06/18 1250      PT LONG TERM GOAL #1   Title  Pt will report no further instances of spinning dizzines upon rising from bed in the morning    Status  On-going      PT LONG TERM GOAL #2   Title  Pt will be independent with habituation exercises as indicated for HEP    Status  On-going      PT LONG TERM GOAL #3   Title  Pt will verbalize understanding of strategies to reduce fall risk in presence of dizziness    Status  On-going            Plan - 06/08/18 0814    Clinical Impression Statement  Brian Davidson reporting no episodes of spinning dizziness since last PT session  other than when attempting habituation exercises at home - notes that these left him feeling "lousy" (fatigue) until mid-afternoon after completing them in the morning, with Brandt-Daroff exercises seeming to be the one that made him feel worst especially to R. Retesting of R [employer] negative today, but positive Horizontal canal testing with geotrophic horizontal nystagmus to R and ageotrophic to L. Treated R horizontal canal with canalith repositioning via BBQ roll maneuver, but during manuever noted delayed onset of ageotrophic nystagmus following resolution of initial geotrophic nystagmus when head turned to R, potentially indicating cupulolithiasis. Pt also assessed for orthostatic hypotension given ongoing c/o "lightheadedness" upon rising to sit which is different from the vertigo dizziness - mild drop in BP noted with each change of position associated with "lightheaded" feeling more pronounced with supine to sit than sit to stand, but below orthostatic threshold.    PT Treatment/Interventions  Patient/family education;Vestibular;Canalith Repostioning;Visual/perceptual remediation/compensation;Neuromuscular re-education;ADLs/Self Care Home Management    PT Next Visit Plan  reassess HEP, re-test horizontal canals     Consulted and Agree with Plan of Care  Patient       Patient will benefit from skilled therapeutic intervention in order to improve the following deficits and impairments:  Dizziness, Postural dysfunction, Impaired vision/preception  Visit Diagnosis: Dizziness and giddiness  BPPV (benign paroxysmal positional vertigo), bilateral  Unsteadiness on feet     Problem List Patient Active Problem List   Diagnosis Date Noted  . Multiple closed fractures of ribs of right side 01/21/2018  . URI (upper respiratory infection) 01/21/2018  . Thiamine deficiency 01/19/2018  . Peripheral neuropathy 07/13/2017  . Dyslipidemia 07/13/2017  . Preventative health care 01/11/2017  .  Carotid artery disease ([place]) 07/01/2016  . Valvular heart disease 07/01/2016  . Back pain 12/31/2015  . Osteopenia 12/31/2015  . Allergic state 12/31/2015  . Skin lesion 12/31/2015  . History of chicken pox   . Calculi, ureter 03/30/2015  . Arthritis of knee, degenerative 05/10/2014  . Pacemaker-St.Jude 02/01/2012  . Permanent atrial fibrillation   . Hypertension   . Heart murmur   . Chronic anticoagulation     Percival Spanish, PT, MPT 06/08/2018, 12:27 PM  [facility] [address]  [place], [place], [address] Phone: [phone]   Fax:  [phone]  Name: Brian Davidson MRN: [id number] Date of  Birth: [birth year]

## 2018-06-10 ENCOUNTER — Other Ambulatory Visit: Payer: Self-pay | Admitting: Family Medicine

## 2018-06-13 ENCOUNTER — Encounter: Payer: Self-pay | Admitting: Physical Therapy

## 2018-06-13 ENCOUNTER — Ambulatory Visit: Payer: PPO | Admitting: Physical Therapy

## 2018-06-13 DIAGNOSIS — R42 Dizziness and giddiness: Secondary | ICD-10-CM | POA: Diagnosis not present

## 2018-06-13 DIAGNOSIS — H8113 Benign paroxysmal vertigo, bilateral: Secondary | ICD-10-CM

## 2018-06-13 DIAGNOSIS — R2681 Unsteadiness on feet: Secondary | ICD-10-CM

## 2018-06-13 NOTE — Therapy (Signed)
[facility] [address]  [place], [place], [address] Phone: [phone]   Fax:  [phone]  Physical Therapy Treatment  Patient Details  Name: Brian Davidson MRN: [id number] Date of Birth: 1936-04-20 Referring Provider (PT): Mackie Pai, [place]   Encounter Date: 06/13/2018  PT End of Session - 06/13/18 0847    Visit Number  4    Number of Visits  8    Date for PT Re-Evaluation  06/28/18    Authorization Type  [employer]    PT Start Time  0758    PT Stop Time  0846    PT Time Calculation (min)  48 min    Activity Tolerance  Patient tolerated treatment well    Behavior During Therapy  [facility] for tasks assessed/performed       Past Medical History:  Diagnosis Date  . Arthritis   . Biliary dyskinesia   . Carotid artery disease ([place]) 07/01/2016  . Dyslipidemia 07/13/2017  . GERD (gastroesophageal reflux disease)   . H/O measles   . History of chicken pox   . Hypertension   . Kidney stone 03/2015  . Persistent atrial fibrillation   . Pneumonia 1992  . Tachycardia-bradycardia ([place])    a. s/p STJ dual chamber pacemaker  . Thiamine deficiency 01/19/2018  . Valvular heart disease 07/01/2016    Past Surgical History:  Procedure Laterality Date  . CHOLECYSTECTOMY N/A 11/24/2015   Procedure: LAPAROSCOPIC CHOLECYSTECTOMY;  Surgeon: Greer Pickerel, MD;  Location: [facility];  Service: General;  Laterality: N/A;  . CYSTOSCOPY  2016  . FRACTURE SURGERY  2005   left ankle  . HERNIA REPAIR     left groin, no mesh  . PERMANENT PACEMAKER INSERTION N/A 01/31/2012   SJM Accent SR RF implanted by DR Allred    There were no vitals filed for this visit.  Subjective Assessment - 06/13/18 0803    Subjective  Reports he thought he was getting better. Did have 2 episodes of spinning sensation when leaning forward and another time when getting gas. Having more spinning when turning head to the R with horizontal rotation  exercises.     Patient Stated Goals  "to not be dizzy when changing positions"    Currently in Pain?  No/denies          Treatment:   VOR R/L x10- no dizziness; cues to increase speed  VOR up/down x10- no dizziness; cues to increase speed  VBI testing: negative on B sides   R roll test- negative for symptoms and nystagmus   L roll test- positive with ageotropic nystagmus; possibly indicating cupulolithiasis   Horizontal Liberatory maneuver- 3 cycles; Geostrophic nystagmus to L; 10 sec latency and ageotropic nystagmus to R  R BBQ roll- ageotropic nystagmus on last turn into R sidelying lasting ~5 minutes; patient denied numbness, dysphagia, diplopia, lightheadedness; mild L beating nystagmus on return to sit lasting 30 sec                          PT Long Term Goals - 06/06/18 1250      PT LONG TERM GOAL #1   Title  Pt will report no further instances of spinning dizzines upon rising from bed in the morning    Status  On-going      PT LONG TERM GOAL #2   Title  Pt will be independent with habituation exercises as indicated for HEP  Status  On-going      PT LONG TERM GOAL #3   Title  Pt will verbalize understanding of strategies to reduce fall risk in presence of dizziness    Status  On-going            Plan - 06/13/18 0847    Clinical Impression Statement  Patient arrived to session with report that dizziness while sleeping had improved, however still with spinning sensation when leaning forward and to the R. Tested VBI which was negative to B sides. Patient with good gaze stability and no symptoms with VOR up/down and R/L. Tested R and L roll test which was initially negative on R, positive on L with ageotropic nystagmus- possibly indicating cupulolithiasis. Proceeded with Horizontal Liberatory Maneuver which caused onset of geotropic nystagmus to L, ageotropic nystagmus to R. Due to symptoms being stronger to R, proceeded with R BBQ. Patient  with ageotropic nystagmus during last turn in R sidelying with nystagmus and dizziness lasting ~5 minutes. Denied numbness, dysphagia, diplopia, lightheadedness. Patient also with mild L beating nystagmus on return to sit lasting 30 sec. Reported no dizziness after sitting rest break. Advised patient to take Meclizine as directed by MD if acute onset of prolonged dizziness occurs again. Patient reported understanding. Recommend transfer to Vestibular Specialist at this time d/t complicated nature of patient's symptoms with little improvement for the last 3 weeks.     PT Treatment/Interventions  Patient/family education;Vestibular;Canalith Repostioning;Visual/perceptual remediation/compensation;Neuromuscular re-education;ADLs/Self Care Home Management    Consulted and Agree with Plan of Care  Patient       Patient will benefit from skilled therapeutic intervention in order to improve the following deficits and impairments:  Dizziness, Postural dysfunction, Impaired vision/preception  Visit Diagnosis: Dizziness and giddiness  BPPV (benign paroxysmal positional vertigo), bilateral  Unsteadiness on feet     Problem List Patient Active Problem List   Diagnosis Date Noted  . Multiple closed fractures of ribs of right side 01/21/2018  . URI (upper respiratory infection) 01/21/2018  . Thiamine deficiency 01/19/2018  . Peripheral neuropathy 07/13/2017  . Dyslipidemia 07/13/2017  . Preventative health care 01/11/2017  . Carotid artery disease ([place]) 07/01/2016  . Valvular heart disease 07/01/2016  . Back pain 12/31/2015  . Osteopenia 12/31/2015  . Allergic state 12/31/2015  . Skin lesion 12/31/2015  . History of chicken pox   . Calculi, ureter 03/30/2015  . Arthritis of knee, degenerative 05/10/2014  . Pacemaker-St.Jude 02/01/2012  . Permanent atrial fibrillation   . Hypertension   . Heart murmur   . Chronic anticoagulation     Janene Harvey, PT, DPT 06/13/18 9:00 AM   [facility] [address]  [place], [place], [address] Phone: [phone]   Fax:  [phone]  Name: Brian Davidson MRN: [id number] Date of Birth: 06/23/1936

## 2018-06-15 ENCOUNTER — Encounter: Payer: PPO | Admitting: Physical Therapy

## 2018-06-20 ENCOUNTER — Ambulatory Visit: Payer: PPO | Admitting: Rehabilitative and Restorative Service Providers"

## 2018-06-20 ENCOUNTER — Encounter: Payer: Self-pay | Admitting: Rehabilitative and Restorative Service Providers"

## 2018-06-20 DIAGNOSIS — R42 Dizziness and giddiness: Secondary | ICD-10-CM

## 2018-06-20 DIAGNOSIS — H8113 Benign paroxysmal vertigo, bilateral: Secondary | ICD-10-CM

## 2018-06-20 DIAGNOSIS — R2681 Unsteadiness on feet: Secondary | ICD-10-CM

## 2018-06-20 NOTE — Patient Instructions (Signed)
Habituation - Tip Card  1.The goal of habituation training is to assist in decreasing symptoms of vertigo, dizziness, or nausea provoked by specific head and body motions. 2.These exercises may initially increase symptoms; however, be persistent and work through symptoms. With repetition and time, the exercises will assist in reducing or eliminating symptoms. 3.Exercises should be stopped and discussed with the therapist if you experience any of the following: - Sudden change or fluctuation in hearing - New onset of ringing in the ears, or increase in current intensity - Any fluid discharge from the ear - Severe pain in neck or back - Extreme nausea  Copyright  VHI. All rights reserved.   Habituation - Rolling   With pillow under head, start on back. Roll to your right side.  Hold until dizziness stops, plus 20 seconds and then roll to the left side.  Hold until dizziness stops, plus 20 seconds.  Repeat sequence 5 times per session. Do 3 sessions per day.  Copyright  VHI. All rights reserved.

## 2018-06-20 NOTE — Therapy (Signed)
[facility] [address] [place], [place], [address] Phone: [phone]   Fax:  [phone]  Physical Therapy Treatment  Patient Details  Name: Brian Davidson MRN: [id number] Date of Birth: [birth year] Referring Provider (PT): Mackie Pai, [place]   Encounter Date: 06/20/2018  PT End of Session - 06/20/18 1410    Visit Number  5    Number of Visits  8    Date for PT Re-Evaluation  06/28/18    Authorization Type  [employer]    PT Start Time  1315    PT Stop Time  1405    PT Time Calculation (min)  50 min    Activity Tolerance  Patient tolerated treatment well    Behavior During Therapy  [facility] for tasks assessed/performed       Past Medical History:  Diagnosis Date  . Arthritis   . Biliary dyskinesia   . Carotid artery disease ([place]) 07/01/2016  . Dyslipidemia 07/13/2017  . GERD (gastroesophageal reflux disease)   . H/O measles   . History of chicken pox   . Hypertension   . Kidney stone 03/2015  . Persistent atrial fibrillation   . Pneumonia 1992  . Tachycardia-bradycardia ([place])    a. s/p STJ dual chamber pacemaker  . Thiamine deficiency 01/19/2018  . Valvular heart disease 07/01/2016    Past Surgical History:  Procedure Laterality Date  . CHOLECYSTECTOMY N/A 11/24/2015   Procedure: LAPAROSCOPIC CHOLECYSTECTOMY;  Surgeon: Greer Pickerel, MD;  Location: [facility];  Service: General;  Laterality: N/A;  . CYSTOSCOPY  2016  . FRACTURE SURGERY  2005   left ankle  . HERNIA REPAIR     left groin, no mesh  . PERMANENT PACEMAKER INSERTION N/A 01/31/2012   SJM Accent SR RF implanted by DR Allred    There were no vitals filed for this visit.  Subjective Assessment - 06/20/18 1317    Subjective  The patient reports symptoms are somewhat better, but he can still feel dizziness when rolling in bed or looking towards the floor.    He has also had it when he looks up to finish the end of a drinik.  "It's not like  the first time I had it."  At onset, the patient reports constant sensation of room spinning "I could walk, but I had to hold the walls" it lasted x 1/2 a day.    Symptoms appear worse to the right side.      Patient Stated Goals  "to not be dizzy when changing positions"    Currently in Pain?  No/denies             Vestibular Assessment - 06/20/18 1319      Vestibular Assessment   General Observation  The patient continues with intermittent spells of dizziness worse with rolling.        Symptom Behavior   Type of Dizziness  Spinning    Frequency of Dizziness  daily, rolling in bed    Duration of Dizziness  seconds    Aggravating Factors  Rolling to right    Relieving Factors  Head stationary      Occulomotor Exam   Occulomotor Alignment  Abnormal   R eye hypertropia   Spontaneous  Absent    Gaze-induced  Absent    Smooth Pursuits  Intact    Saccades  Intact      Vestibulo-Occular Reflex   Comment  Head impulse test is + bilaterally with R side a  greater amplitude refixation saccade than the left side.  Patient gets mild lightheadedness with testing.      Positional Testing   Dix-Hallpike  Dix-Hallpike Right;Dix-Hallpike Left    Horizontal Canal Testing  Horizontal Canal Right;Horizontal Canal Left      Dix-Hallpike Right   Dix-Hallpike Right Duration  0    Dix-Hallpike Right Symptoms  No nystagmus      Dix-Hallpike Left   Dix-Hallpike Left Duration  10 seconds    Dix-Hallpike Left Symptoms  --   geotropic in direction (from horizontal canal)     Horizontal Canal Right   Horizontal Canal Right Duration  30 seconds    Horizontal Canal Right Symptoms  Geotrophic   large amplitude     Horizontal Canal Left   Horizontal Canal Left Duration  45 seconds    Horizontal Canal Left Symptoms  Geotrophic   worse subjectively to the left              [facility] Adult PT Treatment/Exercise - 06/20/18 1421      Self-Care   Self-Care  Other Self-Care Comments     Other Self-Care Comments   Discussed goals of canolith repositioning versus habituation.  Inquired about other functional deficits related to dizziness:  patient notes bending to get something from under the couch, reaching to put shoes on, wiping up the floor increase symptoms.      Vestibular Treatment/Exercise - 06/20/18 1331      Vestibular Treatment/Exercise   Vestibular Treatment Provided  Habituation;Canalith Repositioning    Canalith Repositioning  Canal Roll Left    Habituation Exercises  Horizontal Roll    Gaze Exercises  --      Canal Roll Left   Number of Reps   2    Overall Response   Improved Symptoms    Response Details   After completing 2 reps, PT reassessed and a mild nystagmus is still viewed in room light to both sides.        Horizontal Roll   Number of Reps   3    Symptom Description   Recommended for HEP x 5 reps 3 times/day.                 PT Long Term Goals - 06/20/18 1415      PT LONG TERM GOAL #1   Title  Pt will report no further instances of spinning dizzines upon rising from bed in the morning    Status  On-going    Target Date  06/28/18      PT LONG TERM GOAL #2   Title  Pt will be independent with habituation exercises as indicated for HEP    Status  Achieved      PT LONG TERM GOAL #3   Title  Pt will verbalize understanding of strategies to reduce fall risk in presence of dizziness    Status  On-going            Plan - 06/20/18 1422    Clinical Impression Statement  The patient continues with persistent BPPV with geotropic nystagmus indicating horizontal canalithiasis.   Differentiating sides is challenging for this patient as nystagmus duration was initially more intense and greater duration on the left side and by the end of session was more intense on the right side.  PT attempted to use Bow and Lean test to help differentiate side to treat, however no nystagmus present in either position.   Possible factors that may be  present  are:  bilateral horizontal canalithiasis (PT will continue to treat side of greatest intensity for geotropic nystagmus), or recurring BPPV in which we treat one side and this dislodges otoconia on the contralateral side.   PT recommended the patient perform horizontal rolling for habituation x 10 days before returning to the clinic.  Also identified Positive head impulse testing greater to the right side than the left and will address VOR deficits if still present after further treatment for BPPV.     PT Treatment/Interventions  Patient/family education;Vestibular;Canalith Repostioning;Visual/perceptual remediation/compensation;Neuromuscular re-education;ADLs/Self Care Home Management    PT Next Visit Plan  reassess positional testing, treat as indicated, VOR if needed once BPPV resolved.    Consulted and Agree with Plan of Care  Patient       Patient will benefit from skilled therapeutic intervention in order to improve the following deficits and impairments:  Dizziness, Postural dysfunction, Impaired vision/preception  Visit Diagnosis: BPPV (benign paroxysmal positional vertigo), bilateral  Dizziness and giddiness  Unsteadiness on feet     Problem List Patient Active Problem List   Diagnosis Date Noted  . Multiple closed fractures of ribs of right side 01/21/2018  . URI (upper respiratory infection) 01/21/2018  . Thiamine deficiency 01/19/2018  . Peripheral neuropathy 07/13/2017  . Dyslipidemia 07/13/2017  . Preventative health care 01/11/2017  . Carotid artery disease ([place]) 07/01/2016  . Valvular heart disease 07/01/2016  . Back pain 12/31/2015  . Osteopenia 12/31/2015  . Allergic state 12/31/2015  . Skin lesion 12/31/2015  . History of chicken pox   . Calculi, ureter 03/30/2015  . Arthritis of knee, degenerative 05/10/2014  . Pacemaker-St.Jude 02/01/2012  . Permanent atrial fibrillation   . Hypertension   . Heart murmur   . Chronic anticoagulation      Melessa Cowell, PT 06/20/2018, 2:30 PM  [facility] [address] [place], [place], [address] Phone: [phone]   Fax:  [phone]  Name: EDENILSON AUSTAD MRN: [id number] Date of Birth: [birth year]

## 2018-06-28 LAB — CUP PACEART REMOTE DEVICE CHECK
Brady Statistic RV Percent Paced: 10 %
Date Time Interrogation Session: 20191001074232
Implantable Lead Implant Date: 20130528
Implantable Lead Location: 753860
Lead Channel Impedance Value: 630 Ohm
Lead Channel Pacing Threshold Amplitude: 0.75 V
Lead Channel Sensing Intrinsic Amplitude: 12 mV
Lead Channel Setting Pacing Amplitude: 2.5 V
Lead Channel Setting Sensing Sensitivity: 2 mV
MDC IDC MSMT BATTERY REMAINING LONGEVITY: 136 mo
MDC IDC MSMT BATTERY REMAINING PERCENTAGE: 95.5 %
MDC IDC MSMT BATTERY VOLTAGE: 2.96 V
MDC IDC MSMT LEADCHNL RV PACING THRESHOLD PULSEWIDTH: 0.4 ms
MDC IDC PG IMPLANT DT: 20130528
MDC IDC SET LEADCHNL RV PACING PULSEWIDTH: 0.4 ms
Pulse Gen Serial Number: 7328888

## 2018-06-29 ENCOUNTER — Ambulatory Visit: Payer: PPO | Admitting: Rehabilitative and Restorative Service Providers"

## 2018-06-29 DIAGNOSIS — H8113 Benign paroxysmal vertigo, bilateral: Secondary | ICD-10-CM

## 2018-06-29 DIAGNOSIS — R42 Dizziness and giddiness: Secondary | ICD-10-CM

## 2018-06-29 DIAGNOSIS — R2681 Unsteadiness on feet: Secondary | ICD-10-CM

## 2018-06-29 NOTE — Therapy (Addendum)
[facility] [address] [place], [place], [address] Phone: [phone]   Fax:  [phone]            Physical Therapy Treatment and Discharge Summary Patient Details  Name: Brian Davidson MRN: [id number] Date of Birth: 1935/10/09 Referring Provider (PT): Mackie Pai, [place]   Encounter Date: 06/29/2018  PT End of Session - 06/29/18 1358    Visit Number  6    Number of Visits  8    Date for PT Re-Evaluation  06/28/18    Authorization Type  [employer]    PT Start Time  2248    PT Stop Time  1355    PT Time Calculation (min)  32 min    Activity Tolerance  Patient tolerated treatment well    Behavior During Therapy  [facility] for tasks assessed/performed       Past Medical History:  Diagnosis Date  . Arthritis   . Biliary dyskinesia   . Carotid artery disease ([place]) 07/01/2016  . Dyslipidemia 07/13/2017  . GERD (gastroesophageal reflux disease)   . H/O measles   . History of chicken pox   . Hypertension   . Kidney stone 03/2015  . Persistent atrial fibrillation   . Pneumonia 1992  . Tachycardia-bradycardia ([place])    a. s/p STJ dual chamber pacemaker  . Thiamine deficiency 01/19/2018  . Valvular heart disease 07/01/2016    Past Surgical History:  Procedure Laterality Date  . CHOLECYSTECTOMY N/A 11/24/2015   Procedure: LAPAROSCOPIC CHOLECYSTECTOMY;  Surgeon: Greer Pickerel, MD;  Location: [facility];  Service: General;  Laterality: N/A;  . CYSTOSCOPY  2016  . FRACTURE SURGERY  2005   left ankle  . HERNIA REPAIR     left groin, no mesh  . PERMANENT PACEMAKER INSERTION N/A 01/31/2012   SJM Accent SR RF implanted by DR Allred    There were no vitals filed for this visit.  Subjective Assessment - 06/29/18 1325    Subjective  The patient reports exercise is going well and he is doing 2 x/day.  Iniitally he has dizziness on both sides.  On Saturday or Sunday, it quit on the right.  On the left, he has some mild  symptoms to the left on first rep.    He notes a touch of "lightheadedness" when he first transitions to standing.     Patient Stated Goals  "to not be dizzy when changing positions"    Currently in Pain?  No/denies             Vestibular Assessment - 06/29/18 1326      Vestibular Assessment   General Observation  Patient reports 90-95% better "I could live with what I've got now."  Paitent has only taken meclizine one day this week.      Positional Testing   Horizontal Canal Testing  Horizontal Canal Right;Horizontal Canal Left      Horizontal Canal Right   Horizontal Canal Right Duration  0    Horizontal Canal Right Symptoms  Normal      Horizontal Canal Left   Horizontal Canal Left Duration  0    Horizontal Canal Left Symptoms  Normal      Positional Sensitivities   Positional Sensitivities Comments  lying supine notes a sensation of mild, low amplitude movement (?possible R beating nystagmus noted).       Orthostatics   BP supine (x 5 minutes)  160/90    HR supine (x 5 minutes)  75    BP standing (after 1 minute)  130/90    BP standing (after 3 minutes)  130/90               [facility] Adult PT Treatment/Exercise - 06/29/18 1559      Neuro Re-ed    Neuro Re-ed Details   The patient performed standing in corner with eyes closed and feet apart with CGA.  PT recommended help during HEP.       Vestibular Treatment/Exercise - 06/29/18 1340      Vestibular Treatment/Exercise   Vestibular Treatment Provided  Gaze    Gaze Exercises  X1 Viewing Horizontal      X1 Viewing Horizontal   Foot Position  seated progressing to standing - used fingertip touch on chair back due to increased sway    Comments  30 seconds horizontal plane x 3 reps            PT Education - 06/29/18 1557    Education Details  added vor x 1 and standing on foam + eyes closed    Person(s) Educated  Patient    Methods  Explanation;Demonstration;Handout    Comprehension  Verbalized  understanding;Returned demonstration          PT Long Term Goals - 06/29/18 1332      PT LONG TERM GOAL #1   Title  Pt will report no further instances of spinning dizzines upon rising from bed in the morning    Baseline  Still notes a sensation of mild "lightheadedness" when transitioning to stand.    Status  Achieved    Target Date  06/28/18      PT LONG TERM GOAL #2   Title  Pt will be independent with habituation exercises as indicated for HEP    Status  Achieved      PT LONG TERM GOAL #3   Title  Pt will verbalize understanding of strategies to reduce fall risk in presence of dizziness    Status  Achieved      PT LONG TERM GOAL #4   Title  PT to f/u one more visit to ensure HEP progressing well.    Time  4    Period  Weeks    Status  New    Target Date  07/29/18            Plan - 06/29/18 1559    Clinical Impression Statement  The patient did not have any nystagmus today with positional testing, but does note occasional episodes of sensation of small movement of room when moving into supine.  The patient is to continue HEP and f/u with PT in 3 weeks if any symptoms remain.  We addressed vestibular input use during balance activities and with gaze x 1 adaptation.     PT Treatment/Interventions  Patient/family education;Vestibular;Canalith Repostioning;Visual/perceptual remediation/compensation;Neuromuscular re-education;ADLs/Self Care Home Management    PT Next Visit Plan  Check HEP, check positional testing, d/c if going well.    Consulted and Agree with Plan of Care  Patient       Patient will benefit from skilled therapeutic intervention in order to improve the following deficits and impairments:  Dizziness, Postural dysfunction, Impaired vision/preception  Visit Diagnosis: BPPV (benign paroxysmal positional vertigo), bilateral  Dizziness and giddiness  Unsteadiness on feet     Problem List Patient Active Problem List   Diagnosis Date Noted  .  Multiple closed fractures of ribs of right side 01/21/2018  . URI (upper respiratory infection)  01/21/2018  . Thiamine deficiency 01/19/2018  . Peripheral neuropathy 07/13/2017  . Dyslipidemia 07/13/2017  . Preventative health care 01/11/2017  . Carotid artery disease ([place]) 07/01/2016  . Valvular heart disease 07/01/2016  . Back pain 12/31/2015  . Osteopenia 12/31/2015  . Allergic state 12/31/2015  . Skin lesion 12/31/2015  . History of chicken pox   . Calculi, ureter 03/30/2015  . Arthritis of knee, degenerative 05/10/2014  . Pacemaker-St.Jude 02/01/2012  . Permanent atrial fibrillation   . Hypertension   . Heart murmur   . Chronic anticoagulation      PHYSICAL THERAPY DISCHARGE SUMMARY  Visits from Start of Care: 6   Current functional level related to goals / functional outcomes: See above   Remaining deficits: Patient feels he can continue HEP/ he was planning to call if in need of further treatment/ no call.   Education / Equipment: HEP  Plan: Patient agrees to discharge.  Patient goals were met. Patient is being discharged due to meeting the stated rehab goals.  ?????        Thank you for the referral of this patient. Rudell Cobb, MPT   [place] , PT 06/29/2018, 4:01 PM  [facility] [address] [place], [place], [address] Phone: [phone]   Fax:  [phone]  Name: MARIA GALLICCHIO MRN: [id number] Date of Birth: [birth year]

## 2018-06-29 NOTE — Patient Instructions (Signed)
Habituation - Tip Card  1.The goal of habituation training is to assist in decreasing symptoms of vertigo, dizziness, or nausea provoked by specific head and body motions. 2.These exercises may initially increase symptoms; however, be persistent and work through symptoms. With repetition and time, the exercises will assist in reducing or eliminating symptoms. 3.Exercises should be stopped and discussed with the therapist if you experience any of the following: - Sudden change or fluctuation in hearing - New onset of ringing in the ears, or increase in current intensity - Any fluid discharge from the ear - Severe pain in neck or back - Extreme nausea  Copyright  VHI. All rights reserved.   Habituation - Rolling   With pillow under head, start on back. Roll to your right side.  Hold until dizziness stops, plus 20 seconds and then roll to the left side.  Hold until dizziness stops, plus 20 seconds.  Repeat sequence 5 times per session. Do 3 sessions per day.  STOP DOING EXERCISE WHEN YOU HAVE 2 DAYS WITHOUT DIZZINESS WHEN DOING EXERCISE.   Copyright  VHI. All rights reserved.    Gaze Stabilization - Tip Card  1.Target must remain in focus, not blurry, and appear stationary while head is in motion. 2.Perform exercises with small head movements (45 to either side of midline). 3.Increase speed of head motion so long as target is in focus. 4.If you wear eyeglasses, be sure you can see target through lens (therapist will give specific instructions for bifocal / progressive lenses). 5.These exercises may provoke dizziness or nausea. Work through these symptoms. If too dizzy, slow head movement slightly. Rest between each exercise. 6.Exercises demand concentration; avoid distractions. 7.For safety, perform standing exercises close to a counter, wall, corner, or next to someone.  Copyright  VHI. All rights reserved.   Gaze Stabilization - Standing Feet Apart   Feet shoulder width apart,  keeping eyes on target on wall 3 feet away, tilt head down slightly and move head side to side for 30 seconds.  *Work up to tolerating 60 seconds, as able. Do 2 sessions per day.  Near a chair for support, try to reduce support through chair as able.   Copyright  VHI. All rights reserved.   Feet Apart (Compliant Surface) Varied Arm Positions - Eyes Closed    Stand on compliant surface: ___PILLOW_____ with feet shoulder width apart and arms at your sides. Close eyes and visualize upright position. Hold__30__ seconds. Repeat __3__ times per session. Do __2__ sessions per day.  Have your wife stand nearby for support and a chair in front for support.  Copyright  VHI. All rights reserved.

## 2018-07-05 ENCOUNTER — Ambulatory Visit: Payer: PPO | Admitting: Sports Medicine

## 2018-07-20 ENCOUNTER — Ambulatory Visit: Payer: PPO | Admitting: Rehabilitative and Restorative Service Providers"

## 2018-07-23 ENCOUNTER — Other Ambulatory Visit (INDEPENDENT_AMBULATORY_CARE_PROVIDER_SITE_OTHER): Payer: PPO

## 2018-07-23 DIAGNOSIS — E785 Hyperlipidemia, unspecified: Secondary | ICD-10-CM | POA: Diagnosis not present

## 2018-07-23 DIAGNOSIS — E519 Thiamine deficiency, unspecified: Secondary | ICD-10-CM | POA: Diagnosis not present

## 2018-07-23 DIAGNOSIS — R739 Hyperglycemia, unspecified: Secondary | ICD-10-CM | POA: Diagnosis not present

## 2018-07-23 DIAGNOSIS — I10 Essential (primary) hypertension: Secondary | ICD-10-CM | POA: Diagnosis not present

## 2018-07-23 LAB — HEMOGLOBIN A1C: Hgb A1c MFr Bld: 5.8 % (ref 4.6–6.5)

## 2018-07-23 LAB — COMPREHENSIVE METABOLIC PANEL
ALT: 13 U/L (ref 0–53)
AST: 17 U/L (ref 0–37)
Albumin: 3.7 g/dL (ref 3.5–5.2)
Alkaline Phosphatase: 63 U/L (ref 39–117)
BILIRUBIN TOTAL: 0.9 mg/dL (ref 0.2–1.2)
BUN: 21 mg/dL (ref 6–23)
CALCIUM: 8.9 mg/dL (ref 8.4–10.5)
CO2: 28 meq/L (ref 19–32)
Chloride: 105 mEq/L (ref 96–112)
Creatinine, Ser: 1.32 mg/dL (ref 0.40–1.50)
GFR: 55.35 mL/min — ABNORMAL LOW (ref >60.00)
GLUCOSE: 95 mg/dL (ref 70–99)
POTASSIUM: 3.9 meq/L (ref 3.5–5.1)
Sodium: 141 mEq/L (ref 135–145)
Total Protein: 6.4 g/dL (ref 6.0–8.3)

## 2018-07-23 LAB — CBC
HEMATOCRIT: 43 % (ref 39.0–52.0)
HEMOGLOBIN: 14.4 g/dL (ref 13.0–17.0)
MCHC: 33.5 g/dL (ref 30.0–36.0)
MCV: 89.8 fl (ref 78.0–100.0)
PLATELETS: 143 10*3/uL — AB (ref 150.0–400.0)
RBC: 4.79 Mil/uL (ref 4.22–5.81)
RDW: 14.4 % (ref 11.5–15.5)
WBC: 6.2 10*3/uL (ref 4.0–10.5)

## 2018-07-23 LAB — LIPID PANEL
CHOL/HDL RATIO: 4
Cholesterol: 157 mg/dL (ref 0–200)
HDL: 36.2 mg/dL — ABNORMAL LOW (ref >39.00)
LDL Cholesterol: 99 mg/dL (ref 0–99)
NonHDL: 120.37
TRIGLYCERIDES: 106 mg/dL (ref 0.0–149.0)
VLDL: 21.2 mg/dL (ref 0.0–40.0)

## 2018-07-23 LAB — TSH: TSH: 2 u[IU]/mL (ref 0.35–4.50)

## 2018-07-27 ENCOUNTER — Ambulatory Visit (HOSPITAL_BASED_OUTPATIENT_CLINIC_OR_DEPARTMENT_OTHER)
Admission: RE | Admit: 2018-07-27 | Discharge: 2018-07-27 | Disposition: A | Discharge status: Home / Self Care | Payer: PPO | Source: Ambulatory Visit | Attending: Family Medicine | Admitting: Family Medicine

## 2018-07-27 ENCOUNTER — Ambulatory Visit (INDEPENDENT_AMBULATORY_CARE_PROVIDER_SITE_OTHER): Payer: PPO | Admitting: Family Medicine

## 2018-07-27 ENCOUNTER — Encounter: Payer: Self-pay | Admitting: Family Medicine

## 2018-07-27 VITALS — BP 130/62 | HR 65 | Temp 97.7°F | Resp 18 | Ht 72.0 in | Wt 226.4 lb

## 2018-07-27 DIAGNOSIS — I10 Essential (primary) hypertension: Secondary | ICD-10-CM

## 2018-07-27 DIAGNOSIS — R42 Dizziness and giddiness: Secondary | ICD-10-CM | POA: Insufficient documentation

## 2018-07-27 DIAGNOSIS — M1711 Unilateral primary osteoarthritis, right knee: Secondary | ICD-10-CM | POA: Diagnosis not present

## 2018-07-27 DIAGNOSIS — E785 Hyperlipidemia, unspecified: Secondary | ICD-10-CM

## 2018-07-27 DIAGNOSIS — R739 Hyperglycemia, unspecified: Secondary | ICD-10-CM

## 2018-07-27 DIAGNOSIS — E519 Thiamine deficiency, unspecified: Secondary | ICD-10-CM

## 2018-07-27 DIAGNOSIS — M25561 Pain in right knee: Secondary | ICD-10-CM | POA: Diagnosis not present

## 2018-07-27 NOTE — Assessment & Plan Note (Signed)
Encouraged heart healthy diet, increase exercise, avoid trans fats, consider a krill oil cap daily 

## 2018-07-27 NOTE — Assessment & Plan Note (Signed)
Continue to supplement and monitor 

## 2018-07-27 NOTE — Assessment & Plan Note (Signed)
Right knee pain and swelling have escalated. Was scoped in past. Will xray and refer to orthopaedics

## 2018-07-27 NOTE — Assessment & Plan Note (Signed)
Struggled for q good bit this year but is resolved after PT. Hydration and protein are encouraged

## 2018-07-27 NOTE — Progress Notes (Signed)
Subjective:    Patient ID: Brian Davidson, male    DOB: [birth year], 82 y.o.   MRN: [id number]  No chief complaint on file.   HPI Patient is in today for follow-up.  Overall he is doing well.  He did recently have one episode of vertigo but it passed without significant medication need.  He had no headache tinnitus or trauma associated.  He continues to struggle with bilateral knee pain and notes his right knee has been more painful and swollen recently.  No recent febrile illness or hospitalization. Denies CP/palp/SOB/HA/congestion/fevers/GI or GU c/o. Taking meds as prescribed   Past Medical History:  Diagnosis Date  . Arthritis   . Biliary dyskinesia   . Carotid artery disease ([place]) 07/01/2016  . Dyslipidemia 07/13/2017  . GERD (gastroesophageal reflux disease)   . H/O measles   . History of chicken pox   . Hypertension   . Kidney stone 03/2015  . Persistent atrial fibrillation   . Pneumonia 1992  . Tachycardia-bradycardia ([place])    a. s/p STJ dual chamber pacemaker  . Thiamine deficiency 01/19/2018  . Valvular heart disease 07/01/2016    Past Surgical History:  Procedure Laterality Date  . CHOLECYSTECTOMY N/A 11/24/2015   Procedure: LAPAROSCOPIC CHOLECYSTECTOMY;  Surgeon: Greer Pickerel, MD;  Location: [facility];  Service: General;  Laterality: N/A;  . CYSTOSCOPY  2016  . FRACTURE SURGERY  2005   left ankle  . HERNIA REPAIR     left groin, no mesh  . PERMANENT PACEMAKER INSERTION N/A 01/31/2012   SJM Accent SR RF implanted by DR Allred    Family History  Problem Relation Age of Onset  . Hypertension Mother   . Arthritis Mother   . Drug abuse Mother   . Stroke Father   . Heart disease Father   . Heart disease Sister 1  . Heart disease Brother   . Hypertension Son   . Heart disease Sister   . Neuropathy Brother   . Hypertension Brother   . Atrial fibrillation Brother   . Atrial fibrillation Sister   . Colon cancer Neg Hx     Social History   Socioeconomic  History  . Marital status: Married    Spouse name: [name]  . Number of children: 1  . Years of education: Not on file  . Highest education level: Not on file  Occupational History  . Occupation: Retired  . Occupation: Dance movement psychotherapist for [employer]  . Financial resource strain: Not on file  . Food insecurity:    Worry: Not on file    Inability: Not on file  . Transportation needs:    Medical: Not on file    Non-medical: Not on file  Tobacco Use  . Smoking status: Former Smoker    Years: 10.00    Types: Pipe, Cigars    Last attempt to quit: 11/18/1985    Years since quitting: 32.7  . Smokeless tobacco: Never Used  Substance and Sexual Activity  . Alcohol use: Yes    Alcohol/week: 3.0 standard drinks    Types: 3 Cans of beer per week    Comment: occ  . Drug use: No  . Sexual activity: Never  Lifestyle  . Physical activity:    Days per week: Not on file    Minutes per session: Not on file  . Stress: Not on file  Relationships  . Social connections:    Talks on phone: Not on file  Gets together: Not on file    Attends religious service: Not on file    Active member of club or organization: Not on file    Attends meetings of clubs or organizations: Not on file    Relationship status: Not on file  . Intimate partner violence:    Fear of current or ex partner: Not on file    Emotionally abused: Not on file    Physically abused: Not on file    Forced sexual activity: Not on file  Other Topics Concern  . Not on file  Social History Narrative   Lives with wife, still works part-time as a Geophysicist/field seismologist. He is active around the house and yard...   Parents are deceased and did not have cardiac issues but he has 2 siblings with atrial fibrillation and a sister-in-law  has a pacemaker.    Outpatient Medications Prior to Visit  Medication Sig Dispense Refill  . diltiazem (CARDIZEM CD) 240 MG 24 hr capsule TAKE ONE (1) CAPSULE EACH DAY 90 capsule 3  . gabapentin  (NEURONTIN) 300 MG capsule Take 1-2 capsules (300-600 mg total) by mouth at bedtime. 60 capsule 5  . irbesartan-hydrochlorothiazide (AVALIDE) 150-12.5 MG tablet TAKE 1/2 (ONE-HALF) TABLET BY MOUTH ONCE DAILY 45 tablet 1  . loratadine (CLARITIN) 10 MG tablet Take 10 mg by mouth daily as needed. Reported on 12/31/2015    . meclizine (ANTIVERT) 12.5 MG tablet Take 1 tablet (12.5 mg total) by mouth 3 (three) times daily as needed for dizziness. 30 tablet 0  . metoprolol tartrate (LOPRESSOR) 100 MG tablet TAKE 1 TABLET BY MOUTH TWICE DAILY 180 tablet 3  . Turmeric 500 MG TABS Take by mouth.    Alveda Reasons 20 MG TABS tablet TAKE ONE TABLET BY MOUTH ONCE DAILY 90 tablet 3   No facility-administered medications prior to visit.     Allergies  Allergen Reactions  . Cardizem [Diltiazem]     Rash from Yellow dye in generic capsule. Can take different brand. Takes diltiazem - his allergy is to Cardizem  . Diltiazem Hcl     Rash from red dye in generic capsule. Can take different brand. Takes diltiazem - his allergy is to Cardizem Rash from red dye in generic capsule. Can take different brand. Takes diltiazem - his allergy is to Cardizem  . Sulfa Antibiotics     Unknown childhood reaction    Review of Systems  Constitutional: Negative for fever and malaise/fatigue.  HENT: Negative for congestion.   Eyes: Negative for blurred vision.  Respiratory: Negative for shortness of breath.   Cardiovascular: Positive for leg swelling. Negative for chest pain and palpitations.  Gastrointestinal: Negative for abdominal pain, blood in stool and nausea.  Genitourinary: Negative for dysuria and frequency.  Musculoskeletal: Positive for joint pain. Negative for falls.  Skin: Negative for rash.  Neurological: Negative for dizziness, loss of consciousness and headaches.  Endo/Heme/Allergies: Negative for environmental allergies.  Psychiatric/Behavioral: Negative for depression. The patient is not  nervous/anxious.        Objective:    Physical Exam  Constitutional: He is oriented to person, place, and time. He appears well-developed and well-nourished. No distress.  HENT:  Head: Normocephalic and atraumatic.  Nose: Nose normal.  Eyes: Right eye exhibits no discharge. Left eye exhibits no discharge.  Neck: Normal range of motion. Neck supple.  Cardiovascular: Normal rate and regular rhythm.  No murmur heard. Pulmonary/Chest: Effort normal and breath sounds normal.  Abdominal: Soft. Bowel sounds are normal. There  is no tenderness.  Musculoskeletal: He exhibits no edema.  Right knee swelling  Neurological: He is alert and oriented to person, place, and time.  Skin: Skin is warm and dry.  Psychiatric: He has a normal mood and affect.  Nursing note and vitals reviewed.   BP 130/62 (BP Location: Left Arm, Patient Position: Sitting, Cuff Size: Normal)   Pulse 65   Temp 97.7 F (36.5 C) (Oral)   Resp 18   Ht 6' (1.829 m)   Wt 226 lb 6.4 oz (102.7 kg)   SpO2 97%   BMI 30.71 kg/m  Wt Readings from Last 3 Encounters:  07/27/18 226 lb 6.4 oz (102.7 kg)  05/24/18 221 lb 12.8 oz (100.6 kg)  05/23/18 223 lb 12.8 oz (101.5 kg)     Lab Results  Component Value Date   WBC 6.2 07/23/2018   HGB 14.4 07/23/2018   HCT 43.0 07/23/2018   PLT 143.0 (L) 07/23/2018   GLUCOSE 95 07/23/2018   CHOL 157 07/23/2018   TRIG 106.0 07/23/2018   HDL 36.20 (L) 07/23/2018   LDLCALC 99 07/23/2018   ALT 13 07/23/2018   AST 17 07/23/2018   NA 141 07/23/2018   K 3.9 07/23/2018   CL 105 07/23/2018   CREATININE 1.32 07/23/2018   BUN 21 07/23/2018   CO2 28 07/23/2018   TSH 2.00 07/23/2018   INR 1.20 11/24/2015   HGBA1C 5.8 07/23/2018    Lab Results  Component Value Date   TSH 2.00 07/23/2018   Lab Results  Component Value Date   WBC 6.2 07/23/2018   HGB 14.4 07/23/2018   HCT 43.0 07/23/2018   MCV 89.8 07/23/2018   PLT 143.0 (L) 07/23/2018   Lab Results  Component Value Date    NA 141 07/23/2018   K 3.9 07/23/2018   CO2 28 07/23/2018   GLUCOSE 95 07/23/2018   BUN 21 07/23/2018   CREATININE 1.32 07/23/2018   BILITOT 0.9 07/23/2018   ALKPHOS 63 07/23/2018   AST 17 07/23/2018   ALT 13 07/23/2018   PROT 6.4 07/23/2018   ALBUMIN 3.7 07/23/2018   CALCIUM 8.9 07/23/2018   ANIONGAP 8 11/19/2015   GFR 55.35 (L) 07/23/2018   Lab Results  Component Value Date   CHOL 157 07/23/2018   Lab Results  Component Value Date   HDL 36.20 (L) 07/23/2018   Lab Results  Component Value Date   LDLCALC 99 07/23/2018   Lab Results  Component Value Date   TRIG 106.0 07/23/2018   Lab Results  Component Value Date   CHOLHDL 4 07/23/2018   Lab Results  Component Value Date   HGBA1C 5.8 07/23/2018       Assessment & Plan:   Problem List Items Addressed This Visit    Hypertension    Well controlled, no changes to meds. Encouraged heart healthy diet such as the DASH diet and exercise as tolerated.       Relevant Orders   CBC   TSH   Comprehensive metabolic panel   Arthritis of knee, degenerative    Right knee pain and swelling have escalated. Was scoped in past. Will xray and refer to orthopaedics      Relevant Orders   Ambulatory referral to Orthopedic Surgery   DG Knee Complete 4 Views Right   Dyslipidemia    Encouraged heart healthy diet, increase exercise, avoid trans fats, consider a krill oil cap daily      Relevant Orders   Lipid panel  Thiamine deficiency    Continue to supplement and monitor      Relevant Orders   Vitamin B1   Vertigo    Struggled for q good bit this year but is resolved after PT. Hydration and protein are encouraged       Other Visit Diagnoses    Hyperglycemia    -  Primary   Relevant Orders   Hemoglobin A1c      I am having [name] "[name]" maintain his loratadine, XARELTO, diltiazem, gabapentin, Turmeric, irbesartan-hydrochlorothiazide, meclizine, and metoprolol tartrate.  No orders of the defined  types were placed in this encounter.    [name], MD

## 2018-07-27 NOTE — Assessment & Plan Note (Signed)
Well controlled, no changes to meds. Encouraged heart healthy diet such as the DASH diet and exercise as tolerated.  °

## 2018-07-27 NOTE — Patient Instructions (Signed)

## 2018-07-28 LAB — VITAMIN B1: Vitamin B1 (Thiamine): 71 nmol/L — ABNORMAL HIGH (ref 8–30)

## 2018-08-16 DIAGNOSIS — I781 Nevus, non-neoplastic: Secondary | ICD-10-CM | POA: Diagnosis not present

## 2018-08-16 DIAGNOSIS — L853 Xerosis cutis: Secondary | ICD-10-CM | POA: Diagnosis not present

## 2018-08-16 DIAGNOSIS — X32XXXS Exposure to sunlight, sequela: Secondary | ICD-10-CM | POA: Diagnosis not present

## 2018-08-16 DIAGNOSIS — L814 Other melanin hyperpigmentation: Secondary | ICD-10-CM | POA: Diagnosis not present

## 2018-09-03 ENCOUNTER — Ambulatory Visit (INDEPENDENT_AMBULATORY_CARE_PROVIDER_SITE_OTHER): Payer: PPO

## 2018-09-03 DIAGNOSIS — I495 Sick sinus syndrome: Secondary | ICD-10-CM | POA: Diagnosis not present

## 2018-09-03 NOTE — Progress Notes (Signed)
Remote pacemaker transmission.   

## 2018-09-04 LAB — CUP PACEART REMOTE DEVICE CHECK
Battery Remaining Longevity: 136 mo
Battery Remaining Percentage: 95.5 %
Brady Statistic RV Percent Paced: 11 %
Implantable Lead Implant Date: 20130528
Implantable Lead Location: 753860
Implantable Pulse Generator Implant Date: 20130528
Lead Channel Impedance Value: 630 Ohm
Lead Channel Pacing Threshold Amplitude: 0.75 V
Lead Channel Pacing Threshold Pulse Width: 0.4 ms
Lead Channel Setting Pacing Amplitude: 2.5 V
Lead Channel Setting Sensing Sensitivity: 2 mV
MDC IDC MSMT BATTERY VOLTAGE: 2.96 V
MDC IDC MSMT LEADCHNL RV SENSING INTR AMPL: 12 mV
MDC IDC SESS DTM: 20191230081445
MDC IDC SET LEADCHNL RV PACING PULSEWIDTH: 0.4 ms
Pulse Gen Model: 1210
Pulse Gen Serial Number: 7328888

## 2018-09-06 ENCOUNTER — Ambulatory Visit (INDEPENDENT_AMBULATORY_CARE_PROVIDER_SITE_OTHER): Payer: PPO | Admitting: Medical

## 2018-09-06 ENCOUNTER — Ambulatory Visit (HOSPITAL_BASED_OUTPATIENT_CLINIC_OR_DEPARTMENT_OTHER)
Admission: RE | Admit: 2018-09-06 | Discharge: 2018-09-06 | Disposition: A | Discharge status: Home / Self Care | Payer: PPO | Source: Ambulatory Visit | Attending: Medical | Admitting: Medical

## 2018-09-06 ENCOUNTER — Telehealth: Payer: Self-pay | Admitting: Medical

## 2018-09-06 ENCOUNTER — Encounter: Payer: Self-pay | Admitting: Medical

## 2018-09-06 VITALS — BP 140/61 | HR 65 | Temp 98.5°F | Resp 16 | Ht 72.0 in | Wt 223.6 lb

## 2018-09-06 DIAGNOSIS — M545 Low back pain, unspecified: Secondary | ICD-10-CM

## 2018-09-06 DIAGNOSIS — I4891 Unspecified atrial fibrillation: Secondary | ICD-10-CM

## 2018-09-06 DIAGNOSIS — R000 Tachycardia, unspecified: Secondary | ICD-10-CM | POA: Diagnosis not present

## 2018-09-06 DIAGNOSIS — E785 Hyperlipidemia, unspecified: Secondary | ICD-10-CM

## 2018-09-06 DIAGNOSIS — M47817 Spondylosis without myelopathy or radiculopathy, lumbosacral region: Secondary | ICD-10-CM | POA: Diagnosis not present

## 2018-09-06 LAB — POC URINALSYSI DIPSTICK (AUTOMATED)
BILIRUBIN UA: NEGATIVE
Glucose, UA: NEGATIVE
KETONES UA: NEGATIVE
Leukocytes, UA: NEGATIVE
Nitrite, UA: NEGATIVE
PH UA: 6 (ref 5.0–8.0)
Protein, UA: NEGATIVE
Spec Grav, UA: 1.02 (ref 1.010–1.025)
Urobilinogen, UA: NEGATIVE E.U./dL — AB

## 2018-09-06 MED ORDER — CYCLOBENZAPRINE HCL 5 MG PO TABS: 5.0000 mg | ORAL_TABLET | Freq: Every day | ORAL | 0 refills | Status: DC

## 2018-09-06 MED ORDER — TRAMADOL HCL 50 MG PO TABS: 50.0000 mg | ORAL_TABLET | Freq: Three times a day (TID) | ORAL | 0 refills | Status: DC | PRN

## 2018-09-06 NOTE — Patient Instructions (Addendum)
For back pain will get lumbar xray. You can use tylenol for mild pain. For moderate to severe pain will rx tramadol.   Rx flexeril low dose to use at night.  You had some trace blood in your urine. If back pain worsens then will get ct renal stone protocol will repeat urine microscopy in 10 days. Make sure you hydrate well. You do report hx of trace blood in urine in past. No urology work up in past.   You have hx of atrial fibrillation and brady-tachy syndrome. Ekg showed pacemaker with run of pvc. Will try to call cardiologist office today to review you ekg and notify him of our pulse ox readings here they likely can download pacemaker readings.  If any cardiac signs and symptoms then recommend ED evaluation.   Follow up 10 days or prn

## 2018-09-06 NOTE — Telephone Encounter (Signed)
Dr. Rayann Heman, II saw mutual patient today for back pain.  He had no cardiac signs or symptoms reported.  No dyspnea either.  While patient was being roomed our pulse ox initially read his pulse at 120 then it jumped up to 240 briefly for a few seconds.  Pulse by o2 sat monitor came down quickly to 70-80 range per medical assistant rooming patient.  I examined patient and he was in atrial fibrillation and by pulse ox his rate was 67-77 the entire interview.  Later EKG showed atrial fibrillation with run of ventricular beats.  Ventricular rate was 61.  EKG is in the computer.  He has a pacemaker and wanted to pass this information along to see if pacemaker reviewed showed abnormal rate of 240 today?  Or maybe our machine gave erroneous reading?.  I am sending this message to Theodoro Doing RN as well. I am not sure if she works with you.  Thanks for your help.  Mackie Pai, PA-C

## 2018-09-06 NOTE — Progress Notes (Signed)
Subjective:    Patient ID: Brian Davidson, male    DOB: [birth year], 83 y.o.   MRN: [id number]  HPI  Pt in for some rt lower back area pain. Pain is constant. But worse when he first stands. Some pain for about 5 weeks. On christmas day pain was moderate to severe. No fall or injury. Pain just came on gradually. At one point radiated toward lumbar spine but not mid spine now. He states some kidney stone in past but this pain feels different. Pt can find place in back that hurts. Pt has tried only tylenol for pain.   Pt has hx of atrial fibrillation. Also on chart review tachy- brady syndrome. He has pacemaker. He is on xarelto. Pt initially had pulse 240 on our 02 sat monitor. Then came down quickly to 120. Now in room I placed on o2 sat monitor. Sat in 98% range. Pulse was in mid 60's to high 70's for about 15-20 minutes. Briefly went to 99 for 2-3 seconds.  Review of Systems  Constitutional: Negative for chills, fatigue and fever.  HENT: Negative for congestion, ear discharge and ear pain.   Respiratory: Negative for cough, chest tightness, shortness of breath and wheezing.   Cardiovascular: Negative for chest pain and palpitations.  Gastrointestinal: Negative for abdominal pain, nausea and rectal pain.  Musculoskeletal: Positive for back pain.  Skin: Negative for rash.  Neurological: Negative for dizziness, syncope and weakness.  Hematological: Negative for adenopathy. Does not bruise/bleed easily.  Psychiatric/Behavioral: Negative for behavioral problems and confusion. The patient is not nervous/anxious.     Past Medical History:  Diagnosis Date  . Arthritis   . Biliary dyskinesia   . Carotid artery disease ([place]) 07/01/2016  . Dyslipidemia 07/13/2017  . GERD (gastroesophageal reflux disease)   . H/O measles   . History of chicken pox   . Hypertension   . Kidney stone 03/2015  . Persistent atrial fibrillation   . Pneumonia 1992  . Tachycardia-bradycardia ([place])    a. s/p STJ  dual chamber pacemaker  . Thiamine deficiency 01/19/2018  . Valvular heart disease 07/01/2016     Social History   Socioeconomic History  . Marital status: Married    Spouse name: [name]  . Number of children: 1  . Years of education: Not on file  . Highest education level: Not on file  Occupational History  . Occupation: Retired  . Occupation: Dance movement psychotherapist for [employer]  . Financial resource strain: Not on file  . Food insecurity:    Worry: Not on file    Inability: Not on file  . Transportation needs:    Medical: Not on file    Non-medical: Not on file  Tobacco Use  . Smoking status: Former Smoker    Years: 10.00    Types: Pipe, Cigars    Last attempt to quit: 11/18/1985    Years since quitting: 32.8  . Smokeless tobacco: Never Used  Substance and Sexual Activity  . Alcohol use: Yes    Alcohol/week: 3.0 standard drinks    Types: 3 Cans of beer per week    Comment: occ  . Drug use: No  . Sexual activity: Never  Lifestyle  . Physical activity:    Days per week: Not on file    Minutes per session: Not on file  . Stress: Not on file  Relationships  . Social connections:    Talks on phone: Not on file    Gets  together: Not on file    Attends religious service: Not on file    Active member of club or organization: Not on file    Attends meetings of clubs or organizations: Not on file    Relationship status: Not on file  . Intimate partner violence:    Fear of current or ex partner: Not on file    Emotionally abused: Not on file    Physically abused: Not on file    Forced sexual activity: Not on file  Other Topics Concern  . Not on file  Social History Narrative   Lives with wife, still works part-time as a Geophysicist/field seismologist. He is active around the house and yard...   Parents are deceased and did not have cardiac issues but he has 2 siblings with atrial fibrillation and a sister-in-law  has a pacemaker.    Past Surgical History:  Procedure Laterality  Date  . CHOLECYSTECTOMY N/A 11/24/2015   Procedure: LAPAROSCOPIC CHOLECYSTECTOMY;  Surgeon: Greer Pickerel, MD;  Location: [facility];  Service: General;  Laterality: N/A;  . CYSTOSCOPY  2016  . FRACTURE SURGERY  2005   left ankle  . HERNIA REPAIR     left groin, no mesh  . PERMANENT PACEMAKER INSERTION N/A 01/31/2012   SJM Accent SR RF implanted by DR Allred    Family History  Problem Relation Age of Onset  . Hypertension Mother   . Arthritis Mother   . Drug abuse Mother   . Stroke Father   . Heart disease Father   . Heart disease Sister 80  . Heart disease Brother   . Hypertension Son   . Heart disease Sister   . Neuropathy Brother   . Hypertension Brother   . Atrial fibrillation Brother   . Atrial fibrillation Sister   . Colon cancer Neg Hx     Allergies  Allergen Reactions  . Cardizem [Diltiazem]     Rash from Yellow dye in generic capsule. Can take different brand. Takes diltiazem - his allergy is to Cardizem  . Diltiazem Hcl     Rash from red dye in generic capsule. Can take different brand. Takes diltiazem - his allergy is to Cardizem Rash from red dye in generic capsule. Can take different brand. Takes diltiazem - his allergy is to Cardizem  . Sulfa Antibiotics     Unknown childhood reaction    Current Outpatient Medications on File Prior to Visit  Medication Sig Dispense Refill  . diltiazem (CARDIZEM CD) 240 MG 24 hr capsule TAKE ONE (1) CAPSULE EACH DAY 90 capsule 3  . gabapentin (NEURONTIN) 300 MG capsule Take 1-2 capsules (300-600 mg total) by mouth at bedtime. 60 capsule 5  . irbesartan-hydrochlorothiazide (AVALIDE) 150-12.5 MG tablet TAKE 1/2 (ONE-HALF) TABLET BY MOUTH ONCE DAILY 45 tablet 1  . loratadine (CLARITIN) 10 MG tablet Take 10 mg by mouth daily as needed. Reported on 12/31/2015    . meclizine (ANTIVERT) 12.5 MG tablet Take 1 tablet (12.5 mg total) by mouth 3 (three) times daily as needed for dizziness. 30 tablet 0  . metoprolol tartrate  (LOPRESSOR) 100 MG tablet TAKE 1 TABLET BY MOUTH TWICE DAILY 180 tablet 3  . Turmeric 500 MG TABS Take by mouth.    Alveda Reasons 20 MG TABS tablet TAKE ONE TABLET BY MOUTH ONCE DAILY 90 tablet 3   No current facility-administered medications on file prior to visit.     BP 140/61   Pulse 65   Temp 98.5 F (36.9 C) (  Oral)   Resp 16   Ht 6' (1.829 m)   Wt 223 lb 9.6 oz (101.4 kg)   SpO2 100%   BMI 30.33 kg/m       Objective:   Physical Exam  General Appearance- Not in acute distress.    Chest and Lung Exam Auscultation: Breath sounds:-Normal. Clear even and unlabored. Adventitious sounds:- No Adventitious sounds.  Cardiovascular Auscultation:Rhythm - Regular, rate and rhythm. Heart Sounds -Normal heart sounds.  Abdomen Inspection:-Inspection Normal.  Palpation/Perucssion: Palpation and Percussion of the abdomen reveal- Non Tender, No Rebound tenderness, No rigidity(Guarding) and No Palpable abdominal masses.  Liver:-Normal.  Spleen:- Normal.   Back No Mid lumbar spine tenderness to palpation. Rt side para lumbar tenderness. Just below cva area. Pain on straight leg lift. Pain on lateral movements and flexion/extension of the spine.  Lower ext neurologic  L5-S1 sensation intact bilaterally. Normal patellar reflexes bilaterally. No foot drop bilaterally.      Assessment & Plan:  For back pain will get lumbar xray. You can use tylenol for mild pain. For moderate to severe pain will rx tramadol.   Rx flexeril low dose to use at night.  You had some trace blood in your urine. If back pain worsens then will get ct renal stone protocol will repeat urine microscopy in 10 days. Make sure you hydrate well. You do report hx of trace blood in urine in past. No urology work up in past.   You have hx of atrial fibrillation and brady-tachy syndrome. Ekg showed pacemaker with run of pvc. Will try to call cardiologist office today to review you ekg and notify him of our pulse ox  readings here they likely can download pacemaker readings.  If any cardiac signs and symptoms then recommend ED evaluation.  Follow up 10 days or prn  40 minutes spent with pt 50% of time spent counseling on plan and treatment of his conditions.  Mackie Pai, PA-C

## 2018-09-07 ENCOUNTER — Telehealth: Payer: Self-pay | Admitting: *Deleted

## 2018-09-07 NOTE — Telephone Encounter (Signed)
Received message from [facility], [place], requesting that we review a transmission from patient's PPM due to elevated HR noted during appointment on 09/06/18.  Spoke with patient's wife, who reports patient is not home. Requested manual transmission from patient's home monitor. Advised it will be reviewed on Monday. Patient's wife verbalizes understanding and states she will tell the patient.

## 2018-09-08 NOTE — Telephone Encounter (Signed)
He has permanent afib.  EKG from your visit reveals afib with controlled ventricular rate and ventricular pacing on demand.  This is normal for him. Last pacemaker remote from December is also normal for him.  In the absence of symptoms, I would not advise any further workup at this time.  We will have him send a manual transmission and review on Monday.  Thompson Grayer MD, [facility] 09/08/2018 6:38 PM

## 2018-09-09 NOTE — Telephone Encounter (Signed)
Thanks for your help.  Mackie Pai, PA-C

## 2018-09-10 NOTE — Telephone Encounter (Signed)
I help the patient to send a manual transmission. Pt states he wants [place] to call him before calling his wife.

## 2018-09-10 NOTE — Telephone Encounter (Signed)
Transmission received. Normal PPM function. No HVRs noted. Vp 12%. Presenting rhythm shows irregular Vs 71-132bpm. Histograms appropriate. Routed to Dr. Rayann Heman for review.

## 2018-09-10 NOTE — Telephone Encounter (Signed)
LMOVM for pt to return call. Pt needs to send a manual transmission.

## 2018-09-17 ENCOUNTER — Ambulatory Visit (INDEPENDENT_AMBULATORY_CARE_PROVIDER_SITE_OTHER): Payer: PPO | Admitting: Medical

## 2018-09-17 ENCOUNTER — Encounter: Payer: Self-pay | Admitting: Medical

## 2018-09-17 VITALS — BP 120/60 | HR 60 | Temp 98.3°F | Resp 16 | Ht 72.0 in | Wt 222.4 lb

## 2018-09-17 DIAGNOSIS — R42 Dizziness and giddiness: Secondary | ICD-10-CM

## 2018-09-17 DIAGNOSIS — R319 Hematuria, unspecified: Secondary | ICD-10-CM

## 2018-09-17 DIAGNOSIS — M5441 Lumbago with sciatica, right side: Secondary | ICD-10-CM | POA: Diagnosis not present

## 2018-09-17 LAB — URINALYSIS, ROUTINE W REFLEX MICROSCOPIC
Bilirubin Urine: NEGATIVE
KETONES UR: NEGATIVE
Leukocytes, UA: NEGATIVE
NITRITE: NEGATIVE
SPECIFIC GRAVITY, URINE: 1.01 (ref 1.000–1.030)
Total Protein, Urine: NEGATIVE
URINE GLUCOSE: NEGATIVE
Urobilinogen, UA: 0.2 (ref 0.0–1.0)
pH: 7 (ref 5.0–8.0)

## 2018-09-17 NOTE — Progress Notes (Signed)
Subjective:    Patient ID: Brian Davidson, male    DOB: [birth year], 83 y.o.   MRN: [id number]  HPI  Pt in for follow up.  He states he feels better 2 days after last appointment. Then yesterday he was bending over and lifting some small branches. Yesterday pain lasted 4-5 hours but now resolved.Odette Horns report showed. Pt took tylenol for pain. He took tramadol and it caused insomnia so he only took it one time.  FINDINGS: Frontal, lateral, and spot lumbosacral lateral images were obtained. There are 5 non-rib-bearing lumbar type vertebral bodies. There is no fracture or spondylolisthesis. There is severe disc space narrowing at L5-S1. There is slight disc space narrowing at L3-4 and L4-5. There are anterior osteophytes at all levels. No erosive changes. There is aortoiliac atherosclerosis.  IMPRESSION: Osteoarthritic change, most severe at L5-S1, similar to recent study. No fracture or spondylolisthesis  Pt had some trace blood in his urine. I was holding off getting CT renal stone protocol since was not convinced his presentation was not kidney stone like. Pt years ago mentioned some blood in urine as well but no follow up.Pt used to smoke cigars more than 30 years ago.  Pt has no cardiac symptoms.  Does not recently when stands up quickly will feel transiently dizziness on standing. No gross motor or sensory function deficits.    Review of Systems  Constitutional: Negative for chills and fatigue.  Eyes: Negative for photophobia, redness and itching.  Respiratory: Negative for cough, chest tightness and wheezing.   Gastrointestinal: Negative for abdominal pain, anal bleeding, blood in stool, diarrhea and vomiting.  Genitourinary: Negative for dysuria and flank pain.  Musculoskeletal: Negative for back pain and myalgias.       Back pain resolved presently.  Skin: Negative for rash.  Neurological: Negative for dizziness, seizures, facial asymmetry and weakness.       No  dizziness. See hpi.  Hematological: Negative for adenopathy. Does not bruise/bleed easily.  Psychiatric/Behavioral: Negative for behavioral problems, confusion and suicidal ideas. The patient is not nervous/anxious.     Past Medical History:  Diagnosis Date  . Arthritis   . Biliary dyskinesia   . Carotid artery disease ([place]) 07/01/2016  . Dyslipidemia 07/13/2017  . GERD (gastroesophageal reflux disease)   . H/O measles   . History of chicken pox   . Hypertension   . Kidney stone 03/2015  . Persistent atrial fibrillation   . Pneumonia 1992  . Tachycardia-bradycardia ([place])    a. s/p STJ dual chamber pacemaker  . Thiamine deficiency 01/19/2018  . Valvular heart disease 07/01/2016     Social History   Socioeconomic History  . Marital status: Married    Spouse name: [name]  . Number of children: 1  . Years of education: Not on file  . Highest education level: Not on file  Occupational History  . Occupation: Retired  . Occupation: Dance movement psychotherapist for [employer]  . Financial resource strain: Not on file  . Food insecurity:    Worry: Not on file    Inability: Not on file  . Transportation needs:    Medical: Not on file    Non-medical: Not on file  Tobacco Use  . Smoking status: Former Smoker    Years: 10.00    Types: Pipe, Cigars    Last attempt to quit: 11/18/1985    Years since quitting: 32.8  . Smokeless tobacco: Never Used  Substance and Sexual Activity  .  Alcohol use: Yes    Alcohol/week: 3.0 standard drinks    Types: 3 Cans of beer per week    Comment: occ  . Drug use: No  . Sexual activity: Never  Lifestyle  . Physical activity:    Days per week: Not on file    Minutes per session: Not on file  . Stress: Not on file  Relationships  . Social connections:    Talks on phone: Not on file    Gets together: Not on file    Attends religious service: Not on file    Active member of club or organization: Not on file    Attends meetings of clubs or  organizations: Not on file    Relationship status: Not on file  . Intimate partner violence:    Fear of current or ex partner: Not on file    Emotionally abused: Not on file    Physically abused: Not on file    Forced sexual activity: Not on file  Other Topics Concern  . Not on file  Social History Narrative   Lives with wife, still works part-time as a Geophysicist/field seismologist. He is active around the house and yard...   Parents are deceased and did not have cardiac issues but he has 2 siblings with atrial fibrillation and a sister-in-law  has a pacemaker.    Past Surgical History:  Procedure Laterality Date  . CHOLECYSTECTOMY N/A 11/24/2015   Procedure: LAPAROSCOPIC CHOLECYSTECTOMY;  Surgeon: Greer Pickerel, MD;  Location: [facility];  Service: General;  Laterality: N/A;  . CYSTOSCOPY  2016  . FRACTURE SURGERY  2005   left ankle  . HERNIA REPAIR     left groin, no mesh  . PERMANENT PACEMAKER INSERTION N/A 01/31/2012   SJM Accent SR RF implanted by DR Allred    Family History  Problem Relation Age of Onset  . Hypertension Mother   . Arthritis Mother   . Drug abuse Mother   . Stroke Father   . Heart disease Father   . Heart disease Sister 10  . Heart disease Brother   . Hypertension Son   . Heart disease Sister   . Neuropathy Brother   . Hypertension Brother   . Atrial fibrillation Brother   . Atrial fibrillation Sister   . Colon cancer Neg Hx     Allergies  Allergen Reactions  . Cardizem [Diltiazem]     Rash from Yellow dye in generic capsule. Can take different brand. Takes diltiazem - his allergy is to Cardizem  . Diltiazem Hcl     Rash from red dye in generic capsule. Can take different brand. Takes diltiazem - his allergy is to Cardizem Rash from red dye in generic capsule. Can take different brand. Takes diltiazem - his allergy is to Cardizem  . Sulfa Antibiotics     Unknown childhood reaction    Current Outpatient Medications on File Prior to Visit  Medication Sig  Dispense Refill  . diltiazem (CARDIZEM CD) 240 MG 24 hr capsule TAKE ONE (1) CAPSULE EACH DAY 90 capsule 3  . gabapentin (NEURONTIN) 300 MG capsule Take 1-2 capsules (300-600 mg total) by mouth at bedtime. 60 capsule 5  . irbesartan-hydrochlorothiazide (AVALIDE) 150-12.5 MG tablet TAKE 1/2 (ONE-HALF) TABLET BY MOUTH ONCE DAILY 45 tablet 1  . loratadine (CLARITIN) 10 MG tablet Take 10 mg by mouth daily as needed. Reported on 12/31/2015    . metoprolol tartrate (LOPRESSOR) 100 MG tablet TAKE 1 TABLET BY MOUTH TWICE DAILY 180  tablet 3  . Turmeric 500 MG TABS Take by mouth.    Alveda Reasons 20 MG TABS tablet TAKE ONE TABLET BY MOUTH ONCE DAILY 90 tablet 3   No current facility-administered medications on file prior to visit.     BP (!) 115/45   Pulse 60   Temp 98.3 F (36.8 C) (Oral)   Resp 16   Ht 6' (1.829 m)   Wt 222 lb 6.4 oz (100.9 kg)   SpO2 98%   BMI 30.16 kg/m       Objective:   Physical Exam  General Mental Status- Alert. General Appearance- Not in acute distress.   Skin General: Color- Normal Color. Moisture- Normal Moisture.  Neck Carotid Arteries- Normal color. Moisture- Normal Moisture. No carotid bruits. No JVD.  Chest and Lung Exam Auscultation: Breath Sounds:-Normal.  Cardiovascular Auscultation:Rythm- Regular. Murmurs & Other Heart Sounds:Auscultation of the heart reveals- No Murmurs.  Abdomen Inspection:-Inspeection Normal. Palpation/Percussion:Note:No mass. Palpation and Percussion of the abdomen reveal- Non Tender, Non Distended + BS, no rebound or guarding.    Neurologic Cranial Nerve exam:- CN III-XII intact(No nystagmus), symmetric smile. Drift Test:- No drift. Romberg Exam:- Negative.  Heal to Toe Gait exam:-Normal. Finger to Nose:- Normal/Intact Strength:- 5/5 equal and symmetric strength both upper and lower extremities. On sitting to standing no dizziness. On lying down to sitting. One second transient light headed. No vertigo on turning  head.      Assessment & Plan:  Your back pain resolved a couple of days after I last saw you.  X-ray of your back did show osteoarthritic changes.  Your pain you described in resolution of pain points more towards likely muscle pain.  Some component of pain may have been from the osteoarthritis.  Pain also appears to be associated more with bending over and work around the house.  So would limit excess work type activities.  If you can get hold of back brace it may be helpful to use when doing any outdoor type of work.  Your dizziness is very mild and transient lasting for only seconds.  Presently intermittent only.  He had good neurologic exam.  Offered CBC and metabolic panel today.  You declined.  Make sure that you stay well hydrated.  If dizziness comes back and will constant then recommend getting the labs.  If dizziness returns and you have other neurologic type signs or symptoms of discussed then recommend ED evaluation.  You had some trace blood in your urine on last visit.  Presentation did not appear to be kidney stone like.  Pain did resolve.  But I do want to repeat urine in the lab to see if blood persist.  If blood does persist consider referral to urologist.  Follow-up date to be determined after review of urine.  If urine is clear and no worsening signs and symptoms then keep follow-up with PCP in May.  Mackie Pai, PA-C

## 2018-09-17 NOTE — Patient Instructions (Addendum)
Your back pain resolved a couple of days after I last saw you.  X-ray of your back did show osteoarthritic changes.  Your pain you described in resolution of pain points more towards likely muscle pain.  Some component of pain may have been from the osteoarthritis.  Pain also appears to be associated more with bending over and work around the house.  So would limit excess work type activities.  If you can get hold of back brace it may be helpful to use when doing any outdoor type of work.  Your dizziness is very mild and transient lasting for only seconds.  Presently intermittent only.  He had good neurologic exam.  Offered CBC and metabolic panel today.  You declined.  Make sure that you stay well hydrated.  If dizziness comes back and will constant then recommend getting the labs.  If dizziness returns and you have other neurologic type signs or symptoms of discussed then recommend ED evaluation.  You had some trace blood in your urine on last visit.  Presentation did not appear to be kidney stone like.  Pain did resolve.  But I do want to repeat urine in the lab to see if blood persist.  If blood does persist consider referral to urologist.  Follow-up date to be determined after review of urine.  If urine is clear and no worsening signs and symptoms then keep follow-up with PCP in May.

## 2018-09-19 NOTE — Telephone Encounter (Signed)
Dr. Rayann Heman reviewed data--no high ventricular rate episodes noted. No recommended changes at this time.

## 2018-09-27 DIAGNOSIS — M25561 Pain in right knee: Secondary | ICD-10-CM | POA: Diagnosis not present

## 2018-09-27 DIAGNOSIS — M1711 Unilateral primary osteoarthritis, right knee: Secondary | ICD-10-CM | POA: Diagnosis not present

## 2018-10-08 ENCOUNTER — Other Ambulatory Visit: Payer: Self-pay | Admitting: Family Medicine

## 2018-10-18 ENCOUNTER — Telehealth: Payer: Self-pay | Admitting: Medical

## 2018-10-18 DIAGNOSIS — R319 Hematuria, unspecified: Secondary | ICD-10-CM

## 2018-10-18 NOTE — Telephone Encounter (Signed)
Referral to urologist placed. 

## 2018-10-30 DIAGNOSIS — N402 Nodular prostate without lower urinary tract symptoms: Secondary | ICD-10-CM | POA: Diagnosis not present

## 2018-10-30 DIAGNOSIS — R3121 Asymptomatic microscopic hematuria: Secondary | ICD-10-CM | POA: Diagnosis not present

## 2018-11-05 ENCOUNTER — Encounter: Payer: Self-pay | Admitting: Medical

## 2018-11-05 ENCOUNTER — Ambulatory Visit (INDEPENDENT_AMBULATORY_CARE_PROVIDER_SITE_OTHER): Payer: PPO | Admitting: Medical

## 2018-11-05 VITALS — BP 129/59 | HR 71 | Temp 98.1°F | Resp 16 | Ht 72.0 in | Wt 223.0 lb

## 2018-11-05 DIAGNOSIS — R1011 Right upper quadrant pain: Secondary | ICD-10-CM | POA: Diagnosis not present

## 2018-11-05 MED ORDER — FAMCICLOVIR 500 MG PO TABS: 500.0000 mg | ORAL_TABLET | Freq: Three times a day (TID) | ORAL | 0 refills | Status: DC

## 2018-11-05 NOTE — Progress Notes (Signed)
Subjective:    Patient ID: Brian Davidson, male    DOB: 11/26/1935, 83 y.o.   MRN: [id number]  HPI  Pt in for evaluation for rt upper quadrant pain.  He is pointing to rt upper quadrant area. Pain states moderate level pain. He at times can feel pain after he eats. States burning pain level 5/10. Started out sharp prickly pain.  Pt states pain for about 9 days. Today pain is a lot  less.  No rash on skin. No nausea and no vomiting. Last night felt transient chill.  Pt had his gallbladder reomved. 3 years ago approximate.   Review of Systems  Constitutional: Positive for chills. Negative for fatigue and fever.       Just last night.  HENT: Negative for congestion.   Respiratory: Negative for cough, chest tightness, shortness of breath and wheezing.   Cardiovascular: Negative for chest pain and palpitations.  Gastrointestinal: Positive for abdominal pain. Negative for abdominal distention, constipation, diarrhea and nausea.       Some after eating.   Musculoskeletal: Negative for back pain.  Skin: Negative for rash.       No rash.   Neurological: Negative for dizziness, syncope, weakness, light-headedness and numbness.  Hematological: Negative for adenopathy. Does not bruise/bleed easily.  Psychiatric/Behavioral: Negative for behavioral problems and confusion.   Past Medical History:  Diagnosis Date  . Arthritis   . Biliary dyskinesia   . Carotid artery disease ([place]) 07/01/2016  . Dyslipidemia 07/13/2017  . GERD (gastroesophageal reflux disease)   . H/O measles   . History of chicken pox   . Hypertension   . Kidney stone 03/2015  . Persistent atrial fibrillation   . Pneumonia 1992  . Tachycardia-bradycardia ([place])    a. s/p STJ dual chamber pacemaker  . Thiamine deficiency 01/19/2018  . Valvular heart disease 07/01/2016     Social History   Socioeconomic History  . Marital status: Married    Spouse name: [name]  . Number of children: 1  . Years of education: Not  on file  . Highest education level: Not on file  Occupational History  . Occupation: Retired  . Occupation: Dance movement psychotherapist for [employer]  . Financial resource strain: Not on file  . Food insecurity:    Worry: Not on file    Inability: Not on file  . Transportation needs:    Medical: Not on file    Non-medical: Not on file  Tobacco Use  . Smoking status: Former Smoker    Years: 10.00    Types: Pipe, Cigars    Last attempt to quit: 11/18/1985    Years since quitting: 32.9  . Smokeless tobacco: Never Used  Substance and Sexual Activity  . Alcohol use: Yes    Alcohol/week: 3.0 standard drinks    Types: 3 Cans of beer per week    Comment: occ  . Drug use: No  . Sexual activity: Never  Lifestyle  . Physical activity:    Days per week: Not on file    Minutes per session: Not on file  . Stress: Not on file  Relationships  . Social connections:    Talks on phone: Not on file    Gets together: Not on file    Attends religious service: Not on file    Active member of club or organization: Not on file    Attends meetings of clubs or organizations: Not on file    Relationship status: Not  on file  . Intimate partner violence:    Fear of current or ex partner: Not on file    Emotionally abused: Not on file    Physically abused: Not on file    Forced sexual activity: Not on file  Other Topics Concern  . Not on file  Social History Narrative   Lives with wife, still works part-time as a Geophysicist/field seismologist. He is active around the house and yard...   Parents are deceased and did not have cardiac issues but he has 2 siblings with atrial fibrillation and a sister-in-law  has a pacemaker.    Past Surgical History:  Procedure Laterality Date  . CHOLECYSTECTOMY N/A 11/24/2015   Procedure: LAPAROSCOPIC CHOLECYSTECTOMY;  Surgeon: Greer Pickerel, MD;  Location: [facility];  Service: General;  Laterality: N/A;  . CYSTOSCOPY  2016  . FRACTURE SURGERY  2005   left ankle  . HERNIA REPAIR       left groin, no mesh  . PERMANENT PACEMAKER INSERTION N/A 01/31/2012   SJM Accent SR RF implanted by DR Allred    Family History  Problem Relation Age of Onset  . Hypertension Mother   . Arthritis Mother   . Drug abuse Mother   . Stroke Father   . Heart disease Father   . Heart disease Sister 19  . Heart disease Brother   . Hypertension Son   . Heart disease Sister   . Neuropathy Brother   . Hypertension Brother   . Atrial fibrillation Brother   . Atrial fibrillation Sister   . Colon cancer Neg Hx     Allergies  Allergen Reactions  . Cardizem [Diltiazem]     Rash from Yellow dye in generic capsule. Can take different brand. Takes diltiazem - his allergy is to Cardizem  . Diltiazem Hcl     Rash from red dye in generic capsule. Can take different brand. Takes diltiazem - his allergy is to Cardizem Rash from red dye in generic capsule. Can take different brand. Takes diltiazem - his allergy is to Cardizem  . Sulfa Antibiotics     Unknown childhood reaction    Current Outpatient Medications on File Prior to Visit  Medication Sig Dispense Refill  . diltiazem (CARDIZEM CD) 240 MG 24 hr capsule TAKE ONE (1) CAPSULE EACH DAY 90 capsule 3  . gabapentin (NEURONTIN) 300 MG capsule Take 1-2 capsules (300-600 mg total) by mouth at bedtime. 60 capsule 5  . irbesartan-hydrochlorothiazide (AVALIDE) 150-12.5 MG tablet TAKE 1/2 (ONE-HALF) TABLET BY MOUTH ONCE DAILY 45 tablet 0  . loratadine (CLARITIN) 10 MG tablet Take 10 mg by mouth daily as needed. Reported on 12/31/2015    . metoprolol tartrate (LOPRESSOR) 100 MG tablet TAKE 1 TABLET BY MOUTH TWICE DAILY 180 tablet 3  . Turmeric 500 MG TABS Take by mouth.    Alveda Reasons 20 MG TABS tablet TAKE ONE TABLET BY MOUTH ONCE DAILY 90 tablet 3   No current facility-administered medications on file prior to visit.     BP (!) 129/59   Pulse 71   Temp 98.1 F (36.7 C) (Oral)   Resp 16   Ht 6' (1.829 m)   Wt 223 lb (101.2 kg)   SpO2  99%   BMI 30.24 kg/m       Objective:   Physical Exam  General Appearance- Not in acute distress.  HEENT Eyes- Scleraeral/Conjuntiva-bilat- Not Yellow. Mouth & Throat- Normal.  Chest and Lung Exam Auscultation: Breath sounds:-Normal. Adventitious sounds:- No Adventitious  sounds.  Cardiovascular Auscultation:Rythm - Regular. Heart Sounds -Normal heart sounds.  Abdomen Inspection:-Inspection Normal.  Palpation/Perucssion: Palpation and Percussion of the abdomen reveal- Non Tender, No Rebound tenderness, No rigidity(Guarding) and No Palpable abdominal masses.  Liver:-Normal.  Spleen:- Normal.   Back- no cva tenderness  .     Assessment & Plan:  You do have some right upper quadrant/abdominal region pain.  History of cholecystectomy in the past.  Some pain in that area radiating to the back.  Differential diagnosis could include fatty liver, retained common bile duct stone, muscle pain, reflux and a possible early atypical shingles eruption.  Presently wanted to get a CBC, CMP and H. pylori antibody study.(No history of H. pylori in the past so IgG antibiotic could suffice.)  I want you to eat bland foods and try Pepcid/famotidine over-the-counter.  Discussed differential diagnosis today but if you do see any vesicular/small blister eruption or rash then go ahead and start Famvir.  Print prescription given today.  If you develop any cough then would consider getting chest x-ray to look at in the right lower lobe pneumonia.  Follow-up in 7 to 10 days or as needed.  Mackie Pai, PA-C

## 2018-11-05 NOTE — Patient Instructions (Signed)
You do have some right upper quadrant/abdominal region pain.  History of cholecystectomy in the past.  Some pain in that area radiating to the back.  Differential diagnosis could include fatty liver, retained common bile duct stone, muscle pain, reflux and a possible early atypical shingles eruption.  Presently wanted to get a CBC, CMP and H. pylori antibody study.(No history of H. pylori in the past so IgG antibiotic could suffice.)  I want you to eat bland foods and try Pepcid/famotidine over-the-counter.  Discussed differential diagnosis today but if you do see any vesicular/small blister eruption or rash then go ahead and start Famvir.  Print prescription given today.  If you develop any cough then would consider getting chest x-ray to look at in the right lower lobe pneumonia.  Follow-up in 7 to 10 days or as needed.

## 2018-11-06 ENCOUNTER — Encounter: Payer: Self-pay | Admitting: Medical

## 2018-11-06 ENCOUNTER — Ambulatory Visit (HOSPITAL_BASED_OUTPATIENT_CLINIC_OR_DEPARTMENT_OTHER)
Admission: RE | Admit: 2018-11-06 | Discharge: 2018-11-06 | Disposition: A | Discharge status: Home / Self Care | Payer: PPO | Source: Ambulatory Visit | Attending: Medical | Admitting: Medical

## 2018-11-06 ENCOUNTER — Other Ambulatory Visit (INDEPENDENT_AMBULATORY_CARE_PROVIDER_SITE_OTHER): Payer: PPO

## 2018-11-06 DIAGNOSIS — R1011 Right upper quadrant pain: Secondary | ICD-10-CM | POA: Insufficient documentation

## 2018-11-06 LAB — CBC WITH DIFFERENTIAL/PLATELET
Basophils Absolute: 0 10*3/uL (ref 0.0–0.1)
Basophils Relative: 0.5 % (ref 0.0–3.0)
EOS PCT: 1.6 % (ref 0.0–5.0)
Eosinophils Absolute: 0.1 10*3/uL (ref 0.0–0.7)
HCT: 43 % (ref 39.0–52.0)
Hemoglobin: 14.5 g/dL (ref 13.0–17.0)
Lymphocytes Relative: 10.3 % — ABNORMAL LOW (ref 12.0–46.0)
Lymphs Abs: 1 10*3/uL (ref 0.7–4.0)
MCHC: 33.7 g/dL (ref 30.0–36.0)
MCV: 90.5 fl (ref 78.0–100.0)
MONOS PCT: 12.6 % — AB (ref 3.0–12.0)
Monocytes Absolute: 1.2 10*3/uL — ABNORMAL HIGH (ref 0.1–1.0)
Neutro Abs: 7 10*3/uL (ref 1.4–7.7)
Neutrophils Relative %: 75 % (ref 43.0–77.0)
Platelets: 158 10*3/uL (ref 150.0–400.0)
RBC: 4.75 Mil/uL (ref 4.22–5.81)
RDW: 15 % (ref 11.5–15.5)
WBC: 9.4 10*3/uL (ref 4.0–10.5)

## 2018-11-06 LAB — COMPREHENSIVE METABOLIC PANEL
ALBUMIN: 3.7 g/dL (ref 3.5–5.2)
ALK PHOS: 76 U/L (ref 39–117)
ALT: 10 U/L (ref 0–53)
AST: 12 U/L (ref 0–37)
BUN: 23 mg/dL (ref 6–23)
CO2: 26 mEq/L (ref 19–32)
Calcium: 8.5 mg/dL (ref 8.4–10.5)
Chloride: 103 mEq/L (ref 96–112)
Creatinine, Ser: 1.23 mg/dL (ref 0.40–1.50)
GFR: 56.46 mL/min — AB (ref >60.00)
Glucose, Bld: 89 mg/dL (ref 70–99)
POTASSIUM: 4 meq/L (ref 3.5–5.1)
Sodium: 139 mEq/L (ref 135–145)
TOTAL PROTEIN: 6.2 g/dL (ref 6.0–8.3)
Total Bilirubin: 0.9 mg/dL (ref 0.2–1.2)

## 2018-11-06 LAB — H. PYLORI ANTIBODY, IGG: H Pylori IgG: NEGATIVE

## 2018-11-06 NOTE — Addendum Note (Signed)
Addended by: Kelle Darting A on: 11/06/2018 08:51 AM   Modules accepted: Orders

## 2018-11-07 ENCOUNTER — Telehealth: Payer: Self-pay | Admitting: Medical

## 2018-11-07 MED ORDER — FAMOTIDINE 20 MG PO TABS: ORAL_TABLET | ORAL | 0 refills | Status: DC

## 2018-11-07 NOTE — Telephone Encounter (Signed)
Sent in famotadine to patient pharmacy.

## 2018-11-08 DIAGNOSIS — M7651 Patellar tendinitis, right knee: Secondary | ICD-10-CM | POA: Diagnosis not present

## 2018-11-08 DIAGNOSIS — M25561 Pain in right knee: Secondary | ICD-10-CM | POA: Diagnosis not present

## 2018-11-12 ENCOUNTER — Ambulatory Visit: Payer: PPO | Admitting: Medical

## 2018-12-03 ENCOUNTER — Ambulatory Visit (INDEPENDENT_AMBULATORY_CARE_PROVIDER_SITE_OTHER): Payer: PPO | Admitting: *Deleted

## 2018-12-03 ENCOUNTER — Other Ambulatory Visit: Payer: Self-pay

## 2018-12-03 DIAGNOSIS — I495 Sick sinus syndrome: Secondary | ICD-10-CM

## 2018-12-03 DIAGNOSIS — I4821 Permanent atrial fibrillation: Secondary | ICD-10-CM

## 2018-12-04 LAB — CUP PACEART REMOTE DEVICE CHECK
Battery Remaining Percentage: 95.5 %
Battery Voltage: 2.96 V
Implantable Lead Implant Date: 20130528
Implantable Lead Location: 753860
Implantable Lead Model: 1948
Lead Channel Impedance Value: 650 Ohm
Lead Channel Pacing Threshold Pulse Width: 0.4 ms
Lead Channel Setting Pacing Pulse Width: 0.4 ms
MDC IDC MSMT BATTERY REMAINING LONGEVITY: 136 mo
MDC IDC MSMT LEADCHNL RV PACING THRESHOLD AMPLITUDE: 0.75 V
MDC IDC MSMT LEADCHNL RV SENSING INTR AMPL: 12 mV
MDC IDC PG IMPLANT DT: 20130528
MDC IDC SESS DTM: 20200330064957
MDC IDC SET LEADCHNL RV PACING AMPLITUDE: 2.5 V
MDC IDC SET LEADCHNL RV SENSING SENSITIVITY: 2 mV
MDC IDC STAT BRADY RV PERCENT PACED: 11 %
Pulse Gen Serial Number: 7328888

## 2018-12-10 DIAGNOSIS — N402 Nodular prostate without lower urinary tract symptoms: Secondary | ICD-10-CM | POA: Diagnosis not present

## 2018-12-12 ENCOUNTER — Telehealth: Payer: Self-pay

## 2018-12-12 NOTE — Telephone Encounter (Signed)
Prepared #21 Xarelto 20mg (verified) samples for patient. Took to front for pick up.

## 2018-12-12 NOTE — Telephone Encounter (Signed)
Copied from [place] [id number]. Topic: General - Inquiry >> Dec 12, 2018 11:38 AM Alanda Slim E wrote: Reason for CRM:  Pt wants to know if there any xarelto samples in the office that he can pick up/ please advise

## 2018-12-12 NOTE — Progress Notes (Signed)
Remote pacemaker transmission.   

## 2018-12-25 DIAGNOSIS — R972 Elevated prostate specific antigen [PSA]: Secondary | ICD-10-CM | POA: Diagnosis not present

## 2018-12-25 DIAGNOSIS — N402 Nodular prostate without lower urinary tract symptoms: Secondary | ICD-10-CM | POA: Diagnosis not present

## 2019-01-10 ENCOUNTER — Other Ambulatory Visit: Payer: Self-pay | Admitting: Family Medicine

## 2019-01-21 ENCOUNTER — Other Ambulatory Visit: Payer: Self-pay | Admitting: Family Medicine

## 2019-01-25 ENCOUNTER — Other Ambulatory Visit: Payer: PPO

## 2019-01-25 ENCOUNTER — Telehealth: Payer: Self-pay | Admitting: Family Medicine

## 2019-01-25 NOTE — Telephone Encounter (Signed)
I explained to patient when I called to set up virtual visit. He was very rude when we spoke. I let him know that he needed to have a visit because his PCP was scheduled far out due to the Virus he was very upset and said he just doesn't understand and he wanted to come in to see her when ever we allow patients back in the office. He refused a virtual visit

## 2019-01-28 NOTE — Telephone Encounter (Signed)
As long as he understands not bringing him in is to protect him due to his advanced age when the office opens up more he can come in but right now we are not bringing in anyone over 51 due to their risk and the disease is resurgning again.

## 2019-01-30 NOTE — Telephone Encounter (Signed)
Called left voicemail for patient to call the office back

## 2019-01-31 ENCOUNTER — Encounter: Payer: PPO | Admitting: Family Medicine

## 2019-02-06 ENCOUNTER — Telehealth: Payer: Self-pay | Admitting: *Deleted

## 2019-02-06 NOTE — Telephone Encounter (Signed)
Copied from [place] [id number]. Topic: General - Inquiry >> Feb 05, 2019  2:40 PM Scherrie Gerlach wrote: Reason for CRM:  pt wants to know if you have any xarelto samples? Says Dr Charlett Blake helps him out

## 2019-02-06 NOTE — Telephone Encounter (Signed)
Called patient and let him know we have samples waiting for him in the front office

## 2019-02-24 ENCOUNTER — Other Ambulatory Visit: Payer: Self-pay | Admitting: Family Medicine

## 2019-03-04 ENCOUNTER — Ambulatory Visit (INDEPENDENT_AMBULATORY_CARE_PROVIDER_SITE_OTHER): Payer: PPO | Admitting: *Deleted

## 2019-03-04 DIAGNOSIS — I495 Sick sinus syndrome: Secondary | ICD-10-CM

## 2019-03-04 DIAGNOSIS — I4821 Permanent atrial fibrillation: Secondary | ICD-10-CM | POA: Diagnosis not present

## 2019-03-04 LAB — CUP PACEART REMOTE DEVICE CHECK
Battery Remaining Longevity: 125 mo
Battery Remaining Percentage: 91 %
Battery Voltage: 2.95 V
Brady Statistic RV Percent Paced: 11 %
Date Time Interrogation Session: 20200629071853
Implantable Lead Implant Date: 20130528
Implantable Lead Location: 753860
Implantable Lead Model: 1948
Implantable Pulse Generator Implant Date: 20130528
Lead Channel Impedance Value: 660 Ohm
Lead Channel Pacing Threshold Amplitude: 0.75 V
Lead Channel Pacing Threshold Pulse Width: 0.4 ms
Lead Channel Sensing Intrinsic Amplitude: 11.1 mV
Lead Channel Setting Pacing Amplitude: 2.5 V
Lead Channel Setting Pacing Pulse Width: 0.4 ms
Lead Channel Setting Sensing Sensitivity: 2 mV
Pulse Gen Model: 1210
Pulse Gen Serial Number: 7328888

## 2019-03-07 ENCOUNTER — Ambulatory Visit (INDEPENDENT_AMBULATORY_CARE_PROVIDER_SITE_OTHER): Payer: PPO | Admitting: Medical

## 2019-03-07 ENCOUNTER — Other Ambulatory Visit: Payer: Self-pay

## 2019-03-07 ENCOUNTER — Encounter: Payer: Self-pay | Admitting: Medical

## 2019-03-07 VITALS — BP 138/67 | HR 73 | Temp 97.6°F | Resp 16 | Ht 72.0 in | Wt 216.0 lb

## 2019-03-07 DIAGNOSIS — R42 Dizziness and giddiness: Secondary | ICD-10-CM

## 2019-03-07 DIAGNOSIS — I951 Orthostatic hypotension: Secondary | ICD-10-CM

## 2019-03-07 LAB — CBC WITH DIFFERENTIAL/PLATELET
Basophils Absolute: 0.1 10*3/uL (ref 0.0–0.1)
Basophils Relative: 1.2 % (ref 0.0–3.0)
Eosinophils Absolute: 0.3 10*3/uL (ref 0.0–0.7)
Eosinophils Relative: 4.6 % (ref 0.0–5.0)
HCT: 42.5 % (ref 39.0–52.0)
Hemoglobin: 13.9 g/dL (ref 13.0–17.0)
Lymphocytes Relative: 14.8 % (ref 12.0–46.0)
Lymphs Abs: 0.9 10*3/uL (ref 0.7–4.0)
MCHC: 32.7 g/dL (ref 30.0–36.0)
MCV: 89.2 fl (ref 78.0–100.0)
Monocytes Absolute: 0.8 10*3/uL (ref 0.1–1.0)
Monocytes Relative: 12.6 % — ABNORMAL HIGH (ref 3.0–12.0)
Neutro Abs: 4.2 10*3/uL (ref 1.4–7.7)
Neutrophils Relative %: 66.8 % (ref 43.0–77.0)
Platelets: 161 10*3/uL (ref 150.0–400.0)
RBC: 4.76 Mil/uL (ref 4.22–5.81)
RDW: 14.8 % (ref 11.5–15.5)
WBC: 6.3 10*3/uL (ref 4.0–10.5)

## 2019-03-07 LAB — COMPREHENSIVE METABOLIC PANEL
ALT: 13 U/L (ref 0–53)
AST: 18 U/L (ref 0–37)
Albumin: 3.9 g/dL (ref 3.5–5.2)
Alkaline Phosphatase: 78 U/L (ref 39–117)
BUN: 22 mg/dL (ref 6–23)
CO2: 27 mEq/L (ref 19–32)
Calcium: 8.8 mg/dL (ref 8.4–10.5)
Chloride: 104 mEq/L (ref 96–112)
Creatinine, Ser: 1.29 mg/dL (ref 0.40–1.50)
GFR: 53.39 mL/min — ABNORMAL LOW (ref >60.00)
Glucose, Bld: 92 mg/dL (ref 70–99)
Potassium: 3.9 mEq/L (ref 3.5–5.1)
Sodium: 138 mEq/L (ref 135–145)
Total Bilirubin: 1 mg/dL (ref 0.2–1.2)
Total Protein: 6.6 g/dL (ref 6.0–8.3)

## 2019-03-07 MED ORDER — RIVAROXABAN 20 MG PO TABS: 20.0000 mg | ORAL_TABLET | Freq: Every day | ORAL | 3 refills | Status: DC

## 2019-03-07 NOTE — Progress Notes (Signed)
Subjective:    Patient ID: Brian Davidson, male    DOB: 10/07/1935, 83 y.o.   MRN: [id number]  HPI  Pt states he has been having episodes of transient light headed/dizziness. Pt states most of time this occurs after he gets up from seated position. Pt checked bp yesterday. His bp yesterday was 142/84, 106/57, 151/82 and 118/62 seated. Lying down 105/55. One standing 116/54.  Today 138/67.  Pt feels like he is adequatley hydrated. He is not working outside regularly. Not sweating excessively.  Pt has hx of htn. He is on avalide, lopressor and cardizem cd.   Pt has pacemaker. Recent device check 03/04/2019 Echo February 13, 2018.  HIs light headed will be worse when he lies flat on his back and then get up after nap.  Pt pulse when hr checked 83, 70, 72, 67, 72 and 69.  Light headed as described above transeint and occurring daiy basis.    Review of Systems  Constitutional: Negative for chills, fatigue and fever.  HENT: Negative for dental problem.   Respiratory: Negative for chest tightness, shortness of breath and wheezing.   Cardiovascular: Negative for chest pain and palpitations.  Genitourinary: Negative for dysuria and flank pain.  Musculoskeletal: Negative for back pain.  Neurological: Positive for light-headedness. Negative for dizziness, speech difficulty and weakness.       None presently but some daily.  Hematological: Negative for adenopathy. Does not bruise/bleed easily.  Psychiatric/Behavioral: Negative for behavioral problems and confusion.    Past Medical History:  Diagnosis Date  . Arthritis   . Biliary dyskinesia   . Carotid artery disease ([place]) 07/01/2016  . Dyslipidemia 07/13/2017  . GERD (gastroesophageal reflux disease)   . H/O measles   . History of chicken pox   . Hypertension   . Kidney stone 03/2015  . Persistent atrial fibrillation   . Pneumonia 1992  . Tachycardia-bradycardia ([place])    a. s/p STJ dual chamber pacemaker  . Thiamine deficiency  01/19/2018  . Valvular heart disease 07/01/2016     Social History   Socioeconomic History  . Marital status: Married    Spouse name: [name]  . Number of children: 1  . Years of education: Not on file  . Highest education level: Not on file  Occupational History  . Occupation: Retired  . Occupation: Dance movement psychotherapist for [employer]  . Financial resource strain: Not on file  . Food insecurity    Worry: Not on file    Inability: Not on file  . Transportation needs    Medical: Not on file    Non-medical: Not on file  Tobacco Use  . Smoking status: Former Smoker    Years: 10.00    Types: Pipe, Cigars    Quit date: 11/18/1985    Years since quitting: 33.3  . Smokeless tobacco: Never Used  Substance and Sexual Activity  . Alcohol use: Yes    Alcohol/week: 3.0 standard drinks    Types: 3 Cans of beer per week    Comment: occ  . Drug use: No  . Sexual activity: Never  Lifestyle  . Physical activity    Days per week: Not on file    Minutes per session: Not on file  . Stress: Not on file  Relationships  . Social Herbalist on phone: Not on file    Gets together: Not on file    Attends religious service: Not on file    Active  member of club or organization: Not on file    Attends meetings of clubs or organizations: Not on file    Relationship status: Not on file  . Intimate partner violence    Fear of current or ex partner: Not on file    Emotionally abused: Not on file    Physically abused: Not on file    Forced sexual activity: Not on file  Other Topics Concern  . Not on file  Social History Narrative   Lives with wife, still works part-time as a Geophysicist/field seismologist. He is active around the house and yard...   Parents are deceased and did not have cardiac issues but he has 2 siblings with atrial fibrillation and a sister-in-law  has a pacemaker.    Past Surgical History:  Procedure Laterality Date  . CHOLECYSTECTOMY N/A 11/24/2015   Procedure:  LAPAROSCOPIC CHOLECYSTECTOMY;  Surgeon: Greer Pickerel, MD;  Location: [facility];  Service: General;  Laterality: N/A;  . CYSTOSCOPY  2016  . FRACTURE SURGERY  2005   left ankle  . HERNIA REPAIR     left groin, no mesh  . PERMANENT PACEMAKER INSERTION N/A 01/31/2012   SJM Accent SR RF implanted by DR Allred    Family History  Problem Relation Age of Onset  . Hypertension Mother   . Arthritis Mother   . Drug abuse Mother   . Stroke Father   . Heart disease Father   . Heart disease Sister 83  . Heart disease Brother   . Hypertension Son   . Heart disease Sister   . Neuropathy Brother   . Hypertension Brother   . Atrial fibrillation Brother   . Atrial fibrillation Sister   . Colon cancer Neg Hx     Allergies  Allergen Reactions  . Cardizem [Diltiazem]     Rash from Yellow dye in generic capsule. Can take different brand. Takes diltiazem - his allergy is to Cardizem  . Diltiazem Hcl     Rash from red dye in generic capsule. Can take different brand. Takes diltiazem - his allergy is to Cardizem Rash from red dye in generic capsule. Can take different brand. Takes diltiazem - his allergy is to Cardizem  . Sulfa Antibiotics     Unknown childhood reaction    Current Outpatient Medications on File Prior to Visit  Medication Sig Dispense Refill  . Cyanocobalamin (VITAMIN B 12 PO) Take 5,000 mcg by mouth.    . diltiazem (CARDIZEM CD) 240 MG 24 hr capsule TAKE ONE CAPSULE BY MOUTH DAILY 90 capsule 3  . gabapentin (NEURONTIN) 300 MG capsule TAKE 1 TO 2 CAPSULES BY MOUTH AT BEDTIME 60 capsule 0  . irbesartan-hydrochlorothiazide (AVALIDE) 150-12.5 MG tablet Take 1/2 (one-half) tablet by mouth once daily 45 tablet 0  . loratadine (CLARITIN) 10 MG tablet Take 10 mg by mouth daily as needed. Reported on 12/31/2015    . metoprolol tartrate (LOPRESSOR) 100 MG tablet TAKE 1 TABLET BY MOUTH TWICE DAILY 180 tablet 3  . Turmeric 500 MG TABS Take by mouth.    Alveda Reasons 20 MG TABS tablet TAKE  ONE TABLET BY MOUTH ONCE DAILY 90 tablet 3   No current facility-administered medications on file prior to visit.     BP 138/67   Pulse 73   Temp 97.6 F (36.4 C) (Oral)   Resp 16   Ht 6' (1.829 m)   Wt 216 lb (98 kg)   SpO2 100%   BMI 29.29 kg/m  Objective:   Physical Exam  General Mental Status- Alert. General Appearance- Not in acute distress.   Skin General: Color- Normal Color. Moisture- Normal Moisture.  Neck Carotid Arteries- Normal color. Moisture- Normal Moisture. No carotid bruits. No JVD.  Chest and Lung Exam Auscultation: Breath Sounds:-Normal.  Cardiovascular Auscultation:Rythm- Regular. Murmurs & Other Heart Sounds:Auscultation of the heart reveals- No Murmurs.  Abdomen Inspection:-Inspeection Normal. Palpation/Percussion:Note:No mass. Palpation and Percussion of the abdomen reveal- Non Tender, Non Distended + BS, no rebound or guarding.   Neurologic Cranial Nerve exam:- CN III-XII intact(No nystagmus), symmetric smile. Drift Test:- No drift. Romberg Exam:- Negative.  Heal to Toe Gait exam:-Normal. Finger to Nose:- Normal/Intact Strength:- 5/5 equal and symmetric strength both upper and lower extremities. Going from lying supine to sitting brief 15 second light headed sensation.      Assessment & Plan:  You do have some evidence of probable postural hypotension with associated lightheadedness.  Your neurologic exam is negative today.  Your heart rate has been normal although you do have history of chronic atrial fibrillation.  You do have some underlying hypertension as well.  Your blood pressure readings are at time low but you do also have a couple of high readings.  Presently over the next 5 days I want you to check your blood pressure daily.  But I do want you to get consecutive blood pressure readings 5 minutes apart for 3 readings each day.  I am curious to know if the third reading is showing low blood pressure levels.  It is possible  you we could incrementally decrease dose of 1 of your blood pressure medications but I would need to get various blood pressure readings before determining if that is a good idea or not.  Also want to get CBC and metabolic panel today to assess blood volume and assess her hydration status.  As both of those can affect blood pressure.  Follow-up in 6 days telephone visit or virtual visit.  If you have any severe lightheadedness  leading to fall, near syncope, syncope or any associated neurologic(or cardiac symptoms) then advised ED evaluation.  25 + minutes spent with patient.  50% of time spent counseling patient on approach/plan to current diagnoses.  Mackie Pai, PA-C

## 2019-03-07 NOTE — Patient Instructions (Addendum)
You do have some evidence of probable postural hypotension with associated lightheadedness.  Your neurologic exam is negative today.  Your heart rate has been normal although you do have history of chronic atrial fibrillation.  You do have some underlying hypertension as well.  Your blood pressure readings are at time low but you do also have a couple of high readings.  Presently over the next 5 days I want you to check your blood pressure daily.  But I do want you to get consecutive blood pressure readings 5 minutes apart for 3 readings each day.  I am curious to know if the third reading is showing low blood pressure levels.  It is possible you we could incrementally decrease dose of 1 of your blood pressure medications but I would need to get various blood pressure readings before determining if that is a good idea or not.  Also want to get CBC and metabolic panel today to assess blood volume and assess her hydration status.  As both of those can affect blood pressure.  Follow-up in 6 days telephone visit or virtual visit.  If you have any severe lightheadedness  leading to fall, near syncope, syncope or any associated neurologic(or cardiac symptoms) then advised ED evaluation.

## 2019-03-12 ENCOUNTER — Other Ambulatory Visit: Payer: Self-pay

## 2019-03-13 ENCOUNTER — Ambulatory Visit: Payer: PPO | Admitting: Medical

## 2019-03-13 ENCOUNTER — Ambulatory Visit (INDEPENDENT_AMBULATORY_CARE_PROVIDER_SITE_OTHER): Payer: PPO | Admitting: Medical

## 2019-03-13 ENCOUNTER — Encounter: Payer: Self-pay | Admitting: Medical

## 2019-03-13 VITALS — BP 134/79 | HR 67 | Temp 98.3°F | Resp 16 | Ht 72.0 in | Wt 216.4 lb

## 2019-03-13 DIAGNOSIS — R42 Dizziness and giddiness: Secondary | ICD-10-CM

## 2019-03-13 NOTE — Patient Instructions (Signed)
On review of your various blood pressure level readings it appears that most of the time your blood pressure is in very good range.  You get small percentage of mild elevated blood pressure but you max systolic is in the 115 range.  Looks like you never did go  above 160.  In addition you do have some mild postural hypotension on standing/position changes.  Only one was on the lower side with a reading of 81/60.  No anemia or dehydration on labs done on last visit.  Do want you to stay adequately hydrated as this could affect is occasional low blood pressure readings.  Since majority of your blood pressure readings are under control I do not think you need to make medication adjustment presently unless you start to get BP more BP spikes such as above 160.  If that were to occur would also want to get opinion from your cardiologist as to what EKGs intermittently.  Remember to be careful on position changes and get your balance before ambulating. Would consider using cane or walker if position change dizziness becomes more severe and frequent.  Follow-up as regular scheduled with PCP or as needed.

## 2019-03-13 NOTE — Progress Notes (Signed)
   Subjective:    Patient ID: Brian Davidson, male    DOB: 1936/05/24, 83 y.o.   MRN: [id number]  HPI  Pt come in for follow up/review of his bp both sitting and standing. He had some moderate elevated levels in past but dizziness with standing and changing positions.I wanted them to check bp various days sitting and standing. I was concerned if overtreat his high levels then he may bottom out with postural hypotension.  Pt majority of his readings sitting in 120-130/60 range about 80% of time. About 15% of readings 140-150 range over 80. About 5% of his readings when standing bp on low side 939-03 systolic. Diastolic lows 00'P when standing.  No syncope but transient light headed.  Pt high bp max was 155/82. One event standing up had 81/60  Pt had no anemia on last labs. Kidney function appeared stable. No indicators of acute dehydration.  Review of Systems     Objective:   Physical Exam  General Mental Status- Alert. General Appearance- Not in acute distress.   Skin General: Color- Normal Color. Moisture- Normal Moisture.  Neck Carotid Arteries- Normal color. Moisture- Normal Moisture. No carotid bruits. No JVD.  Chest and Lung Exam Auscultation: Breath Sounds:-Normal.  Cardiovascular Auscultation:Rythm- Regular, irregular.  Murmurs & Other Heart Sounds:Auscultation of the heart reveals- No Murmurs.      Neurologic Cranial Nerve exam:- CN III-XII intact(No nystagmus), symmetric smile.  Strength:- 5/5 equal and symmetric strength both upper and lower extremities.      Assessment & Plan:  On review of your various blood pressure level readings it appears that most of the time your blood pressure is in very good range.  You get small percentage of mild elevated blood pressure but you max systolic is in the 233 range.  Looks like you never did go  above 160.  In addition you do have some mild postural hypotension on standing/position changes.  Only one was on the  lower side with a reading of 81/60.  No anemia or dehydration on labs done on last visit.  Do want you to stay adequately hydrated as this could affect is occasional low blood pressure readings.  Since majority of your blood pressure readings are under control I do not think you need to make medication adjustment presently unless you start to get BP more BP spikes such as above 160.  If that were to occur would also want to get opinion from your cardiologist as to what EKGs intermittently.  Remember to be careful on position changes and get your balance before ambulating. Would consider using cane or walker if position change dizziness becomes more severe and frequent.  Follow-up as regular scheduled with PCP or as needed.  Mackie Pai, PA-C

## 2019-03-14 NOTE — Progress Notes (Signed)
Remote pacemaker transmission.   

## 2019-04-01 ENCOUNTER — Other Ambulatory Visit: Payer: Self-pay | Admitting: Family Medicine

## 2019-04-10 ENCOUNTER — Other Ambulatory Visit: Payer: Self-pay | Admitting: Family Medicine

## 2019-04-11 ENCOUNTER — Telehealth: Payer: Self-pay

## 2019-04-11 DIAGNOSIS — M25561 Pain in right knee: Secondary | ICD-10-CM | POA: Diagnosis not present

## 2019-04-11 DIAGNOSIS — M1711 Unilateral primary osteoarthritis, right knee: Secondary | ICD-10-CM | POA: Diagnosis not present

## 2019-04-15 ENCOUNTER — Telehealth: Payer: PPO | Admitting: Internal Medicine

## 2019-04-18 ENCOUNTER — Telehealth: Payer: Self-pay | Admitting: Family Medicine

## 2019-04-18 NOTE — Telephone Encounter (Signed)
Patient notified that samples are ready for pickup.  Only had 2 bottles 7 tablets.  Samples placed up front for pickup

## 2019-04-18 NOTE — Telephone Encounter (Signed)
Relation to CQ:PEAK Call back number: [email or web address]  Reason for call:  Patient checking on the status of xarelto 20MG  samples, patient requesting a follow up call, please advise  Copied from [place] [phone]. Topic: General - Other >> Apr 16, 2019  1:17 PM Celene Kras A wrote: Reason for CRM: Pt called and is requesting samples of xarelto. Please advise.

## 2019-04-25 ENCOUNTER — Telehealth: Payer: Self-pay | Admitting: *Deleted

## 2019-04-25 NOTE — Telephone Encounter (Signed)
Copied from [place] [phone]. Topic: General - Other >> Apr 16, 2019  1:17 PM Celene Kras A wrote: Reason for CRM: Pt called and is requesting samples of xarelto. Please advise.

## 2019-04-26 NOTE — Telephone Encounter (Signed)
Spoke with patient about medication pick up. Patient states he will come get sample today

## 2019-04-29 ENCOUNTER — Other Ambulatory Visit: Payer: Self-pay | Admitting: Family Medicine

## 2019-05-16 ENCOUNTER — Telehealth: Payer: Self-pay | Admitting: Internal Medicine

## 2019-05-16 NOTE — Telephone Encounter (Signed)
Call returned to Pt.  Explained why we were limiting physician's in the office.  Pt expresses he does not believe the virus is as serious as our govement says, etc. Etc.  Calmly explained that he could see an APP in office and they could order all his yearly tests.  Pt agreeable to APP appt.  Will schedule.

## 2019-05-16 NOTE — Telephone Encounter (Signed)
New message   I called pt to RS his appt he had scheduled on 09.14.20 with Dr. Rayann Heman for a virtual visit. Pt states that he did not want a virtual visit, he needed to come in for an appt because he hasn't had an echo or carotid in over a year and it normally gets checked. I informed him that as of right now due to covid Dr. Rayann Heman is doing virtual visits. He said he's mad because "everyone wears mask, and no one comes with me and I'm sure you all have on mask so I don't understand." He said his PCP sees him and they are [facility] so he doesn't understand. He would like a call from the nurse about when Dr. Rayann Heman would be in the office and an echo and carotid.

## 2019-05-17 ENCOUNTER — Other Ambulatory Visit: Payer: Self-pay

## 2019-05-20 ENCOUNTER — Encounter: Payer: PPO | Admitting: Internal Medicine

## 2019-05-20 ENCOUNTER — Other Ambulatory Visit: Payer: Self-pay

## 2019-05-20 ENCOUNTER — Other Ambulatory Visit (INDEPENDENT_AMBULATORY_CARE_PROVIDER_SITE_OTHER): Payer: PPO

## 2019-05-20 DIAGNOSIS — R739 Hyperglycemia, unspecified: Secondary | ICD-10-CM | POA: Diagnosis not present

## 2019-05-20 DIAGNOSIS — I10 Essential (primary) hypertension: Secondary | ICD-10-CM

## 2019-05-20 DIAGNOSIS — E519 Thiamine deficiency, unspecified: Secondary | ICD-10-CM

## 2019-05-20 DIAGNOSIS — E785 Hyperlipidemia, unspecified: Secondary | ICD-10-CM | POA: Diagnosis not present

## 2019-05-20 LAB — COMPREHENSIVE METABOLIC PANEL
ALT: 10 U/L (ref 0–53)
AST: 13 U/L (ref 0–37)
Albumin: 3.7 g/dL (ref 3.5–5.2)
Alkaline Phosphatase: 69 U/L (ref 39–117)
BUN: 22 mg/dL (ref 6–23)
CO2: 25 mEq/L (ref 19–32)
Calcium: 9.1 mg/dL (ref 8.4–10.5)
Chloride: 105 mEq/L (ref 96–112)
Creatinine, Ser: 1.26 mg/dL (ref 0.40–1.50)
GFR: 54.83 mL/min — ABNORMAL LOW (ref >60.00)
Glucose, Bld: 83 mg/dL (ref 70–99)
Potassium: 3.7 mEq/L (ref 3.5–5.1)
Sodium: 140 mEq/L (ref 135–145)
Total Bilirubin: 1 mg/dL (ref 0.2–1.2)
Total Protein: 6.4 g/dL (ref 6.0–8.3)

## 2019-05-20 LAB — HEMOGLOBIN A1C: Hgb A1c MFr Bld: 5.9 % (ref 4.6–6.5)

## 2019-05-20 LAB — CBC
HCT: 43.9 % (ref 39.0–52.0)
Hemoglobin: 14.7 g/dL (ref 13.0–17.0)
MCHC: 33.5 g/dL (ref 30.0–36.0)
MCV: 89.7 fl (ref 78.0–100.0)
Platelets: 165 10*3/uL (ref 150.0–400.0)
RBC: 4.89 Mil/uL (ref 4.22–5.81)
RDW: 15.4 % (ref 11.5–15.5)
WBC: 7.3 10*3/uL (ref 4.0–10.5)

## 2019-05-20 LAB — LIPID PANEL
Cholesterol: 157 mg/dL (ref 0–200)
HDL: 38.5 mg/dL — ABNORMAL LOW (ref >39.00)
LDL Cholesterol: 101 mg/dL — ABNORMAL HIGH (ref 0–99)
NonHDL: 118.77
Total CHOL/HDL Ratio: 4
Triglycerides: 87 mg/dL (ref 0.0–149.0)
VLDL: 17.4 mg/dL (ref 0.0–40.0)

## 2019-05-20 LAB — TSH: TSH: 1.82 u[IU]/mL (ref 0.35–4.50)

## 2019-05-21 ENCOUNTER — Ambulatory Visit (INDEPENDENT_AMBULATORY_CARE_PROVIDER_SITE_OTHER): Payer: PPO | Admitting: Family Medicine

## 2019-05-21 VITALS — BP 118/60 | HR 55 | Temp 95.9°F | Resp 18 | Ht 72.0 in | Wt 213.4 lb

## 2019-05-21 DIAGNOSIS — E785 Hyperlipidemia, unspecified: Secondary | ICD-10-CM

## 2019-05-21 DIAGNOSIS — I059 Rheumatic mitral valve disease, unspecified: Secondary | ICD-10-CM

## 2019-05-21 DIAGNOSIS — Z0000 Encounter for general adult medical examination without abnormal findings: Secondary | ICD-10-CM | POA: Diagnosis not present

## 2019-05-21 DIAGNOSIS — I4821 Permanent atrial fibrillation: Secondary | ICD-10-CM | POA: Diagnosis not present

## 2019-05-21 DIAGNOSIS — I779 Disorder of arteries and arterioles, unspecified: Secondary | ICD-10-CM

## 2019-05-21 DIAGNOSIS — I10 Essential (primary) hypertension: Secondary | ICD-10-CM

## 2019-05-21 DIAGNOSIS — E519 Thiamine deficiency, unspecified: Secondary | ICD-10-CM

## 2019-05-21 DIAGNOSIS — I739 Peripheral vascular disease, unspecified: Secondary | ICD-10-CM

## 2019-05-21 MED ORDER — GABAPENTIN 300 MG PO CAPS: 300.0000 mg | ORAL_CAPSULE | Freq: Two times a day (BID) | ORAL | 3 refills | Status: DC

## 2019-05-21 NOTE — Patient Instructions (Addendum)
Pulse Oximeter for home, want your oxygen levels in the 90s  Multivitamin with minerals with Selenium and Zinc  Hyaluronic Acid and call dermatology Preventive Care 65 Years and Older, Male Preventive care refers to lifestyle choices and visits with your health care provider that can promote health and wellness. This includes:  A yearly physical exam. This is also called an annual well check.  Regular dental and eye exams.  Immunizations.  Screening for certain conditions.  Healthy lifestyle choices, such as diet and exercise. What can I expect for my preventive care visit? Physical exam Your health care provider will check:  Height and weight. These may be used to calculate body mass index (BMI), which is a measurement that tells if you are at a healthy weight.  Heart rate and blood pressure.  Your skin for abnormal spots. Counseling Your health care provider may ask you questions about:  Alcohol, tobacco, and drug use.  Emotional well-being.  Home and relationship well-being.  Sexual activity.  Eating habits.  History of falls.  Memory and ability to understand (cognition).  Work and work Statistician. What immunizations do I need?  Influenza (flu) vaccine  This is recommended every year. Tetanus, diphtheria, and pertussis (Tdap) vaccine  You may need a Td booster every 10 years. Varicella (chickenpox) vaccine  You may need this vaccine if you have not already been vaccinated. Zoster (shingles) vaccine  You may need this after age 70. Pneumococcal conjugate (PCV13) vaccine  One dose is recommended after age 35. Pneumococcal polysaccharide (PPSV23) vaccine  One dose is recommended after age 74. Measles, mumps, and rubella (MMR) vaccine  You may need at least one dose of MMR if you were born in 1957 or later. You may also need a second dose. Meningococcal conjugate (MenACWY) vaccine  You may need this if you have certain conditions. Hepatitis A  vaccine  You may need this if you have certain conditions or if you travel or work in places where you may be exposed to hepatitis A. Hepatitis B vaccine  You may need this if you have certain conditions or if you travel or work in places where you may be exposed to hepatitis B. Haemophilus influenzae type b (Hib) vaccine  You may need this if you have certain conditions. You may receive vaccines as individual doses or as more than one vaccine together in one shot (combination vaccines). Talk with your health care provider about the risks and benefits of combination vaccines. What tests do I need? Blood tests  Lipid and cholesterol levels. These may be checked every 5 years, or more frequently depending on your overall health.  Hepatitis C test.  Hepatitis B test. Screening  Lung cancer screening. You may have this screening every year starting at age 15 if you have a 30-pack-year history of smoking and currently smoke or have quit within the past 15 years.  Colorectal cancer screening. All adults should have this screening starting at age 61 and continuing until age 22. Your health care provider may recommend screening at age 61 if you are at increased risk. You will have tests every 1-10 years, depending on your results and the type of screening test.  Prostate cancer screening. Recommendations will vary depending on your family history and other risks.  Diabetes screening. This is done by checking your blood sugar (glucose) after you have not eaten for a while (fasting). You may have this done every 1-3 years.  Abdominal aortic aneurysm (AAA) screening. You may need  this if you are a current or former smoker.  Sexually transmitted disease (STD) testing. Follow these instructions at home: Eating and drinking  Eat a diet that includes fresh fruits and vegetables, whole grains, lean protein, and low-fat dairy products. Limit your intake of foods with high amounts of sugar, saturated  fats, and salt.  Take vitamin and mineral supplements as recommended by your health care provider.  Do not drink alcohol if your health care provider tells you not to drink.  If you drink alcohol: ? Limit how much you have to 0-2 drinks a day. ? Be aware of how much alcohol is in your drink. In the [place] one drink equals one 12 oz bottle of beer (355 mL), one 5 oz glass of wine (148 mL), or one 1 oz glass of hard liquor (44 mL). Lifestyle  Take daily care of your teeth and gums.  Stay active. Exercise for at least 30 minutes on 5 or more days each week.  Do not use any products that contain nicotine or tobacco, such as cigarettes, e-cigarettes, and chewing tobacco. If you need help quitting, ask your health care provider.  If you are sexually active, practice safe sex. Use a condom or other form of protection to prevent STIs (sexually transmitted infections).  Talk with your health care provider about taking a low-dose aspirin or statin. What's next?  Visit your health care provider once a year for a well check visit.  Ask your health care provider how often you should have your eyes and teeth checked.  Stay up to date on all vaccines. This information is not intended to replace advice given to you by your health care provider. Make sure you discuss any questions you have with your health care provider. Document Released: 09/18/2015 Document Revised: 08/16/2018 Document Reviewed: 08/16/2018 Elsevier Patient Education  2020 [employer].

## 2019-05-21 NOTE — Assessment & Plan Note (Signed)
Well controlled, no changes to meds. Encouraged heart healthy diet such as the DASH diet and exercise as tolerated.  °

## 2019-05-21 NOTE — Assessment & Plan Note (Signed)
xarelto is tolerated no bleeding.

## 2019-05-21 NOTE — Assessment & Plan Note (Signed)
Repeat ultrasound today. asymptomatic

## 2019-05-21 NOTE — Assessment & Plan Note (Signed)
Supplement and monitor 

## 2019-05-21 NOTE — Assessment & Plan Note (Signed)
Will proceed with echo prior to cardiology follow up

## 2019-05-23 ENCOUNTER — Encounter: Payer: PPO | Admitting: Family Medicine

## 2019-05-24 LAB — VITAMIN B1: Vitamin B1 (Thiamine): 14 nmol/L (ref 8–30)

## 2019-05-24 NOTE — Assessment & Plan Note (Signed)
Patient encouraged to maintain heart healthy diet, regular exercise, adequate sleep. Consider daily probiotics. Take medications as prescribed. Labs ordered and reviewed 

## 2019-05-24 NOTE — Assessment & Plan Note (Signed)
Encouraged heart healthy diet, increase exercise, avoid trans fats, consider a krill oil cap daily 

## 2019-05-24 NOTE — Progress Notes (Signed)
Subjective:    Patient ID: Brian Davidson, male    DOB: 07-23-1936, 83 y.o.   MRN: [id number]  No chief complaint on file.   HPI Patient is in today for annual preventative exam and follow up on chronic medical concerns including hypertension, hyperlipidemia, afib, and thiamine deficiency. He reports over all he is doing well. No recent febrile illness or hospitalizations. He has had some episodes of vertigo but nothing severe. No falls, syncope or injury. No other neurologic complaints. He is maintaining quarantine well and trying to stay active. Is maintaining a heart healthy diet. Denies CP/palp/SOB/HA/congestion/fevers/GI or GU c/o. Taking meds as prescribed  Past Medical History:  Diagnosis Date  . Arthritis   . Biliary dyskinesia   . Carotid artery disease ([place]) 07/01/2016  . Dyslipidemia 07/13/2017  . GERD (gastroesophageal reflux disease)   . H/O measles   . History of chicken pox   . Hypertension   . Kidney stone 03/2015  . Persistent atrial fibrillation   . Pneumonia 1992  . Tachycardia-bradycardia ([place])    a. s/p STJ dual chamber pacemaker  . Thiamine deficiency 01/19/2018  . Valvular heart disease 07/01/2016    Past Surgical History:  Procedure Laterality Date  . CHOLECYSTECTOMY N/A 11/24/2015   Procedure: LAPAROSCOPIC CHOLECYSTECTOMY;  Surgeon: Greer Pickerel, MD;  Location: [facility];  Service: General;  Laterality: N/A;  . CYSTOSCOPY  2016  . FRACTURE SURGERY  2005   left ankle  . HERNIA REPAIR     left groin, no mesh  . PERMANENT PACEMAKER INSERTION N/A 01/31/2012   SJM Accent SR RF implanted by DR Allred    Family History  Problem Relation Age of Onset  . Hypertension Mother   . Arthritis Mother   . Drug abuse Mother   . Stroke Father   . Heart disease Father   . Heart disease Sister 36  . Heart disease Brother   . Hypertension Son   . Heart disease Sister   . Neuropathy Brother   . Hypertension Brother   . Atrial fibrillation Brother   . Atrial  fibrillation Sister   . Colon cancer Neg Hx     Social History   Socioeconomic History  . Marital status: Married    Spouse name: [name]  . Number of children: 1  . Years of education: Not on file  . Highest education level: Not on file  Occupational History  . Occupation: Retired  . Occupation: Dance movement psychotherapist for [employer]  . Financial resource strain: Not on file  . Food insecurity    Worry: Not on file    Inability: Not on file  . Transportation needs    Medical: Not on file    Non-medical: Not on file  Tobacco Use  . Smoking status: Former Smoker    Years: 10.00    Types: Pipe, Cigars    Quit date: 11/18/1985    Years since quitting: 33.5  . Smokeless tobacco: Never Used  Substance and Sexual Activity  . Alcohol use: Yes    Alcohol/week: 3.0 standard drinks    Types: 3 Cans of beer per week    Comment: occ  . Drug use: No  . Sexual activity: Never  Lifestyle  . Physical activity    Days per week: Not on file    Minutes per session: Not on file  . Stress: Not on file  Relationships  . Social connections    Talks on phone: Not on file  Gets together: Not on file    Attends religious service: Not on file    Active member of club or organization: Not on file    Attends meetings of clubs or organizations: Not on file    Relationship status: Not on file  . Intimate partner violence    Fear of current or ex partner: Not on file    Emotionally abused: Not on file    Physically abused: Not on file    Forced sexual activity: Not on file  Other Topics Concern  . Not on file  Social History Narrative   Lives with wife, still works part-time as a Geophysicist/field seismologist. He is active around the house and yard...   Parents are deceased and did not have cardiac issues but he has 2 siblings with atrial fibrillation and a sister-in-law  has a pacemaker.    Outpatient Medications Prior to Visit  Medication Sig Dispense Refill  . Cyanocobalamin (VITAMIN B 12 PO) Take  5,000 mcg by mouth.    . diltiazem (CARDIZEM CD) 240 MG 24 hr capsule TAKE ONE CAPSULE BY MOUTH DAILY 90 capsule 3  . irbesartan-hydrochlorothiazide (AVALIDE) 150-12.5 MG tablet Take 1/2 (one-half) tablet by mouth once daily 45 tablet 0  . loratadine (CLARITIN) 10 MG tablet Take 10 mg by mouth daily as needed. Reported on 12/31/2015    . metoprolol tartrate (LOPRESSOR) 100 MG tablet TAKE 1 TABLET BY MOUTH TWICE DAILY 180 tablet 3  . rivaroxaban (XARELTO) 20 MG TABS tablet Take 1 tablet (20 mg total) by mouth daily. 90 tablet 3  . Turmeric 500 MG TABS Take by mouth.    . gabapentin (NEURONTIN) 300 MG capsule TAKE 1 TO 2 CAPSULES BY MOUTH ONCE DAILY AT BEDTIME 60 capsule 0   No facility-administered medications prior to visit.     Allergies  Allergen Reactions  . Cardizem [Diltiazem]     Rash from Yellow dye in generic capsule. Can take different brand. Takes diltiazem - his allergy is to Cardizem  . Diltiazem Hcl     Rash from red dye in generic capsule. Can take different brand. Takes diltiazem - his allergy is to Cardizem Rash from red dye in generic capsule. Can take different brand. Takes diltiazem - his allergy is to Cardizem  . Sulfa Antibiotics     Unknown childhood reaction    Review of Systems  Constitutional: Negative for chills, fever and malaise/fatigue.  HENT: Negative for congestion and hearing loss.   Eyes: Negative for discharge.  Respiratory: Negative for cough, sputum production and shortness of breath.   Cardiovascular: Negative for chest pain, palpitations and leg swelling.  Gastrointestinal: Negative for abdominal pain, blood in stool, constipation, diarrhea, heartburn, nausea and vomiting.  Genitourinary: Negative for dysuria, frequency, hematuria and urgency.  Musculoskeletal: Negative for back pain, falls and myalgias.  Skin: Negative for rash.  Neurological: Positive for dizziness. Negative for sensory change, loss of consciousness, weakness and  headaches.  Endo/Heme/Allergies: Negative for environmental allergies. Does not bruise/bleed easily.  Psychiatric/Behavioral: Negative for depression and suicidal ideas. The patient is not nervous/anxious and does not have insomnia.        Objective:    Physical Exam Vitals signs and nursing note reviewed.  Constitutional:      General: He is not in acute distress.    Appearance: He is well-developed.  HENT:     Head: Normocephalic and atraumatic.     Right Ear: Tympanic membrane and ear canal normal.     Left  Ear: Tympanic membrane and ear canal normal.     Nose: Nose normal.  Eyes:     General:        Right eye: No discharge.        Left eye: No discharge.     Extraocular Movements: Extraocular movements intact.     Conjunctiva/sclera: Conjunctivae normal.     Pupils: Pupils are equal, round, and reactive to light.  Neck:     Musculoskeletal: Normal range of motion and neck supple.  Cardiovascular:     Rate and Rhythm: Normal rate. Rhythm irregular.     Heart sounds: No murmur.  Pulmonary:     Effort: Pulmonary effort is normal.     Breath sounds: Normal breath sounds. No wheezing.  Abdominal:     General: Bowel sounds are normal.     Palpations: Abdomen is soft. There is no mass.     Tenderness: There is no abdominal tenderness. There is no guarding.  Musculoskeletal: Normal range of motion.        General: No swelling or tenderness.     Right lower leg: No edema.     Left lower leg: No edema.  Skin:    General: Skin is warm and dry.     Findings: No erythema.  Neurological:     Mental Status: He is alert and oriented to person, place, and time.  Psychiatric:        Mood and Affect: Mood normal.        Behavior: Behavior normal.     BP 118/60 (BP Location: Left Arm, Patient Position: Sitting, Cuff Size: Normal)   Pulse (!) 55   Temp (!) 95.9 F (35.5 C) (Temporal)   Resp 18   Ht 6' (1.829 m)   Wt 213 lb 6.4 oz (96.8 kg)   SpO2 98%   BMI 28.94 kg/m  Wt  Readings from Last 3 Encounters:  05/21/19 213 lb 6.4 oz (96.8 kg)  03/13/19 216 lb 6.4 oz (98.2 kg)  03/07/19 216 lb (98 kg)    Diabetic Foot Exam - Simple   No data filed     Lab Results  Component Value Date   WBC 7.3 05/20/2019   HGB 14.7 05/20/2019   HCT 43.9 05/20/2019   PLT 165.0 05/20/2019   GLUCOSE 83 05/20/2019   CHOL 157 05/20/2019   TRIG 87.0 05/20/2019   HDL 38.50 (L) 05/20/2019   LDLCALC 101 (H) 05/20/2019   ALT 10 05/20/2019   AST 13 05/20/2019   NA 140 05/20/2019   K 3.7 05/20/2019   CL 105 05/20/2019   CREATININE 1.26 05/20/2019   BUN 22 05/20/2019   CO2 25 05/20/2019   TSH 1.82 05/20/2019   INR 1.20 11/24/2015   HGBA1C 5.9 05/20/2019    Lab Results  Component Value Date   TSH 1.82 05/20/2019   Lab Results  Component Value Date   WBC 7.3 05/20/2019   HGB 14.7 05/20/2019   HCT 43.9 05/20/2019   MCV 89.7 05/20/2019   PLT 165.0 05/20/2019   Lab Results  Component Value Date   NA 140 05/20/2019   K 3.7 05/20/2019   CO2 25 05/20/2019   GLUCOSE 83 05/20/2019   BUN 22 05/20/2019   CREATININE 1.26 05/20/2019   BILITOT 1.0 05/20/2019   ALKPHOS 69 05/20/2019   AST 13 05/20/2019   ALT 10 05/20/2019   PROT 6.4 05/20/2019   ALBUMIN 3.7 05/20/2019   CALCIUM 9.1 05/20/2019   ANIONGAP 8 11/19/2015  GFR 54.83 (L) 05/20/2019   Lab Results  Component Value Date   CHOL 157 05/20/2019   Lab Results  Component Value Date   HDL 38.50 (L) 05/20/2019   Lab Results  Component Value Date   LDLCALC 101 (H) 05/20/2019   Lab Results  Component Value Date   TRIG 87.0 05/20/2019   Lab Results  Component Value Date   CHOLHDL 4 05/20/2019   Lab Results  Component Value Date   HGBA1C 5.9 05/20/2019       Assessment & Plan:   Problem List Items Addressed This Visit    Permanent atrial fibrillation - Primary    xarelto is tolerated no bleeding.       Relevant Orders   Ambulatory referral to Cardiology   Hypertension    Well controlled,  no changes to meds. Encouraged heart healthy diet such as the DASH diet and exercise as tolerated.       Relevant Orders   Ambulatory referral to Cardiology   Carotid artery disease ([place])    Repeat ultrasound today. asymptomatic      Relevant Orders   US Carotid Bilateral   Ambulatory referral to Cardiology   VAS US CAROTID   Mitral valve disorder    Will proceed with echo prior to cardiology follow up      Relevant Orders   ECHOCARDIOGRAM COMPLETE   Ambulatory referral to Cardiology   Preventative health care    Patient encouraged to maintain heart healthy diet, regular exercise, adequate sleep. Consider daily probiotics. Take medications as prescribed. Labs ordered and reviewed.       Dyslipidemia    Encouraged heart healthy diet, increase exercise, avoid trans fats, consider a krill oil cap daily      Thiamine deficiency    Supplement and monitor         I have discontinued [name] "[name]"'s gabapentin. I am also having him start on gabapentin. Additionally, I am having him maintain his loratadine, Turmeric, metoprolol tartrate, diltiazem, Cyanocobalamin (VITAMIN B 12 PO), rivaroxaban, and irbesartan-hydrochlorothiazide.  Meds ordered this encounter  Medications  . gabapentin (NEURONTIN) 300 MG capsule    Sig: Take 1 capsule (300 mg total) by mouth 2 (two) times daily.    Dispense:  180 capsule    Refill:  3    D/C previous script     [name], MD

## 2019-05-29 ENCOUNTER — Ambulatory Visit (HOSPITAL_BASED_OUTPATIENT_CLINIC_OR_DEPARTMENT_OTHER)
Admission: RE | Admit: 2019-05-29 | Discharge: 2019-05-29 | Disposition: A | Discharge status: Home / Self Care | Payer: PPO | Source: Ambulatory Visit | Attending: Family Medicine | Admitting: Family Medicine

## 2019-05-29 ENCOUNTER — Other Ambulatory Visit: Payer: Self-pay

## 2019-05-29 DIAGNOSIS — I739 Peripheral vascular disease, unspecified: Secondary | ICD-10-CM

## 2019-05-29 DIAGNOSIS — I059 Rheumatic mitral valve disease, unspecified: Secondary | ICD-10-CM | POA: Diagnosis not present

## 2019-05-29 DIAGNOSIS — I779 Disorder of arteries and arterioles, unspecified: Secondary | ICD-10-CM

## 2019-05-29 NOTE — Progress Notes (Signed)
  Echocardiogram 2D Echocardiogram has been performed.  Brian Davidson 05/29/2019, 10:41 AM

## 2019-05-29 NOTE — Progress Notes (Signed)
Bilateral carotid doppler performed   05/29/19 Cardell Peach RDCS, RVT

## 2019-05-31 ENCOUNTER — Other Ambulatory Visit: Payer: Self-pay

## 2019-05-31 ENCOUNTER — Ambulatory Visit (INDEPENDENT_AMBULATORY_CARE_PROVIDER_SITE_OTHER): Payer: PPO | Admitting: Student

## 2019-05-31 VITALS — BP 141/87 | HR 71 | Ht 72.0 in | Wt 218.0 lb

## 2019-05-31 DIAGNOSIS — I779 Disorder of arteries and arterioles, unspecified: Secondary | ICD-10-CM

## 2019-05-31 DIAGNOSIS — Z950 Presence of cardiac pacemaker: Secondary | ICD-10-CM | POA: Diagnosis not present

## 2019-05-31 DIAGNOSIS — I4821 Permanent atrial fibrillation: Secondary | ICD-10-CM | POA: Diagnosis not present

## 2019-05-31 DIAGNOSIS — I739 Peripheral vascular disease, unspecified: Secondary | ICD-10-CM | POA: Diagnosis not present

## 2019-05-31 DIAGNOSIS — I10 Essential (primary) hypertension: Secondary | ICD-10-CM

## 2019-05-31 DIAGNOSIS — R001 Bradycardia, unspecified: Secondary | ICD-10-CM

## 2019-05-31 LAB — CUP PACEART INCLINIC DEVICE CHECK
Battery Remaining Longevity: 135 mo
Battery Voltage: 2.96 V
Brady Statistic RV Percent Paced: 10 %
Date Time Interrogation Session: 20200925130504
Implantable Lead Implant Date: 20130528
Implantable Lead Location: 753860
Implantable Lead Model: 1948
Implantable Pulse Generator Implant Date: 20130528
Lead Channel Impedance Value: 662.5 Ohm
Lead Channel Pacing Threshold Amplitude: 0.75 V
Lead Channel Pacing Threshold Amplitude: 0.75 V
Lead Channel Pacing Threshold Pulse Width: 0.4 ms
Lead Channel Pacing Threshold Pulse Width: 0.4 ms
Lead Channel Sensing Intrinsic Amplitude: 12 mV
Lead Channel Setting Pacing Amplitude: 2.5 V
Lead Channel Setting Pacing Pulse Width: 0.4 ms
Lead Channel Setting Sensing Sensitivity: 2 mV
Pulse Gen Model: 1210
Pulse Gen Serial Number: 7328888

## 2019-05-31 NOTE — Patient Instructions (Signed)
Medication Instructions:  Your physician recommends that you continue on your current medications as directed. Please refer to the Current Medication list given to you today  If you need a refill on your cardiac medications before your next appointment, please call your pharmacy.   Lab work: NONE ORDERED  TODAY   If you have labs (blood work) drawn today and your tests are completely normal, you will receive your results only by: Marland Kitchen MyChart Message (if you have MyChart) OR . A paper copy in the mail If you have any lab test that is abnormal or we need to change your treatment, we will call you to review the results.  Testing/Procedures: NONE ORDERED  TODAY   Follow-Up: needs referral for general cardiology in 6 months for HYPERTENSION AND CAD   At [facility], you and your health needs are our priority.  As part of our continuing mission to provide you with exceptional heart care, we have created designated Provider Care Teams.  These Care Teams include your primary Cardiologist (physician) and Advanced Practice Providers (APPs -  Physician Assistants and Nurse Practitioners) who all work together to provide you with the care you need, when you need it. You will need a follow up appointment in 1 years.  Please call our office 2 months in advance to schedule this appointment.  You may see Thompson Grayer, MD  or one of the following Advanced Practice Providers on your designated Care Team:   Chanetta Marshall, NP . Tommye Standard, PA-C  Any Other Special Instructions Will Be Listed Below (If Applicable).

## 2019-05-31 NOTE — Progress Notes (Signed)
Electrophysiology Office Note Date: 05/31/2019  ID:  Brian Davidson, DOB 1935-12-16, MRN [id number]  PCP: Mosie Lukes, MD Primary Cardiologist: No primary care provider on file. Electrophysiologist: Thompson Grayer, MD   CC: Pacemaker follow-up  Brian Davidson is a 83 y.o. male seen today for Dr. Rayann Heman. he presents today for routine electrophysiology followup.  Since last being seen in our clinic, the patient reports doing very well overall. He has labile BP and continues to have lightheadedness at times. This is usually with rapid standing, but is not readily reproducible. He denies chest pain, palpitations, dyspnea, PND, orthopnea, nausea, vomiting, syncope, edema, weight gain, or early satiety.  Echo from 05/29/2019 personally reviewed. Shows LVEF 60-65%, normal RV, Mod LAE, Mod MS. Moderate AA dilation at 43 mm.  Device History: St. Jude Single Chamber PPM implanted 01/2012 for symptomatic bradycardia  Past Medical History:  Diagnosis Date   Arthritis    Biliary dyskinesia    Carotid artery disease ([place]) 07/01/2016   Dyslipidemia 07/13/2017   GERD (gastroesophageal reflux disease)    H/O measles    History of chicken pox    Hypertension    Kidney stone 03/2015   Persistent atrial fibrillation    Pneumonia 1992   Tachycardia-bradycardia ([place])    a. s/p STJ dual chamber pacemaker   Thiamine deficiency 01/19/2018   Valvular heart disease 07/01/2016   Past Surgical History:  Procedure Laterality Date   CHOLECYSTECTOMY N/A 11/24/2015   Procedure: LAPAROSCOPIC CHOLECYSTECTOMY;  Surgeon: Greer Pickerel, MD;  Location: [facility];  Service: General;  Laterality: N/A;   CYSTOSCOPY  2016   FRACTURE SURGERY  2005   left ankle   HERNIA REPAIR     left groin, no mesh   PERMANENT PACEMAKER INSERTION N/A 01/31/2012   SJM Accent SR RF implanted by DR Allred    Current Outpatient Medications  Medication Sig Dispense Refill   Cyanocobalamin (VITAMIN B 12 PO) Take  5,000 mcg by mouth.     diltiazem (CARDIZEM CD) 240 MG 24 hr capsule TAKE ONE CAPSULE BY MOUTH DAILY 90 capsule 3   gabapentin (NEURONTIN) 300 MG capsule Take 1 capsule (300 mg total) by mouth 2 (two) times daily. 180 capsule 3   irbesartan-hydrochlorothiazide (AVALIDE) 150-12.5 MG tablet Take 1/2 (one-half) tablet by mouth once daily 45 tablet 0   loratadine (CLARITIN) 10 MG tablet Take 10 mg by mouth daily as needed. Reported on 12/31/2015     metoprolol tartrate (LOPRESSOR) 100 MG tablet TAKE 1 TABLET BY MOUTH TWICE DAILY 180 tablet 3   rivaroxaban (XARELTO) 20 MG TABS tablet Take 1 tablet (20 mg total) by mouth daily. 90 tablet 3   Turmeric 500 MG TABS Take by mouth.     No current facility-administered medications for this visit.     Allergies:   Cardizem [diltiazem], Diltiazem hcl, and Sulfa antibiotics   Social History: Social History   Socioeconomic History   Marital status: Married    Spouse name: [name]   Number of children: 1   Years of education: Not on file   Highest education level: Not on file  Occupational History   Occupation: Retired   Occupation: Dance movement psychotherapist for [employer] strain: Not on file   Food insecurity    Worry: Not on file    Inability: Not on file   Transportation needs    Medical: Not on file    Non-medical: Not on file  Tobacco  Use   Smoking status: Former Smoker    Years: 10.00    Types: Pipe, Cigars    Quit date: 11/18/1985    Years since quitting: 33.5   Smokeless tobacco: Never Used  Substance and Sexual Activity   Alcohol use: Yes    Alcohol/week: 3.0 standard drinks    Types: 3 Cans of beer per week    Comment: occ   Drug use: No   Sexual activity: Never  Lifestyle   Physical activity    Days per week: Not on file    Minutes per session: Not on file   Stress: Not on file  Relationships   Social connections    Talks on phone: Not on file    Gets together: Not on  file    Attends religious service: Not on file    Active member of club or organization: Not on file    Attends meetings of clubs or organizations: Not on file    Relationship status: Not on file   Intimate partner violence    Fear of current or ex partner: Not on file    Emotionally abused: Not on file    Physically abused: Not on file    Forced sexual activity: Not on file  Other Topics Concern   Not on file  Social History Narrative   Lives with wife, still works part-time as a Geophysicist/field seismologist. He is active around the house and yard...   Parents are deceased and did not have cardiac issues but he has 2 siblings with atrial fibrillation and a sister-in-law  has a pacemaker.    Family History: Family History  Problem Relation Age of Onset   Hypertension Mother    Arthritis Mother    Drug abuse Mother    Stroke Father    Heart disease Father    Heart disease Sister 32   Heart disease Brother    Hypertension Son    Heart disease Sister    Neuropathy Brother    Hypertension Brother    Atrial fibrillation Brother    Atrial fibrillation Sister    Colon cancer Neg Hx      Review of Systems: All other systems reviewed and are otherwise negative except as noted above.  Physical Exam: Vitals:   05/31/19 1224  BP: (!) 141/87  Pulse: 71  Weight: 218 lb (98.9 kg)  Height: 6' (1.829 m)     GEN- The patient is well appearing, alert and oriented x 3 today.   HEENT: normocephalic, atraumatic; sclera clear, conjunctiva pink; hearing intact; oropharynx clear; neck supple  Lungs- Clear to ausculation bilaterally, normal work of breathing.  No wheezes, rales, rhonchi Heart- Regular rate and rhythm, no murmurs, rubs or gallops  GI- soft, non-tender, non-distended, bowel sounds present  Extremities- no clubbing, cyanosis, or edema  MS- no significant deformity or atrophy Skin- warm and dry, no rash or lesion; PPM pocket well healed Psych- euthymic mood, full affect Neuro-  strength and sensation are intact  PPM Interrogation- reviewed in detail today,  See PACEART report  EKG:  EKG is not ordered today. The ekg 09/06/2018 shows Afib at 61 bpm (permanent afib)  Recent Labs: 05/20/2019: ALT 10; BUN 22; Creatinine, Ser 1.26; Hemoglobin 14.7; Platelets 165.0; Potassium 3.7; Sodium 140; TSH 1.82   Wt Readings from Last 3 Encounters:  05/31/19 218 lb (98.9 kg)  05/21/19 213 lb 6.4 oz (96.8 kg)  03/13/19 216 lb 6.4 oz (98.2 kg)     Other studies Reviewed:  Additional studies/ records that were reviewed today include: Echo 05/29/19 as above shows LVEF 60-65%, Previous EP office notes, Previous remote checks, Most recent labwork.   Assessment and Plan:  1.  Symptomatic bradycardia s/p St. Jude PPM  Normal PPM function See Pace Art report No changes today  2. Permanent Afib Rate controlled.  Continue Xarelto for CHA2DS2VASC of 4 (HTN, Age, vascular disease;carotids)  3. HTN Continue current regimen BP labile with occasional lightheadedness. BP runs 110-120s at home. No falls. Continue to follow.   4. Carotid artery disease Dopplers from 07/2016 reviewed which showed < 50% bilaterally.  Repeats pending.  Will get follow up for gen cards for continued management.   5. Moderate ascending aorta dilation 43 mm by echo 05/29/2019. This is stable from 07/2016 echo.    Current medicines are reviewed at length with the patient today.   The patient does not have concerns regarding his medicines.  The following changes were made today:  none  Labs/ tests ordered today include:  Orders Placed This Encounter  Procedures   CUP [place]   Disposition:   Follow up with Dr. Rayann Heman in 12 Months   Signed, Shirley Friar, PA-C  05/31/2019 1:05 PM  [address] [place] [address] [phone] (office) [phone] (fax)

## 2019-05-31 NOTE — Progress Notes (Signed)
Thank you :)

## 2019-06-04 ENCOUNTER — Ambulatory Visit (INDEPENDENT_AMBULATORY_CARE_PROVIDER_SITE_OTHER): Payer: PPO | Admitting: *Deleted

## 2019-06-04 DIAGNOSIS — I495 Sick sinus syndrome: Secondary | ICD-10-CM

## 2019-06-04 LAB — CUP PACEART REMOTE DEVICE CHECK
Battery Remaining Longevity: 133 mo
Battery Remaining Percentage: 95.5 %
Battery Voltage: 2.96 V
Brady Statistic RV Percent Paced: 6.7 %
Date Time Interrogation Session: 20200928074941
Implantable Lead Implant Date: 20130528
Implantable Lead Location: 753860
Implantable Lead Model: 1948
Implantable Pulse Generator Implant Date: 20130528
Lead Channel Impedance Value: 660 Ohm
Lead Channel Pacing Threshold Amplitude: 0.75 V
Lead Channel Pacing Threshold Pulse Width: 0.4 ms
Lead Channel Sensing Intrinsic Amplitude: 12 mV
Lead Channel Setting Pacing Amplitude: 2.5 V
Lead Channel Setting Pacing Pulse Width: 0.4 ms
Lead Channel Setting Sensing Sensitivity: 2 mV
Pulse Gen Model: 1210
Pulse Gen Serial Number: 7328888

## 2019-06-05 ENCOUNTER — Ambulatory Visit (HOSPITAL_BASED_OUTPATIENT_CLINIC_OR_DEPARTMENT_OTHER)
Admission: RE | Admit: 2019-06-05 | Discharge: 2019-06-05 | Disposition: A | Discharge status: Home / Self Care | Payer: PPO | Source: Ambulatory Visit | Attending: Medical | Admitting: Medical

## 2019-06-05 ENCOUNTER — Ambulatory Visit (INDEPENDENT_AMBULATORY_CARE_PROVIDER_SITE_OTHER): Payer: PPO | Admitting: Medical

## 2019-06-05 ENCOUNTER — Encounter: Payer: Self-pay | Admitting: Medical

## 2019-06-05 ENCOUNTER — Other Ambulatory Visit: Payer: Self-pay

## 2019-06-05 VITALS — BP 156/91 | HR 56 | Temp 97.0°F | Resp 16 | Ht 72.0 in | Wt 216.8 lb

## 2019-06-05 DIAGNOSIS — R109 Unspecified abdominal pain: Secondary | ICD-10-CM

## 2019-06-05 DIAGNOSIS — M549 Dorsalgia, unspecified: Secondary | ICD-10-CM | POA: Insufficient documentation

## 2019-06-05 LAB — POC URINALSYSI DIPSTICK (AUTOMATED)
Bilirubin, UA: NEGATIVE
Blood, UA: NEGATIVE
Glucose, UA: NEGATIVE
Ketones, UA: NEGATIVE
Leukocytes, UA: NEGATIVE
Nitrite, UA: NEGATIVE
Protein, UA: NEGATIVE
Spec Grav, UA: 1.01 (ref 1.010–1.025)
Urobilinogen, UA: NEGATIVE E.U./dL — AB
pH, UA: 6.5 (ref 5.0–8.0)

## 2019-06-05 MED ORDER — HYDROCODONE-ACETAMINOPHEN 5-325 MG PO TABS: 1.0000 | ORAL_TABLET | Freq: Four times a day (QID) | ORAL | 0 refills | Status: DC | PRN

## 2019-06-05 MED ORDER — CIPROFLOXACIN HCL 500 MG PO TABS: 500.0000 mg | ORAL_TABLET | Freq: Two times a day (BID) | ORAL | 0 refills | Status: DC

## 2019-06-05 MED ORDER — METRONIDAZOLE 500 MG PO TABS: 500.0000 mg | ORAL_TABLET | Freq: Three times a day (TID) | ORAL | 0 refills | Status: DC

## 2019-06-05 NOTE — Patient Instructions (Addendum)
Your location of pain and severity last night causes concern for kidney stone but you also have known diverticulosis on review of your prior imaging studies.  Will rx cipro and flagyl to cover diverticulitis.  Will make norco available for pain in event of kidney stone.  Presently will get 1 view abd xray to see if stone seen. Hydrate well.  If pain persists despite the above then would recommend ct abd/pelvis.  If severe pain over weekend then ED evaluation.  Cbc and cmp today.  Follow up 7 days or as needed

## 2019-06-05 NOTE — Progress Notes (Signed)
Subjective:    Patient ID: Brian Davidson, male    DOB: 12-10-1935, 83 y.o.   MRN: [id number]  HPI  Patient here with back pain left cva area.   Pain started about 3 days ago. Pain is dull and located in left lower back and radiates around to groin. Right now pain is 4/10, but can increase to 8/10 at times. No specific triggers to worse pain. Pain keeps him up at night. Slight improvement with walking. He reports that it feels similar to when he had kidney stones about 2 years. He has not tried anything OTC for pain.  No limited range of motion or increased pain with any certain movements.  He has hard time falling asleep due to pain.   2 ct of renal stone studies neg in 2017. No stones but diverticulosis.   Review of Systems  Constitutional: Negative for chills, fatigue and fever.  Respiratory: Negative for chest tightness and shortness of breath.   Cardiovascular: Negative for chest pain.  Gastrointestinal: Negative for abdominal pain, blood in stool, constipation, diarrhea and nausea.  Genitourinary: Positive for flank pain. Negative for difficulty urinating, dysuria, frequency, hematuria and urgency.       Left-sided flank pain  Musculoskeletal: Positive for back pain.       Left lower back pain x3 days, constant dull pain that improves somewhat with walking. Currently 4/10 on pain scale but can get up to 8/10. Pain radiates around left side to groin.   Neurological: Negative for dizziness and light-headedness.  Hematological: Negative for adenopathy. Does not bruise/bleed easily.  Psychiatric/Behavioral: Positive for sleep disturbance.       Trouble sleeping for the past 2 nights due to pain    Past Medical History:  Diagnosis Date  . Arthritis   . Biliary dyskinesia   . Carotid artery disease ([place]) 07/01/2016  . Dyslipidemia 07/13/2017  . GERD (gastroesophageal reflux disease)   . H/O measles   . History of chicken pox   . Hypertension   . Kidney stone 03/2015  .  Persistent atrial fibrillation   . Pneumonia 1992  . Tachycardia-bradycardia ([place])    a. s/p STJ dual chamber pacemaker  . Thiamine deficiency 01/19/2018  . Valvular heart disease 07/01/2016     Social History   Socioeconomic History  . Marital status: Married    Spouse name: [name]  . Number of children: 1  . Years of education: Not on file  . Highest education level: Not on file  Occupational History  . Occupation: Retired  . Occupation: Dance movement psychotherapist for [employer]  . Financial resource strain: Not on file  . Food insecurity    Worry: Not on file    Inability: Not on file  . Transportation needs    Medical: Not on file    Non-medical: Not on file  Tobacco Use  . Smoking status: Former Smoker    Years: 10.00    Types: Pipe, Cigars    Quit date: 11/18/1985    Years since quitting: 33.5  . Smokeless tobacco: Never Used  Substance and Sexual Activity  . Alcohol use: Yes    Alcohol/week: 3.0 standard drinks    Types: 3 Cans of beer per week    Comment: occ  . Drug use: No  . Sexual activity: Never  Lifestyle  . Physical activity    Days per week: Not on file    Minutes per session: Not on file  . Stress: Not  on file  Relationships  . Social Herbalist on phone: Not on file    Gets together: Not on file    Attends religious service: Not on file    Active member of club or organization: Not on file    Attends meetings of clubs or organizations: Not on file    Relationship status: Not on file  . Intimate partner violence    Fear of current or ex partner: Not on file    Emotionally abused: Not on file    Physically abused: Not on file    Forced sexual activity: Not on file  Other Topics Concern  . Not on file  Social History Narrative   Lives with wife, still works part-time as a Geophysicist/field seismologist. He is active around the house and yard...   Parents are deceased and did not have cardiac issues but he has 2 siblings with atrial fibrillation and a  sister-in-law  has a pacemaker.    Past Surgical History:  Procedure Laterality Date  . CHOLECYSTECTOMY N/A 11/24/2015   Procedure: LAPAROSCOPIC CHOLECYSTECTOMY;  Surgeon: Greer Pickerel, MD;  Location: [facility];  Service: General;  Laterality: N/A;  . CYSTOSCOPY  2016  . FRACTURE SURGERY  2005   left ankle  . HERNIA REPAIR     left groin, no mesh  . PERMANENT PACEMAKER INSERTION N/A 01/31/2012   SJM Accent SR RF implanted by DR Allred    Family History  Problem Relation Age of Onset  . Hypertension Mother   . Arthritis Mother   . Drug abuse Mother   . Stroke Father   . Heart disease Father   . Heart disease Sister 54  . Heart disease Brother   . Hypertension Son   . Heart disease Sister   . Neuropathy Brother   . Hypertension Brother   . Atrial fibrillation Brother   . Atrial fibrillation Sister   . Colon cancer Neg Hx     Allergies  Allergen Reactions  . Cardizem [Diltiazem]     Rash from Yellow dye in generic capsule. Can take different brand. Takes diltiazem - his allergy is to Cardizem  . Diltiazem Hcl     Rash from red dye in generic capsule. Can take different brand. Takes diltiazem - his allergy is to Cardizem Rash from red dye in generic capsule. Can take different brand. Takes diltiazem - his allergy is to Cardizem  . Sulfa Antibiotics     Unknown childhood reaction    Current Outpatient Medications on File Prior to Visit  Medication Sig Dispense Refill  . Cyanocobalamin (VITAMIN B 12 PO) Take 5,000 mcg by mouth.    . diltiazem (CARDIZEM CD) 240 MG 24 hr capsule TAKE ONE CAPSULE BY MOUTH DAILY 90 capsule 3  . gabapentin (NEURONTIN) 300 MG capsule Take 1 capsule (300 mg total) by mouth 2 (two) times daily. 180 capsule 3  . irbesartan-hydrochlorothiazide (AVALIDE) 150-12.5 MG tablet Take 1/2 (one-half) tablet by mouth once daily 45 tablet 0  . loratadine (CLARITIN) 10 MG tablet Take 10 mg by mouth daily as needed. Reported on 12/31/2015    . metoprolol  tartrate (LOPRESSOR) 100 MG tablet TAKE 1 TABLET BY MOUTH TWICE DAILY 180 tablet 3  . rivaroxaban (XARELTO) 20 MG TABS tablet Take 1 tablet (20 mg total) by mouth daily. 90 tablet 3  . Turmeric 500 MG TABS Take by mouth.     No current facility-administered medications on file prior to visit.  BP (!) 156/91   Pulse (!) 56   Temp (!) 97 F (36.1 C) (Temporal)   Resp 16   Ht 6' (1.829 m)   Wt 216 lb 12.8 oz (98.3 kg)   SpO2 99%   BMI 29.40 kg/m  .     Objective:   Physical Exam  General Appearance- Not in acute distress.  HEENT Eyes- Scleraeral/Conjuntiva-bilat- Not Yellow. Mouth & Throat- Normal.  Chest and Lung Exam Auscultation: Breath sounds:-Normal. Adventitious sounds:- No Adventitious sounds.  Cardiovascular Auscultation:Rythm - Regular. Heart Sounds -Normal heart sounds.  Abdomen Inspection:-Inspection Normal.  Palpation/Perucssion: Palpation and Percussion of the abdomen reveal- Non Tender, No Rebound tenderness, No rigidity(Guarding) and No Palpable abdominal masses.  Liver:-Normal.  Spleen:- Normal.    Back- mid left cva tendernes faint.  Abdomen- soft, nontender, nondistended. +bs. No rebound or guarding.(but points to flank area as pain region last night)      Assessment & Plan:  Your location of pain and severity last night causes concern for kidney stone but you also have known diverticulosis on review of your prior imaging studies.  Will rx cipro and flagyl to cover diverticulitis.  Will make norco available for pain in event of kidney stone.  Presently will get 1 view abd xray to see if stone seen. Hydrate well.  If pain persists despite the above then would recommend ct abd/pelvis.  If severe pain over weekend then ED evaluation.  Cbc and cmp today.  Follow up 7 days or as needed  25 minutes spent with pt. 50% of time spent counseling on plan going forward. NP studentTaylor Olevia Bowens interviewed pt first. Then I evaluated pt. Tx decision  made by myself.  Mackie Pai, PA-C

## 2019-06-06 LAB — COMPREHENSIVE METABOLIC PANEL
ALT: 12 U/L (ref 0–53)
AST: 16 U/L (ref 0–37)
Albumin: 4 g/dL (ref 3.5–5.2)
Alkaline Phosphatase: 71 U/L (ref 39–117)
BUN: 21 mg/dL (ref 6–23)
CO2: 28 mEq/L (ref 19–32)
Calcium: 9.2 mg/dL (ref 8.4–10.5)
Chloride: 102 mEq/L (ref 96–112)
Creatinine, Ser: 1.27 mg/dL (ref 0.40–1.50)
GFR: 54.33 mL/min — ABNORMAL LOW (ref >60.00)
Glucose, Bld: 83 mg/dL (ref 70–99)
Potassium: 4.3 mEq/L (ref 3.5–5.1)
Sodium: 138 mEq/L (ref 135–145)
Total Bilirubin: 0.9 mg/dL (ref 0.2–1.2)
Total Protein: 6.6 g/dL (ref 6.0–8.3)

## 2019-06-06 LAB — CBC WITH DIFFERENTIAL/PLATELET
Basophils Absolute: 0.1 10*3/uL (ref 0.0–0.1)
Basophils Relative: 1 % (ref 0.0–3.0)
Eosinophils Absolute: 0.2 10*3/uL (ref 0.0–0.7)
Eosinophils Relative: 3.4 % (ref 0.0–5.0)
HCT: 43.2 % (ref 39.0–52.0)
Hemoglobin: 14.3 g/dL (ref 13.0–17.0)
Lymphocytes Relative: 19.4 % (ref 12.0–46.0)
Lymphs Abs: 1.4 10*3/uL (ref 0.7–4.0)
MCHC: 33 g/dL (ref 30.0–36.0)
MCV: 90.4 fl (ref 78.0–100.0)
Monocytes Absolute: 1.1 10*3/uL — ABNORMAL HIGH (ref 0.1–1.0)
Monocytes Relative: 14.5 % — ABNORMAL HIGH (ref 3.0–12.0)
Neutro Abs: 4.5 10*3/uL (ref 1.4–7.7)
Neutrophils Relative %: 61.7 % (ref 43.0–77.0)
Platelets: 164 10*3/uL (ref 150.0–400.0)
RBC: 4.78 Mil/uL (ref 4.22–5.81)
RDW: 15 % (ref 11.5–15.5)
WBC: 7.3 10*3/uL (ref 4.0–10.5)

## 2019-06-12 ENCOUNTER — Ambulatory Visit: Payer: PPO | Admitting: Medical

## 2019-06-14 ENCOUNTER — Encounter: Payer: Self-pay | Admitting: Cardiology

## 2019-06-14 NOTE — Progress Notes (Signed)
Remote pacemaker transmission.   

## 2019-06-15 ENCOUNTER — Other Ambulatory Visit: Payer: Self-pay | Admitting: Family Medicine

## 2019-06-28 DIAGNOSIS — N402 Nodular prostate without lower urinary tract symptoms: Secondary | ICD-10-CM | POA: Diagnosis not present

## 2019-07-03 DIAGNOSIS — N402 Nodular prostate without lower urinary tract symptoms: Secondary | ICD-10-CM | POA: Diagnosis not present

## 2019-07-03 DIAGNOSIS — R972 Elevated prostate specific antigen [PSA]: Secondary | ICD-10-CM | POA: Diagnosis not present

## 2019-07-08 ENCOUNTER — Telehealth: Payer: Self-pay

## 2019-07-08 DIAGNOSIS — M549 Dorsalgia, unspecified: Secondary | ICD-10-CM

## 2019-07-08 NOTE — Telephone Encounter (Signed)
Copied from [place] [id number]. Topic: Referral - Request for Referral >> Jul 08, 2019 11:49 AM Yvette Rack wrote: Has patient seen PCP for this complaint? yes  *If NO, is insurance requiring patient see PCP for this issue before PCP can refer them? Referral for which specialty: Sports Medicine Preferred provider/office: [facility] Reason for referral: due to back pain

## 2019-07-09 ENCOUNTER — Other Ambulatory Visit: Payer: Self-pay

## 2019-07-09 ENCOUNTER — Ambulatory Visit: Payer: PPO | Admitting: Family Medicine

## 2019-07-09 ENCOUNTER — Encounter: Payer: Self-pay | Admitting: Family Medicine

## 2019-07-09 VITALS — BP 146/78 | HR 67 | Ht 72.0 in | Wt 215.0 lb

## 2019-07-09 DIAGNOSIS — M545 Low back pain, unspecified: Secondary | ICD-10-CM

## 2019-07-09 NOTE — Progress Notes (Signed)
Brian Davidson - 83 y.o. male MRN [id number]  Date of birth: Jan 31, 1936  SUBJECTIVE:  Including CC & ROS.  No chief complaint on file.   Brian Davidson is a 83 y.o. male that is presenting with acute right-sided low back pain.  This is acute on chronic in nature.  This been ongoing for a couple of weeks.  It is moderate to severe at times.  He has intermittent spasms which will lock him up.  Has mild sciatic type symptoms down the right lateral aspect of the thigh.  No improvement with measures to date.  Has had similar pain in the past.  This does not feel similar to his kidney stones.  No numbness or tingling.  Feels better when he lies down on his back.  Pain seemed to be worse with prolonged standing or back extension.  Independent review of the lumbar spine x-ray from 1/2 shows severe degenerative changes L5-S1.   Review of Systems  Constitutional: Negative for fever.  HENT: Negative for congestion.   Respiratory: Negative for cough.   Cardiovascular: Negative for chest pain.  Gastrointestinal: Negative for abdominal pain.  Musculoskeletal: Positive for back pain.  Skin: Negative for color change.  Neurological: Negative for weakness.  Hematological: Negative for adenopathy.    HISTORY: Past Medical, Surgical, Social, and Family History Reviewed & Updated per EMR.   Pertinent Historical Findings include:  Past Medical History:  Diagnosis Date  . Arthritis   . Biliary dyskinesia   . Carotid artery disease ([place]) 07/01/2016  . Dyslipidemia 07/13/2017  . GERD (gastroesophageal reflux disease)   . H/O measles   . History of chicken pox   . Hypertension   . Kidney stone 03/2015  . Persistent atrial fibrillation ([place])   . Pneumonia 1992  . Tachycardia-bradycardia ([place])    a. s/p STJ dual chamber pacemaker  . Thiamine deficiency 01/19/2018  . Valvular heart disease 07/01/2016    Past Surgical History:  Procedure Laterality Date  . CHOLECYSTECTOMY N/A 11/24/2015   Procedure: LAPAROSCOPIC CHOLECYSTECTOMY;  Surgeon: Greer Pickerel, MD;  Location: [facility];  Service: General;  Laterality: N/A;  . CYSTOSCOPY  2016  . FRACTURE SURGERY  2005   left ankle  . HERNIA REPAIR     left groin, no mesh  . PERMANENT PACEMAKER INSERTION N/A 01/31/2012   SJM Accent SR RF implanted by DR Allred    Allergies  Allergen Reactions  . Cardizem [Diltiazem]     Rash from Yellow dye in generic capsule. Can take different brand. Takes diltiazem - his allergy is to Cardizem  . Diltiazem Hcl     Rash from red dye in generic capsule. Can take different brand. Takes diltiazem - his allergy is to Cardizem Rash from red dye in generic capsule. Can take different brand. Takes diltiazem - his allergy is to Cardizem  . Sulfa Antibiotics     Unknown childhood reaction    Family History  Problem Relation Age of Onset  . Hypertension Mother   . Arthritis Mother   . Drug abuse Mother   . Stroke Father   . Heart disease Father   . Heart disease Sister 71  . Heart disease Brother   . Hypertension Son   . Heart disease Sister   . Neuropathy Brother   . Hypertension Brother   . Atrial fibrillation Brother   . Atrial fibrillation Sister   . Colon cancer Neg Hx      Social History   Socioeconomic History  .  Marital status: Married    Spouse name: [name]  . Number of children: 1  . Years of education: Not on file  . Highest education level: Not on file  Occupational History  . Occupation: Retired  . Occupation: Dance movement psychotherapist for [employer]  . Financial resource strain: Not on file  . Food insecurity    Worry: Not on file    Inability: Not on file  . Transportation needs    Medical: Not on file    Non-medical: Not on file  Tobacco Use  . Smoking status: Former Smoker    Years: 10.00    Types: Pipe, Cigars    Quit date: 11/18/1985    Years since quitting: 33.6  . Smokeless tobacco: Never Used  Substance and Sexual Activity  . Alcohol use:  Yes    Alcohol/week: 3.0 standard drinks    Types: 3 Cans of beer per week    Comment: occ  . Drug use: No  . Sexual activity: Never  Lifestyle  . Physical activity    Days per week: Not on file    Minutes per session: Not on file  . Stress: Not on file  Relationships  . Social Herbalist on phone: Not on file    Gets together: Not on file    Attends religious service: Not on file    Active member of club or organization: Not on file    Attends meetings of clubs or organizations: Not on file    Relationship status: Not on file  . Intimate partner violence    Fear of current or ex partner: Not on file    Emotionally abused: Not on file    Physically abused: Not on file    Forced sexual activity: Not on file  Other Topics Concern  . Not on file  Social History Narrative   Lives with wife, still works part-time as a Geophysicist/field seismologist. He is active around the house and yard...   Parents are deceased and did not have cardiac issues but he has 2 siblings with atrial fibrillation and a sister-in-law  has a pacemaker.     PHYSICAL EXAM:  VS: BP (!) 146/78   Pulse 67   Ht 6' (1.829 m)   Wt 215 lb (97.5 kg)   BMI 29.16 kg/m  Physical Exam Gen: NAD, alert, cooperative with exam, well-appearing ENT: normal lips, normal nasal mucosa,  Eye: normal EOM, normal conjunctiva and lids CV:  no edema, +2 pedal pulses   Resp: no accessory muscle use, non-labored,   Skin: no rashes, no areas of induration  Neuro: normal tone, normal sensation to touch Psych:  normal insight, alert and oriented MSK:  Back: Normal flexion. Pain with extension. Tenderness to palpation over the right paraspinal lumbar muscles. No tenderness to palpation of the midline lumbar spine. Normal internal and external rotation of the hips. Normal strength resistance with hip flexion, knee flexion and extension, plantarflexion and dorsiflexion. Negative straight leg raise bilaterally. Neurovascularly intact      ASSESSMENT & PLAN:   Low back pain Spasms are the main source of his pain today.  Likely has degenerative changes are contributing as well.  Less likely for nerve impingement.   -Counseled on home exercise therapy and supportive care. -Referral to physical therapy. -If no improvement can consider medications

## 2019-07-09 NOTE — Assessment & Plan Note (Signed)
Spasms are the main source of his pain today.  Likely has degenerative changes are contributing as well.  Less likely for nerve impingement.   -Counseled on home exercise therapy and supportive care. -Referral to physical therapy. -If no improvement can consider medications

## 2019-07-09 NOTE — Patient Instructions (Signed)
Nice to meet you Please try heat  Please try Aspercreme with lidocaine or Salonpas Please try the exercises  You will get a call about physical therapy  Please send me a message in [place] with any questions or updates.  Please see me back in 4 weeks.   --Dr. Raeford Razor

## 2019-07-11 ENCOUNTER — Other Ambulatory Visit: Payer: Self-pay

## 2019-07-11 ENCOUNTER — Ambulatory Visit: Payer: PPO | Attending: Family Medicine | Admitting: Physical Therapy

## 2019-07-11 ENCOUNTER — Encounter: Payer: Self-pay | Admitting: Physical Therapy

## 2019-07-11 DIAGNOSIS — R262 Difficulty in walking, not elsewhere classified: Secondary | ICD-10-CM | POA: Diagnosis not present

## 2019-07-11 DIAGNOSIS — R29898 Other symptoms and signs involving the musculoskeletal system: Secondary | ICD-10-CM | POA: Diagnosis not present

## 2019-07-11 DIAGNOSIS — M545 Low back pain, unspecified: Secondary | ICD-10-CM

## 2019-07-11 NOTE — Therapy (Signed)
[facility] [address]  [place], [place], [address] Phone: [phone]   Fax:  [phone]  Physical Therapy Evaluation  Patient Details  Name: Brian Davidson MRN: [id number] Date of Birth: 09/02/1936 Referring Provider (PT): Clearance Coots, MD   Encounter Date: 07/11/2019  PT End of Session - 07/11/19 1056    Visit Number  1    Number of Visits  9    Date for PT Re-Evaluation  08/08/19    Authorization Type  [employer]    PT Start Time  0929    PT Stop Time  1011    PT Time Calculation (min)  42 min    Activity Tolerance  Patient tolerated treatment well;Patient limited by pain    Behavior During Therapy  [facility] for tasks assessed/performed       Past Medical History:  Diagnosis Date  . Arthritis   . Biliary dyskinesia   . Carotid artery disease ([place]) 07/01/2016  . Dyslipidemia 07/13/2017  . GERD (gastroesophageal reflux disease)   . H/O measles   . History of chicken pox   . Hypertension   . Kidney stone 03/2015  . Persistent atrial fibrillation ([place])   . Pneumonia 1992  . Tachycardia-bradycardia ([place])    a. s/p STJ dual chamber pacemaker  . Thiamine deficiency 01/19/2018  . Valvular heart disease 07/01/2016    Past Surgical History:  Procedure Laterality Date  . CHOLECYSTECTOMY N/A 11/24/2015   Procedure: LAPAROSCOPIC CHOLECYSTECTOMY;  Surgeon: Greer Pickerel, MD;  Location: [facility];  Service: General;  Laterality: N/A;  . CYSTOSCOPY  2016  . FRACTURE SURGERY  2005   left ankle  . HERNIA REPAIR     left groin, no mesh  . PERMANENT PACEMAKER INSERTION N/A 01/31/2012   SJM Accent SR RF implanted by DR Allred    There were no vitals filed for this visit.   Subjective Assessment - 07/11/19 1017    Subjective  Patient reports LBP for the past couple weeks. Denies injury or change in activity as causative factors. Pain is located over R LB with intermittent radiation of pain down R posterior thigh,  stopping at knee. Denies N/T. Worse with walking and twisting, laying down in bed. Better with pain relief cream. Walks his dog every day for a mile and uses a walking stick for safety.    Pertinent History  pacemaker, tachycardia/bradycardia, a-fib, kidney stone, HTN, GERD, HLD, CAD, hernie repair, L ankle fx surgery    Limitations  House hold activities;Walking;Standing;Lifting    How long can you sit comfortably?  unlimited    How long can you stand comfortably?  unlimited    How long can you walk comfortably?  1 mile- 40 min    Diagnostic tests  09/06/18 lumbar xray: Osteoarthritic change, most severe at L5-S1, similar to recent study. No fracture or spondylolisthesis. There is aortoiliac atherosclerosis.    Patient Stated Goals  get away from the pain    Currently in Pain?  Yes    Pain Score  2     Pain Location  Back    Pain Orientation  Right;Lower    Pain Descriptors / Indicators  Sharp    Pain Type  Acute pain         [facility] PT Assessment - 07/11/19 1030      Assessment   Medical Diagnosis  Acute R sided LBP without sciatica    Referring Provider (PT)  Clearance Coots, MD  Onset Date/Surgical Date  06/27/19    Next MD Visit  yes- vertigo    Prior Therapy  08/06/19      Precautions   Precautions  ICD/Pacemaker      Restrictions   Weight Bearing Restrictions  No      Balance Screen   Has the patient fallen in the past 6 months  No    Has the patient had a decrease in activity level because of a fear of falling?   No    Is the patient reluctant to leave their home because of a fear of falling?   No      Home Film/video editor residence    Living Arrangements  Spouse/significant other    Available Help at Discharge  Family    Type of [place] entrance    [place]  One level      Prior Function   Level of [place]  Retired    Leisure  walking Community education officer   Overall Cognitive  Status  Within Functional Limits for tasks assessed      Observation/Other Assessments   Focus on Therapeutic Outcomes (FOTO)   Lumbar: 77 (23% limited, 16% predicted)      Sensation   Light Touch  Appears Intact   neuropathy in B feet     Coordination   Gross Motor Movements are Fluid and Coordinated  Yes      Posture/Postural Control   Posture/Postural Control  Postural limitations    Postural Limitations  Rounded Shoulders;Weight shift left      ROM / Strength   AROM / PROM / Strength  AROM;Strength      AROM   AROM Assessment Site  Lumbar    Lumbar Flexion  toes    Lumbar Extension  Severely limited   4/10 pain in R LB   Lumbar - Right Side Bend  distal thigh   7/10 pain in R LB   Lumbar - Left Side Bend  distal thigh    Lumbar - Right Rotation  mildly limited    Lumbar - Left Rotation  mildly limited      Strength   Strength Assessment Site  Hip;Knee;Ankle    Right/Left Hip  Right;Left    Right Hip Flexion  4/5   pain in knee   Right Hip ABduction  4/5    Right Hip ADduction  4/5    Left Hip Flexion  4/5    Left Hip ABduction  4/5    Left Hip ADduction  4/5    Right/Left Knee  Right;Left    Right Knee Flexion  4+/5    Right Knee Extension  4+/5    Left Knee Flexion  5/5    Left Knee Extension  5/5    Right/Left Ankle  Right;Left    Right Ankle Dorsiflexion  4+/5    Right Ankle Plantar Flexion  4+/5    Left Ankle Dorsiflexion  4+/5    Left Ankle Plantar Flexion  4+/5      Flexibility   Soft Tissue Assessment /Muscle Length  yes    Hamstrings  mildly tight B LEs    Piriformis  mildly tight B LEs      Palpation   Palpation comment  TTP in R lumbar paraspinals at level of ~L3      Ambulation/Gait  Assistive device  None    Gait Pattern  Step-through pattern;Decreased stance time - right;Decreased step length - left;Decreased weight shift to right    Ambulation Surface  Level;Indoor    Gait velocity  mildly decreased                 Objective measurements completed on examination: See above findings.              PT Education - 07/11/19 1056    Education Details  prognosis, POC, HEP    Person(s) Educated  Patient    Methods  Explanation;Demonstration;Tactile cues;Verbal cues;Handout    Comprehension  Verbalized understanding;Returned demonstration       PT Short Term Goals - 07/11/19 1201      PT SHORT TERM GOAL #1   Title  Patient to be independent with initial HEP.    Time  2    Period  Weeks    Status  New    Target Date  07/25/19        PT Long Term Goals - 07/11/19 1201      PT LONG TERM GOAL #1   Title  Patient to be independent with advanced HEP.    Time  4    Period  Weeks    Status  New    Target Date  08/08/19      PT LONG TERM GOAL #2   Title  Patient to demonstrate lumbar AROM WFL and nonpainful.    Time  4    Period  Weeks    Status  New    Target Date  08/08/19      PT LONG TERM GOAL #3   Title  Patient to demonstrate B hip strength >/=4+/5.    Time  4    Period  Weeks    Status  New    Target Date  08/08/19      PT LONG TERM GOAL #4   Title  Patient to report tolerance of walking 1 mild/day without pain limiting.    Time  4    Period  Weeks    Status  New    Target Date  08/08/19      PT LONG TERM GOAL #5   Title  Patient to report 0/10 pain with bed mobility.    Time  4    Period  Weeks    Status  New    Target Date  08/08/19             Plan - 07/11/19 1057    Clinical Impression Statement  Patient is an 83y/o M presenting to OPPT with c/o acute R sided LBP of a couple weeks duration without inciting event. Pain is located over R LB with intermittent radiation of pain down R posterior thigh, stopping at knee. Denies N/T. Worse with walking and twisting, laying down in bed. Patient today demonstrated slightly limited and painful lumbar AROM, decreased hip strength, mild tightness in B LEs, tenderness to palpation over R lumbar  paraspinals, and gait deviations. Educated patient on gentle stretching HEP and advised to avoid pushing into pain. Patient reported understanding. Would benefit form skilled PT services 2x/week for 4 weeks to address aforementioned impairments.    Personal Factors and Comorbidities  Comorbidity 3+;Fitness;Past/Current Experience;Time since onset of injury/illness/exacerbation;Age    Comorbidities  pacemaker, tachycardia/bradycardia, a-fib, kidney stone, HTN, GERD, HLD, CAD, hernie repair, L ankle fx surgery    Examination-Activity Limitations  Bed Mobility;Sleep;Bend;Stairs;Carry;Transfers;Lift;Locomotion Level;Reach Overhead  Examination-Participation Restrictions  Cleaning;Shop;Community Activity;Yard Work;Interpersonal Relationship;Laundry    Stability/Clinical Decision Making  Stable/Uncomplicated    Clinical Decision Making  Low    Rehab Potential  Good    PT Frequency  2x / week    PT Duration  4 weeks    PT Treatment/Interventions  ADLs/Self Care Home Management;Cryotherapy;Moist Heat;Traction;Balance training;Therapeutic exercise;Therapeutic activities;Functional mobility training;Stair training;Gait training;Neuromuscular re-education;Patient/family education;Manual techniques;Taping;Energy conservation;Dry needling;Passive range of motion    PT Next Visit Plan  reassess HEP    Consulted and Agree with Plan of Care  Patient       Patient will benefit from skilled therapeutic intervention in order to improve the following deficits and impairments:  Decreased activity tolerance, Decreased strength, Pain, Increased muscle spasms, Difficulty walking, Decreased balance, Decreased range of motion, Improper body mechanics, Postural dysfunction, Impaired flexibility  Visit Diagnosis: Acute right-sided low back pain without sciatica  Difficulty in walking, not elsewhere classified  Other symptoms and signs involving the musculoskeletal system     Problem List Patient Active Problem  List   Diagnosis Date Noted  . Symptomatic bradycardia 05/31/2019  . Vertigo 07/27/2018  . Multiple closed fractures of ribs of right side 01/21/2018  . Thiamine deficiency 01/19/2018  . Peripheral neuropathy 07/13/2017  . Dyslipidemia 07/13/2017  . Preventative health care 01/11/2017  . Carotid artery disease ([place]) 07/01/2016  . Mitral valve disorder 07/01/2016  . Low back pain 12/31/2015  . Osteopenia 12/31/2015  . Allergic state 12/31/2015  . Skin lesion 12/31/2015  . History of chicken pox   . Calculi, ureter 03/30/2015  . Arthritis of knee, degenerative 05/10/2014  . Pacemaker-St.Jude 02/01/2012  . Permanent atrial fibrillation ([place])   . Hypertension   . Heart murmur   . Chronic anticoagulation      Janene Harvey, PT, DPT 07/11/19 12:05 PM   [facility] [address]  [place], [place], [address] Phone: [phone]   Fax:  [phone]  Name: Brian Davidson MRN: [id number] Date of Birth: 10-29-1935

## 2019-07-15 ENCOUNTER — Other Ambulatory Visit: Payer: Self-pay

## 2019-07-15 ENCOUNTER — Ambulatory Visit: Payer: PPO

## 2019-07-15 DIAGNOSIS — R29898 Other symptoms and signs involving the musculoskeletal system: Secondary | ICD-10-CM

## 2019-07-15 DIAGNOSIS — M545 Low back pain, unspecified: Secondary | ICD-10-CM

## 2019-07-15 DIAGNOSIS — R262 Difficulty in walking, not elsewhere classified: Secondary | ICD-10-CM

## 2019-07-15 NOTE — Therapy (Signed)
[facility] [address]  [place], [place], [address] Phone: [phone]   Fax:  [phone]  Physical Therapy Treatment  Patient Details  Name: Brian Davidson MRN: [id number] Date of Birth: 09-10-1935 Referring Provider (PT): Clearance Coots, MD   Encounter Date: 07/15/2019  PT End of Session - 07/15/19 1023    Visit Number  2    Number of Visits  9    Date for PT Re-Evaluation  08/08/19    Authorization Type  [employer]    PT Start Time  1016    PT Stop Time  1110   Ended visit with 10 min moist heat to lumbar spine   PT Time Calculation (min)  54 min    Activity Tolerance  Patient tolerated treatment well;Patient limited by pain    Behavior During Therapy  [facility] for tasks assessed/performed       Past Medical History:  Diagnosis Date  . Arthritis   . Biliary dyskinesia   . Carotid artery disease ([place]) 07/01/2016  . Dyslipidemia 07/13/2017  . GERD (gastroesophageal reflux disease)   . H/O measles   . History of chicken pox   . Hypertension   . Kidney stone 03/2015  . Persistent atrial fibrillation ([place])   . Pneumonia 1992  . Tachycardia-bradycardia ([place])    a. s/p STJ dual chamber pacemaker  . Thiamine deficiency 01/19/2018  . Valvular heart disease 07/01/2016    Past Surgical History:  Procedure Laterality Date  . CHOLECYSTECTOMY N/A 11/24/2015   Procedure: LAPAROSCOPIC CHOLECYSTECTOMY;  Surgeon: Greer Pickerel, MD;  Location: [facility];  Service: General;  Laterality: N/A;  . CYSTOSCOPY  2016  . FRACTURE SURGERY  2005   left ankle  . HERNIA REPAIR     left groin, no mesh  . PERMANENT PACEMAKER INSERTION N/A 01/31/2012   SJM Accent SR RF implanted by DR Allred    There were no vitals filed for this visit.  Subjective Assessment - 07/15/19 1022    Subjective  Pt. noting, "it catches me when I move wrong".    Pertinent History  pacemaker, tachycardia/bradycardia, a-fib, kidney stone, HTN, GERD, HLD,  CAD, hernie repair, L ankle fx surgery    Diagnostic tests  09/06/18 lumbar xray: Osteoarthritic change, most severe at L5-S1, similar to recent study. No fracture or spondylolisthesis. There is aortoiliac atherosclerosis.    Patient Stated Goals  get away from the pain    Currently in Pain?  Yes    Pain Score  4     Pain Location  Back    Pain Orientation  Right;Lower                       [facility] Adult PT Treatment/Exercise - 07/15/19 0001      Lumbar Exercises: Stretches   Single Knee to Chest Stretch  Right;Left;30 seconds;2 reps    Single Knee to Chest Stretch Limitations  opposite LE bent     Lower Trunk Rotation Limitations  5" x 10 reps    Lumbar Stabilization Level 1  30 seconds;2 reps    Lumbar Stabilization Level 1 Limitations  green p-ball seated roll outs to L     Piriformis Stretch  Right;Left;1 rep;30 seconds    Piriformis Stretch Limitations  towel assistance - KTOS    Figure 4 Stretch  1 rep;30 seconds    Figure 4 Stretch Limitations  B seated       Lumbar  Exercises: Aerobic   Nustep  Lvl 3, 6 min (LE only)      Lumbar Exercises: Sidelying   Other Sidelying Lumbar Exercises  B sidelying "open book" stretch 5" x 10 rpes    cues for bent UE and trunk rotation to achieve stretch      Knee/Hip Exercises: Standing   Hip Abduction  Right;Left;10 reps;Knee straight;Stengthening    Abduction Limitations  counter    Hip Extension  Right;Left;10 reps;Knee straight;Stengthening    Extension Limitations  counter      Modalities   Modalities  Moist Heat      Moist Heat Therapy   Number Minutes Moist Heat  10 Minutes    Moist Heat Location  Lumbar Spine      Manual Therapy   Manual Therapy  Soft tissue mobilization;Myofascial release    Manual therapy comments  sidelying     Soft tissue mobilization  R lumbar paraspinals     Myofascial Release  TPR to R lumbar paraspinals               PT Short Term Goals - 07/15/19 1029      PT SHORT TERM  GOAL #1   Title  Patient to be independent with initial HEP.    Time  2    Period  Weeks    Status  On-going    Target Date  07/25/19        PT Long Term Goals - 07/15/19 1029      PT LONG TERM GOAL #1   Title  Patient to be independent with advanced HEP.    Time  4    Period  Weeks    Status  On-going      PT LONG TERM GOAL #2   Title  Patient to demonstrate lumbar AROM WFL and nonpainful.    Time  4    Period  Weeks    Status  On-going      PT LONG TERM GOAL #3   Title  Patient to demonstrate B hip strength >/=4+/5.    Time  4    Period  Weeks    Status  On-going      PT LONG TERM GOAL #4   Title  Patient to report tolerance of walking 1 mild/day without pain limiting.    Time  4    Period  Weeks    Status  On-going      PT LONG TERM GOAL #5   Title  Patient to report 0/10 pain with bed mobility.    Time  4    Period  Weeks    Status  On-going            Plan - 07/15/19 1031    Clinical Impression Statement  Yvone Neu reporting he is having most R-sided LBP after walking.  Reviewed pt. initial HEP and required cueing for proper trunk rotation with LTR and "open book" stretch.  Tolerated all gentle lumbopelvic ROM and strengthening activities well today with occasional short-lasting increases of LBP which seemed positioning in nature.  Ended visit with moist heat to lumbar spine in seated position as pt. notes benefit from this at home.    Comorbidities  pacemaker, tachycardia/bradycardia, a-fib, kidney stone, HTN, GERD, HLD, CAD, hernie repair, L ankle fx surgery    Rehab Potential  Good    PT Treatment/Interventions  ADLs/Self Care Home Management;Cryotherapy;Moist Heat;Traction;Balance training;Therapeutic exercise;Therapeutic activities;Functional mobility training;Stair training;Gait training;Neuromuscular re-education;Patient/family education;Manual techniques;Taping;Energy conservation;Dry needling;Passive range of  motion    Consulted and Agree with Plan of  Care  Patient       Patient will benefit from skilled therapeutic intervention in order to improve the following deficits and impairments:  Decreased activity tolerance, Decreased strength, Pain, Increased muscle spasms, Difficulty walking, Decreased balance, Decreased range of motion, Improper body mechanics, Postural dysfunction, Impaired flexibility  Visit Diagnosis: Acute right-sided low back pain without sciatica  Difficulty in walking, not elsewhere classified  Other symptoms and signs involving the musculoskeletal system     Problem List Patient Active Problem List   Diagnosis Date Noted  . Symptomatic bradycardia 05/31/2019  . Vertigo 07/27/2018  . Multiple closed fractures of ribs of right side 01/21/2018  . Thiamine deficiency 01/19/2018  . Peripheral neuropathy 07/13/2017  . Dyslipidemia 07/13/2017  . Preventative health care 01/11/2017  . Carotid artery disease ([place]) 07/01/2016  . Mitral valve disorder 07/01/2016  . Low back pain 12/31/2015  . Osteopenia 12/31/2015  . Allergic state 12/31/2015  . Skin lesion 12/31/2015  . History of chicken pox   . Calculi, ureter 03/30/2015  . Arthritis of knee, degenerative 05/10/2014  . Pacemaker-St.Jude 02/01/2012  . Permanent atrial fibrillation ([place])   . Hypertension   . Heart murmur   . Chronic anticoagulation     Bess Harvest, PTA 07/15/19 12:26 PM   [facility] [address]  [place], [place], [address] Phone: [phone]   Fax:  [phone]  Name: MACIE SCHAER MRN: [id number] Date of Birth: 11-Jun-1936

## 2019-07-16 DIAGNOSIS — M1711 Unilateral primary osteoarthritis, right knee: Secondary | ICD-10-CM | POA: Diagnosis not present

## 2019-07-16 DIAGNOSIS — M25561 Pain in right knee: Secondary | ICD-10-CM | POA: Diagnosis not present

## 2019-07-16 DIAGNOSIS — M170 Bilateral primary osteoarthritis of knee: Secondary | ICD-10-CM | POA: Diagnosis not present

## 2019-07-18 ENCOUNTER — Telehealth: Payer: Self-pay

## 2019-07-18 ENCOUNTER — Ambulatory Visit: Payer: PPO

## 2019-07-18 ENCOUNTER — Other Ambulatory Visit: Payer: Self-pay

## 2019-07-18 ENCOUNTER — Telehealth: Payer: Self-pay | Admitting: Family Medicine

## 2019-07-18 DIAGNOSIS — M545 Low back pain, unspecified: Secondary | ICD-10-CM

## 2019-07-18 DIAGNOSIS — R29898 Other symptoms and signs involving the musculoskeletal system: Secondary | ICD-10-CM

## 2019-07-18 DIAGNOSIS — R262 Difficulty in walking, not elsewhere classified: Secondary | ICD-10-CM

## 2019-07-18 NOTE — Telephone Encounter (Signed)
Patient was hoping to have some samples of Xarleto. Please advise.

## 2019-07-18 NOTE — Telephone Encounter (Signed)
Per Dr. Charlett Blake patient can have 4 bottles of Xarelto 20mg  due to the "donut hole".

## 2019-07-18 NOTE — Therapy (Signed)
[facility] [address]  [place], [place], [address] Phone: [phone]   Fax:  [phone]  Physical Therapy Treatment  Patient Details  Name: [place] MRN: [id number] Date of Birth: 1936/06/22 Referring Provider (PT): Clearance Coots, MD   Encounter Date: 07/18/2019  PT End of Session - 07/18/19 1030    Visit Number  3    Number of Visits  9    Date for PT Re-Evaluation  08/08/19    Authorization Type  [employer]    PT Start Time  1017    PT Stop Time  1105   Ended visit with 10 moist heat   PT Time Calculation (min)  48 min    Activity Tolerance  Patient tolerated treatment well;Patient limited by pain    Behavior During Therapy  [facility] for tasks assessed/performed       Past Medical History:  Diagnosis Date  . Arthritis   . Biliary dyskinesia   . Carotid artery disease ([place]) 07/01/2016  . Dyslipidemia 07/13/2017  . GERD (gastroesophageal reflux disease)   . H/O measles   . History of chicken pox   . Hypertension   . Kidney stone 03/2015  . Persistent atrial fibrillation ([place])   . Pneumonia 1992  . Tachycardia-bradycardia ([place])    a. s/p STJ dual chamber pacemaker  . Thiamine deficiency 01/19/2018  . Valvular heart disease 07/01/2016    Past Surgical History:  Procedure Laterality Date  . CHOLECYSTECTOMY N/A 11/24/2015   Procedure: LAPAROSCOPIC CHOLECYSTECTOMY;  Surgeon: Greer Pickerel, MD;  Location: [facility];  Service: General;  Laterality: N/A;  . CYSTOSCOPY  2016  . FRACTURE SURGERY  2005   left ankle  . HERNIA REPAIR     left groin, no mesh  . PERMANENT PACEMAKER INSERTION N/A 01/31/2012   SJM Accent SR RF implanted by DR Allred    There were no vitals filed for this visit.  Subjective Assessment - 07/18/19 1028    Subjective  Pt. noting he got a cortisone shot in his R knee yesterday.    Pertinent History  pacemaker, tachycardia/bradycardia, a-fib, kidney stone, HTN, GERD, HLD, CAD,  hernie repair, L ankle fx surgery    Diagnostic tests  09/06/18 lumbar xray: Osteoarthritic change, most severe at L5-S1, similar to recent study. No fracture or spondylolisthesis. There is aortoiliac atherosclerosis.    Patient Stated Goals  get away from the pain    Currently in Pain?  Yes    Pain Score  5     Pain Location  Back    Pain Orientation  Right;Lower;Lateral    Pain Descriptors / Indicators  Dull    Pain Type  Acute pain    Pain Onset  More than a month ago    Pain Frequency  Intermittent    Aggravating Factors   unsure    Multiple Pain Sites  No                       [facility] Adult PT Treatment/Exercise - 07/18/19 0001      Lumbar Exercises: Stretches   Passive Hamstring Stretch  Left;Right;1 rep;30 seconds    Passive Hamstring Stretch Limitations  supine + strap     Single Knee to Chest Stretch  Right;Left;2 reps;30 seconds    Single Knee to Chest Stretch Limitations  opposite LE bent     Lower Trunk Rotation Limitations  5" x 10 reps  Piriformis Stretch  Right;Left;1 rep;30 seconds    Piriformis Stretch Limitations  KTOS      Lumbar Exercises: Aerobic   Recumbent Bike  Lvl 1, 7 min       Lumbar Exercises: Supine   Bridge  10 reps;1 second    Bridge Limitations  pain on first repetition       Knee/Hip Exercises: Standing   Hip Flexion  Right;Left;10 reps;Knee straight    Hip Flexion Limitations  at counter     Hip Abduction  Right;Left;10 reps;Knee straight;Stengthening    Abduction Limitations  counter    Hip Extension  Right;Left;10 reps;Knee straight;Stengthening    Extension Limitations  counter    Functional Squat  10 reps;3 seconds    Functional Squat Limitations  counter       Modalities   Modalities  Moist Heat      Moist Heat Therapy   Number Minutes Moist Heat  10 Minutes    Moist Heat Location  Lumbar Spine               PT Short Term Goals - 07/15/19 1029      PT SHORT TERM GOAL #1   Title  Patient to be  independent with initial HEP.    Time  2    Period  Weeks    Status  On-going    Target Date  07/25/19        PT Long Term Goals - 07/15/19 1029      PT LONG TERM GOAL #1   Title  Patient to be independent with advanced HEP.    Time  4    Period  Weeks    Status  On-going      PT LONG TERM GOAL #2   Title  Patient to demonstrate lumbar AROM WFL and nonpainful.    Time  4    Period  Weeks    Status  On-going      PT LONG TERM GOAL #3   Title  Patient to demonstrate B hip strength >/=4+/5.    Time  4    Period  Weeks    Status  On-going      PT LONG TERM GOAL #4   Title  Patient to report tolerance of walking 1 mild/day without pain limiting.    Time  4    Period  Weeks    Status  On-going      PT LONG TERM GOAL #5   Title  Patient to report 0/10 pain with bed mobility.    Time  4    Period  Weeks    Status  On-going            Plan - 07/18/19 1034    Clinical Impression Statement  Yvone Neu reporting LTR and KTOS stretch has helped him most since starting therapy.  Tolerated mild progression of standing proximal hip strengthening and continued LE stretching without increased pain today.  Feels PT is helping his pain reduce overall and progress well toward LTGs.    Comorbidities  pacemaker, tachycardia/bradycardia, a-fib, kidney stone, HTN, GERD, HLD, CAD, hernie repair, L ankle fx surgery    Rehab Potential  Good    PT Treatment/Interventions  ADLs/Self Care Home Management;Cryotherapy;Moist Heat;Traction;Balance training;Therapeutic exercise;Therapeutic activities;Functional mobility training;Stair training;Gait training;Neuromuscular re-education;Patient/family education;Manual techniques;Taping;Energy conservation;Dry needling;Passive range of motion    Consulted and Agree with Plan of Care  Patient       Patient will benefit from skilled therapeutic intervention in  order to improve the following deficits and impairments:  Decreased activity tolerance, Decreased  strength, Pain, Increased muscle spasms, Difficulty walking, Decreased balance, Decreased range of motion, Improper body mechanics, Postural dysfunction, Impaired flexibility  Visit Diagnosis: Acute right-sided low back pain without sciatica  Difficulty in walking, not elsewhere classified  Other symptoms and signs involving the musculoskeletal system     Problem List Patient Active Problem List   Diagnosis Date Noted  . Symptomatic bradycardia 05/31/2019  . Vertigo 07/27/2018  . Multiple closed fractures of ribs of right side 01/21/2018  . Thiamine deficiency 01/19/2018  . Peripheral neuropathy 07/13/2017  . Dyslipidemia 07/13/2017  . Preventative health care 01/11/2017  . Carotid artery disease ([place]) 07/01/2016  . Mitral valve disorder 07/01/2016  . Low back pain 12/31/2015  . Osteopenia 12/31/2015  . Allergic state 12/31/2015  . Skin lesion 12/31/2015  . History of chicken pox   . Calculi, ureter 03/30/2015  . Arthritis of knee, degenerative 05/10/2014  . Pacemaker-St.Jude 02/01/2012  . Permanent atrial fibrillation ([place])   . Hypertension   . Heart murmur   . Chronic anticoagulation     Bess Harvest, PTA 07/18/19 12:20 PM     [facility] [address]  [place], [place], [address] Phone: [phone]   Fax:  [phone]  Name: RAWN LEVERETTE MRN: [id number] Date of Birth: May 26, 1936

## 2019-07-21 ENCOUNTER — Other Ambulatory Visit: Payer: Self-pay | Admitting: Family Medicine

## 2019-07-22 ENCOUNTER — Encounter: Payer: PPO | Admitting: Physical Therapy

## 2019-07-22 NOTE — Telephone Encounter (Signed)
Patient given samples of xarelto

## 2019-07-25 ENCOUNTER — Other Ambulatory Visit: Payer: Self-pay

## 2019-07-25 ENCOUNTER — Ambulatory Visit: Payer: PPO

## 2019-07-25 DIAGNOSIS — R262 Difficulty in walking, not elsewhere classified: Secondary | ICD-10-CM

## 2019-07-25 DIAGNOSIS — M545 Low back pain, unspecified: Secondary | ICD-10-CM

## 2019-07-25 DIAGNOSIS — R29898 Other symptoms and signs involving the musculoskeletal system: Secondary | ICD-10-CM

## 2019-07-25 NOTE — Therapy (Addendum)
[facility] [address]  [place], [place], [address] Phone: [phone]   Fax:  [phone]  Physical Therapy Treatment  Patient Details  Name: Brian [place] MRN: [id number] Date of Birth: 07/14/1936 Referring Provider (PT): Clearance Coots, MD   Encounter Date: 07/25/2019  PT End of Session - 07/25/19 1028    Visit Number  4    Number of Visits  9    Date for PT Re-Evaluation  08/08/19    Authorization Type  [employer]    PT Start Time  1019    PT Stop Time  1110   Ended visit with 10 min moist heat   PT Time Calculation (min)  51 min    Activity Tolerance  Patient tolerated treatment well;Patient limited by pain    Behavior During Therapy  [facility] for tasks assessed/performed       Past Medical History:  Diagnosis Date  . Arthritis   . Biliary dyskinesia   . Carotid artery disease ([place]) 07/01/2016  . Dyslipidemia 07/13/2017  . GERD (gastroesophageal reflux disease)   . H/O measles   . History of chicken pox   . Hypertension   . Kidney stone 03/2015  . Persistent atrial fibrillation ([place])   . Pneumonia 1992  . Tachycardia-bradycardia ([place])    a. s/p STJ dual chamber pacemaker  . Thiamine deficiency 01/19/2018  . Valvular heart disease 07/01/2016    Past Surgical History:  Procedure Laterality Date  . CHOLECYSTECTOMY N/A 11/24/2015   Procedure: LAPAROSCOPIC CHOLECYSTECTOMY;  Surgeon: Greer Pickerel, MD;  Location: [facility];  Service: General;  Laterality: N/A;  . CYSTOSCOPY  2016  . FRACTURE SURGERY  2005   left ankle  . HERNIA REPAIR     left groin, no mesh  . PERMANENT PACEMAKER INSERTION N/A 01/31/2012   SJM Accent SR RF implanted by DR Allred    There were no vitals filed for this visit.  Subjective Assessment - 07/25/19 1024    Subjective  Pt. reporting some R LBP which is intermittent where he, "is not feeling the pulling from therapy".  Does note improvement in pain with turning over in bed.     Pertinent History  pacemaker, tachycardia/bradycardia, a-fib, kidney stone, HTN, GERD, HLD, CAD, hernie repair, L ankle fx surgery    Diagnostic tests  09/06/18 lumbar xray: Osteoarthritic change, most severe at L5-S1, similar to recent study. No fracture or spondylolisthesis. There is aortoiliac atherosclerosis.    Patient Stated Goals  get away from the pain    Currently in Pain?  No/denies    Pain Score  0-No pain   up to 5-6/10 at worst   Pain Location  Back    Pain Orientation  Right;Lower;Lateral    Pain Descriptors / Indicators  Radiating   radiating from R lateral lower back around to R lateral adbomen at times   Pain Type  Acute pain    Pain Onset  More than a month ago    Pain Frequency  Intermittent    Multiple Pain Sites  No                       [facility] Adult PT Treatment/Exercise - 07/25/19 0001      Lumbar Exercises: Stretches   Single Knee to Chest Stretch  Right;Left;2 reps;30 seconds    Single Knee to Chest Stretch Limitations  opposite LE bent     Lower Trunk Rotation Limitations  5" x 10 reps    Piriformis Stretch  Right;Left;1 rep;30 seconds    Piriformis Stretch Limitations  KTOS      Lumbar Exercises: Aerobic   Recumbent Bike  Lvl 1, 6 min       Lumbar Exercises: Supine   Bent Knee Raise  10 reps;3 seconds    Bent Knee Raise Limitations  into red TB at knees    Bridge  10 reps;3 seconds    Bridge Limitations  pain on first repetition       Moist Heat Therapy   Number Minutes Moist Heat  10 Minutes    Moist Heat Location  Lumbar Spine      Manual Therapy   Manual Therapy  Soft tissue mobilization;Passive ROM    Manual therapy comments  sidelying     Soft tissue mobilization  R lumbar paraspinals, R QL, R superior buttocks - no sig ttp     Passive ROM  R manual QL stretch with therapist in sidelying with therapist support to R LE lowering off table             PT Education - 07/25/19 1211    Education Details  HEP update; SKTC  stretch, bridge    Person(s) Educated  Patient    Methods  Explanation;Demonstration;Handout;Verbal cues    Comprehension  Verbalized understanding;Returned demonstration;Verbal cues required       PT Short Term Goals - 07/25/19 1033      PT SHORT TERM GOAL #1   Title  Patient to be independent with initial HEP.    Time  2    Period  Weeks    Status  Achieved    Target Date  07/25/19        PT Long Term Goals - 07/15/19 1029      PT LONG TERM GOAL #1   Title  Patient to be independent with advanced HEP.    Time  4    Period  Weeks    Status  On-going      PT LONG TERM GOAL #2   Title  Patient to demonstrate lumbar AROM WFL and nonpainful.    Time  4    Period  Weeks    Status  On-going      PT LONG TERM GOAL #3   Title  Patient to demonstrate B hip strength >/=4+/5.    Time  4    Period  Weeks    Status  On-going      PT LONG TERM GOAL #4   Title  Patient to report tolerance of walking 1 mild/day without pain limiting.    Time  4    Period  Weeks    Status  On-going      PT LONG TERM GOAL #5   Title  Patient to report 0/10 pain with bed mobility.    Time  4    Period  Weeks    Status  On-going            Plan - 07/25/19 1033    Clinical Impression Statement  Pt. reporting initial HEP going well and denies questions thus STG #1 achieved.  Reports some pain in R lateral lower back which he feels has not been addressed much with recent activities in therapy.  STM to lumbar paraspinals, R superior glute, R QL not revealing ttp however pt. noting some relief from pain with SKTC and sidelying QL stretch today.  HEP updated and lumbopelvic strengthening activities  somewhat progressed today which was well tolerated.  Will monitor response to updated HEP at upcoming session.    Comorbidities  pacemaker, tachycardia/bradycardia, a-fib, kidney stone, HTN, GERD, HLD, CAD, hernie repair, L ankle fx surgery    Rehab Potential  Good    PT Treatment/Interventions   ADLs/Self Care Home Management;Cryotherapy;Moist Heat;Traction;Balance training;Therapeutic exercise;Therapeutic activities;Functional mobility training;Stair training;Gait training;Neuromuscular re-education;Patient/family education;Manual techniques;Taping;Energy conservation;Dry needling;Passive range of motion    PT Next Visit Plan  monitor response to updated HEP    Consulted and Agree with Plan of Care  Patient       Patient will benefit from skilled therapeutic intervention in order to improve the following deficits and impairments:  Decreased activity tolerance, Decreased strength, Pain, Increased muscle spasms, Difficulty walking, Decreased balance, Decreased range of motion, Improper body mechanics, Postural dysfunction, Impaired flexibility  Visit Diagnosis: Acute right-sided low back pain without sciatica  Difficulty in walking, not elsewhere classified  Other symptoms and signs involving the musculoskeletal system     Problem List Patient Active Problem List   Diagnosis Date Noted  . Symptomatic bradycardia 05/31/2019  . Vertigo 07/27/2018  . Multiple closed fractures of ribs of right side 01/21/2018  . Thiamine deficiency 01/19/2018  . Peripheral neuropathy 07/13/2017  . Dyslipidemia 07/13/2017  . Preventative health care 01/11/2017  . Carotid artery disease ([place]) 07/01/2016  . Mitral valve disorder 07/01/2016  . Low back pain 12/31/2015  . Osteopenia 12/31/2015  . Allergic state 12/31/2015  . Skin lesion 12/31/2015  . History of chicken pox   . Calculi, ureter 03/30/2015  . Arthritis of knee, degenerative 05/10/2014  . Pacemaker-St.Jude 02/01/2012  . Permanent atrial fibrillation ([place])   . Hypertension   . Heart murmur   . Chronic anticoagulation     Bess Harvest, PTA 07/25/19 12:12 PM    [facility] [address]  [place], [place], [address] Phone: [phone]   Fax:  [phone]  Name:  YULIAN DOVER MRN: [id number] Date of Birth: [birth year]

## 2019-07-29 ENCOUNTER — Encounter: Payer: Self-pay | Admitting: Physical Therapy

## 2019-07-29 ENCOUNTER — Other Ambulatory Visit: Payer: Self-pay

## 2019-07-29 ENCOUNTER — Ambulatory Visit: Payer: PPO | Admitting: Physical Therapy

## 2019-07-29 DIAGNOSIS — M545 Low back pain, unspecified: Secondary | ICD-10-CM

## 2019-07-29 DIAGNOSIS — R262 Difficulty in walking, not elsewhere classified: Secondary | ICD-10-CM

## 2019-07-29 DIAGNOSIS — R29898 Other symptoms and signs involving the musculoskeletal system: Secondary | ICD-10-CM

## 2019-07-29 NOTE — Therapy (Addendum)
[facility] [address]  [place], [place], [address] Phone: [phone]   Fax:  [phone]  Physical Therapy Treatment  Patient Details  Name: Brian [place] MRN: [id number] Date of Birth: 03/25/1936 Referring Provider (PT): Clearance Coots, MD   Progress Note Reporting Period 07/11/19 to 07/29/19  See note below for Objective Data and Assessment of Progress/Goals.     Encounter Date: 07/29/2019  PT End of Session - 07/29/19 1200    Visit Number  5    Number of Visits  9    Date for PT Re-Evaluation  08/08/19    Authorization Type  [employer]    PT Start Time  1015    PT Stop Time  1053    PT Time Calculation (min)  38 min    Activity Tolerance  Patient tolerated treatment well;Patient limited by pain    Behavior During Therapy  [facility] for tasks assessed/performed       Past Medical History:  Diagnosis Date  . Arthritis   . Biliary dyskinesia   . Carotid artery disease ([place]) 07/01/2016  . Dyslipidemia 07/13/2017  . GERD (gastroesophageal reflux disease)   . H/O measles   . History of chicken pox   . Hypertension   . Kidney stone 03/2015  . Persistent atrial fibrillation ([place])   . Pneumonia 1992  . Tachycardia-bradycardia ([place])    a. s/p STJ dual chamber pacemaker  . Thiamine deficiency 01/19/2018  . Valvular heart disease 07/01/2016    Past Surgical History:  Procedure Laterality Date  . CHOLECYSTECTOMY N/A 11/24/2015   Procedure: LAPAROSCOPIC CHOLECYSTECTOMY;  Surgeon: Greer Pickerel, MD;  Location: [facility];  Service: General;  Laterality: N/A;  . CYSTOSCOPY  2016  . FRACTURE SURGERY  2005   left ankle  . HERNIA REPAIR     left groin, no mesh  . PERMANENT PACEMAKER INSERTION N/A 01/31/2012   SJM Accent SR RF implanted by DR Allred    There were no vitals filed for this visit.  Subjective Assessment - 07/29/19 1013    Subjective  Would like to wrap up with PT today as he feels he is 95%  better and is able to do his exercises on his own.    Pertinent History  pacemaker, tachycardia/bradycardia, a-fib, kidney stone, HTN, GERD, HLD, CAD, hernie repair, L ankle fx surgery    Diagnostic tests  09/06/18 lumbar xray: Osteoarthritic change, most severe at L5-S1, similar to recent study. No fracture or spondylolisthesis. There is aortoiliac atherosclerosis.    Patient Stated Goals  get away from the pain    Currently in Pain?  No/denies    Pain Onset  More than a month ago         [facility] PT Assessment - 07/29/19 0001      Observation/Other Assessments   Focus on Therapeutic Outcomes (FOTO)   Lumbar: 94 (6% limited, 16% predicted)      AROM   AROM Assessment Site  Lumbar    Lumbar Flexion  toes    Lumbar Extension  Severely limited   mild pain   Lumbar - Right Side Bend  distal thigh   mild pain in R LB   Lumbar - Left Side Bend  distal thigh   mild pain in R LB   Lumbar - Right Rotation  WFL    Lumbar - Left Rotation  [facility]      Strength   Right Hip Flexion  4/5  Right Hip ABduction  4+/5    Right Hip ADduction  4+/5    Left Hip Flexion  4/5    Left Hip ABduction  4/5    Left Hip ADduction  4+/5                   [facility] Adult PT Treatment/Exercise - 07/29/19 0001      Lumbar Exercises: Aerobic   Nustep  Lvl 3, 6 min (UEs/LEs)      Lumbar Exercises: Standing   Other Standing Lumbar Exercises  sitting anterior/posterior pelvic tilts x10    Other Standing Lumbar Exercises  resisted alt marching with red TB around toes x20   cues to maintain chest up     Lumbar Exercises: Supine   Bridge  5 reps    Bridge Limitations  2nd set with red TB above knees      Lumbar Exercises: Sidelying   Hip Abduction  Right;Left;10 reps    Hip Abduction Limitations  cues for alignment             PT Education - 07/29/19 1159    Education Details  HEP update & consolidation; discussion on objective measurements and remaining deficits    Person(s) Educated   Patient    Methods  Explanation;Demonstration;Tactile cues;Verbal cues;Handout    Comprehension  Verbalized understanding;Returned demonstration       PT Short Term Goals - 07/29/19 1018      PT SHORT TERM GOAL #1   Title  Patient to be independent with initial HEP.    Time  2    Period  Weeks    Status  Achieved    Target Date  07/25/19        PT Long Term Goals - 07/29/19 1018      PT LONG TERM GOAL #1   Title  Patient to be independent with advanced HEP.    Time  4    Period  Weeks    Status  Achieved   met for current     PT LONG TERM GOAL #2   Title  Patient to demonstrate lumbar AROM WFL and nonpainful.    Time  4    Period  Weeks    Status  Partially Met   improved in R/L rotation     PT LONG TERM GOAL #3   Title  Patient to demonstrate B hip strength >/=4+/5.    Time  4    Period  Weeks    Status  Partially Met   improvements in R hip abduction, B hip adduction     PT LONG TERM GOAL #4   Title  Patient to report tolerance of walking 1 mile/day without pain limiting.    Time  4    Period  Weeks    Status  Partially Met   reporting resolution of "shooting pain" but with intermittent pain returning if "stepping wrong"     PT LONG TERM GOAL #5   Title  Patient to report 0/10 pain with bed mobility.    Time  4    Period  Weeks    Status  Achieved            Plan - 07/29/19 1200    Clinical Impression Statement  Patient arrived to session with report of 95% improvement in LB and requesting to wrap up with PT today. Reports resolution of "shooting pain" with walking his dog 1 mile, with intermittent pain returning if he "steps  wrong." Notes that he is able to perform bed mobility now without issues. Lumbar AROM has improved in R/L rotation, with mild pain and ROM limitation remaining in extension and R/L side bending. Strength testing revealed improvements in R hip abduction and B hip adduction, with B hip flexion strength most limiting. Updated HEP to  reflect remaining deficits- patient reported understanding. Patient has shown progress towards goals at this time and is independent with HEP. Placing patient on 30 day hold at this time per his request.    Comorbidities  pacemaker, tachycardia/bradycardia, a-fib, kidney stone, HTN, GERD, HLD, CAD, hernie repair, L ankle fx surgery    Rehab Potential  Good    PT Treatment/Interventions  ADLs/Self Care Home Management;Cryotherapy;Moist Heat;Traction;Balance training;Therapeutic exercise;Therapeutic activities;Functional mobility training;Stair training;Gait training;Neuromuscular re-education;Patient/family education;Manual techniques;Taping;Energy conservation;Dry needling;Passive range of motion    PT Next Visit Plan  30 day hold at this time    Consulted and Agree with Plan of Care  Patient       Patient will benefit from skilled therapeutic intervention in order to improve the following deficits and impairments:  Decreased activity tolerance, Decreased strength, Pain, Increased muscle spasms, Difficulty walking, Decreased balance, Decreased range of motion, Improper body mechanics, Postural dysfunction, Impaired flexibility  Visit Diagnosis: Acute right-sided low back pain without sciatica  Difficulty in walking, not elsewhere classified  Other symptoms and signs involving the musculoskeletal system     Problem List Patient Active Problem List   Diagnosis Date Noted  . Symptomatic bradycardia 05/31/2019  . Vertigo 07/27/2018  . Multiple closed fractures of ribs of right side 01/21/2018  . Thiamine deficiency 01/19/2018  . Peripheral neuropathy 07/13/2017  . Dyslipidemia 07/13/2017  . Preventative health care 01/11/2017  . Carotid artery disease ([place]) 07/01/2016  . Mitral valve disorder 07/01/2016  . Low back pain 12/31/2015  . Osteopenia 12/31/2015  . Allergic state 12/31/2015  . Skin lesion 12/31/2015  . History of chicken pox   . Calculi, ureter 03/30/2015  . Arthritis of  knee, degenerative 05/10/2014  . Pacemaker-St.Jude 02/01/2012  . Permanent atrial fibrillation ([place])   . Hypertension   . Heart murmur   . Chronic anticoagulation      Janene Harvey, PT, DPT 07/29/19 12:07 PM   [facility] [address]  [place], [place], [address] Phone: [phone]   Fax:  [phone]  Name: [place] SMOKER MRN: [id number] Date of Birth: 1936/02/07  PHYSICAL THERAPY DISCHARGE SUMMARY  Visits from Start of Care: 5  Current functional level related to goals / functional outcomes: See above clinical impression; patient did not return after being placed on 30 day hold   Remaining deficits: See above   Education / Equipment: Decreased lumbar ROM, hip strength, walking tolerance  Plan: Patient agrees to discharge.  Patient goals were partially met. Patient is being discharged due to being pleased with the current functional level.  ?????     Janene Harvey, PT, DPT 09/09/19 10:03 AM

## 2019-07-31 ENCOUNTER — Ambulatory Visit: Payer: PPO

## 2019-08-05 ENCOUNTER — Ambulatory Visit: Payer: PPO

## 2019-08-06 ENCOUNTER — Ambulatory Visit: Payer: PPO | Admitting: Family Medicine

## 2019-08-07 ENCOUNTER — Encounter: Payer: PPO | Admitting: Physical Therapy

## 2019-08-23 DIAGNOSIS — M25561 Pain in right knee: Secondary | ICD-10-CM | POA: Diagnosis not present

## 2019-08-23 DIAGNOSIS — M1711 Unilateral primary osteoarthritis, right knee: Secondary | ICD-10-CM | POA: Diagnosis not present

## 2019-08-31 ENCOUNTER — Other Ambulatory Visit: Payer: Self-pay | Admitting: Family Medicine

## 2019-09-02 DIAGNOSIS — M1711 Unilateral primary osteoarthritis, right knee: Secondary | ICD-10-CM | POA: Diagnosis not present

## 2019-09-02 LAB — CUP PACEART REMOTE DEVICE CHECK
Battery Remaining Longevity: 125 mo
Battery Remaining Percentage: 91 %
Battery Voltage: 2.95 V
Brady Statistic RV Percent Paced: 8.7 %
Date Time Interrogation Session: 20201228032600
Implantable Lead Implant Date: 20130528
Implantable Lead Location: 753860
Implantable Lead Model: 1948
Implantable Pulse Generator Implant Date: 20130528
Lead Channel Impedance Value: 660 Ohm
Lead Channel Pacing Threshold Amplitude: 0.75 V
Lead Channel Pacing Threshold Pulse Width: 0.4 ms
Lead Channel Sensing Intrinsic Amplitude: 12 mV
Lead Channel Setting Pacing Amplitude: 2.5 V
Lead Channel Setting Pacing Pulse Width: 0.4 ms
Lead Channel Setting Sensing Sensitivity: 2 mV
Pulse Gen Model: 1210
Pulse Gen Serial Number: 7328888

## 2019-09-03 ENCOUNTER — Other Ambulatory Visit: Payer: Self-pay | Admitting: Family Medicine

## 2019-09-03 ENCOUNTER — Ambulatory Visit (INDEPENDENT_AMBULATORY_CARE_PROVIDER_SITE_OTHER): Payer: PPO | Admitting: *Deleted

## 2019-09-03 DIAGNOSIS — Z950 Presence of cardiac pacemaker: Secondary | ICD-10-CM

## 2019-09-10 DIAGNOSIS — M1711 Unilateral primary osteoarthritis, right knee: Secondary | ICD-10-CM | POA: Diagnosis not present

## 2019-09-13 ENCOUNTER — Telehealth: Payer: Self-pay | Admitting: Family Medicine

## 2019-09-13 NOTE — Telephone Encounter (Signed)
Called patient to schedule AWV, but no answer. Will try to call patient at a later time. SF

## 2019-09-30 ENCOUNTER — Other Ambulatory Visit: Payer: Self-pay | Admitting: Family Medicine

## 2019-10-20 ENCOUNTER — Other Ambulatory Visit: Payer: Self-pay | Admitting: Family Medicine

## 2019-10-29 DIAGNOSIS — M216X2 Other acquired deformities of left foot: Secondary | ICD-10-CM | POA: Diagnosis not present

## 2019-10-29 DIAGNOSIS — L89892 Pressure ulcer of other site, stage 2: Secondary | ICD-10-CM | POA: Diagnosis not present

## 2019-10-29 DIAGNOSIS — M216X1 Other acquired deformities of right foot: Secondary | ICD-10-CM | POA: Diagnosis not present

## 2019-10-29 DIAGNOSIS — L03115 Cellulitis of right lower limb: Secondary | ICD-10-CM | POA: Diagnosis not present

## 2019-11-14 NOTE — Progress Notes (Signed)
Nurse connected with patient 11/15/19 at  1:00 PM EST by a telephone enabled telemedicine application and verified that I am speaking with the correct person using two identifiers. Patient stated full name and DOB. Patient gave permission to continue with virtual visit. Patient's location was at home and Nurse's location was at [place] office.   Subjective:   Brian Davidson is a 84 y.o. male who presents for Medicare Annual/Subsequent preventive examination.  Review of Systems: No ROS.  Medicare Wellness Virtual Visit.  Visual/audio telehealth visit, UTA vital signs.   See social history for additional risk factors.  Home Safety/Smoke Alarms: Feels safe in home. Smoke alarms in place.  Lives w/ wife in 1 story home. Ramp entry.  Male:   PSA- No results found for: PSA      Objective:    Vitals: BP 127/68 Comment: pt reported  There is no height or weight on file to calculate BMI.  Advanced Directives 11/15/2019 07/11/2019 05/31/2018 01/10/2017 12/16/2015 11/24/2015 11/19/2015  Does Patient Have a Medical Advance Directive? Yes No No Yes Yes - Yes  Type of Paramedic of [place];Living will - - [place];Living will Living will - [place];Living will  Does patient want to make changes to medical advance directive? No - Patient declined - - - - - -  Copy of [place] in Chart? Yes - validated most recent copy scanned in chart (See row information) - - No - copy requested - (No Data) No - copy requested  Would patient like information on creating a medical advance directive? - No - Patient declined No - Patient declined - - - -  Pre-existing out of facility DNR order (yellow form or pink MOST form) - - - - - - -    Tobacco Social History   Tobacco Use  Smoking Status Former Smoker  . Years: 10.00  . Types: Pipe, Cigars  . Quit date: 11/18/1985  . Years since quitting: 34.0  Smokeless Tobacco Never Used       Counseling given: Not Answered   Clinical Intake:     Pain : No/denies pain                 Past Medical History:  Diagnosis Date  . Arthritis   . Biliary dyskinesia   . Carotid artery disease ([place]) 07/01/2016  . Dyslipidemia 07/13/2017  . GERD (gastroesophageal reflux disease)   . H/O measles   . History of chicken pox   . Hypertension   . Kidney stone 03/2015  . Persistent atrial fibrillation ([place])   . Pneumonia 1992  . Tachycardia-bradycardia ([place])    a. s/p STJ dual chamber pacemaker  . Thiamine deficiency 01/19/2018  . Valvular heart disease 07/01/2016   Past Surgical History:  Procedure Laterality Date  . CHOLECYSTECTOMY N/A 11/24/2015   Procedure: LAPAROSCOPIC CHOLECYSTECTOMY;  Surgeon: Greer Pickerel, MD;  Location: [facility];  Service: General;  Laterality: N/A;  . CYSTOSCOPY  2016  . FRACTURE SURGERY  2005   left ankle  . HERNIA REPAIR     left groin, no mesh  . PERMANENT PACEMAKER INSERTION N/A 01/31/2012   SJM Accent SR RF implanted by DR Allred   Family History  Problem Relation Age of Onset  . Hypertension Mother   . Arthritis Mother   . Drug abuse Mother   . Stroke Father   . Heart disease Father   . Heart disease Sister 55  .  Heart disease Brother   . Hypertension Son   . Heart disease Sister   . Neuropathy Brother   . Hypertension Brother   . Atrial fibrillation Brother   . Atrial fibrillation Sister   . Colon cancer Neg Hx    Social History   Socioeconomic History  . Marital status: Married    Spouse name: [name]  . Number of children: 1  . Years of education: Not on file  . Highest education level: Not on file  Occupational History  . Occupation: Retired  . 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 Smoking status: Former Smoker    Years: 10.00    Types: Pipe, Cigars    Quit date: 11/18/1985    Years since quitting: 34.0  . Smokeless tobacco: Never Used  Substance and Sexual Activity  . Alcohol use: Yes     Alcohol/week: 3.0 standard drinks    Types: 3 Cans of beer per week    Comment: occ  . Drug use: No  . Sexual activity: Never  Other Topics Concern  . Not on file  Social History Narrative   Lives with wife, still works part-time as a Geophysicist/field seismologist. He is active around the house and yard...   Parents are deceased and did not have cardiac issues but he has 2 siblings with atrial fibrillation and a sister-in-law  has a pacemaker.   Social Determinants of Health   Financial Resource Strain: Low Risk   . Difficulty of Paying Living Expenses: Not hard at all  Food Insecurity: No Food Insecurity  . Worried About Charity fundraiser in the Last Year: Never true  . Ran Out of Food in the Last Year: Never true  Transportation Needs: No Transportation Needs  . Lack of Transportation (Medical): No  . Lack of Transportation (Non-Medical): No  Physical Activity:   . Days of Exercise per Week:   . Minutes of Exercise per Session:   Stress:   . Feeling of Stress :   Social Connections:   . Frequency of Communication with Friends and Family:   . Frequency of Social Gatherings with Friends and Family:   . Attends Religious Services:   . Active Member of Clubs or Organizations:   . Attends Archivist Meetings:   Marland Kitchen Marital Status:     Outpatient Encounter Medications as of 11/15/2019  Medication Sig  . Calcium Carbonate-Vitamin D (CALTRATE 600+D PO) Take by mouth.  . Cholecalciferol 50 MCG (2000 UT) TABS Take by mouth.  . Cyanocobalamin (VITAMIN B 12 PO) Take 5,000 mcg by mouth.  . diltiazem (CARDIZEM CD) 240 MG 24 hr capsule TAKE ONE (1) CAPSULE EACH DAY BY MOUTH  . gabapentin (NEURONTIN) 300 MG capsule Take 1 capsule (300 mg total) by mouth 2 (two) times daily.  . irbesartan-hydrochlorothiazide (AVALIDE) 150-12.5 MG tablet Take 1/2 (one-half) tablet by mouth once daily  . loratadine (CLARITIN) 10 MG tablet Take 10 mg by mouth daily as needed. Reported on 12/31/2015  . magnesium oxide  (MAG-OX) 400 MG tablet Take 400 mg by mouth daily.  . metoprolol tartrate (LOPRESSOR) 100 MG tablet Take 1 tablet by mouth twice daily  . rivaroxaban (XARELTO) 20 MG TABS tablet Take 1 tablet (20 mg total) by mouth daily.  . Turmeric 500 MG TABS Take by mouth.  . rivaroxaban (XARELTO) 20 MG TABS tablet Take 20 mg by mouth daily with supper. Samples given by Dr. Charlett Blake  Patient can receive 4 bottles of  Xarelto 20mg   2 times a year  . [DISCONTINUED] ciprofloxacin (CIPRO) 500 MG tablet Take 1 tablet (500 mg total) by mouth 2 (two) times daily. (Patient not taking: Reported on 07/11/2019)  . [DISCONTINUED] HYDROcodone-acetaminophen (NORCO) 5-325 MG tablet Take 1 tablet by mouth every 6 (six) hours as needed for moderate pain. (Patient not taking: Reported on 07/11/2019)  . [DISCONTINUED] metroNIDAZOLE (FLAGYL) 500 MG tablet Take 1 tablet (500 mg total) by mouth 3 (three) times daily. (Patient not taking: Reported on 07/11/2019)   No facility-administered encounter medications on file as of 11/15/2019.    Activities of Daily Living In your present state of health, do you have any difficulty performing the following activities: 11/15/2019  Hearing? Y  Comment wears hearing aids.  Vision? N  Difficulty concentrating or making decisions? N  Walking or climbing stairs? N  Dressing or bathing? N  Doing errands, shopping? N  Preparing Food and eating ? N  Using the Toilet? N  In the past six months, have you accidently leaked urine? N  Do you have problems with loss of bowel control? N  Managing your Medications? N  Managing your Finances? N  Housekeeping or managing your Housekeeping? N  Some recent data might be hidden    Patient Care Team: Mosie Lukes, MD as PCP - General (Family Medicine) Thompson Grayer, MD as PCP - Electrophysiology (Cardiology) Thompson Grayer, MD as Consulting Physician (Cardiology) Franchot Mimes, MD as Consulting Physician (Family Medicine) Armbruster, Carlota Raspberry, MD  as Consulting Physician (Gastroenterology) Gaynelle Arabian, MD as Consulting Physician (Orthopedic Surgery)   Assessment:   This is a routine wellness examination for Brian Davidson. Physical assessment deferred to PCP.  Exercise Activities and Dietary recommendations Current Exercise Habits: Home exercise routine, Type of exercise: walking, Time (Minutes): 30, Frequency (Times/Week): 5, Weekly Exercise (Minutes/Week): 150, Intensity: Mild, Exercise limited by: None identified Diet (meal preparation, eat out, water intake, caffeinated beverages, dairy products, fruits and vegetables): in general, a "healthy" diet  , well balanced      Goals    . Maintain current active lifestyle       Fall Risk Fall Risk  11/15/2019 01/19/2018 01/10/2017 09/28/2015  Falls in the past year? 0 Yes No No  Number falls in past yr: 0 1 - -  Injury with Fall? 0 Yes - -  Follow up Education provided;Falls prevention discussed - - -   Depression Screen PHQ 2/9 Scores 11/15/2019 01/19/2018 01/10/2017 09/28/2015  PHQ - 2 Score 0 0 0 0    Cognitive Function Ad8 score reviewed for issues:  Issues making decisions:no  Less interest in hobbies / activities:no  Repeats questions, stories (family complaining):no  Trouble using ordinary gadgets (microwave, computer, phone):no  Forgets the month or year: no  Mismanaging finances: no  Remembering appts:no  Daily problems with thinking and/or memory:no Ad8 score is=0     MMSE - Mini Mental State Exam 01/10/2017  Orientation to time 5  Orientation to Place 5  Registration 3  Attention/ Calculation 5  Recall 2  Language- name 2 objects 2  Language- repeat 1  Language- follow 3 step command 3  Language- read & follow direction 1  Write a sentence 1  Copy design 1  Total score 29        Immunization History  Administered Date(s) Administered  . Influenza, High Dose Seasonal PF 06/18/2017, 06/11/2018, 04/29/2019  . Influenza-Unspecified 06/11/2016  .  Pneumococcal Conjugate-13 07/20/2015  . Pneumococcal Polysaccharide-23 09/05/2016  . Tdap  11/14/2014, 05/29/2019  . Zoster Recombinat (Shingrix) 12/26/2016, 03/21/2017   Screening Tests Health Maintenance  Topic Date Due  . TETANUS/TDAP  05/28/2029  . INFLUENZA VACCINE  Completed  . PNA vac Low Risk Adult  Completed       Plan:   See you next year!  Continue to eat heart healthy diet (full of fruits, vegetables, whole grains, lean protein, water--limit salt, fat, and sugar intake) and increase physical activity as tolerated.  Continue doing brain stimulating activities (puzzles, reading, adult coloring books, staying active) to keep memory sharp.   Bring a copy of your living will and/or healthcare power of attorney to your next office visit.   I have personally reviewed and noted the following in the patient's chart:   . Medical and social history . Use of alcohol, tobacco or illicit drugs  . Current medications and supplements . Functional ability and status . Nutritional status . Physical activity . Advanced directives . List of other physicians . Hospitalizations, surgeries, and ER visits in previous 12 months . Vitals . Screenings to include cognitive, depression, and falls . Referrals and appointments  In addition, I have reviewed and discussed with patient certain preventive protocols, quality metrics, and best practice recommendations. A written personalized care plan for preventive services as well as general preventive health recommendations were provided to patient.     Shela Nevin, [place]  11/15/2019

## 2019-11-15 ENCOUNTER — Ambulatory Visit (INDEPENDENT_AMBULATORY_CARE_PROVIDER_SITE_OTHER): Payer: PPO | Admitting: *Deleted

## 2019-11-15 ENCOUNTER — Encounter: Payer: Self-pay | Admitting: *Deleted

## 2019-11-15 ENCOUNTER — Other Ambulatory Visit: Payer: Self-pay

## 2019-11-15 VITALS — BP 127/68

## 2019-11-15 DIAGNOSIS — Z0000 Encounter for general adult medical examination without abnormal findings: Secondary | ICD-10-CM

## 2019-11-15 NOTE — Patient Instructions (Signed)
See you next year!  Continue to eat heart healthy diet (full of fruits, vegetables, whole grains, lean protein, water--limit salt, fat, and sugar intake) and increase physical activity as tolerated.  Continue doing brain stimulating activities (puzzles, reading, adult coloring books, staying active) to keep memory sharp.   Bring a copy of your living will and/or healthcare power of attorney to your next office visit.   Brian Davidson , Thank you for taking time to come for your Medicare Wellness Visit. I appreciate your ongoing commitment to your health goals. Please review the following plan we discussed and let me know if I can assist you in the future.   These are the goals we discussed: Goals    . Maintain current active lifestyle       This is a list of the screening recommended for you and due dates:  Health Maintenance  Topic Date Due  . Tetanus Vaccine  05/28/2029  . Flu Shot  Completed  . Pneumonia vaccines  Completed    Preventive Care 58 Years and Older, Male Preventive care refers to lifestyle choices and visits with your health care provider that can promote health and wellness. This includes:  A yearly physical exam. This is also called an annual well check.  Regular dental and eye exams.  Immunizations.  Screening for certain conditions.  Healthy lifestyle choices, such as diet and exercise. What can I expect for my preventive care visit? Physical exam Your health care provider will check:  Height and weight. These may be used to calculate body mass index (BMI), which is a measurement that tells if you are at a healthy weight.  Heart rate and blood pressure.  Your skin for abnormal spots. Counseling Your health care provider may ask you questions about:  Alcohol, tobacco, and drug use.  Emotional well-being.  Home and relationship well-being.  Sexual activity.  Eating habits.  History of falls.  Memory and ability to understand  (cognition).  Work and work Statistician. What immunizations do I need?  Influenza (flu) vaccine  This is recommended every year. Tetanus, diphtheria, and pertussis (Tdap) vaccine  You may need a Td booster every 10 years. Varicella (chickenpox) vaccine  You may need this vaccine if you have not already been vaccinated. Zoster (shingles) vaccine  You may need this after age 62. Pneumococcal conjugate (PCV13) vaccine  One dose is recommended after age 41. Pneumococcal polysaccharide (PPSV23) vaccine  One dose is recommended after age 41. Measles, mumps, and rubella (MMR) vaccine  You may need at least one dose of MMR if you were born in 1957 or later. You may also need a second dose. Meningococcal conjugate (MenACWY) vaccine  You may need this if you have certain conditions. Hepatitis A vaccine  You may need this if you have certain conditions or if you travel or work in places where you may be exposed to hepatitis A. Hepatitis B vaccine  You may need this if you have certain conditions or if you travel or work in places where you may be exposed to hepatitis B. Haemophilus influenzae type b (Hib) vaccine  You may need this if you have certain conditions. You may receive vaccines as individual doses or as more than one vaccine together in one shot (combination vaccines). Talk with your health care provider about the risks and benefits of combination vaccines. What tests do I need? Blood tests  Lipid and cholesterol levels. These may be checked every 5 years, or more frequently depending on  your overall health.  Hepatitis C test.  Hepatitis B test. Screening  Lung cancer screening. You may have this screening every year starting at age 32 if you have a 30-pack-year history of smoking and currently smoke or have quit within the past 15 years.  Colorectal cancer screening. All adults should have this screening starting at age 78 and continuing until age 69. Your health  care provider may recommend screening at age 54 if you are at increased risk. You will have tests every 1-10 years, depending on your results and the type of screening test.  Prostate cancer screening. Recommendations will vary depending on your family history and other risks.  Diabetes screening. This is done by checking your blood sugar (glucose) after you have not eaten for a while (fasting). You may have this done every 1-3 years.  Abdominal aortic aneurysm (AAA) screening. You may need this if you are a current or former smoker.  Sexually transmitted disease (STD) testing. Follow these instructions at home: Eating and drinking  Eat a diet that includes fresh fruits and vegetables, whole grains, lean protein, and low-fat dairy products. Limit your intake of foods with high amounts of sugar, saturated fats, and salt.  Take vitamin and mineral supplements as recommended by your health care provider.  Do not drink alcohol if your health care provider tells you not to drink.  If you drink alcohol: ? Limit how much you have to 0-2 drinks a day. ? Be aware of how much alcohol is in your drink. In the [place] one drink equals one 12 oz bottle of beer (355 mL), one 5 oz glass of wine (148 mL), or one 1 oz glass of hard liquor (44 mL). Lifestyle  Take daily care of your teeth and gums.  Stay active. Exercise for at least 30 minutes on 5 or more days each week.  Do not use any products that contain nicotine or tobacco, such as cigarettes, e-cigarettes, and chewing tobacco. If you need help quitting, ask your health care provider.  If you are sexually active, practice safe sex. Use a condom or other form of protection to prevent STIs (sexually transmitted infections).  Talk with your health care provider about taking a low-dose aspirin or statin. What's next?  Visit your health care provider once a year for a well check visit.  Ask your health care provider how often you should have your  eyes and teeth checked.  Stay up to date on all vaccines. This information is not intended to replace advice given to you by your health care provider. Make sure you discuss any questions you have with your health care provider. Document Revised: 08/16/2018 Document Reviewed: 08/16/2018 Elsevier Patient Education  2020 [employer].

## 2019-11-18 ENCOUNTER — Other Ambulatory Visit: Payer: Self-pay

## 2019-11-18 ENCOUNTER — Ambulatory Visit (INDEPENDENT_AMBULATORY_CARE_PROVIDER_SITE_OTHER): Payer: PPO | Admitting: Family Medicine

## 2019-11-18 DIAGNOSIS — E519 Thiamine deficiency, unspecified: Secondary | ICD-10-CM

## 2019-11-18 DIAGNOSIS — E785 Hyperlipidemia, unspecified: Secondary | ICD-10-CM

## 2019-11-18 DIAGNOSIS — I10 Essential (primary) hypertension: Secondary | ICD-10-CM | POA: Diagnosis not present

## 2019-11-18 DIAGNOSIS — I4821 Permanent atrial fibrillation: Secondary | ICD-10-CM | POA: Diagnosis not present

## 2019-11-18 LAB — LIPID PANEL
Cholesterol: 160 mg/dL (ref 0–200)
HDL: 32.7 mg/dL — ABNORMAL LOW (ref >39.00)
LDL Cholesterol: 108 mg/dL — ABNORMAL HIGH (ref 0–99)
NonHDL: 127.53
Total CHOL/HDL Ratio: 5
Triglycerides: 99 mg/dL (ref 0.0–149.0)
VLDL: 19.8 mg/dL (ref 0.0–40.0)

## 2019-11-18 LAB — COMPREHENSIVE METABOLIC PANEL
ALT: 13 U/L (ref 0–53)
AST: 17 U/L (ref 0–37)
Albumin: 3.8 g/dL (ref 3.5–5.2)
Alkaline Phosphatase: 72 U/L (ref 39–117)
BUN: 23 mg/dL (ref 6–23)
CO2: 28 mEq/L (ref 19–32)
Calcium: 9.3 mg/dL (ref 8.4–10.5)
Chloride: 104 mEq/L (ref 96–112)
Creatinine, Ser: 1.31 mg/dL (ref 0.40–1.50)
GFR: 52.36 mL/min — ABNORMAL LOW (ref >60.00)
Glucose, Bld: 98 mg/dL (ref 70–99)
Potassium: 4.1 mEq/L (ref 3.5–5.1)
Sodium: 140 mEq/L (ref 135–145)
Total Bilirubin: 1 mg/dL (ref 0.2–1.2)
Total Protein: 6.7 g/dL (ref 6.0–8.3)

## 2019-11-18 LAB — CBC
HCT: 44.9 % (ref 39.0–52.0)
Hemoglobin: 15.1 g/dL (ref 13.0–17.0)
MCHC: 33.7 g/dL (ref 30.0–36.0)
MCV: 90.4 fl (ref 78.0–100.0)
Platelets: 157 10*3/uL (ref 150.0–400.0)
RBC: 4.97 Mil/uL (ref 4.22–5.81)
RDW: 13.9 % (ref 11.5–15.5)
WBC: 6.2 10*3/uL (ref 4.0–10.5)

## 2019-11-18 LAB — TSH: TSH: 2.36 u[IU]/mL (ref 0.35–4.50)

## 2019-11-18 NOTE — Patient Instructions (Signed)
[employer] Blood Pressure cuff, upper arm, want BP 100-140/60-90 Pulse oximeter, want oxygen in 90s  Weekly vitals  Take Multivitamin with minerals, selenium Vitamin D 1000-2000 IU daily Probiotic with lactobacillus and bifidophilus Asprin EC 81 mg daily  Melatonin 2-5 mg at bedtime  [email or web address].[email or web address] ToxicBlast.pl Hypertension, Adult Hypertension is another name for high blood pressure. High blood pressure forces your heart to work harder to pump blood. This can cause problems over time. There are two numbers in a blood pressure reading. There is a top number (systolic) over a bottom number (diastolic). It is best to have a blood pressure that is below 120/80. Healthy choices can help lower your blood pressure, or you may need medicine to help lower it. What are the causes? The cause of this condition is not known. Some conditions may be related to high blood pressure. What increases the risk?  Smoking.  Having type 2 diabetes mellitus, high cholesterol, or both.  Not getting enough exercise or physical activity.  Being overweight.  Having too much fat, sugar, calories, or salt (sodium) in your diet.  Drinking too much alcohol.  Having long-term (chronic) kidney disease.  Having a family history of high blood pressure.  Age. Risk increases with age.  Race. You may be at higher risk if you are African American.  Gender. Men are at higher risk than women before age 23. After age 49, women are at higher risk than men.  Having obstructive sleep apnea.  Stress. What are the signs or symptoms?  High blood pressure may not cause symptoms. Very high blood pressure (hypertensive crisis) may cause: ? Headache. ? Feelings of worry or nervousness (anxiety). ? Shortness of breath. ? Nosebleed. ? A feeling of being sick to your stomach (nausea). ? Throwing up (vomiting). ? Changes in how you see. ? Very bad chest pain. ? Seizures. How is this  treated?  This condition is treated by making healthy lifestyle changes, such as: ? Eating healthy foods. ? Exercising more. ? Drinking less alcohol.  Your health care provider may prescribe medicine if lifestyle changes are not enough to get your blood pressure under control, and if: ? Your top number is above 130. ? Your bottom number is above 80.  Your personal target blood pressure may vary. Follow these instructions at home: Eating and drinking   If told, follow the DASH eating plan. To follow this plan: ? Fill one half of your plate at each meal with fruits and vegetables. ? Fill one fourth of your plate at each meal with whole grains. Whole grains include whole-wheat pasta, brown rice, and whole-grain bread. ? Eat or drink low-fat dairy products, such as skim milk or low-fat yogurt. ? Fill one fourth of your plate at each meal with low-fat (lean) proteins. Low-fat proteins include fish, chicken without skin, eggs, beans, and tofu. ? Avoid fatty meat, cured and processed meat, or chicken with skin. ? Avoid pre-made or processed food.  Eat less than 1,500 mg of salt each day.  Do not drink alcohol if: ? Your doctor tells you not to drink. ? You are pregnant, may be pregnant, or are planning to become pregnant.  If you drink alcohol: ? Limit how much you use to:  0-1 drink a day for women.  0-2 drinks a day for men. ? Be aware of how much alcohol is in your drink. In the [place] one drink equals one 12 oz bottle of beer (355 mL), one 5 oz glass of wine (  148 mL), or one 1 oz glass of hard liquor (44 mL). Lifestyle   Work with your doctor to stay at a healthy weight or to lose weight. Ask your doctor what the best weight is for you.  Get at least 30 minutes of exercise most days of the week. This may include walking, swimming, or biking.  Get at least 30 minutes of exercise that strengthens your muscles (resistance exercise) at least 3 days a week. This may include  lifting weights or doing Pilates.  Do not use any products that contain nicotine or tobacco, such as cigarettes, e-cigarettes, and chewing tobacco. If you need help quitting, ask your doctor.  Check your blood pressure at home as told by your doctor.  Keep all follow-up visits as told by your doctor. This is important. Medicines  Take over-the-counter and prescription medicines only as told by your doctor. Follow directions carefully.  Do not skip doses of blood pressure medicine. The medicine does not work as well if you skip doses. Skipping doses also puts you at risk for problems.  Ask your doctor about side effects or reactions to medicines that you should watch for. Contact a doctor if you:  Think you are having a reaction to the medicine you are taking.  Have headaches that keep coming back (recurring).  Feel dizzy.  Have swelling in your ankles.  Have trouble with your vision. Get help right away if you:  Get a very bad headache.  Start to feel mixed up (confused).  Feel weak or numb.  Feel faint.  Have very bad pain in your: ? Chest. ? Belly (abdomen).  Throw up more than once.  Have trouble breathing. Summary  Hypertension is another name for high blood pressure.  High blood pressure forces your heart to work harder to pump blood.  For most people, a normal blood pressure is less than 120/80.  Making healthy choices can help lower blood pressure. If your blood pressure does not get lower with healthy choices, you may need to take medicine. This information is not intended to replace advice given to you by your health care provider. Make sure you discuss any questions you have with your health care provider. Document Revised: 05/02/2018 Document Reviewed: 05/02/2018 Elsevier Patient Education  2020 [employer].

## 2019-11-18 NOTE — Progress Notes (Signed)
Subjective:    Patient ID: Brian Davidson, male    DOB: 21-Nov-1935, 84 y.o.   MRN: [id number]  Chief Complaint  Patient presents with  . 6 month follow up    HPI Patient is in today for chronic medical concerns. No recent febrile illness or hospitalizations. He feels well. Has been maintaining quarantine well. Is doing well. Denies CP/palp/SOB/HA/congestion/fevers/GI or GU c/o. Taking meds as prescribed. Walking several miles a day. Is still struggling with right knee pain even after shots. Has an appointment this week. No recent injury or fall. Is eating a heart healthy diet.   Past Medical History:  Diagnosis Date  . Arthritis   . Biliary dyskinesia   . Carotid artery disease ([place]) 07/01/2016  . Dyslipidemia 07/13/2017  . GERD (gastroesophageal reflux disease)   . H/O measles   . History of chicken pox   . Hypertension   . Kidney stone 03/2015  . Persistent atrial fibrillation ([place])   . Pneumonia 1992  . Tachycardia-bradycardia ([place])    a. s/p STJ dual chamber pacemaker  . Thiamine deficiency 01/19/2018  . Valvular heart disease 07/01/2016    Past Surgical History:  Procedure Laterality Date  . CHOLECYSTECTOMY N/A 11/24/2015   Procedure: LAPAROSCOPIC CHOLECYSTECTOMY;  Surgeon: Greer Pickerel, MD;  Location: [facility];  Service: General;  Laterality: N/A;  . CYSTOSCOPY  2016  . FRACTURE SURGERY  2005   left ankle  . HERNIA REPAIR     left groin, no mesh  . PERMANENT PACEMAKER INSERTION N/A 01/31/2012   SJM Accent SR RF implanted by DR Allred    Family History  Problem Relation Age of Onset  . Hypertension Mother   . Arthritis Mother   . Drug abuse Mother   . Stroke Father   . Heart disease Father   . Heart disease Sister 65  . Heart disease Brother   . Hypertension Son   . Heart disease Sister   . Neuropathy Brother   . Hypertension Brother   . Atrial fibrillation Brother   . Atrial fibrillation Sister   . Colon cancer Neg Hx     Social History    Socioeconomic History  . Marital status: Married    Spouse name: [name]  . Number of children: 1  . Years of education: Not on file  . Highest education level: Not on file  Occupational History  . Occupation: Retired  . 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 Smoking status: Former Smoker    Years: 10.00    Types: Pipe, Cigars    Quit date: 11/18/1985    Years since quitting: 34.0  . Smokeless tobacco: Never Used  Substance and Sexual Activity  . Alcohol use: Yes    Alcohol/week: 3.0 standard drinks    Types: 3 Cans of beer per week    Comment: occ  . Drug use: No  . Sexual activity: Never  Other Topics Concern  . Not on file  Social History Narrative   Lives with wife, still works part-time as a Geophysicist/field seismologist. He is active around the house and yard...   Parents are deceased and did not have cardiac issues but he has 2 siblings with atrial fibrillation and a sister-in-law  has a pacemaker.   Social Determinants of Health   Financial Resource Strain: Low Risk   . Difficulty of Paying Living Expenses: Not hard at all  Food Insecurity: No Food Insecurity  . Worried About Charity fundraiser  in the Last Year: Never true  . Ran Out of Food in the Last Year: Never true  Transportation Needs: No Transportation Needs  . Lack of Transportation (Medical): No  . Lack of Transportation (Non-Medical): No  Physical Activity:   . Days of Exercise per Week:   . Minutes of Exercise per Session:   Stress:   . Feeling of Stress :   Social Connections:   . Frequency of Communication with Friends and Family:   . Frequency of Social Gatherings with Friends and Family:   . Attends Religious Services:   . Active Member of Clubs or Organizations:   . Attends Archivist Meetings:   Marland Kitchen Marital Status:   Intimate Partner Violence:   . Fear of Current or Ex-Partner:   . Emotionally Abused:   Marland Kitchen Physically Abused:   . Sexually Abused:     Outpatient Medications  Prior to Visit  Medication Sig Dispense Refill  . Calcium Carbonate-Vitamin D (CALTRATE 600+D PO) Take by mouth.    . Cholecalciferol 50 MCG (2000 UT) TABS Take by mouth.    . Cyanocobalamin (VITAMIN B 12 PO) Take 5,000 mcg by mouth.    . diltiazem (CARDIZEM CD) 240 MG 24 hr capsule TAKE ONE (1) CAPSULE EACH DAY BY MOUTH 90 capsule 3  . gabapentin (NEURONTIN) 300 MG capsule Take 1 capsule (300 mg total) by mouth 2 (two) times daily. 180 capsule 3  . irbesartan-hydrochlorothiazide (AVALIDE) 150-12.5 MG tablet Take 1/2 (one-half) tablet by mouth once daily 45 tablet 0  . loratadine (CLARITIN) 10 MG tablet Take 10 mg by mouth daily as needed. Reported on 12/31/2015    . magnesium oxide (MAG-OX) 400 MG tablet Take 400 mg by mouth daily.    . metoprolol tartrate (LOPRESSOR) 100 MG tablet Take 1 tablet by mouth twice daily 180 tablet 0  . rivaroxaban (XARELTO) 20 MG TABS tablet Take 1 tablet (20 mg total) by mouth daily. 90 tablet 3  . rivaroxaban (XARELTO) 20 MG TABS tablet Take 20 mg by mouth daily with supper. Samples given by Dr. Charlett Blake  Patient can receive 4 bottles of Xarelto 20mg   2 times a year    . Turmeric 500 MG TABS Take by mouth.     No facility-administered medications prior to visit.    Allergies  Allergen Reactions  . Cardizem [Diltiazem]     Rash from Yellow dye in generic capsule. Can take different brand. Takes diltiazem - his allergy is to Cardizem  . Diltiazem Hcl     Rash from red dye in generic capsule. Can take different brand. Takes diltiazem - his allergy is to Cardizem Rash from red dye in generic capsule. Can take different brand. Takes diltiazem - his allergy is to Cardizem  . Sulfa Antibiotics     Unknown childhood reaction    Review of Systems  Constitutional: Negative for fever and malaise/fatigue.  HENT: Negative for congestion.   Eyes: Negative for blurred vision.  Respiratory: Negative for shortness of breath.   Cardiovascular: Negative for  chest pain, palpitations and leg swelling.  Gastrointestinal: Negative for abdominal pain, blood in stool and nausea.  Genitourinary: Negative for dysuria and frequency.  Musculoskeletal: Negative for falls.  Skin: Negative for rash.  Neurological: Negative for dizziness, loss of consciousness and headaches.  Endo/Heme/Allergies: Negative for environmental allergies.  Psychiatric/Behavioral: Negative for depression. The patient is not nervous/anxious.        Objective:    Physical Exam Vitals and nursing note  reviewed.  Constitutional:      General: He is not in acute distress.    Appearance: He is well-developed.  HENT:     Head: Normocephalic and atraumatic.     Nose: Nose normal.  Eyes:     General:        Right eye: No discharge.        Left eye: No discharge.  Cardiovascular:     Rate and Rhythm: Normal rate. Rhythm irregular.     Heart sounds: No murmur.  Pulmonary:     Effort: Pulmonary effort is normal.     Breath sounds: Normal breath sounds.  Abdominal:     General: Bowel sounds are normal.     Palpations: Abdomen is soft.     Tenderness: There is no abdominal tenderness.  Musculoskeletal:     Cervical back: Normal range of motion and neck supple.  Skin:    General: Skin is warm and dry.  Neurological:     Mental Status: He is alert and oriented to person, place, and time.     BP (!) 122/50 (BP Location: Left Arm, Cuff Size: Large)   Pulse 81   Temp (!) 95.3 F (35.2 C) (Temporal)   Resp 12   Ht 6' (1.829 m)   Wt 221 lb 9.6 oz (100.5 kg)   SpO2 100%   BMI 30.05 kg/m  Wt Readings from Last 3 Encounters:  11/18/19 221 lb 9.6 oz (100.5 kg)  07/09/19 215 lb (97.5 kg)  06/05/19 216 lb 12.8 oz (98.3 kg)    Diabetic Foot Exam - Simple   No data filed     Lab Results  Component Value Date   WBC 7.3 06/05/2019   HGB 14.3 06/05/2019   HCT 43.2 06/05/2019   PLT 164.0 06/05/2019   GLUCOSE 83 06/05/2019   CHOL 157 05/20/2019   TRIG 87.0 05/20/2019    HDL 38.50 (L) 05/20/2019   LDLCALC 101 (H) 05/20/2019   ALT 12 06/05/2019   AST 16 06/05/2019   NA 138 06/05/2019   K 4.3 06/05/2019   CL 102 06/05/2019   CREATININE 1.27 06/05/2019   BUN 21 06/05/2019   CO2 28 06/05/2019   TSH 1.82 05/20/2019   INR 1.20 11/24/2015   HGBA1C 5.9 05/20/2019    Lab Results  Component Value Date   TSH 1.82 05/20/2019   Lab Results  Component Value Date   WBC 7.3 06/05/2019   HGB 14.3 06/05/2019   HCT 43.2 06/05/2019   MCV 90.4 06/05/2019   PLT 164.0 06/05/2019   Lab Results  Component Value Date   NA 138 06/05/2019   K 4.3 06/05/2019   CO2 28 06/05/2019   GLUCOSE 83 06/05/2019   BUN 21 06/05/2019   CREATININE 1.27 06/05/2019   BILITOT 0.9 06/05/2019   ALKPHOS 71 06/05/2019   AST 16 06/05/2019   ALT 12 06/05/2019   PROT 6.6 06/05/2019   ALBUMIN 4.0 06/05/2019   CALCIUM 9.2 06/05/2019   ANIONGAP 8 11/19/2015   GFR 54.33 (L) 06/05/2019   Lab Results  Component Value Date   CHOL 157 05/20/2019   Lab Results  Component Value Date   HDL 38.50 (L) 05/20/2019   Lab Results  Component Value Date   LDLCALC 101 (H) 05/20/2019   Lab Results  Component Value Date   TRIG 87.0 05/20/2019   Lab Results  Component Value Date   CHOLHDL 4 05/20/2019   Lab Results  Component Value Date  HGBA1C 5.9 05/20/2019       Assessment & Plan:   Problem List Items Addressed This Visit    Permanent atrial fibrillation ([place])    Tolerating Xarelto, rate controlled, follows with cardiology      Hypertension    Well controlled, no changes to meds. Encouraged heart healthy diet such as the DASH diet and exercise as tolerated.       Relevant Orders   CBC   Comprehensive metabolic panel   TSH   Dyslipidemia    Encouraged heart healthy diet, increase exercise, avoid trans fats, consider a krill oil cap daily      Relevant Orders   Lipid panel   Thiamine deficiency    Supplement and monitor      Relevant Orders   Vitamin B1       I am having Brian Shad "[name]" maintain his loratadine, Turmeric, Cyanocobalamin (VITAMIN B 12 PO), rivaroxaban, gabapentin, rivaroxaban, metoprolol tartrate, diltiazem, irbesartan-hydrochlorothiazide, magnesium oxide, Cholecalciferol, and Calcium Carbonate-Vitamin D (CALTRATE 600+D PO).  No orders of the defined types were placed in this encounter.    [name], MD

## 2019-11-18 NOTE — Assessment & Plan Note (Signed)
Encouraged heart healthy diet, increase exercise, avoid trans fats, consider a krill oil cap daily 

## 2019-11-18 NOTE — Assessment & Plan Note (Signed)
Supplement and monitor 

## 2019-11-18 NOTE — Assessment & Plan Note (Signed)
Well controlled, no changes to meds. Encouraged heart healthy diet such as the DASH diet and exercise as tolerated.  °

## 2019-11-18 NOTE — Assessment & Plan Note (Signed)
Tolerating Xarelto, rate controlled, follows with cardiology

## 2019-11-20 DIAGNOSIS — L89892 Pressure ulcer of other site, stage 2: Secondary | ICD-10-CM | POA: Diagnosis not present

## 2019-11-20 DIAGNOSIS — L03115 Cellulitis of right lower limb: Secondary | ICD-10-CM | POA: Diagnosis not present

## 2019-11-20 DIAGNOSIS — Z5181 Encounter for therapeutic drug level monitoring: Secondary | ICD-10-CM | POA: Diagnosis not present

## 2019-11-21 DIAGNOSIS — M1711 Unilateral primary osteoarthritis, right knee: Secondary | ICD-10-CM | POA: Diagnosis not present

## 2019-11-21 LAB — VITAMIN B1: Vitamin B1 (Thiamine): 8 nmol/L (ref 8–30)

## 2019-11-26 ENCOUNTER — Telehealth: Payer: Self-pay | Admitting: *Deleted

## 2019-11-26 NOTE — Telephone Encounter (Signed)
   [facility] Pre-operative Risk Assessment    Request for surgical clearance:  1. What type of surgery is being performed? RIGHT TOTAL KNEE ARTHROPLASTY    2. When is this surgery scheduled? 12/23/19   3. What type of clearance is required (medical clearance vs. Pharmacy clearance to hold med vs. Both)? BOTH  4. Are there any medications that need to be held prior to surgery and how long? [place]   5. Practice name and name of physician performing surgery? [facility]; DR. FRANK ALUISIO   6. What is your office phone number [phone]    7.   What is your office fax number [phone] ATTN: KELLY HANCOCK  8.   Anesthesia type (None, local, MAC, general) ? CHOICE   Julaine Hua 11/26/2019, 11:51 AM  _________________________________________________________________   (provider comments below)

## 2019-11-26 NOTE — Telephone Encounter (Signed)
Patient with diagnosis of afib on Xarelto for anticoagulation.    Procedure: RIGHT TOTAL KNEE ARTHROPLASTY  Date of procedure: 12/23/2019  CHADS2-VASc score of  3 ( HTN, AGE, AGE)  CrCl 61 ml/min  Per office protocol, patient can hold Xarelto for 3 days prior to procedure.

## 2019-11-26 NOTE — Telephone Encounter (Signed)
Left message to call back and ask to speak with pre-op team. 

## 2019-11-26 NOTE — Telephone Encounter (Signed)
Pharmacy, please comment on how long patient can hold Xarelto for upcoming knee surgery.  Thank you!

## 2019-11-27 NOTE — Telephone Encounter (Signed)
Patient returning call.

## 2019-11-27 NOTE — Telephone Encounter (Signed)
   Primary Cardiologist: No primary care provider on file. Electrophysiologist: Thompson Grayer, MD   Chart reviewed as part of pre-operative protocol coverage. Patient was contacted 11/27/2019 in reference to pre-operative risk assessment for pending surgery as outlined below.  MURRY CHRISTMAN was last seen on 05/31/2019 by Oda Kilts, PA.  Since that day, GEOVANNI ORLANDI has done well. He is active, walking 2-2.5 miles per day and doing yard work. He has no exertional chest discomfort or shortness of breath. His only limitation is due to knee pain. He has no prior history of MI or CHF. He has a St. Jude pacemaker and rate controlled atrial fibrillation. He had a recent echo in 05/2019 that showed normal EF, stable moderate ascending aortic dilitation and moderated Mitral stenosis.   According to the the RCRI he is a Class I risk with 0.4% risk of major cardiac event perioperatively.  Therefore, based on ACC/AHA guidelines, the patient would be at acceptable risk for the planned procedure without further cardiovascular testing.   According to our pharmacy protocol: CHADS2-VASc score of  3 ( HTN, AGE, AGE) CrCl 61 ml/min Per office protocol, patient can hold Xarelto for 3 days prior to procedure.   Please resume Xarelto post procedure at his home dose for afib and not lower dose for DVT prophylaxis.   I will route this recommendation to the requesting party via Epic fax function and remove from pre-op pool.  Please call with questions.  Daune Perch, NP 11/27/2019, 10:01 AM

## 2019-11-29 ENCOUNTER — Other Ambulatory Visit: Payer: Self-pay

## 2019-11-29 ENCOUNTER — Other Ambulatory Visit (INDEPENDENT_AMBULATORY_CARE_PROVIDER_SITE_OTHER): Payer: PPO

## 2019-11-29 DIAGNOSIS — Z01818 Encounter for other preprocedural examination: Secondary | ICD-10-CM | POA: Diagnosis not present

## 2019-11-29 LAB — POC URINALSYSI DIPSTICK (AUTOMATED)
Bilirubin, UA: NEGATIVE
Blood, UA: NEGATIVE
Glucose, UA: NEGATIVE
Ketones, UA: NEGATIVE
Leukocytes, UA: NEGATIVE
Nitrite, UA: NEGATIVE
Protein, UA: POSITIVE — AB
Spec Grav, UA: 1.015 (ref 1.010–1.025)
Urobilinogen, UA: 1 E.U./dL
pH, UA: 6.5 (ref 5.0–8.0)

## 2019-11-30 LAB — HEMOGLOBIN A1C
Hgb A1c MFr Bld: 5.6 % of total Hgb (ref <5.7)
Mean Plasma Glucose: 114 (calc)
eAG (mmol/L): 6.3 (calc)

## 2019-11-30 LAB — PROTIME-INR
INR: 1.2 — ABNORMAL HIGH
Prothrombin Time: 12 s — ABNORMAL HIGH (ref 9.0–11.5)

## 2019-12-03 ENCOUNTER — Ambulatory Visit (INDEPENDENT_AMBULATORY_CARE_PROVIDER_SITE_OTHER): Payer: PPO | Admitting: *Deleted

## 2019-12-03 ENCOUNTER — Encounter: Payer: Self-pay | Admitting: *Deleted

## 2019-12-03 DIAGNOSIS — Z950 Presence of cardiac pacemaker: Secondary | ICD-10-CM | POA: Diagnosis not present

## 2019-12-03 LAB — CUP PACEART REMOTE DEVICE CHECK
Battery Remaining Longevity: 125 mo
Battery Remaining Percentage: 91 %
Battery Voltage: 2.95 V
Brady Statistic RV Percent Paced: 10 %
Date Time Interrogation Session: 20210330025546
Implantable Lead Implant Date: 20130528
Implantable Lead Location: 753860
Implantable Lead Model: 1948
Implantable Pulse Generator Implant Date: 20130528
Lead Channel Impedance Value: 660 Ohm
Lead Channel Pacing Threshold Amplitude: 0.75 V
Lead Channel Pacing Threshold Pulse Width: 0.4 ms
Lead Channel Sensing Intrinsic Amplitude: 12 mV
Lead Channel Setting Pacing Amplitude: 2.5 V
Lead Channel Setting Pacing Pulse Width: 0.4 ms
Lead Channel Setting Sensing Sensitivity: 2 mV
Pulse Gen Model: 1210
Pulse Gen Serial Number: 7328888

## 2019-12-03 NOTE — Progress Notes (Signed)
PPM remote 

## 2019-12-05 ENCOUNTER — Telehealth: Payer: Self-pay | Admitting: Internal Medicine

## 2019-12-05 NOTE — Telephone Encounter (Signed)
Patient stated someone called him from Dr. Jackalyn Lombard office - spoke with [facility] in device and it was noone in regards to his pacemaker. Did not see any notes about anyone calling him - assume it is for his appt on Tuesday with Dr. Irish Lack.

## 2019-12-06 NOTE — Progress Notes (Signed)
Cardiology Office Note   Date:  12/10/2019   ID:  Brian Davidson, DOB Jan 15, 1936, MRN [id number]  PCP:  Mosie Lukes, MD    No chief complaint on file.  Dilated aortic root  Wt Readings from Last 3 Encounters:  12/10/19 223 lb 6.4 oz (101.3 kg)  11/18/19 221 lb 9.6 oz (100.5 kg)  07/09/19 215 lb (97.5 kg)       History of Present Illness: Brian Davidson is a 84 y.o. male  With a pacemaker, followed by Dr. Rayann Heman.  He saw Dr. Rayford Halsted 20 years ago for AFib.  He had cardioversion many years ago and was treated with Coumadin.  THis was later switched to Xarelto.  He is referred to me by EP PA, Oda Kilts for :"I wanted to establish him with gen cards for management of his non EP issues (HTN, carotid artery disease, moderate ascending aorta dilation, and moderate mitral stenosis) since he has been stable from a PPM/AFib standpoint and only requires annual follow up at this time   Echo in 05/2019: "Left ventricular ejection fraction, by visual estimation, is 60 to  65%. The left ventricle has normal function. Normal left ventricular size.  There is no left ventricular hypertrophy.  2. Left ventricular diastolic function could not be evaluated pattern of  LV diastolic filling.  3. Global right ventricle has normal systolic function.The right  ventricular size is normal. No increase in right ventricular wall  thickness.  4. Left atrial size was moderately dilated.  5. Right atrial size was normal.  6. The mitral valve is normal in structure. No evidence of mitral valve  regurgitation. Moderate mitral stenosis.  7. The tricuspid valve is normal in structure. Tricuspid valve  regurgitation is mild.  8. The aortic valve is normal in structure. Aortic valve regurgitation is  mild by color flow Doppler. Mild aortic valve sclerosis without stenosis.  9. The pulmonic valve was normal in structure. Pulmonic valve  regurgitation is not visualized by color flow Doppler.    10. There is moderate dilatation of the ascending aorta measuring 43 mm.  11. Normal pulmonary artery systolic pressure.  12. The inferior vena cava is normal in size with greater than 50%  respiratory variability, suggesting right atrial pressure of 3 mmHg. "  Since the last visit, he has done well.  His only complaint is that when he stands from a sitting position, he gets dizzy.  He holds a wall and is ok after a few seconds.   He is going to have a knee replacement on 4/19.  Two weeks ago, walked 2.5 miles including hills without any cardiac sx.  It did cause knee pain.   Home BPs are < [id number] systolic.   He has had both COVID shots.    Past Medical History:  Diagnosis Date  . Arthritis   . Biliary dyskinesia   . Carotid artery disease ([place]) 07/01/2016  . Dyslipidemia 07/13/2017  . GERD (gastroesophageal reflux disease)   . H/O measles   . History of chicken pox   . Hypertension   . Kidney stone 03/2015  . Persistent atrial fibrillation ([place])   . Pneumonia 1992  . Tachycardia-bradycardia ([place])    a. s/p STJ dual chamber pacemaker  . Thiamine deficiency 01/19/2018  . Valvular heart disease 07/01/2016    Past Surgical History:  Procedure Laterality Date  . CHOLECYSTECTOMY N/A 11/24/2015   Procedure: LAPAROSCOPIC CHOLECYSTECTOMY;  Surgeon: Greer Pickerel, MD;  Location: [facility];  Service: General;  Laterality: N/A;  . CYSTOSCOPY  2016  . FRACTURE SURGERY  2005   left ankle  . HERNIA REPAIR     left groin, no mesh  . PERMANENT PACEMAKER INSERTION N/A 01/31/2012   SJM Accent SR RF implanted by DR Allred     Current Outpatient Medications  Medication Sig Dispense Refill  . acetaminophen (TYLENOL) 500 MG tablet Take 1,000 mg by mouth every 6 (six) hours as needed for headache.    . Biotin 5000 MCG TABS Take 5,000 mg by mouth daily.    . Calcium Carbonate-Vitamin D (CALTRATE 600+D PO) Take 600 mg by mouth daily.     . Cholecalciferol 50 MCG (2000 UT) TABS Take 2,000 Units by  mouth daily.     . Cyanocobalamin (VITAMIN B 12 PO) Take 5,000 mcg by mouth daily.     . diclofenac Sodium (VOLTAREN) 1 % GEL Apply topically daily as needed (Pain).    Marland Kitchen diltiazem (CARDIZEM CD) 240 MG 24 hr capsule TAKE ONE (1) CAPSULE EACH DAY BY MOUTH 90 capsule 3  . gabapentin (NEURONTIN) 300 MG capsule Take 1 capsule (300 mg total) by mouth 2 (two) times daily. 180 capsule 3  . irbesartan-hydrochlorothiazide (AVALIDE) 150-12.5 MG tablet Take 1/2 (one-half) tablet by mouth once daily 45 tablet 0  . loratadine (CLARITIN) 10 MG tablet Take 10 mg by mouth daily as needed for allergies.     . magnesium oxide (MAG-OX) 400 MG tablet Take 400 mg by mouth daily.    . metoprolol tartrate (LOPRESSOR) 100 MG tablet Take 1 tablet by mouth twice daily 180 tablet 0  . rivaroxaban (XARELTO) 20 MG TABS tablet Take 1 tablet (20 mg total) by mouth daily. 90 tablet 3  . rivaroxaban (XARELTO) 20 MG TABS tablet Take 20 mg by mouth daily with supper. Samples given by Dr. Charlett Blake  Patient can receive 4 bottles of Xarelto 20mg   2 times a year    . thiamine 100 MG tablet Take 100 mg by mouth daily.    . Turmeric 500 MG TABS Take 500 mg by mouth daily.      No current facility-administered medications for this visit.    Allergies:   Cardizem [diltiazem], Diltiazem hcl, and Sulfa antibiotics    Social History:  The patient  reports that he quit smoking about 34 years ago. His smoking use included pipe and cigars. He quit after 10.00 years of use. He has never used smokeless tobacco. He reports current alcohol use of about 3.0 standard drinks of alcohol per week. He reports that he does not use drugs.   Family History:  The patient's family history includes Arthritis in his mother; Atrial fibrillation in his brother and sister; Drug abuse in his mother; Heart disease in his brother, father, and sister; Heart disease (age of onset: 10) in his sister; Hypertension in his brother, mother, and son; Neuropathy in his  brother; Stroke in his father.    ROS:  Please see the history of present illness.   Otherwise, review of systems are positive for mild dizziness.   All other systems are reviewed and negative.    PHYSICAL EXAM: VS:  BP 136/88   Pulse 66   Ht 6' (1.829 m)   Wt 223 lb 6.4 oz (101.3 kg)   SpO2 98%   BMI 30.30 kg/m  , BMI Body mass index is 30.3 kg/m. GEN: Well nourished, well developed, in no acute distress  HEENT: normal  Neck: no JVD,  carotid bruits, or masses Cardiac: irregularly irregular; 2/6 holodiastolic murmur, no rubs, or gallops,no edema  Respiratory:  clear to auscultation bilaterally, normal work of breathing GI: soft, nontender, nondistended, + BS MS: no deformity or atrophy ; bilateral varicose veins Skin: warm and dry, no rash Neuro:  Strength and sensation are intact Psych: euthymic mood, full affect   EKG:   The ekg ordered today demonstrates AFib   Recent Labs: 11/18/2019: ALT 13; BUN 23; Creatinine, Ser 1.31; Hemoglobin 15.1; Platelets 157.0; Potassium 4.1; Sodium 140; TSH 2.36   Lipid Panel    Component Value Date/Time   CHOL 160 11/18/2019 0923   TRIG 99.0 11/18/2019 0923   HDL 32.70 (L) 11/18/2019 0923   CHOLHDL 5 11/18/2019 0923   VLDL 19.8 11/18/2019 0923   LDLCALC 108 (H) 11/18/2019 [id number]     Other studies Reviewed: Additional studies/ records that were reviewed today with results demonstrating: prior echo reviewed; LDL 108 in 3/21; A1C 5.6.   ASSESSMENT AND PLAN:  1. Dilated aortic root: 43 mm in 05/2019. This has been stable for several years by echo.  Will set up imaging with in 05/2020- CT would be best test if renal function will allow at the time.  Brother had an aneurysm repair.  2. Carotid disease: Mild in 2020.  3. Mitral stenosis: Moderate in 2020.  4. Hyperlipidemia: LDL target < 100. Whole food plant based diet.  Wife is a retired Marine scientist and she does the cooking.  Avoid processed foods.  5. HTN: The current medical regimen is  effective;  continue present plan and medications.  Mild orthostatic sx with standing.  If renal function declines, could add amlodipine 5 mg daily and stop ARB.  6. Anticoagulated: On Xarelto. Prior h/o AFib. 7. Preop eval: No further cardiac testing    Current medicines are reviewed at length with the patient today.  The patient concerns regarding his medicines were addressed.  The following changes have been made:  No change  Labs/ tests ordered today include:  No orders of the defined types were placed in this encounter.   Recommend 150 minutes/week of aerobic exercise Low fat, low carb, high fiber diet recommended  Disposition:   FU in 1 year   Signed, Larae Grooms, MD  12/10/2019 8:11 AM    [facility] [address], [place], [place]  [address] Phone: [phone]; Fax: [phone]

## 2019-12-10 ENCOUNTER — Ambulatory Visit: Payer: PPO | Admitting: Interventional Cardiology

## 2019-12-10 ENCOUNTER — Encounter: Payer: Self-pay | Admitting: Interventional Cardiology

## 2019-12-10 ENCOUNTER — Other Ambulatory Visit: Payer: Self-pay

## 2019-12-10 VITALS — BP 136/88 | HR 66 | Ht 72.0 in | Wt 223.4 lb

## 2019-12-10 DIAGNOSIS — I779 Disorder of arteries and arterioles, unspecified: Secondary | ICD-10-CM | POA: Diagnosis not present

## 2019-12-10 DIAGNOSIS — I712 Thoracic aortic aneurysm, without rupture, unspecified: Secondary | ICD-10-CM

## 2019-12-10 DIAGNOSIS — I059 Rheumatic mitral valve disease, unspecified: Secondary | ICD-10-CM

## 2019-12-10 DIAGNOSIS — I10 Essential (primary) hypertension: Secondary | ICD-10-CM

## 2019-12-10 DIAGNOSIS — Z01812 Encounter for preprocedural laboratory examination: Secondary | ICD-10-CM

## 2019-12-10 DIAGNOSIS — I77810 Thoracic aortic ectasia: Secondary | ICD-10-CM

## 2019-12-10 DIAGNOSIS — Z950 Presence of cardiac pacemaker: Secondary | ICD-10-CM

## 2019-12-10 NOTE — Patient Instructions (Signed)
Medication Instructions:  Your physician recommends that you continue on your current medications as directed. Please refer to the Current Medication list given to you today.  *If you need a refill on your cardiac medications before your next appointment, please call your pharmacy*   Lab Work: Your physician recommends that you return for lab work for BMET prior to CTA of the Aorta in September  If you have labs (blood work) drawn today and your tests are completely normal, you will receive your results only by: Marland Kitchen MyChart Message (if you have MyChart) OR . A paper copy in the mail If you have any lab test that is abnormal or we need to change your treatment, we will call you to review the results.   Testing/Procedures: Your physician has requested that you have CTA of the Aorta in September 2021.   Follow-Up: At [facility], you and your health needs are our priority.  As part of our continuing mission to provide you with exceptional heart care, we have created designated Provider Care Teams.  These Care Teams include your primary Cardiologist (physician) and Advanced Practice Providers (APPs -  Physician Assistants and Nurse Practitioners) who all work together to provide you with the care you need, when you need it.  We recommend signing up for the patient portal called "MyChart".  Sign up information is provided on this After Visit Summary.  MyChart is used to connect with patients for Virtual Visits (Telemedicine).  Patients are able to view lab/test results, encounter notes, upcoming appointments, etc.  Non-urgent messages can be sent to your provider as well.   To learn more about what you can do with MyChart, go to [email or web address].    Your next appointment:   October 2021  The format for your next appointment:   In Person  Provider:   You may see Larae Grooms, MD or one of the following Advanced Practice Providers on your designated Care Team:    Melina Copa,  PA-C  Ermalinda Barrios, PA-C    Other Instructions

## 2019-12-11 DIAGNOSIS — N402 Nodular prostate without lower urinary tract symptoms: Secondary | ICD-10-CM | POA: Diagnosis not present

## 2019-12-13 ENCOUNTER — Encounter (HOSPITAL_COMMUNITY): Payer: Self-pay

## 2019-12-13 NOTE — H&P (Signed)
TOTAL KNEE ADMISSION H&P  Patient is being admitted for right total knee arthroplasty.  Subjective:  Chief Complaint: Right knee pain.  HPI: Brian Davidson, 84 y.o. male has a history of pain and functional disability in the right knee due to arthritis and has failed non-surgical conservative treatments for greater than 12 weeks to include corticosteriod injections, viscosupplementation injections and activity modification. Onset of symptoms was gradual, starting >10 years ago with gradually worsening course since that time. The patient noted prior procedures on the knee to include  arthroscopy and menisectomy on the right knee.  Patient currently rates pain in the right knee at 8 out of 10 with activity. Patient has worsening of pain with activity and weight bearing, pain that interferes with activities of daily living, crepitus and joint swelling. Patient has evidence of tricompartmental bone-on-bone changes with large osteophytes by imaging studies. There is no active infection.  Patient Active Problem List   Diagnosis Date Noted  . Symptomatic bradycardia 05/31/2019  . Vertigo 07/27/2018  . Multiple closed fractures of ribs of right side 01/21/2018  . Thiamine deficiency 01/19/2018  . Peripheral neuropathy 07/13/2017  . Dyslipidemia 07/13/2017  . Preventative health care 01/11/2017  . Carotid artery disease ([place]) 07/01/2016  . Mitral valve disorder 07/01/2016  . Low back pain 12/31/2015  . Osteopenia 12/31/2015  . Allergic state 12/31/2015  . Skin lesion 12/31/2015  . History of chicken pox   . Calculi, ureter 03/30/2015  . Arthritis of knee, degenerative 05/10/2014  . Pacemaker-St.Jude 02/01/2012  . Permanent atrial fibrillation ([place])   . Hypertension   . Heart murmur   . Chronic anticoagulation     Past Medical History:  Diagnosis Date  . Arthritis   . Biliary dyskinesia   . Carotid artery disease ([place]) 07/01/2016  . Dyslipidemia 07/13/2017  . GERD (gastroesophageal  reflux disease)   . H/O measles   . History of chicken pox   . Hypertension   . Kidney stone 03/2015  . Persistent atrial fibrillation ([address])   . Pneumonia 1992  . Tachycardia-bradycardia ([place])    a. s/p STJ dual chamber pacemaker  . Thiamine deficiency 01/19/2018  . Valvular heart disease 07/01/2016    Past Surgical History:  Procedure Laterality Date  . CHOLECYSTECTOMY N/A 11/24/2015   Procedure: LAPAROSCOPIC CHOLECYSTECTOMY;  Surgeon: Greer Pickerel, MD;  Location: [facility] ORS;  Service: General;  Laterality: N/A;  . CYSTOSCOPY  2016  . FRACTURE SURGERY  2005   left ankle  . HERNIA REPAIR     left groin, no mesh  . PERMANENT PACEMAKER INSERTION N/A 01/31/2012   SJM Accent SR RF implanted by DR Allred    Prior to Admission medications   Medication Sig Start Date End Date Taking? Authorizing Provider  acetaminophen (TYLENOL) 500 MG tablet Take 1,000 mg by mouth every 6 (six) hours as needed for headache.   Yes [provider]  Biotin 5000 MCG TABS Take 5,000 mg by mouth daily.   Yes [provider]  Calcium Carbonate-Vitamin D (CALTRATE 600+D PO) Take 600 mg by mouth daily.    Yes [provider]  Cholecalciferol 50 MCG (2000 UT) TABS Take 2,000 Units by mouth daily.    Yes [provider]  Cyanocobalamin (VITAMIN B 12 PO) Take 5,000 mcg by mouth daily.    Yes [provider]  diclofenac Sodium (VOLTAREN) 1 % GEL Apply topically daily as needed (Pain).   Yes [provider]  diltiazem (CARDIZEM CD) 240 MG 24 hr  capsule TAKE ONE (1) CAPSULE EACH DAY BY MOUTH 10/01/19  Yes Mosie Lukes, MD  gabapentin (NEURONTIN) 300 MG capsule Take 1 capsule (300 mg total) by mouth 2 (two) times daily. 05/21/19  Yes Mosie Lukes, MD  irbesartan-hydrochlorothiazide (AVALIDE) 150-12.5 MG tablet Take 1/2 (one-half) tablet by mouth once daily 10/22/19  Yes Mosie Lukes, MD  loratadine (CLARITIN) 10 MG tablet Take 10 mg by mouth daily as needed for  allergies.    Yes [provider]  magnesium oxide (MAG-OX) 400 MG tablet Take 400 mg by mouth daily.   Yes [provider]  metoprolol tartrate (LOPRESSOR) 100 MG tablet Take 1 tablet by mouth twice daily 09/03/19  Yes Mosie Lukes, MD  rivaroxaban (XARELTO) 20 MG TABS tablet Take 1 tablet (20 mg total) by mouth daily. 03/07/19  Yes Saguier, Percell Miller, PA-C  rivaroxaban (XARELTO) 20 MG TABS tablet Take 20 mg by mouth daily with supper. Samples given by Dr. Charlett Blake  Patient can receive 4 bottles of Xarelto 20mg   2 times a year   Yes Mosie Lukes, MD  thiamine 100 MG tablet Take 100 mg by mouth daily.   Yes [provider]  Turmeric 500 MG TABS Take 500 mg by mouth daily.    Yes [provider]    Allergies  Allergen Reactions  . Cardizem [Diltiazem]     Rash from Yellow dye in generic capsule. Can take different brand. Takes diltiazem - his allergy is to Cardizem  . Diltiazem Hcl     Rash from red dye in generic capsule. Can take different brand. Takes diltiazem - his allergy is to Cardizem Rash from red dye in generic capsule. Can take different brand. Takes diltiazem - his allergy is to Cardizem  . Sulfa Antibiotics     Unknown childhood reaction    Social History   Socioeconomic History  . Marital status: Married    Spouse name: [name]  . Number of children: 1  . Years of education: Not on file  . Highest education level: Not on file  Occupational History  . Occupation: Retired  . 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 Smoking status: Former Smoker    Years: 10.00    Types: Pipe, Cigars    Quit date: 11/18/1985    Years since quitting: 34.0  . Smokeless tobacco: Never Used  Substance and Sexual Activity  . Alcohol use: Yes    Alcohol/week: 3.0 standard drinks    Types: 3 Cans of beer per week    Comment: occ  . Drug use: No  . Sexual activity: Never  Other Topics Concern  . Not on file  Social History  Narrative   Lives with wife, still works part-time as a Geophysicist/field seismologist. He is active around the house and yard...   Parents are deceased and did not have cardiac issues but he has 2 siblings with atrial fibrillation and a sister-in-law  has a pacemaker.   Social Determinants of Health   Financial Resource Strain: Low Risk   . Difficulty of Paying Living Expenses: Not hard at all  Food Insecurity: No Food Insecurity  . Worried About Charity fundraiser in the Last Year: Never true  . Ran Out of Food in the Last Year: Never true  Transportation Needs: No Transportation Needs  . Lack of Transportation (Medical): No  . Lack of Transportation (Non-Medical): No  Physical Activity:   . Days of Exercise per Week:   .  Minutes of Exercise per Session:   Stress:   . Feeling of Stress :   Social Connections:   . Frequency of Communication with Friends and Family:   . Frequency of Social Gatherings with Friends and Family:   . Attends Religious Services:   . Active Member of Clubs or Organizations:   . Attends Archivist Meetings:   Marland Kitchen Marital Status:   Intimate Partner Violence:   . Fear of Current or Ex-Partner:   . Emotionally Abused:   Marland Kitchen Physically Abused:   . Sexually Abused:       Tobacco Use: Medium Risk  . Smoking Tobacco Use: Former Smoker  . Smokeless Tobacco Use: Never Used   Social History   Substance and Sexual Activity  Alcohol Use Yes  . Alcohol/week: 3.0 standard drinks  . Types: 3 Cans of beer per week   Comment: occ    Family History  Problem Relation Age of Onset  . Hypertension Mother   . Arthritis Mother   . Drug abuse Mother   . Stroke Father   . Heart disease Father   . Heart disease Sister 45  . Heart disease Brother   . Hypertension Son   . Heart disease Sister   . Neuropathy Brother   . Hypertension Brother   . Atrial fibrillation Brother   . Atrial fibrillation Sister   . Colon cancer Neg Hx     Review of Systems  Constitutional:  Negative for chills and fever.  HENT: Negative for congestion, sore throat and tinnitus.   Eyes: Negative for double vision, photophobia and pain.  Respiratory: Negative for cough, shortness of breath and wheezing.   Cardiovascular: Negative for chest pain, palpitations and orthopnea.  Gastrointestinal: Negative for heartburn, nausea and vomiting.  Genitourinary: Negative for dysuria, frequency and urgency.  Musculoskeletal: Positive for joint pain.  Neurological: Negative for dizziness, weakness and headaches.    Objective:  Physical Exam: Well nourished and well developed.  General: Alert and oriented x3, cooperative and pleasant, no acute distress.  Head: normocephalic, atraumatic, neck supple.  Eyes: EOMI.  Respiratory: breath sounds clear in all fields, no wheezing, rales, or rhonchi. Cardiovascular: Regular rate and rhythm, no murmurs, gallops or rubs.  Abdomen: non-tender to palpation and soft, normoactive bowel sounds. Musculoskeletal:  Right knee Large effusion.  Very large varicose vein coming across the anterior aspect of the knee. Lateral greater than medial joint line tenderness. Range of Motion: Range of motion is from 5 to 120 degrees. No instability.  Calves soft and nontender. Motor function intact in LE. Strength 5/5 LE bilaterally. Neuro: Distal pulses 2+. Sensation to light touch intact in LE.  Vital signs in last 24 hours:  Blood pressure: 114/70 mmHg  Imaging Review Plain radiographs demonstrate severe degenerative joint disease of the right knee. The overall alignment is neutral. The bone quality appears to be adequate for age and reported activity level.  Assessment/Plan:  End stage arthritis, right knee   The patient history, physical examination, clinical judgment of the provider and imaging studies are consistent with end stage degenerative joint disease of the right knee and total knee arthroplasty is deemed medically necessary. The treatment  options including medical management, injection therapy arthroscopy and arthroplasty were discussed at length. The risks and benefits of total knee arthroplasty were presented and reviewed. The risks due to aseptic loosening, infection, stiffness, patella tracking problems, thromboembolic complications and other imponderables were discussed. The patient acknowledged the explanation, agreed to proceed with the  plan and consent was signed. Patient is being admitted for inpatient treatment for surgery, pain control, PT, OT, prophylactic antibiotics, VTE prophylaxis, progressive ambulation and ADLs and discharge planning. The patient is planning to be discharged home.  Anticipated LOS equal to or greater than 2 midnights due to - Age 13 and older with one or more of the following:  - Obesity  - Expected need for hospital services (PT, OT, Nursing) required for safe  discharge  - Anticipated need for postoperative skilled nursing care or inpatient rehab  - Active co-morbidities: Coronary Artery Disease and Cardiac Arrhythmia OR   - Unanticipated findings during/Post Surgery: None  - Patient is a high risk of re-admission due to: None  Therapy Plans: Outpatient therapy at [facility]) Disposition: Home with wife Planned DVT Prophylaxis: Xarelto 20 mg QD DME Needed: None PCP: [name], MD Cardiologist: Larae Grooms, MD (appointment on 04/06) Electrophysiologist: Thompson Grayer, MD (clearance provided) TXA: IV Allergies: Sulfa Anesthesia Concerns: None BMI: 30.4 Other: Hx of rate-controlled atrial fibrillation with cardiac pacemaker   - Patient was instructed on what medications to stop prior to surgery. - Follow-up visit in 2 weeks with Dr. Wynelle Link - Begin physical therapy following surgery - Pre-operative lab work as pre-surgical testing - Prescriptions will be provided in hospital at time of discharge  Theresa Duty, PA-C Orthopedic Surgery [facility] [place]  [facility]

## 2019-12-13 NOTE — Progress Notes (Signed)
PCP - Christella Noa clearance on chart 12-02-29  Cardiologist - lov 05-31-19 Oda Kilts PA Thompson Grayer,, Clearance on chart. LOV Dr. Irish Lack   12-10-19 epic  clearance  Chest x-ray -  EKG - 12-10-19 epic Stress Test -  ECHO - 05-2019  epic Cardiac Cath -  Last device check 12-03-19 epic  Sleep Study -  CPAP -   Fasting Blood Sugar -  Checks Blood Sugar _____ times a day  Blood Thinner Instructions:Xarelto hold 3 days prior Aspirin Instructions: Last Dose:last dose 4-15  Anesthesia review: pacemaker, Cardiac hx with clearance, device orders on chart and epic, PT  17.6, PTT 40  Patient denies shortness of breath, fever, cough and chest pain at PAT appointment   NONE       Patient verbalized understanding of instructions that were given to them at the PAT appointment. Patient was also instructed that they will need to review over the PAT instructions again at home before surgery.

## 2019-12-16 ENCOUNTER — Other Ambulatory Visit: Payer: Self-pay

## 2019-12-16 ENCOUNTER — Encounter (HOSPITAL_COMMUNITY)
Admission: RE | Admit: 2019-12-16 | Discharge: 2019-12-16 | Disposition: A | Discharge status: Home / Self Care | Payer: PPO | Source: Ambulatory Visit | Attending: Orthopedic Surgery | Admitting: Orthopedic Surgery

## 2019-12-16 ENCOUNTER — Encounter (HOSPITAL_COMMUNITY): Payer: Self-pay

## 2019-12-16 DIAGNOSIS — Z01812 Encounter for preprocedural laboratory examination: Secondary | ICD-10-CM | POA: Diagnosis not present

## 2019-12-16 HISTORY — DX: Cardiac arrhythmia, unspecified: I49.9

## 2019-12-16 HISTORY — DX: Myoneural disorder, unspecified: G70.9

## 2019-12-16 HISTORY — DX: Personal history of urinary calculi: Z87.442

## 2019-12-16 HISTORY — DX: Presence of cardiac pacemaker: Z95.0

## 2019-12-16 LAB — COMPREHENSIVE METABOLIC PANEL
ALT: 17 U/L (ref 0–44)
AST: 21 U/L (ref 15–41)
Albumin: 3.9 g/dL (ref 3.5–5.0)
Alkaline Phosphatase: 64 U/L (ref 38–126)
Anion gap: 9 (ref 5–15)
BUN: 22 mg/dL (ref 8–23)
CO2: 26 mmol/L (ref 22–32)
Calcium: 9.4 mg/dL (ref 8.9–10.3)
Chloride: 104 mmol/L (ref 98–111)
Creatinine, Ser: 1.26 mg/dL — ABNORMAL HIGH (ref 0.61–1.24)
GFR calc Af Amer: >60 mL/min (ref >60)
GFR calc non Af Amer: 53 mL/min — ABNORMAL LOW (ref >60)
Glucose, Bld: 96 mg/dL (ref 70–99)
Potassium: 4.4 mmol/L (ref 3.5–5.1)
Sodium: 139 mmol/L (ref 135–145)
Total Bilirubin: 0.7 mg/dL (ref 0.3–1.2)
Total Protein: 7 g/dL (ref 6.5–8.1)

## 2019-12-16 LAB — CBC
HCT: 45.8 % (ref 39.0–52.0)
Hemoglobin: 14.9 g/dL (ref 13.0–17.0)
MCH: 29.8 pg (ref 26.0–34.0)
MCHC: 32.5 g/dL (ref 30.0–36.0)
MCV: 91.6 fL (ref 80.0–100.0)
Platelets: 164 10*3/uL (ref 150–400)
RBC: 5 MIL/uL (ref 4.22–5.81)
RDW: 13.5 % (ref 11.5–15.5)
WBC: 6.1 10*3/uL (ref 4.0–10.5)
nRBC: 0 % (ref 0.0–0.2)

## 2019-12-16 LAB — PROTIME-INR
INR: 1.5 — ABNORMAL HIGH (ref 0.8–1.2)
Prothrombin Time: 17.6 seconds — ABNORMAL HIGH (ref 11.4–15.2)

## 2019-12-16 LAB — APTT: aPTT: 40 seconds — ABNORMAL HIGH (ref 24–36)

## 2019-12-16 LAB — ABO/RH: ABO/RH(D): A POS

## 2019-12-16 LAB — SURGICAL PCR SCREEN
MRSA, PCR: NEGATIVE
Staphylococcus aureus: NEGATIVE

## 2019-12-16 NOTE — Progress Notes (Unsigned)
[facility] DEVICE PROGRAMMING  Patient Information: Name:  Brian Davidson  DOB:  [birth year]  MRN:  [id number]  {TIP - You do not have to delete this tip  -  Copy the info from the staff message sent by the PAT staff  then press F2 here and paste the information using CTL - V on the next line :[id number]   Device Information:  Clinic EP Physician:  Thompson Grayer, MD   Device Type:  Pacemaker Manufacturer and Phone #:  St. Jude/Abbott: [phone] Pacemaker Dependent?:  No. Date of Last Device Check:  12/03/19 Normal Device Function?:  Yes.    Electrophysiologist's Recommendations:   Have magnet available.  Provide continuous ECG monitoring when magnet is used or reprogramming is to be performed.   Procedure should not interfere with device function.  No device programming or magnet placement needed.  Per Device Clinic Standing Orders, Simone Curia, RN  9:43 AM 12/16/2019

## 2019-12-16 NOTE — Patient Instructions (Addendum)
DUE TO COVID-19 ONLY ONE VISITOR IS ALLOWED TO COME WITH YOU AND STAY IN THE WAITING ROOM ONLY DURING PRE OP AND PROCEDURE DAY OF SURGERY. TWO  VISITORS  MAY VISIT WITH YOU AFTER SURGERY IN YOUR PRIVATE ROOM DURING VISITING HOURS ONLY!  10-a 8p  YOU NEED TO HAVE A COVID 19 TEST ON__4-15-21_____ @__1 : 55 pm_____, THIS TEST MUST BE DONE BEFORE SURGERY, COME  [address], [place] , [address].  ([place]) ONCE YOUR COVID TEST IS COMPLETED, PLEASE BEGIN THE QUARANTINE INSTRUCTIONS AS OUTLINED IN YOUR HANDOUT.                Brian Davidson  12/16/2019   Your procedure is scheduled on: 12-23-19   Report to [facility] Main  Entrance   Report to   Short stay at     0530  AM     Call this number if you have problems the morning of surgery [phone]    Remember: NO SOLID FOOD AFTER MIDNIGHT THE NIGHT PRIOR TO SURGERY. NOTHING BY MOUTH EXCEPT CLEAR LIQUIDS UNTIL 0415 am . PLEASE FINISH ENSURE DRINK PER SURGEON ORDER  WHICH NEEDS TO BE COMPLETED AT    [place] am then nothing by mouth .    CLEAR LIQUID DIET   Foods Allowed                                                                                 Foods Excluded  Coffee and tea, regular and decaf   No creamer                          liquids that you cannot  Plain Jell-O any favor except red or purple                                           see through such as: Fruit ices (not with fruit pulp)                                                        milk, soups, orange juice  Iced Popsicles                                                        All solid food Carbonated beverages, regular and diet                                    Cranberry, grape and apple juices Sports drinks like Gatorade Lightly seasoned clear broth or consume(fat free) Sugar, honey syrup  _____________________________________________________________________    BRUSH YOUR TEETH MORNING OF SURGERY AND  RINSE YOUR MOUTH OUT, NO  CHEWING GUM CANDY OR MINTS.     Take these medicines the morning of surgery with A SIP OF WATER: metoprolol, gabapentin, dilitiazem                                 You may not have any metal on your body including hair pins and              piercings  Do not wear jewelry, lotions, powders or perfumes, deodorant                        Men may shave face and neck.   Do not bring valuables to the hospital. [place].  Contacts, dentures or bridgework may not be worn into surgery.                Please read over the following fact sheets you were given: _____________________________________________________________________           [facility] - Preparing for Surgery Before surgery, you can play an important role.  Because skin is not sterile, your skin needs to be as free of germs as possible.  You can reduce the number of germs on your skin by washing with CHG (chlorahexidine gluconate) soap before surgery.  CHG is an antiseptic cleaner which kills germs and bonds with the skin to continue killing germs even after washing. Please DO NOT use if you have an allergy to CHG or antibacterial soaps.  If your skin becomes reddened/irritated stop using the CHG and inform your nurse when you arrive at Short Stay. Do not shave (including legs and underarms) for at least 48 hours prior to the first CHG shower.  You may shave your face/neck. Please follow these instructions carefully:  1.  Shower with CHG Soap the night before surgery and the  morning of Surgery.  2.  If you choose to wash your hair, wash your hair first as usual with your  normal  shampoo.  3.  After you shampoo, rinse your hair and body thoroughly to remove the  shampoo.                           4.  Use CHG as you would any other liquid soap.  You can apply chg directly  to the skin and wash                       Gently with a scrungie or clean washcloth.  5.  Apply the CHG Soap to  your body ONLY FROM THE NECK DOWN.   Do not use on face/ open                           Wound or open sores. Avoid contact with eyes, ears mouth and genitals (private parts).                       Wash face,  Genitals (private parts) with your normal soap.             6.  Wash thoroughly, paying special attention to the area where your surgery  will be performed.  7.  Thoroughly rinse your body with warm water from the neck down.  8.  DO NOT shower/wash with your normal soap after using and rinsing off  the CHG Soap.                9.  Pat yourself dry with a clean towel.            10.  Wear clean pajamas.            11.  Place clean sheets on your bed the night of your first shower and do not  sleep with pets. Day of Surgery : Do not apply any lotions/deodorants the morning of surgery.  Please wear clean clothes to the hospital/surgery center.  FAILURE TO FOLLOW THESE INSTRUCTIONS MAY RESULT IN THE CANCELLATION OF YOUR SURGERY PATIENT SIGNATURE_________________________________  NURSE SIGNATURE__________________________________  ________________________________________________________________________   Brian Davidson  An incentive spirometer is a tool that can help keep your lungs clear and active. This tool measures how well you are filling your lungs with each breath. Taking long deep breaths may help reverse or decrease the chance of developing breathing (pulmonary) problems (especially infection) following:  A long period of time when you are unable to move or be active. BEFORE THE PROCEDURE   If the spirometer includes an indicator to show your best effort, your nurse or respiratory therapist will set it to a desired goal.  If possible, sit up straight or lean slightly forward. Try not to slouch.  Hold the incentive spirometer in an upright position. INSTRUCTIONS FOR USE  1. Sit on the edge of your bed if possible, or sit up as far as you can in bed or on a  chair. 2. Hold the incentive spirometer in an upright position. 3. Breathe out normally. 4. Place the mouthpiece in your mouth and seal your lips tightly around it. 5. Breathe in slowly and as deeply as possible, raising the piston or the ball toward the top of the column. 6. Hold your breath for 3-5 seconds or for as long as possible. Allow the piston or ball to fall to the bottom of the column. 7. Remove the mouthpiece from your mouth and breathe out normally. 8. Rest for a few seconds and repeat Steps 1 through 7 at least 10 times every 1-2 hours when you are awake. Take your time and take a few normal breaths between deep breaths. 9. The spirometer may include an indicator to show your best effort. Use the indicator as a goal to work toward during each repetition. 10. After each set of 10 deep breaths, practice coughing to be sure your lungs are clear. If you have an incision (the cut made at the time of surgery), support your incision when coughing by placing a pillow or rolled up towels firmly against it. Once you are able to get out of bed, walk around indoors and cough well. You may stop using the incentive spirometer when instructed by your caregiver.  RISKS AND COMPLICATIONS  Take your time so you do not get dizzy or light-headed.  If you are in pain, you may need to take or ask for pain medication before doing incentive spirometry. It is harder to take a deep breath if you are having pain. AFTER USE  Rest and breathe slowly and easily.  It can be helpful to keep track of a log of your progress. Your caregiver can provide you with a simple table to help with this. If you are using  the spirometer at home, follow these instructions: [place] IF:   You are having difficultly using the spirometer.  You have trouble using the spirometer as often as instructed.  Your pain medication is not giving enough relief while using the spirometer.  You develop fever of 100.5 F  (38.1 C) or higher. SEEK IMMEDIATE MEDICAL CARE IF:   You cough up bloody sputum that had not been present before.  You develop fever of 102 F (38.9 C) or greater.  You develop worsening pain at or near the incision site. MAKE SURE YOU:   Understand these instructions.  Will watch your condition.  Will get help right away if you are not doing well or get worse. Document Released: 01/02/2007 Document Revised: 11/14/2011 Document Reviewed: 03/05/2007 ExitCare Patient Information 2014 ExitCare, [place].   ________________________________________________________________________  WHAT IS A BLOOD TRANSFUSION? Blood Transfusion Information  A transfusion is the replacement of blood or some of its parts. Blood is made up of multiple cells which provide different functions.  Red blood cells carry oxygen and are used for blood loss replacement.  White blood cells fight against infection.  Platelets control bleeding.  Plasma helps clot blood.  Other blood products are available for specialized needs, such as hemophilia or other clotting disorders. BEFORE THE TRANSFUSION  Who gives blood for transfusions?   Healthy volunteers who are fully evaluated to make sure their blood is safe. This is blood bank blood. Transfusion therapy is the safest it has ever been in the practice of medicine. Before blood is taken from a donor, a complete history is taken to make sure that person has no history of diseases nor engages in risky social behavior (examples are intravenous drug use or sexual activity with multiple partners). The donor's travel history is screened to minimize risk of transmitting infections, such as malaria. The donated blood is tested for signs of infectious diseases, such as HIV and hepatitis. The blood is then tested to be sure it is compatible with you in order to minimize the chance of a transfusion reaction. If you or a relative donates blood, this is often done in anticipation  of surgery and is not appropriate for emergency situations. It takes many days to process the donated blood. RISKS AND COMPLICATIONS Although transfusion therapy is very safe and saves many lives, the main dangers of transfusion include:   Getting an infectious disease.  Developing a transfusion reaction. This is an allergic reaction to something in the blood you were given. Every precaution is taken to prevent this. The decision to have a blood transfusion has been considered carefully by your caregiver before blood is given. Blood is not given unless the benefits outweigh the risks. AFTER THE TRANSFUSION  Right after receiving a blood transfusion, you will usually feel much better and more energetic. This is especially true if your red blood cells have gotten low (anemic). The transfusion raises the level of the red blood cells which carry oxygen, and this usually causes an energy increase.  The nurse administering the transfusion will monitor you carefully for complications. HOME CARE INSTRUCTIONS  No special instructions are needed after a transfusion. You may find your energy is better. Speak with your caregiver about any limitations on activity for underlying diseases you may have. SEEK MEDICAL CARE IF:   Your condition is not improving after your transfusion.  You develop redness or irritation at the intravenous (IV) site. SEEK IMMEDIATE MEDICAL CARE IF:  Any of the following symptoms occur  over the next 12 hours:  Shaking chills.  You have a temperature by mouth above 102 F (38.9 C), not controlled by medicine.  Chest, back, or muscle pain.  People around you feel you are not acting correctly or are confused.  Shortness of breath or difficulty breathing.  Dizziness and fainting.  You get a rash or develop hives.  You have a decrease in urine output.  Your urine turns a dark color or changes to pink, red, or brown. Any of the following symptoms occur over the next 10  days:  You have a temperature by mouth above 102 F (38.9 C), not controlled by medicine.  Shortness of breath.  Weakness after normal activity.  The white part of the eye turns yellow (jaundice).  You have a decrease in the amount of urine or are urinating less often.  Your urine turns a dark color or changes to pink, red, or brown. Document Released: 08/19/2000 Document Revised: 11/14/2011 Document Reviewed: 04/07/2008 [facility] Patient Information 2014 [place], [place].  _______________________________________________________________________

## 2019-12-18 DIAGNOSIS — R972 Elevated prostate specific antigen [PSA]: Secondary | ICD-10-CM | POA: Diagnosis not present

## 2019-12-18 DIAGNOSIS — N402 Nodular prostate without lower urinary tract symptoms: Secondary | ICD-10-CM | POA: Diagnosis not present

## 2019-12-18 NOTE — Progress Notes (Signed)
Anesthesia Chart Review   Case: [id number] Date/Time: 12/23/19 0700   Procedure: TOTAL KNEE ARTHROPLASTY (Right Knee) - 71min   Anesthesia type: Choice   Pre-op diagnosis: right knee osteoarthritis   Location: [facility] ROOM 09 / [facility]   Surgeons: Gaynelle Arabian, MD      DISCUSSION:84 y.o. former smoker (11/18/85) with h/o GERD, HTN, a-fib (on Xarelto, will hold 3 days prior to procedure), tachy/brady syndrome s/p pacemaker (pacemaker orders on chart and under encounters, documentation 12/16/19), moderate mitral stenosis, right knee OA scheduled for above procedure 12/23/19 with Dr. Gaynelle Arabian.   Pt last seen by cardiology 12/10/2019.  Per OV note pt stable, no further cardiac testing recommended preoperatively.    Clearance from EP office 11/27/19.  Per Daune Perch, NP, "[name] was last seen on 05/31/2019 by Oda Kilts, PA.  Since that day, JAVEN BALDASSARE has done well. He is active, walking 2-2.5 miles per day and doing yard work. He has no exertional chest discomfort or shortness of breath. His only limitation is due to knee pain. He has no prior history of MI or CHF. He has a [employer] pacemaker and rate controlled atrial fibrillation. He had a recent echo in 05/2019 that showed normal EF, stable moderate ascending aortic dilitation and moderated Mitral stenosis.  According to the the RCRI he is a Class I risk with 0.4% risk of major cardiac event perioperatively. Therefore, based on ACC/AHA guidelines, the patient would be at acceptable risk for the planned procedure without further cardiovascular testing."  Clearance received from PCP as well, on chart.   Anticipate pt can proceed with planned procedure barring acute status change.   VS: Ht 6' (1.829 m)   Wt 100.7 kg   BMI 30.11 kg/m   PROVIDERS: Mosie Lukes, MD is PCP   Larae Grooms, MD is Cardiologist   Thompson Grayer, MD is Electrophysiologist  LABS: Labs reviewed: Acceptable for surgery. (all labs ordered are  listed, but only abnormal results are displayed)  Labs Reviewed - No data to display   IMAGES: VAS US Carotid 05/29/2019 Summary:  Right Carotid: Velocities in the right ICA are consistent with a 1-39%  stenosis.   Left Carotid: Velocities in the left ICA are consistent with a 1-39%  stenosis.   EKG: 12/10/2019 Rate 66 bpm  Afib Rate controlled  CV: Echo 05/29/2019 IMPRESSIONS    1. Left ventricular ejection fraction, by visual estimation, is 60 to  65%. The left ventricle has normal function. Normal left ventricular size.  There is no left ventricular hypertrophy.  2. Left ventricular diastolic function could not be evaluated pattern of  LV diastolic filling.  3. Global right ventricle has normal systolic function.The right  ventricular size is normal. No increase in right ventricular wall  thickness.  4. Left atrial size was moderately dilated.  5. Right atrial size was normal.  6. The mitral valve is normal in structure. No evidence of mitral valve  regurgitation. Moderate mitral stenosis.  7. The tricuspid valve is normal in structure. Tricuspid valve  regurgitation is mild.  8. The aortic valve is normal in structure. Aortic valve regurgitation is  mild by color flow Doppler. Mild aortic valve sclerosis without stenosis.  9. The pulmonic valve was normal in structure. Pulmonic valve  regurgitation is not visualized by color flow Doppler.  10. There is moderate dilatation of the ascending aorta measuring 43 mm.  11. Normal pulmonary artery systolic pressure.  12. The inferior vena cava  is normal in size with greater than 50%  respiratory variability, suggesting right atrial pressure of 3 mmHg.  Past Medical History:  Diagnosis Date  . Arthritis   . Biliary dyskinesia   . Carotid artery disease ([place]) 07/01/2016  . Dyslipidemia 07/13/2017  . Dysrhythmia    a fib  . GERD (gastroesophageal reflux disease)    occasional  . H/O measles   . History of  chicken pox   . History of kidney stones   . Hypertension   . Neuromuscular disorder (H[facility])    neuropathy feet   . Persistent atrial fibrillation ([place])   . Pneumonia 1992  . Presence of permanent cardiac pacemaker   . Tachycardia-bradycardia ([place])    a. s/p STJ dual chamber pacemaker  . Thiamine deficiency 01/19/2018  . Valvular heart disease 07/01/2016    Past Surgical History:  Procedure Laterality Date  . CHOLECYSTECTOMY N/A 11/24/2015   Procedure: LAPAROSCOPIC CHOLECYSTECTOMY;  Surgeon: Greer Pickerel, MD;  Location: [facility];  Service: General;  Laterality: N/A;  . CYSTOSCOPY  2016  . EYE SURGERY     bil cataract  . FRACTURE SURGERY  2005   left ankle  . HERNIA REPAIR     left groin, no mesh  . PERMANENT PACEMAKER INSERTION N/A 01/31/2012   SJM Accent SR RF implanted by DR Allred    MEDICATIONS: . acetaminophen (TYLENOL) 500 MG tablet  . Biotin 5000 MCG TABS  . Calcium Carbonate-Vitamin D (CALTRATE 600+D PO)  . Cholecalciferol 50 MCG (2000 UT) TABS  . Cyanocobalamin (VITAMIN B 12 PO)  . diclofenac Sodium (VOLTAREN) 1 % GEL  . diltiazem (CARDIZEM CD) 240 MG 24 hr capsule  . gabapentin (NEURONTIN) 300 MG capsule  . irbesartan-hydrochlorothiazide (AVALIDE) 150-12.5 MG tablet  . loratadine (CLARITIN) 10 MG tablet  . magnesium oxide (MAG-OX) 400 MG tablet  . metoprolol tartrate (LOPRESSOR) 100 MG tablet  . rivaroxaban (XARELTO) 20 MG TABS tablet  . rivaroxaban (XARELTO) 20 MG TABS tablet  . thiamine 100 MG tablet  . Turmeric 500 MG TABS   No current facility-administered medications for this encounter.   Maia Plan [facility] Pre-Surgical Testing [phone] 12/18/19  2:17 PM

## 2019-12-19 ENCOUNTER — Other Ambulatory Visit (HOSPITAL_COMMUNITY)
Admission: RE | Admit: 2019-12-19 | Discharge: 2019-12-19 | Disposition: A | Discharge status: Home / Self Care | Payer: PPO | Source: Ambulatory Visit | Attending: Orthopedic Surgery | Admitting: Orthopedic Surgery

## 2019-12-19 DIAGNOSIS — Z01812 Encounter for preprocedural laboratory examination: Secondary | ICD-10-CM | POA: Insufficient documentation

## 2019-12-19 DIAGNOSIS — Z20822 Contact with and (suspected) exposure to covid-19: Secondary | ICD-10-CM | POA: Diagnosis not present

## 2019-12-19 LAB — SARS CORONAVIRUS 2 (TAT 6-24 HRS): SARS Coronavirus 2: NEGATIVE

## 2019-12-20 DIAGNOSIS — L89892 Pressure ulcer of other site, stage 2: Secondary | ICD-10-CM | POA: Diagnosis not present

## 2019-12-20 DIAGNOSIS — Z5181 Encounter for therapeutic drug level monitoring: Secondary | ICD-10-CM | POA: Diagnosis not present

## 2019-12-20 DIAGNOSIS — L03115 Cellulitis of right lower limb: Secondary | ICD-10-CM | POA: Diagnosis not present

## 2019-12-20 NOTE — Anesthesia Preprocedure Evaluation (Addendum)
Anesthesia Evaluation  Patient identified by MRN, date of birth, ID band Patient awake    Reviewed: Allergy & Precautions, NPO status , Patient's Chart, lab work & pertinent test results  Airway Mallampati: II  TM Distance: >3 FB Neck ROM: Full    Dental no notable dental hx.    Pulmonary neg pulmonary ROS, former smoker,    Pulmonary exam normal breath sounds clear to auscultation       Cardiovascular hypertension, Pt. on medications and Pt. on home beta blockers + Peripheral Vascular Disease  Normal cardiovascular exam+ dysrhythmias Atrial Fibrillation + pacemaker  Rhythm:Regular Rate:Normal     Neuro/Psych negative neurological ROS  negative psych ROS   GI/Hepatic negative GI ROS, Neg liver ROS,   Endo/Other  negative endocrine ROS  Renal/GU negative Renal ROS  negative genitourinary   Musculoskeletal  (+) Arthritis , Osteoarthritis,    Abdominal   Peds negative pediatric ROS (+)  Hematology negative hematology ROS (+)   Anesthesia Other Findings   Reproductive/Obstetrics negative OB ROS                            Anesthesia Physical Anesthesia Plan  ASA: III  Anesthesia Plan: General   Post-op Pain Management:  Regional for Post-op pain   Induction: Intravenous  PONV Risk Score and Plan: 2 and Ondansetron and Dexamethasone  Airway Management Planned: LMA  Additional Equipment:   Intra-op Plan:   Post-operative Plan: Extubation in OR  Informed Consent: I have reviewed the patients History and Physical, chart, labs and discussed the procedure including the risks, benefits and alternatives for the proposed anesthesia with the patient or authorized representative who has indicated his/her understanding and acceptance.     Dental advisory given  Plan Discussed with: CRNA and Surgeon  Anesthesia Plan Comments:        Anesthesia Quick Evaluation

## 2019-12-22 MED ORDER — BUPIVACAINE LIPOSOME 1.3 % IJ SUSP
20.0000 mL | Freq: Once | INTRAMUSCULAR | Status: DC
  Filled 2019-12-22: qty 20

## 2019-12-23 ENCOUNTER — Inpatient Hospital Stay (HOSPITAL_COMMUNITY): Payer: PPO | Admitting: Anesthesiology

## 2019-12-23 ENCOUNTER — Encounter (HOSPITAL_COMMUNITY)
Admission: RE | Disposition: A | Discharge status: Home / Self Care | Payer: Self-pay | Source: Home / Self Care | Attending: Orthopedic Surgery

## 2019-12-23 ENCOUNTER — Inpatient Hospital Stay (HOSPITAL_COMMUNITY): Payer: PPO | Admitting: Physician Assistant

## 2019-12-23 ENCOUNTER — Encounter (HOSPITAL_COMMUNITY): Payer: Self-pay | Admitting: Orthopedic Surgery

## 2019-12-23 ENCOUNTER — Inpatient Hospital Stay (HOSPITAL_COMMUNITY)
Admission: RE | Admit: 2019-12-23 | Discharge: 2019-12-24 | DRG: 470 | Disposition: A | Discharge status: Home / Self Care | Payer: PPO | Attending: Orthopedic Surgery | Admitting: Orthopedic Surgery

## 2019-12-23 ENCOUNTER — Other Ambulatory Visit: Payer: Self-pay

## 2019-12-23 DIAGNOSIS — E785 Hyperlipidemia, unspecified: Secondary | ICD-10-CM | POA: Diagnosis present

## 2019-12-23 DIAGNOSIS — G629 Polyneuropathy, unspecified: Secondary | ICD-10-CM | POA: Diagnosis not present

## 2019-12-23 DIAGNOSIS — Z950 Presence of cardiac pacemaker: Secondary | ICD-10-CM

## 2019-12-23 DIAGNOSIS — Z813 Family history of other psychoactive substance abuse and dependence: Secondary | ICD-10-CM

## 2019-12-23 DIAGNOSIS — G8918 Other acute postprocedural pain: Secondary | ICD-10-CM | POA: Diagnosis not present

## 2019-12-23 DIAGNOSIS — I10 Essential (primary) hypertension: Secondary | ICD-10-CM | POA: Diagnosis not present

## 2019-12-23 DIAGNOSIS — Z882 Allergy status to sulfonamides status: Secondary | ICD-10-CM | POA: Diagnosis not present

## 2019-12-23 DIAGNOSIS — M8580 Other specified disorders of bone density and structure, unspecified site: Secondary | ICD-10-CM | POA: Diagnosis present

## 2019-12-23 DIAGNOSIS — E669 Obesity, unspecified: Secondary | ICD-10-CM | POA: Diagnosis not present

## 2019-12-23 DIAGNOSIS — Z6830 Body mass index (BMI) 30.0-30.9, adult: Secondary | ICD-10-CM

## 2019-12-23 DIAGNOSIS — I4821 Permanent atrial fibrillation: Secondary | ICD-10-CM | POA: Diagnosis present

## 2019-12-23 DIAGNOSIS — M1711 Unilateral primary osteoarthritis, right knee: Principal | ICD-10-CM | POA: Diagnosis present

## 2019-12-23 DIAGNOSIS — Z87891 Personal history of nicotine dependence: Secondary | ICD-10-CM | POA: Diagnosis not present

## 2019-12-23 DIAGNOSIS — Z823 Family history of stroke: Secondary | ICD-10-CM

## 2019-12-23 DIAGNOSIS — K219 Gastro-esophageal reflux disease without esophagitis: Secondary | ICD-10-CM | POA: Diagnosis present

## 2019-12-23 DIAGNOSIS — Z888 Allergy status to other drugs, medicaments and biological substances status: Secondary | ICD-10-CM

## 2019-12-23 DIAGNOSIS — Z8261 Family history of arthritis: Secondary | ICD-10-CM

## 2019-12-23 DIAGNOSIS — Z8249 Family history of ischemic heart disease and other diseases of the circulatory system: Secondary | ICD-10-CM | POA: Diagnosis not present

## 2019-12-23 DIAGNOSIS — M179 Osteoarthritis of knee, unspecified: Secondary | ICD-10-CM

## 2019-12-23 DIAGNOSIS — M1710 Unilateral primary osteoarthritis, unspecified knee: Secondary | ICD-10-CM

## 2019-12-23 DIAGNOSIS — Z7901 Long term (current) use of anticoagulants: Secondary | ICD-10-CM | POA: Diagnosis not present

## 2019-12-23 HISTORY — PX: TOTAL KNEE ARTHROPLASTY: SHX125

## 2019-12-23 LAB — TYPE AND SCREEN
ABO/RH(D): A POS
Antibody Screen: NEGATIVE

## 2019-12-23 SURGERY — ARTHROPLASTY, KNEE, TOTAL
Anesthesia: General | Site: Knee | Laterality: Right

## 2019-12-23 MED ORDER — EPHEDRINE 5 MG/ML INJ
INTRAVENOUS | Status: AC
  Filled 2019-12-23: qty 10

## 2019-12-23 MED ORDER — MENTHOL 3 MG MT LOZG: 1.0000 | LOZENGE | OROMUCOSAL | Status: DC | PRN

## 2019-12-23 MED ORDER — POLYETHYLENE GLYCOL 3350 17 G PO PACK: 17.0000 g | PACK | Freq: Every day | ORAL | Status: DC | PRN

## 2019-12-23 MED ORDER — HYDROMORPHONE HCL 1 MG/ML IJ SOLN: 0.2500 mg | INTRAMUSCULAR | Status: DC | PRN

## 2019-12-23 MED ORDER — SODIUM CHLORIDE (PF) 0.9 % IJ SOLN
INTRAMUSCULAR | Status: DC | PRN
  Administered 2019-12-23: 60 mL

## 2019-12-23 MED ORDER — PROMETHAZINE HCL 25 MG/ML IJ SOLN: 6.2500 mg | INTRAMUSCULAR | Status: DC | PRN

## 2019-12-23 MED ORDER — ACETAMINOPHEN 10 MG/ML IV SOLN
1000.0000 mg | Freq: Four times a day (QID) | INTRAVENOUS | Status: DC
  Administered 2019-12-23: 1000 mg via INTRAVENOUS
  Filled 2019-12-23: qty 100

## 2019-12-23 MED ORDER — SODIUM CHLORIDE (PF) 0.9 % IJ SOLN
INTRAMUSCULAR | Status: AC
  Filled 2019-12-23: qty 50

## 2019-12-23 MED ORDER — DOCUSATE SODIUM 100 MG PO CAPS
100.0000 mg | ORAL_CAPSULE | Freq: Two times a day (BID) | ORAL | Status: DC
  Administered 2019-12-23 – 2019-12-24 (×2): 100 mg via ORAL
  Filled 2019-12-23 (×2): qty 1

## 2019-12-23 MED ORDER — LIDOCAINE 2% (20 MG/ML) 5 ML SYRINGE
INTRAMUSCULAR | Status: DC | PRN
  Administered 2019-12-23: 100 mg via INTRAVENOUS

## 2019-12-23 MED ORDER — DEXAMETHASONE SODIUM PHOSPHATE 10 MG/ML IJ SOLN
10.0000 mg | Freq: Once | INTRAMUSCULAR | Status: AC
  Administered 2019-12-24: 10:00:00 10 mg via INTRAVENOUS
  Filled 2019-12-23: qty 1

## 2019-12-23 MED ORDER — STERILE WATER FOR IRRIGATION IR SOLN
Status: DC | PRN
  Administered 2019-12-23: 2000 mL

## 2019-12-23 MED ORDER — EPHEDRINE SULFATE-NACL 50-0.9 MG/10ML-% IV SOSY
PREFILLED_SYRINGE | INTRAVENOUS | Status: DC | PRN
  Administered 2019-12-23 (×3): 10 mg via INTRAVENOUS

## 2019-12-23 MED ORDER — DIPHENHYDRAMINE HCL 12.5 MG/5ML PO ELIX: 12.5000 mg | ORAL_SOLUTION | ORAL | Status: DC | PRN

## 2019-12-23 MED ORDER — DEXAMETHASONE SODIUM PHOSPHATE 10 MG/ML IJ SOLN
INTRAMUSCULAR | Status: AC
  Filled 2019-12-23: qty 1

## 2019-12-23 MED ORDER — 0.9 % SODIUM CHLORIDE (POUR BTL) OPTIME
TOPICAL | Status: DC | PRN
  Administered 2019-12-23: 1000 mL

## 2019-12-23 MED ORDER — METHOCARBAMOL 500 MG IVPB - SIMPLE MED
INTRAVENOUS | Status: AC
  Administered 2019-12-23: 500 mg via INTRAVENOUS
  Filled 2019-12-23: qty 50

## 2019-12-23 MED ORDER — HYDROCHLOROTHIAZIDE 12.5 MG PO CAPS
12.5000 mg | ORAL_CAPSULE | Freq: Every day | ORAL | Status: DC
  Administered 2019-12-24: 10:00:00 12.5 mg via ORAL
  Filled 2019-12-23: qty 1

## 2019-12-23 MED ORDER — HYDROMORPHONE HCL 1 MG/ML IJ SOLN
INTRAMUSCULAR | Status: AC
  Filled 2019-12-23: qty 1

## 2019-12-23 MED ORDER — ONDANSETRON HCL 4 MG PO TABS: 4.0000 mg | ORAL_TABLET | Freq: Four times a day (QID) | ORAL | Status: DC | PRN

## 2019-12-23 MED ORDER — OXYCODONE HCL 5 MG PO TABS
5.0000 mg | ORAL_TABLET | ORAL | Status: DC | PRN
  Administered 2019-12-23 – 2019-12-24 (×3): 5 mg via ORAL
  Filled 2019-12-23 (×3): qty 1

## 2019-12-23 MED ORDER — CEFAZOLIN SODIUM-DEXTROSE 2-4 GM/100ML-% IV SOLN
2.0000 g | INTRAVENOUS | Status: AC
  Administered 2019-12-23: 2 g via INTRAVENOUS
  Filled 2019-12-23: qty 100

## 2019-12-23 MED ORDER — FENTANYL CITRATE (PF) 100 MCG/2ML IJ SOLN
INTRAMUSCULAR | Status: AC
  Filled 2019-12-23: qty 2

## 2019-12-23 MED ORDER — TRANEXAMIC ACID-NACL 1000-0.7 MG/100ML-% IV SOLN
1000.0000 mg | INTRAVENOUS | Status: AC
  Administered 2019-12-23: 1000 mg via INTRAVENOUS
  Filled 2019-12-23: qty 100

## 2019-12-23 MED ORDER — ONDANSETRON HCL 4 MG/2ML IJ SOLN
INTRAMUSCULAR | Status: AC
  Filled 2019-12-23: qty 2

## 2019-12-23 MED ORDER — ONDANSETRON HCL 4 MG/2ML IJ SOLN
4.0000 mg | Freq: Four times a day (QID) | INTRAMUSCULAR | Status: DC | PRN
  Administered 2019-12-23: 13:00:00 4 mg via INTRAVENOUS
  Filled 2019-12-23: qty 2

## 2019-12-23 MED ORDER — METHOCARBAMOL 500 MG IVPB - SIMPLE MED
500.0000 mg | Freq: Four times a day (QID) | INTRAVENOUS | Status: DC | PRN
  Filled 2019-12-23: qty 50

## 2019-12-23 MED ORDER — OXYCODONE HCL 5 MG PO TABS: 5.0000 mg | ORAL_TABLET | Freq: Once | ORAL | Status: DC | PRN

## 2019-12-23 MED ORDER — METOCLOPRAMIDE HCL 5 MG/ML IJ SOLN
5.0000 mg | Freq: Three times a day (TID) | INTRAMUSCULAR | Status: DC | PRN
  Administered 2019-12-23: 10 mg via INTRAVENOUS
  Filled 2019-12-23: qty 2

## 2019-12-23 MED ORDER — HYDROMORPHONE HCL 2 MG/ML IJ SOLN
INTRAMUSCULAR | Status: AC
  Filled 2019-12-23: qty 1

## 2019-12-23 MED ORDER — METHOCARBAMOL 500 MG PO TABS
500.0000 mg | ORAL_TABLET | Freq: Four times a day (QID) | ORAL | Status: DC | PRN
  Administered 2019-12-23: 500 mg via ORAL
  Filled 2019-12-23: qty 1

## 2019-12-23 MED ORDER — POVIDONE-IODINE 10 % EX SWAB
2.0000 "application " | Freq: Once | CUTANEOUS | Status: AC
  Administered 2019-12-23: 2 via TOPICAL

## 2019-12-23 MED ORDER — PROPOFOL 500 MG/50ML IV EMUL
INTRAVENOUS | Status: AC
  Filled 2019-12-23: qty 50

## 2019-12-23 MED ORDER — OXYCODONE HCL 5 MG/5ML PO SOLN: 5.0000 mg | Freq: Once | ORAL | Status: DC | PRN

## 2019-12-23 MED ORDER — LACTATED RINGERS IV SOLN: INTRAVENOUS | Status: DC

## 2019-12-23 MED ORDER — FLEET ENEMA 7-19 GM/118ML RE ENEM: 1.0000 | ENEMA | Freq: Once | RECTAL | Status: DC | PRN

## 2019-12-23 MED ORDER — CEFAZOLIN SODIUM-DEXTROSE 2-4 GM/100ML-% IV SOLN
2.0000 g | Freq: Four times a day (QID) | INTRAVENOUS | Status: AC
  Administered 2019-12-23 (×2): 2 g via INTRAVENOUS
  Filled 2019-12-23 (×2): qty 100

## 2019-12-23 MED ORDER — TRAMADOL HCL 50 MG PO TABS
50.0000 mg | ORAL_TABLET | Freq: Four times a day (QID) | ORAL | Status: DC | PRN
  Administered 2019-12-23: 100 mg via ORAL
  Filled 2019-12-23: qty 2

## 2019-12-23 MED ORDER — LIDOCAINE 2% (20 MG/ML) 5 ML SYRINGE
INTRAMUSCULAR | Status: AC
  Filled 2019-12-23: qty 5

## 2019-12-23 MED ORDER — PHENOL 1.4 % MT LIQD: 1.0000 | OROMUCOSAL | Status: DC | PRN

## 2019-12-23 MED ORDER — HYDROMORPHONE HCL 1 MG/ML IJ SOLN
INTRAMUSCULAR | Status: DC | PRN
  Administered 2019-12-23 (×4): .5 mg via INTRAVENOUS

## 2019-12-23 MED ORDER — PROPOFOL 10 MG/ML IV BOLUS
INTRAVENOUS | Status: AC
  Filled 2019-12-23: qty 20

## 2019-12-23 MED ORDER — ONDANSETRON HCL 4 MG/2ML IJ SOLN
INTRAMUSCULAR | Status: DC | PRN
  Administered 2019-12-23: 4 mg via INTRAVENOUS

## 2019-12-23 MED ORDER — IRBESARTAN 150 MG PO TABS
150.0000 mg | ORAL_TABLET | Freq: Every day | ORAL | Status: DC
  Administered 2019-12-24: 150 mg via ORAL
  Filled 2019-12-23: qty 1

## 2019-12-23 MED ORDER — LORATADINE 10 MG PO TABS: 10.0000 mg | ORAL_TABLET | Freq: Every day | ORAL | Status: DC | PRN

## 2019-12-23 MED ORDER — BUPIVACAINE HCL (PF) 0.5 % IJ SOLN
INTRAMUSCULAR | Status: DC | PRN
  Administered 2019-12-23: 20 mL via PERINEURAL

## 2019-12-23 MED ORDER — BUPIVACAINE LIPOSOME 1.3 % IJ SUSP
INTRAMUSCULAR | Status: DC | PRN
  Administered 2019-12-23: 20 mL

## 2019-12-23 MED ORDER — METOPROLOL TARTRATE 50 MG PO TABS
100.0000 mg | ORAL_TABLET | Freq: Two times a day (BID) | ORAL | Status: DC
  Administered 2019-12-23 – 2019-12-24 (×2): 100 mg via ORAL
  Filled 2019-12-23 (×2): qty 2

## 2019-12-23 MED ORDER — RIVAROXABAN 10 MG PO TABS
10.0000 mg | ORAL_TABLET | Freq: Every day | ORAL | Status: DC
  Administered 2019-12-24: 10 mg via ORAL
  Filled 2019-12-23: qty 1

## 2019-12-23 MED ORDER — DEXAMETHASONE SODIUM PHOSPHATE 10 MG/ML IJ SOLN
8.0000 mg | Freq: Once | INTRAMUSCULAR | Status: AC
  Administered 2019-12-23: 8 mg via INTRAVENOUS

## 2019-12-23 MED ORDER — FENTANYL CITRATE (PF) 100 MCG/2ML IJ SOLN
INTRAMUSCULAR | Status: DC | PRN
  Administered 2019-12-23 (×2): 50 ug via INTRAVENOUS

## 2019-12-23 MED ORDER — IRBESARTAN-HYDROCHLOROTHIAZIDE 150-12.5 MG PO TABS: 1.0000 | ORAL_TABLET | Freq: Every day | ORAL | Status: DC

## 2019-12-23 MED ORDER — MORPHINE SULFATE (PF) 2 MG/ML IV SOLN: 0.5000 mg | INTRAVENOUS | Status: DC | PRN

## 2019-12-23 MED ORDER — METOCLOPRAMIDE HCL 5 MG PO TABS: 5.0000 mg | ORAL_TABLET | Freq: Three times a day (TID) | ORAL | Status: DC | PRN

## 2019-12-23 MED ORDER — ACETAMINOPHEN 500 MG PO TABS
1000.0000 mg | ORAL_TABLET | Freq: Four times a day (QID) | ORAL | Status: DC
  Administered 2019-12-23 – 2019-12-24 (×3): 1000 mg via ORAL
  Filled 2019-12-23 (×3): qty 2

## 2019-12-23 MED ORDER — BISACODYL 10 MG RE SUPP: 10.0000 mg | Freq: Every day | RECTAL | Status: DC | PRN

## 2019-12-23 MED ORDER — SODIUM CHLORIDE (PF) 0.9 % IJ SOLN
INTRAMUSCULAR | Status: AC
  Filled 2019-12-23: qty 10

## 2019-12-23 MED ORDER — CHLORHEXIDINE GLUCONATE 4 % EX LIQD: 60.0000 mL | Freq: Once | CUTANEOUS | Status: DC

## 2019-12-23 MED ORDER — SODIUM CHLORIDE 0.9 % IR SOLN
Status: DC | PRN
  Administered 2019-12-23: 1000 mL

## 2019-12-23 MED ORDER — DILTIAZEM HCL ER COATED BEADS 240 MG PO CP24
240.0000 mg | ORAL_CAPSULE | Freq: Every day | ORAL | Status: DC
  Administered 2019-12-24: 08:00:00 240 mg via ORAL
  Filled 2019-12-23: qty 1

## 2019-12-23 MED ORDER — GABAPENTIN 300 MG PO CAPS
300.0000 mg | ORAL_CAPSULE | Freq: Two times a day (BID) | ORAL | Status: DC
  Administered 2019-12-23 – 2019-12-24 (×2): 300 mg via ORAL
  Filled 2019-12-23 (×2): qty 1

## 2019-12-23 MED ORDER — SODIUM CHLORIDE 0.9 % IV SOLN: INTRAVENOUS | Status: DC

## 2019-12-23 SURGICAL SUPPLY — 55 items
BAG SPEC THK2 15X12 ZIP CLS (MISCELLANEOUS) ×1
BAG ZIPLOCK 12X15 (MISCELLANEOUS) ×2 IMPLANT
BLADE SAG 18X100X1.27 (BLADE) ×2 IMPLANT
BLADE SAW SGTL 11.0X1.19X90.0M (BLADE) ×2 IMPLANT
BLADE SURG SZ10 CARB STEEL (BLADE) ×4 IMPLANT
BNDG ELASTIC 6X5.8 VLCR STR LF (GAUZE/BANDAGES/DRESSINGS) ×2 IMPLANT
BOWL SMART MIX CTS (DISPOSABLE) ×2 IMPLANT
CEMENT HV SMART SET (Cement) ×4 IMPLANT
CEMENT TIBIA MBT SIZE 5 (Knees) IMPLANT
COVER SURGICAL LIGHT HANDLE (MISCELLANEOUS) ×2 IMPLANT
COVER WAND RF STERILE (DRAPES) ×1 IMPLANT
CUFF TOURN SGL QUICK 34 (TOURNIQUET CUFF) ×2
CUFF TRNQT CYL 34X4.125X (TOURNIQUET CUFF) ×1 IMPLANT
DECANTER SPIKE VIAL GLASS SM (MISCELLANEOUS) ×2 IMPLANT
DRAPE U-SHAPE 47X51 STRL (DRAPES) ×2 IMPLANT
DRSG AQUACEL AG ADV 3.5X10 (GAUZE/BANDAGES/DRESSINGS) ×2 IMPLANT
DURAPREP 26ML APPLICATOR (WOUND CARE) ×2 IMPLANT
ELECT REM PT RETURN 15FT ADLT (MISCELLANEOUS) ×2 IMPLANT
EVACUATOR 1/8 PVC DRAIN (DRAIN) ×1 IMPLANT
FEMUR SIGMA PS SZ 5.0 R (Femur) ×1 IMPLANT
GAUZE SPONGE 2X2 8PLY STRL LF (GAUZE/BANDAGES/DRESSINGS) ×1 IMPLANT
GLOVE BIO SURGEON STRL SZ7 (GLOVE) ×2 IMPLANT
GLOVE BIO SURGEON STRL SZ8 (GLOVE) ×2 IMPLANT
GLOVE BIOGEL PI IND STRL 7.0 (GLOVE) ×1 IMPLANT
GLOVE BIOGEL PI IND STRL 8 (GLOVE) ×1 IMPLANT
GLOVE BIOGEL PI INDICATOR 7.0 (GLOVE) ×1
GLOVE BIOGEL PI INDICATOR 8 (GLOVE) ×1
GOWN STRL REUS W/TWL LRG LVL3 (GOWN DISPOSABLE) ×4 IMPLANT
HANDPIECE INTERPULSE COAX TIP (DISPOSABLE) ×2
HOLDER FOLEY CATH W/STRAP (MISCELLANEOUS) IMPLANT
IMMOBILIZER KNEE 20 (SOFTGOODS) ×2
IMMOBILIZER KNEE 20 THIGH 36 (SOFTGOODS) ×1 IMPLANT
KIT TURNOVER KIT A (KITS) IMPLANT
MANIFOLD NEPTUNE II (INSTRUMENTS) ×2 IMPLANT
NS IRRIG 1000ML POUR BTL (IV SOLUTION) ×2 IMPLANT
PACK TOTAL KNEE CUSTOM (KITS) ×2 IMPLANT
PADDING CAST COTTON 6X4 STRL (CAST SUPPLIES) ×3 IMPLANT
PATELLA DOME PFC 38MM (Knees) ×1 IMPLANT
PENCIL SMOKE EVACUATOR (MISCELLANEOUS) ×1 IMPLANT
PIN STEINMAN FIXATION KNEE (PIN) ×1 IMPLANT
PLATE ROT INSERT 10MM SIZE 5 (Plate) ×1 IMPLANT
PROTECTOR NERVE ULNAR (MISCELLANEOUS) ×2 IMPLANT
SET HNDPC FAN SPRY TIP SCT (DISPOSABLE) ×1 IMPLANT
SPONGE GAUZE 2X2 STER 10/PKG (GAUZE/BANDAGES/DRESSINGS) ×1
STRIP CLOSURE SKIN 1/2X4 (GAUZE/BANDAGES/DRESSINGS) ×4 IMPLANT
SUT MNCRL AB 4-0 PS2 18 (SUTURE) ×2 IMPLANT
SUT STRATAFIX 0 PDS 27 VIOLET (SUTURE) ×2
SUT VIC AB 2-0 CT1 27 (SUTURE) ×6
SUT VIC AB 2-0 CT1 TAPERPNT 27 (SUTURE) ×3 IMPLANT
SUTURE STRATFX 0 PDS 27 VIOLET (SUTURE) ×1 IMPLANT
TIBIA MBT CEMENT SIZE 5 (Knees) ×2 IMPLANT
TRAY FOLEY MTR SLVR 16FR STAT (SET/KITS/TRAYS/PACK) ×1 IMPLANT
WATER STERILE IRR 1000ML POUR (IV SOLUTION) ×4 IMPLANT
WRAP KNEE MAXI GEL POST OP (GAUZE/BANDAGES/DRESSINGS) ×2 IMPLANT
YANKAUER SUCT BULB TIP 10FT TU (MISCELLANEOUS) ×2 IMPLANT

## 2019-12-23 NOTE — Progress Notes (Signed)
Orthopedic Tech Progress Note Patient Details:  AVETIS CASTER [birth year] [id number]  CPM Right Knee CPM Right Knee: On Right Knee Flexion (Degrees): 40 Right Knee Extension (Degrees): 10  Post Interventions Patient Tolerated: Well Instructions Provided: Care of device Ortho Devices Type of Ortho Device: CPM padding Ortho Device/Splint Location: RLE Ortho Device/Splint Interventions: Ordered, Application, Adjustment   Post Interventions Patient Tolerated: Well Instructions Provided: Care of device   Staci Righter 12/23/2019, 9:13 AM

## 2019-12-23 NOTE — Progress Notes (Signed)
Orthopedic Tech Progress Note Patient Details:  Brian Davidson [birth year] [id number] Patient out of CPM Patient ID: Brian Davidson, male   DOB: 1936/04/01, 84 y.o.   MRN: [id number]   Staci Righter 12/23/2019, 3:02 PM

## 2019-12-23 NOTE — Anesthesia Postprocedure Evaluation (Signed)
Anesthesia Post Note  Patient: Brian Davidson  Procedure(s) Performed: TOTAL KNEE ARTHROPLASTY (Right Knee)     Patient location during evaluation: PACU Anesthesia Type: General Level of consciousness: awake and alert Pain management: pain level controlled Vital Signs Assessment: post-procedure vital signs reviewed and stable Respiratory status: spontaneous breathing, nonlabored ventilation, respiratory function stable and patient connected to nasal cannula oxygen Cardiovascular status: blood pressure returned to baseline and stable Postop Assessment: no apparent nausea or vomiting Anesthetic complications: no    Last Vitals:  Vitals:   12/23/19 1157 12/23/19 1256  BP: 139/75 136/70  Pulse: 60 (!) 55  Resp: 18 18  Temp: 36.5 C 36.6 C  SpO2: 92% 94%    Last Pain:  Vitals:   12/23/19 1420  TempSrc:   PainSc: 2                  Keyah Blizard S

## 2019-12-23 NOTE — Progress Notes (Signed)
Met with pt and wife to confirm DME and follow up arrangements. They confirm that pt has all needed DME and they have OPPT already arranged for [place]. No needs.  Regla Fitzgibbon, LCSW

## 2019-12-23 NOTE — Anesthesia Procedure Notes (Signed)
Anesthesia Regional Block: Adductor canal block   Pre-Anesthetic Checklist: ,, timeout performed, Correct Patient, Correct Site, Correct Laterality, Correct Procedure, Correct Position, site marked, Risks and benefits discussed,  Surgical consent,  Pre-op evaluation,  At surgeon's request and post-op pain management  Laterality: Right  Prep: chloraprep       Needles:  Injection technique: Single-shot  Needle Type: Echogenic Needle     Needle Length: 9cm      Additional Needles:   Procedures:,,,, ultrasound used (permanent image in chart),,,,  Narrative:  Start time: 12/23/2019 6:46 AM End time: 12/23/2019 6:52 AM Injection made incrementally with aspirations every 5 mL.  Performed by: Personally  Anesthesiologist: Myrtie Soman, MD  Additional Notes: Patient tolerated the procedure well without complications

## 2019-12-23 NOTE — Anesthesia Procedure Notes (Signed)
Anesthesia Procedure Image    

## 2019-12-23 NOTE — Interval H&P Note (Signed)
History and Physical Interval Note:  12/23/2019 6:49 AM  Brian Davidson  has presented today for surgery, with the diagnosis of right knee osteoarthritis.  The various methods of treatment have been discussed with the patient and family. After consideration of risks, benefits and other options for treatment, the patient has consented to  Procedure(s) with comments: TOTAL KNEE ARTHROPLASTY (Right) - 62min as a surgical intervention.  The patient's history has been reviewed, patient examined, no change in status, stable for surgery.  I have reviewed the patient's chart and labs.  Questions were answered to the patient's satisfaction.     Pilar Plate [name]

## 2019-12-23 NOTE — Evaluation (Signed)
Physical Therapy Evaluation Patient Details Name: Brian Davidson MRN: [id number] DOB: Mar 19, 1936 Today's Date: 12/23/2019   History of Present Illness  s/p R TKA  Clinical Impression  Pt is s/p TKA resulting in the deficits listed below (see PT Problem List).  Pt amb 60' with RW and min assist. Pain well controlled. Anticipate he will progress very well in acute setting.  Pt will benefit from skilled PT to increase their independence and safety with mobility to allow discharge to the venue listed below.      Follow Up Recommendations Follow surgeon's recommendation for DC plan and follow-up therapies    Equipment Recommendations  None recommended by PT    Recommendations for Other Services       Precautions / Restrictions Precautions Precautions: Fall;Knee Required Braces or Orthoses: Knee Immobilizer - Right Knee Immobilizer - Right: Discontinue once straight leg raise with < 10 degree lag Restrictions Weight Bearing Restrictions: No Other Position/Activity Restrictions: WBAT      Mobility  Bed Mobility Overal bed mobility: Needs Assistance Bed Mobility: Supine to Sit     Supine to sit: Min assist     General bed mobility comments: assist with RLE  Transfers Overall transfer level: Needs assistance Equipment used: Rolling walker (2 wheeled) Transfers: Sit to/from Stand Sit to Stand: Min assist;Min guard         General transfer comment: cues for hand placement and RLE position  Ambulation/Gait Ambulation/Gait assistance: Min Web designer (Feet): 60 Feet Assistive device: Rolling walker (2 wheeled) Gait Pattern/deviations: Step-to pattern;Decreased stance time - right     General Gait Details: cues for sequence and RW position  Stairs            Wheelchair Mobility    Modified Rankin (Stroke Patients Only)       Balance                                             Pertinent Vitals/Pain Pain Assessment:  0-10 Pain Score: 3  Pain Location: right knee Pain Descriptors / Indicators: Discomfort Pain Intervention(s): Limited activity within patient's tolerance;Monitored during session;Premedicated before session;Repositioned    Home Living Family/patient expects to be discharged to:: Private residence Living Arrangements: Spouse/significant other Available Help at Discharge: Family Type of Home: House Home Access: Ramped entrance(steep)     Home Layout: One level Home Equipment: Environmental consultant - 2 wheels;Cane - single point Additional Comments: walks 2 miles per day    Prior Function Level of Independence: Independent               Hand Dominance        Extremity/Trunk Assessment   Upper Extremity Assessment Upper Extremity Assessment: Overall WFL for tasks assessed    Lower Extremity Assessment Lower Extremity Assessment: RLE deficits/detail RLE Deficits / Details: ankle WFL, knee extension and hip flexion grossly 2+/5; AAROM knee flexion ~5 to 55 degrees       Communication   Communication: No difficulties  Cognition Arousal/Alertness: Awake/alert Behavior During Therapy: WFL for tasks assessed/performed Overall Cognitive Status: Within Functional Limits for tasks assessed                                        General Comments      Exercises Total Joint  Exercises Ankle Circles/Pumps: AROM;Both;10 reps Quad Sets: AROM;Both;5 reps   Assessment/Plan    PT Assessment Patient needs continued PT services  PT Problem List Decreased strength;Decreased range of motion;Decreased activity tolerance;Decreased mobility;Pain;Decreased knowledge of use of DME;Decreased knowledge of precautions       PT Treatment Interventions DME instruction;Therapeutic exercise;Gait training;Functional mobility training;Therapeutic activities;Patient/family education    PT Goals (Current goals can be found in the Care Plan section)  Acute Rehab PT Goals Patient Stated  Goal: get back to being active PT Goal Formulation: With patient Time For Goal Achievement: 12/30/19 Potential to Achieve Goals: Good    Frequency 7X/week   Barriers to discharge        Co-evaluation               AM-PAC PT "6 Clicks" Mobility  Outcome Measure Help needed turning from your back to your side while in a flat bed without using bedrails?: A Little Help needed moving from lying on your back to sitting on the side of a flat bed without using bedrails?: A Little Help needed moving to and from a bed to a chair (including a wheelchair)?: A Little Help needed standing up from a chair using your arms (e.g., wheelchair or bedside chair)?: A Little Help needed to walk in hospital room?: A Little Help needed climbing 3-5 steps with a railing? : A Little 6 Click Score: 18    End of Session Equipment Utilized During Treatment: Gait belt;Right knee immobilizer Activity Tolerance: Patient tolerated treatment well Patient left: in chair;with call bell/phone within reach;with chair alarm set;with family/visitor present   PT Visit Diagnosis: Difficulty in walking, not elsewhere classified (R26.2)    Time: [id number] PT Time Calculation (min) (ACUTE ONLY): 24 min   Charges:   PT Evaluation $PT Eval Low Complexity: 1 Low PT Treatments $Gait Training: 8-22 mins        Baxter Flattery, PT   Acute Rehab Dept [facility]): [id number]   12/23/2019   [facility] 12/23/2019, 2:56 PM

## 2019-12-23 NOTE — Anesthesia Procedure Notes (Signed)
Procedure Name: LMA Insertion Date/Time: 12/23/2019 7:25 AM Performed by: Camielle Sizer D, CRNA Pre-anesthesia Checklist: Patient identified, Emergency Drugs available, Suction available and Patient being monitored Patient Re-evaluated:Patient Re-evaluated prior to induction Oxygen Delivery Method: Circle system utilized Preoxygenation: Pre-oxygenation with 100% oxygen Induction Type: IV induction Ventilation: Mask ventilation without difficulty LMA: LMA inserted LMA Size: 4.0 Tube type: Oral Number of attempts: 1 Placement Confirmation: positive ETCO2 and breath sounds checked- equal and bilateral Tube secured with: Tape Dental Injury: Teeth and Oropharynx as per pre-operative assessment

## 2019-12-23 NOTE — Transfer of Care (Signed)
Immediate Anesthesia Transfer of Care Note  Patient: PHARES DIEMERT  Procedure(s) Performed: TOTAL KNEE ARTHROPLASTY (Right Knee)  Patient Location: PACU  Anesthesia Type:General and Regional  Level of Consciousness: awake, alert  and oriented  Airway & Oxygen Therapy: Patient Spontanous Breathing and Patient connected to face mask oxygen  Post-op Assessment: Report given to RN and Post -op Vital signs reviewed and stable  Post vital signs: Reviewed and stable  Last Vitals:  Vitals Value Taken Time  BP 103/64 12/23/19 0856  Temp    Pulse 60 12/23/19 0859  Resp 12 12/23/19 0859  SpO2 96 % 12/23/19 0859  Vitals shown include unvalidated device data.  Last Pain: There were no vitals filed for this visit.    Patients Stated Pain Goal: 4 ([id number])  Complications: No apparent anesthesia complications

## 2019-12-23 NOTE — Op Note (Signed)
OPERATIVE REPORT-TOTAL KNEE ARTHROPLASTY   Pre-operative diagnosis- Osteoarthritis  Right knee(s)  Post-operative diagnosis- Osteoarthritis Right knee(s)  Procedure-  Right  Total Knee Arthroplasty  Surgeon- Brian Davidson. Brian Gronau, MD  Assistant- Brian Duty, PA-C   Anesthesia-  GA combined with regional for post-op pain  EBL-50 mL   Drains Hemovac  Tourniquet time-  Total Tourniquet Time Documented: Thigh (Right) - 38 minutes Total: Thigh (Right) - 38 minutes     Complications- None  Condition-PACU - hemodynamically stable.   Brief Clinical Note  Brian Davidson is a 84 y.o. year old male with end stage OA of his right knee with progressively worsening pain and dysfunction. He has constant pain, with activity and at rest and significant functional deficits with difficulties even with ADLs. He has had extensive non-op management including analgesics, injections of cortisone and viscosupplements, and home exercise program, but remains in significant pain with significant dysfunction. Radiographs show bone on bone arthritis medial and patellofemoral. He presents now for right Total Knee Arthroplasty.    Procedure in detail---   The patient is brought into the operating room and positioned supine on the operating table. After successful administration of  GA combined with regional for post-op pain,   a tourniquet is placed high on the  Right thigh(s) and the lower extremity is prepped and draped in the usual sterile fashion. Time out is performed by the operating team and then the  Right lower extremity is wrapped in Esmarch, knee flexed and the tourniquet inflated to 300 mmHg.       A midline incision is made with a ten blade through the subcutaneous tissue to the level of the extensor mechanism. A fresh blade is used to make a medial parapatellar arthrotomy. Soft tissue over the proximal medial tibia is subperiosteally elevated to the joint line with a knife and into the  semimembranosus bursa with a Cobb elevator. Soft tissue over the proximal lateral tibia is elevated with attention being paid to avoiding the patellar tendon on the tibial tubercle. The patella is everted, knee flexed 90 degrees and the ACL and PCL are removed. Findings are bone on bone medial and patellofemoral        The drill is used to create a starting hole in the distal femur and the canal is thoroughly irrigated with sterile saline to remove the fatty contents. The 5 degree Right  valgus alignment guide is placed into the femoral canal and the distal femoral cutting block is pinned to remove 10 mm off the distal femur. Resection is made with an oscillating saw.      The tibia is subluxed forward and the menisci are removed. The extramedullary alignment guide is placed referencing proximally at the medial aspect of the tibial tubercle and distally along the second metatarsal axis and tibial crest. The block is pinned to remove 80mm off the more deficient medial  side. Resection is made with an oscillating saw. Size 5is the most appropriate size for the tibia and the proximal tibia is prepared with the modular drill and keel punch for that size.      The femoral sizing guide is placed and size 5 is most appropriate. Rotation is marked off the epicondylar axis and confirmed by creating a rectangular flexion gap at 90 degrees. The size 5 cutting block is pinned in this rotation and the anterior, posterior and chamfer cuts are made with the oscillating saw. The intercondylar block is then placed and that cut is made.  Trial size 5 tibial component, trial size 5 posterior stabilized femur and a 10  mm posterior stabilized rotating platform insert trial is placed. Full extension is achieved with excellent varus/valgus and anterior/posterior balance throughout full range of motion. The patella is everted and thickness measured to be 24  mm. Free hand resection is taken to 14 mm, a 38 template is placed, lug  holes are drilled, trial patella is placed, and it tracks normally. Osteophytes are removed off the posterior femur with the trial in place. All trials are removed and the cut bone surfaces prepared with pulsatile lavage. Cement is mixed and once ready for implantation, the size 5 tibial implant, size  5 posterior stabilized femoral component, and the size 38 patella are cemented in place and the patella is held with the clamp. The trial insert is placed and the knee held in full extension. The Exparel (20 ml mixed with 60 ml saline) is injected into the extensor mechanism, posterior capsule, medial and lateral gutters and subcutaneous tissues.  All extruded cement is removed and once the cement is hard the permanent 10 mm posterior stabilized rotating platform insert is placed into the tibial tray.      The wound is copiously irrigated with saline solution and the extensor mechanism closed over a hemovac drain with #1 V-loc suture. The tourniquet is released for a total tourniquet time of 38 minutes. Flexion against gravity is 140 degrees and the patella tracks normally. Subcutaneous tissue is closed with 2.0 vicryl and subcuticular with running 4.0 Monocryl. The incision is cleaned and dried and steri-strips and a bulky sterile dressing are applied. The limb is placed into a knee immobilizer and the patient is awakened and transported to recovery in stable condition.      Please note that a surgical assistant was a medical necessity for this procedure in order to perform it in a safe and expeditious manner. Surgical assistant was necessary to retract the ligaments and vital neurovascular structures to prevent injury to them and also necessary for proper positioning of the limb to allow for anatomic placement of the prosthesis.   Brian Davidson Brian Torti, MD    12/23/2019, 8:28 AM

## 2019-12-23 NOTE — Discharge Instructions (Addendum)
 Frank Aluisio, MD Total Joint Specialist [facility] [address] [place], [place] [address] [phone]  TOTAL KNEE REPLACEMENT POSTOPERATIVE DIRECTIONS    Knee Rehabilitation, Guidelines Following Surgery  Results after knee surgery are often greatly improved when you follow the exercise, range of motion and muscle strengthening exercises prescribed by your doctor. Safety measures are also important to protect the knee from further injury. If any of these exercises cause you to have increased pain or swelling in your knee joint, decrease the amount until you are comfortable again and slowly increase them. If you have problems or questions, call your caregiver or physical therapist for advice.   HOME CARE INSTRUCTIONS  . Remove items at home which could result in a fall. This includes throw rugs or furniture in walking pathways.  . ICE to the affected knee as much as tolerated. Icing helps control swelling. If the swelling is well controlled you will be more comfortable and rehab easier. Continue to use ice on the knee for pain and swelling from surgery. You may notice swelling that will progress down to the foot and ankle. This is normal after surgery. Elevate the leg when you are not up walking on it.    . Continue to use the breathing machine which will help keep your temperature down. It is common for your temperature to cycle up and down following surgery, especially at night when you are not up moving around and exerting yourself. The breathing machine keeps your lungs expanded and your temperature down. . Do not place pillow under the operative knee, focus on keeping the knee straight while resting  DIET You may resume your previous home diet once you are discharged from the hospital.  DRESSING / WOUND CARE / SHOWERING . Keep your bulky bandage on for 2 days. On the third post-operative day you may remove the Ace bandage and gauze. There is a  waterproof adhesive bandage on your skin which will stay in place until your first follow-up appointment. Once you remove this you will not need to place another bandage . You may begin showering 3 days following surgery, but do not submerge the incision under water.  ACTIVITY For the first 5 days, the key is rest and control of pain and swelling . Do your home exercises twice a day starting on post-operative day 3. On the days you go to physical therapy, just do the home exercises once that day. . You should rest, ice and elevate the leg for 50 minutes out of every hour. Get up and walk/stretch for 10 minutes per hour. After 5 days you can increase your activity slowly as tolerated. . Walk with your walker as instructed. Use the walker until you are comfortable transitioning to a cane. Walk with the cane in the opposite hand of the operative leg. You may discontinue the cane once you are comfortable and walking steadily. . Avoid periods of inactivity such as sitting longer than an hour when not asleep. This helps prevent blood clots.  . You may discontinue the knee immobilizer once you are able to perform a straight leg raise while lying down. . You may resume a sexual relationship in one month or when given the OK by your doctor.  . You may return to work once you are cleared by your doctor.  . Do not drive a car for 6 weeks or until released by your surgeon.  . Do not drive while taking narcotics.  TED HOSE   STOCKINGS Wear the elastic stockings on both legs for three weeks following surgery during the day. You may remove them at night for sleeping.  WEIGHT BEARING Weight bearing as tolerated with assist device (walker, cane, etc) as directed, use it as long as suggested by your surgeon or therapist, typically at least 4-6 weeks.  POSTOPERATIVE CONSTIPATION PROTOCOL Constipation - defined medically as fewer than three stools per week and severe constipation as less than one stool per  week.  One of the most common issues patients have following surgery is constipation.  Even if you have a regular bowel pattern at home, your normal regimen is likely to be disrupted due to multiple reasons following surgery.  Combination of anesthesia, postoperative narcotics, change in appetite and fluid intake all can affect your bowels.  In order to avoid complications following surgery, here are some recommendations in order to help you during your recovery period.  . Colace (docusate) - Pick up an over-the-counter form of Colace or another stool softener and take twice a day as long as you are requiring postoperative pain medications.  Take with a full glass of water daily.  If you experience loose stools or diarrhea, hold the colace until you stool forms back up. If your symptoms do not get better within 1 week or if they get worse, check with your doctor. . Dulcolax (bisacodyl) - Pick up over-the-counter and take as directed by the product packaging as needed to assist with the movement of your bowels.  Take with a full glass of water.  Use this product as needed if not relieved by Colace only.  . MiraLax (polyethylene glycol) - Pick up over-the-counter to have on hand. MiraLax is a solution that will increase the amount of water in your bowels to assist with bowel movements.  Take as directed and can mix with a glass of water, juice, soda, coffee, or tea. Take if you go more than two days without a movement. Do not use MiraLax more than once per day. Call your doctor if you are still constipated or irregular after using this medication for 7 days in a row.  If you continue to have problems with postoperative constipation, please contact the office for further assistance and recommendations.  If you experience "the worst abdominal pain ever" or develop nausea or vomiting, please contact the office immediatly for further recommendations for treatment.  ITCHING If you experience itching with your  medications, try taking only a single pain pill, or even half a pain pill at a time.  You can also use Benadryl over the counter for itching or also to help with sleep.   MEDICATIONS See your medication summary on the "After Visit Summary" that the nursing staff will review with you prior to discharge.  You may have some home medications which will be placed on hold until you complete the course of blood thinner medication.  It is important for you to complete the blood thinner medication as prescribed by your surgeon.  Continue your approved medications as instructed at time of discharge.  PRECAUTIONS . If you experience chest pain or shortness of breath - call 911 immediately for transfer to the hospital emergency department.  . If you develop a fever greater that 101 F, purulent drainage from wound, increased redness or drainage from wound, foul odor from the wound/dressing, or calf pain - CONTACT YOUR SURGEON.                                                     FOLLOW-UP APPOINTMENTS Make sure you keep all of your appointments after your operation with your surgeon and caregivers. You should call the office at the above phone number and make an appointment for approximately two weeks after the date of your surgery or on the date instructed by your surgeon outlined in the "After Visit Summary".  RANGE OF MOTION AND STRENGTHENING EXERCISES  Rehabilitation of the knee is important following a knee injury or an operation. After just a few days of immobilization, the muscles of the thigh which control the knee become weakened and shrink (atrophy). Knee exercises are designed to build up the tone and strength of the thigh muscles and to improve knee motion. Often times heat used for twenty to thirty minutes before working out will loosen up your tissues and help with improving the range of motion but do not use heat for the first two weeks following surgery. These exercises can be done on a training  (exercise) mat, on the floor, on a table or on a bed. Use what ever works the best and is most comfortable for you Knee exercises include:  . Leg Lifts - While your knee is still immobilized in a splint or cast, you can do straight leg raises. Lift the leg to 60 degrees, hold for 3 sec, and slowly lower the leg. Repeat 10-20 times 2-3 times daily. Perform this exercise against resistance later as your knee gets better.  . Quad and Hamstring Sets - Tighten up the muscle on the front of the thigh (Quad) and hold for 5-10 sec. Repeat this 10-20 times hourly. Hamstring sets are done by pushing the foot backward against an object and holding for 5-10 sec. Repeat as with quad sets.   Leg Slides: Lying on your back, slowly slide your foot toward your buttocks, bending your knee up off the floor (only go as far as is comfortable). Then slowly slide your foot back down until your leg is flat on the floor again.  Angel Wings: Lying on your back spread your legs to the side as far apart as you can without causing discomfort.  A rehabilitation program following serious knee injuries can speed recovery and prevent re-injury in the future due to weakened muscles. Contact your doctor or a physical therapist for more information on knee rehabilitation.   IF YOU ARE TRANSFERRED TO A SKILLED REHAB FACILITY If the patient is transferred to a skilled rehab facility following release from the hospital, a list of the current medications will be sent to the facility for the patient to continue.  When discharged from the skilled rehab facility, please have the facility set up the patient's Home Health Physical Therapy prior to being released. Also, the skilled facility will be responsible for providing the patient with their medications at time of release from the facility to include their pain medication, the muscle relaxants, and their blood thinner medication. If the patient is still at the rehab facility at time of the two  week follow up appointment, the skilled rehab facility will also need to assist the patient in arranging follow up appointment in our office and any transportation needs.  MAKE SURE YOU:  . Understand these instructions.  . Get help right away if you are not doing well or get worse.   DENTAL ANTIBIOTICS:  In most cases prophylactic antibiotics for Dental procdeures after total joint surgery are not necessary.  Exceptions are as follows:  1. History of prior total joint infection  2. Severely immunocompromised (  Organ Transplant, cancer chemotherapy, Rheumatoid biologic meds such as [place])  3. Poorly controlled diabetes (A1C &gt; 8.0, blood glucose over 200)  If you have one of these conditions, contact your surgeon for an antibiotic prescription, prior to your dental procedure.    Pick up stool softner and laxative for home use following surgery while on pain medications. Do not submerge incision under water. Please use good hand washing techniques while changing dressing each day. May shower starting three days after surgery. Please use a clean towel to pat the incision dry following showers. Continue to use ice for pain and swelling after surgery. Do not use any lotions or creams on the incision until instructed by your surgeon.  Information on my medicine - XARELTO (Rivaroxaban)  This medication education was reviewed with me or my healthcare representative as part of my discharge preparation.  The pharmacist that spoke with me during my hospital stay was:    Why was Xarelto prescribed for you? Xarelto was prescribed for you to reduce the risk of blood clots forming after orthopedic surgery. The medical term for these abnormal blood clots is venous thromboembolism (VTE).  What do you need to know about xarelto ? Take your Xarelto ONCE DAILY at the same time every day. You may take it either with or without food.  If you have difficulty swallowing the tablet whole,  you may crush it and mix in applesauce just prior to taking your dose.  Take Xarelto exactly as prescribed by your doctor and DO NOT stop taking Xarelto without talking to the doctor who prescribed the medication.  Stopping without other VTE prevention medication to take the place of Xarelto may increase your risk of developing a clot.  After discharge, you should have regular check-up appointments with your healthcare provider that is prescribing your Xarelto.    What do you do if you miss a dose? If you miss a dose, take it as soon as you remember on the same day then continue your regularly scheduled once daily regimen the next day. Do not take two doses of Xarelto on the same day.   Important Safety Information A possible side effect of Xarelto is bleeding. You should call your healthcare provider right away if you experience any of the following: ? Bleeding from an injury or your nose that does not stop. ? Unusual colored urine (red or dark brown) or unusual colored stools (red or black). ? Unusual bruising for unknown reasons. ? A serious fall or if you hit your head (even if there is no bleeding).  Some medicines may interact with Xarelto and might increase your risk of bleeding while on Xarelto. To help avoid this, consult your healthcare provider or pharmacist prior to using any new prescription or non-prescription medications, including herbals, vitamins, non-steroidal anti-inflammatory drugs (NSAIDs) and supplements.  This website has more information on Xarelto: [email or web address]

## 2019-12-24 LAB — BASIC METABOLIC PANEL
Anion gap: 9 (ref 5–15)
BUN: 26 mg/dL — ABNORMAL HIGH (ref 8–23)
CO2: 24 mmol/L (ref 22–32)
Calcium: 8.6 mg/dL — ABNORMAL LOW (ref 8.9–10.3)
Chloride: 104 mmol/L (ref 98–111)
Creatinine, Ser: 1.38 mg/dL — ABNORMAL HIGH (ref 0.61–1.24)
GFR calc Af Amer: 55 mL/min — ABNORMAL LOW (ref >60)
GFR calc non Af Amer: 47 mL/min — ABNORMAL LOW (ref >60)
Glucose, Bld: 157 mg/dL — ABNORMAL HIGH (ref 70–99)
Potassium: 4.2 mmol/L (ref 3.5–5.1)
Sodium: 137 mmol/L (ref 135–145)

## 2019-12-24 LAB — CBC
HCT: 42.9 % (ref 39.0–52.0)
Hemoglobin: 13.9 g/dL (ref 13.0–17.0)
MCH: 30.2 pg (ref 26.0–34.0)
MCHC: 32.4 g/dL (ref 30.0–36.0)
MCV: 93.3 fL (ref 80.0–100.0)
Platelets: 135 10*3/uL — ABNORMAL LOW (ref 150–400)
RBC: 4.6 MIL/uL (ref 4.22–5.81)
RDW: 13.6 % (ref 11.5–15.5)
WBC: 14.7 10*3/uL — ABNORMAL HIGH (ref 4.0–10.5)
nRBC: 0 % (ref 0.0–0.2)

## 2019-12-24 MED ORDER — METHOCARBAMOL 500 MG PO TABS: 500.0000 mg | ORAL_TABLET | Freq: Four times a day (QID) | ORAL | 0 refills | Status: DC | PRN

## 2019-12-24 MED ORDER — OXYCODONE HCL 5 MG PO TABS: 5.0000 mg | ORAL_TABLET | Freq: Four times a day (QID) | ORAL | 0 refills | Status: DC | PRN

## 2019-12-24 MED ORDER — TRAMADOL HCL 50 MG PO TABS: 50.0000 mg | ORAL_TABLET | Freq: Four times a day (QID) | ORAL | 0 refills | Status: DC | PRN

## 2019-12-24 NOTE — Progress Notes (Signed)
Patient's EKG was normal.

## 2019-12-24 NOTE — Progress Notes (Signed)
Physical Therapy Treatment Patient Details Name: Brian Davidson MRN: [id number] DOB: [birth year] Today's Date: 12/24/2019    History of Present Illness s/p R TKA    PT Comments    Pt progressing very well today. Reviewed TKA HEP, amb 120' with RW and supervision. Ready for d/c from PT standpoint.    Follow Up Recommendations  Follow surgeon's recommendation for DC plan and follow-up therapies     Equipment Recommendations  None recommended by PT    Recommendations for Other Services       Precautions / Restrictions Precautions Precautions: Fall;Knee Required Braces or Orthoses: Knee Immobilizer - Right Knee Immobilizer - Right: Discontinue once straight leg raise with < 10 degree lag Restrictions Weight Bearing Restrictions: No Other Position/Activity Restrictions: WBAT    Mobility  Bed Mobility Overal bed mobility: Needs Assistance Bed Mobility: Supine to Sit     Supine to sit: Min guard     General bed mobility comments: for safety   Transfers Overall transfer level: Needs assistance Equipment used: Rolling walker (2 wheeled) Transfers: Sit to/from Stand Sit to Stand: Min guard;Supervision         General transfer comment: cues for hand placement and RLE position  Ambulation/Gait Ambulation/Gait assistance: Supervision Gait Distance (Feet): 120 Feet Assistive device: Rolling walker (2 wheeled) Gait Pattern/deviations: Step-to pattern;Decreased stance time - right     General Gait Details: cues for sequence and RW position   Stairs             Wheelchair Mobility    Modified Rankin (Stroke Patients Only)       Balance                                            Cognition Arousal/Alertness: Awake/alert Behavior During Therapy: WFL for tasks assessed/performed Overall Cognitive Status: Within Functional Limits for tasks assessed                                        Exercises Total Joint  Exercises Ankle Circles/Pumps: AROM;Both;10 reps Quad Sets: AROM;Both;10 reps Heel Slides: AAROM;Right;10 reps Hip ABduction/ADduction: AROM;Right;10 reps Straight Leg Raises: AAROM;Right;10 reps    General Comments        Pertinent Vitals/Pain Pain Assessment: 0-10 Pain Score: 3  Pain Location: right knee Pain Descriptors / Indicators: Discomfort Pain Intervention(s): Limited activity within patient's tolerance;Monitored during session;Ice applied;RN gave pain meds during session    Home Living                      Prior Function            PT Goals (current goals can now be found in the care plan section) Acute Rehab PT Goals Patient Stated Goal: get back to being active PT Goal Formulation: With patient Time For Goal Achievement: 12/30/19 Potential to Achieve Goals: Good Progress towards PT goals: Progressing toward goals    Frequency    7X/week      PT Plan Current plan remains appropriate    Co-evaluation              AM-PAC PT "6 Clicks" Mobility   Outcome Measure  Help needed turning from your back to your side while in a flat bed without using bedrails?: None Help  needed moving from lying on your back to sitting on the side of a flat bed without using bedrails?: None Help needed moving to and from a bed to a chair (including a wheelchair)?: A Little Help needed standing up from a chair using your arms (e.g., wheelchair or bedside chair)?: A Little Help needed to walk in hospital room?: A Little Help needed climbing 3-5 steps with a railing? : A Little 6 Click Score: 20    End of Session Equipment Utilized During Treatment: Gait belt;Right knee immobilizer Activity Tolerance: Patient tolerated treatment well Patient left: in chair;with call bell/phone within reach;with chair alarm set;with family/visitor present Nurse Communication: Mobility status PT Visit Diagnosis: Difficulty in walking, not elsewhere classified (R26.2)      Time: [id number] PT Time Calculation (min) (ACUTE ONLY): 26 min  Charges:  $Gait Training: 8-22 mins $Therapeutic Exercise: 8-22 mins                     Baxter Flattery, PT   Acute Rehab Dept [facility]): [id number]   12/24/2019    [facility] 12/24/2019, 10:02 AM

## 2019-12-24 NOTE — Progress Notes (Signed)
Subjective: 1 Day Post-Op Procedure(s) (LRB): TOTAL KNEE ARTHROPLASTY (Right) Patient reports pain as mild.   Patient seen in rounds by Dr. Wynelle Link. Patient is well, and has had no acute complaints or problems. Had irregular heart rate last night, but did not receive usual dose of metoprolol. Asymptomatic this AM, states he is ready to go home. Denies chest pain or SOB. Voiding without difficulty. We will continue therapy today, ambulated 28' yesterday.   Objective: Vital signs in last 24 hours: Temp:  [97.6 F (36.4 C)-98.5 F (36.9 C)] 97.9 F (36.6 C) (04/20 0510) Pulse Rate:  [55-102] 83 (04/20 0510) Resp:  [9-20] 18 (04/20 0510) BP: (103-149)/(64-83) 149/83 (04/20 0510) SpO2:  [92 %-99 %] 99 % (04/20 0510)  Intake/Output from previous day:  Intake/Output Summary (Last 24 hours) at 12/24/2019 0657 Last data filed at 12/24/2019 0600 Gross per 24 hour  Intake 3425.8 ml  Output 1120 ml  Net 2305.8 ml     Intake/Output this shift: Total I/O In: 1042.5 [P.O.:380; I.V.:562.5; IV Piggyback:100] Out: 850 [Urine:775; Drains:75]  Labs: Recent Labs    12/24/19 0250  HGB 13.9   Recent Labs    12/24/19 0250  WBC 14.7*  RBC 4.60  HCT 42.9  PLT 135*   Recent Labs    12/24/19 0250  NA 137  K 4.2  CL 104  CO2 24  BUN 26*  CREATININE 1.38*  GLUCOSE 157*  CALCIUM 8.6*   No results for input(s): LABPT, INR in the last 72 hours.  Exam: General - Patient is Alert and Oriented Extremity - Neurologically intact Neurovascular intact Sensation intact distally Dorsiflexion/Plantar flexion intact Dressing - dressing C/D/I Motor Function - intact, moving foot and toes well on exam.   Past Medical History:  Diagnosis Date  . Arthritis   . Biliary dyskinesia   . Carotid artery disease ([place]) 07/01/2016  . Dyslipidemia 07/13/2017  . Dysrhythmia    a fib  . GERD (gastroesophageal reflux disease)    occasional  . H/O measles   . History of chicken pox   . History  of kidney stones   . Hypertension   . Neuromuscular disorder ([facility])    neuropathy feet   . Persistent atrial fibrillation ([place])   . Pneumonia 1992  . Presence of permanent cardiac pacemaker   . Tachycardia-bradycardia ([place])    a. s/p STJ dual chamber pacemaker  . Thiamine deficiency 01/19/2018  . Valvular heart disease 07/01/2016    Assessment/Plan: 1 Day Post-Op Procedure(s) (LRB): TOTAL KNEE ARTHROPLASTY (Right) Principal Problem:   OA (osteoarthritis) of knee Active Problems:   Primary osteoarthritis of right knee  Estimated body mass index is 29.88 kg/m as calculated from the following:   Height as of this encounter: 6' (1.829 m).   Weight as of this encounter: 99.9 kg. Advance diet Up with therapy D/C IV fluids   Patient's anticipated LOS is less than 2 midnights, meeting these requirements: - Younger than 35 - Lives within 1 hour of care - Has a competent adult at home to recover with post-op recover - NO history of  - Chronic pain requiring opiods  - Diabetes  - Coronary Artery Disease  - Heart failure  - Heart attack  - Stroke  - DVT/VTE  - Respiratory Failure/COPD  - Renal failure  - Anemia  - Advanced Liver disease  DVT Prophylaxis - Xarelto Weight bearing as tolerated. D/C O2 and pulse ox and try on room air. Hemovac pulled without difficulty, will continue  therapy today.  Plan is to go Home after hospital stay. Plan for discharge later today after two sessions of PT if meeting goals.  Scheduled for outpatient therapy at [facility]). Follow-up in the office in 2 weeks.    Theresa Duty, PA-C Orthopedic Surgery [phone] 12/24/2019, 6:57 AM

## 2019-12-24 NOTE — Plan of Care (Signed)
Pt ready to DC home 

## 2019-12-24 NOTE — Progress Notes (Signed)
When vital signs were taken, nurse tech reported to me that the patient's heart rate was fluctuating from the high 90's up to 137 bpm.  During assessment of the patient: patient alert, no chest pain, and no respiratory distress. Patient's heart rate: irregular. Other vital signs were normal. Patient has a permanent pacemaker. Apical pulse: 102.  Delegated to nurse tech to get an EKG of the patient.  Called Danae Orleans, PA to report irregular heart rate and to ask if patient needs to be on telemetry.  Response from PA: "No need to be on telemetry. Continue to monitor patient." Patient stated that heart rate tends to fluctuate if he doesn't take Metoprolol during his dinner time (6 pm/7pm).  Will continue to monitor patient.

## 2019-12-25 ENCOUNTER — Encounter: Payer: Self-pay | Admitting: *Deleted

## 2019-12-25 NOTE — Discharge Summary (Signed)
Physician Discharge Summary   Patient ID: Brian Davidson MRN: [id number] DOB/AGE: 1936/07/18 84 y.o.  Admit date: 12/23/2019 Discharge date: 12/24/2019  Primary Diagnosis: Osteoarthritis, right knee   Admission Diagnoses:  Past Medical History:  Diagnosis Date  . Arthritis   . Biliary dyskinesia   . Carotid artery disease ([place]) 07/01/2016  . Dyslipidemia 07/13/2017  . Dysrhythmia    a fib  . GERD (gastroesophageal reflux disease)    occasional  . H/O measles   . History of chicken pox   . History of kidney stones   . Hypertension   . Neuromuscular disorder (HCC)    neuropathy feet   . Persistent atrial fibrillation ([place])   . Pneumonia 1992  . Presence of permanent cardiac pacemaker   . Tachycardia-bradycardia ([place])    a. s/p STJ dual chamber pacemaker  . Thiamine deficiency 01/19/2018  . Valvular heart disease 07/01/2016   Discharge Diagnoses:   Principal Problem:   OA (osteoarthritis) of knee Active Problems:   Primary osteoarthritis of right knee  Estimated body mass index is 29.88 kg/m as calculated from the following:   Height as of this encounter: 6' (1.829 m).   Weight as of this encounter: 99.9 kg.  Procedure:  Procedure(s) (LRB): TOTAL KNEE ARTHROPLASTY (Right)   Consults: None  HPI: Brian Davidson is a 84 y.o. year old male with end stage OA of his right knee with progressively worsening pain and dysfunction. He has constant pain, with activity and at rest and significant functional deficits with difficulties even with ADLs. He has had extensive non-op management including analgesics, injections of cortisone and viscosupplements, and home exercise program, but remains in significant pain with significant dysfunction. Radiographs show bone on bone arthritis medial and patellofemoral. He presents now for right Total Knee Arthroplasty.    Laboratory Data: Admission on 12/23/2019, Discharged on 12/24/2019  Component Date Value Ref Range Status  . WBC  12/24/2019 14.7* 4.0 - 10.5 K/uL Final  . RBC 12/24/2019 4.60  4.22 - 5.81 MIL/uL Final  . Hemoglobin 12/24/2019 13.9  13.0 - 17.0 g/dL Final  . HCT 12/24/2019 42.9  39.0 - 52.0 % Final  . MCV 12/24/2019 93.3  80.0 - 100.0 fL Final  . MCH 12/24/2019 30.2  26.0 - 34.0 pg Final  . MCHC 12/24/2019 32.4  30.0 - 36.0 g/dL Final  . RDW 12/24/2019 13.6  11.5 - 15.5 % Final  . Platelets 12/24/2019 135* 150 - 400 K/uL Final  . nRBC 12/24/2019 0.0  0.0 - 0.2 % Final   Performed at [facility], [place] [address], [place], [place] [address]  . Sodium 12/24/2019 137  135 - 145 mmol/L Final  . Potassium 12/24/2019 4.2  3.5 - 5.1 mmol/L Final  . Chloride 12/24/2019 104  98 - 111 mmol/L Final  . CO2 12/24/2019 24  22 - 32 mmol/L Final  . Glucose, Bld 12/24/2019 157* 70 - 99 mg/dL Final   Glucose reference range applies only to samples taken after fasting for at least 8 hours.  . BUN 12/24/2019 26* 8 - 23 mg/dL Final  . Creatinine, Ser 12/24/2019 1.38* 0.61 - 1.24 mg/dL Final  . Calcium 12/24/2019 8.6* 8.9 - 10.3 mg/dL Final  . GFR calc non Af Amer 12/24/2019 47* >60 mL/min Final  . GFR calc Af Amer 12/24/2019 55* >60 mL/min Final  . Anion gap 12/24/2019 9  5 - 15 Final   Performed at [facility], [address]  [place], [place] [address]  Hospital Outpatient Visit on 12/19/2019  Component Date Value Ref Range Status  . SARS Coronavirus 2 12/19/2019 NEGATIVE  NEGATIVE Final   Comment: (NOTE) SARS-CoV-2 target nucleic acids are NOT DETECTED. The SARS-CoV-2 RNA is generally detectable in upper and lower respiratory specimens during the acute phase of infection. Negative results do not preclude SARS-CoV-2 infection, do not rule out co-infections with other pathogens, and should not be used as the sole basis for treatment or other patient management decisions. Negative results must be combined with clinical observations, patient history, and epidemiological  information. The expected result is Negative. Fact Sheet for Patients: SugarRoll.be Fact Sheet for Healthcare Providers: https://www.woods-mathews.com/ This test is not yet approved or cleared by the [place] FDA and  has been authorized for detection and/or diagnosis of SARS-CoV-2 by FDA under an Emergency Use Authorization (EUA). This EUA will remain  in effect (meaning this test can be used) for the duration of the COVID-19 declaration under Section 56                          4(b)(1) of the Act, 21 U.S.C. section 360bbb-3(b)(1), unless the authorization is terminated or revoked sooner. Performed at [facility], [place] [address] [place], [place] [address]   Hospital Outpatient Visit on 12/16/2019  Component Date Value Ref Range Status  . MRSA, PCR 12/16/2019 NEGATIVE  NEGATIVE Final  . Staphylococcus aureus 12/16/2019 NEGATIVE  NEGATIVE Final   Comment: (NOTE) The Xpert SA Assay (FDA approved for NASAL specimens in patients 56 years of age and older), is one component of a comprehensive surveillance program. It is not intended to diagnose infection nor to guide or monitor treatment. Performed at [facility], [place] [address] [place], [place] [address]   . aPTT 12/16/2019 40* 24 - 36 seconds Final   Comment:        IF BASELINE aPTT IS ELEVATED, SUGGEST PATIENT RISK ASSESSMENT BE USED TO DETERMINE APPROPRIATE ANTICOAGULANT THERAPY. Performed at [facility], [place] [address], [place], [place] [address]   . WBC 12/16/2019 6.1  4.0 - 10.5 K/uL Final  . RBC 12/16/2019 5.00  4.22 - 5.81 MIL/uL Final  . Hemoglobin 12/16/2019 14.9  13.0 - 17.0 g/dL Final  . HCT 12/16/2019 45.8  39.0 - 52.0 % Final  . MCV 12/16/2019 91.6  80.0 - 100.0 fL Final  . MCH 12/16/2019 29.8  26.0 - 34.0 pg Final  . MCHC 12/16/2019 32.5  30.0 - 36.0 g/dL Final  . RDW 12/16/2019 13.5  11.5 - 15.5 % Final  . Platelets  12/16/2019 164  150 - 400 K/uL Final  . nRBC 12/16/2019 0.0  0.0 - 0.2 % Final   Performed at [facility], [place] [address], [place],  [address]  . Sodium 12/16/2019 139  135 - 145 mmol/L Final  . Potassium 12/16/2019 4.4  3.5 - 5.1 mmol/L Final  . Chloride 12/16/2019 104  98 - 111 mmol/L Final  . CO2 12/16/2019 26  22 - 32 mmol/L Final  . Glucose, Bld 12/16/2019 96  70 - 99 mg/dL Final   Glucose reference range applies only to samples taken after fasting for at least 8 hours.  . BUN 12/16/2019 22  8 - 23 mg/dL Final  . Creatinine, Ser 12/16/2019 1.26* 0.61 - 1.24 mg/dL Final  . Calcium 12/16/2019 9.4  8.9 - 10.3 mg/dL Final  . Total Protein 12/16/2019  7.0  6.5 - 8.1 g/dL Final  . Albumin 12/16/2019 3.9  3.5 - 5.0 g/dL Final  . AST 12/16/2019 21  15 - 41 U/L Final  . ALT 12/16/2019 17  0 - 44 U/L Final  . Alkaline Phosphatase 12/16/2019 64  38 - 126 U/L Final  . Total Bilirubin 12/16/2019 0.7  0.3 - 1.2 mg/dL Final  . GFR calc non Af Amer 12/16/2019 53* >60 mL/min Final  . GFR calc Af Amer 12/16/2019 >60  >60 mL/min Final  . Anion gap 12/16/2019 9  5 - 15 Final   Performed at [facility], [place] [address] [place], [place] [address]  . Prothrombin Time 12/16/2019 17.6* 11.4 - 15.2 seconds Final  . INR 12/16/2019 1.5* 0.8 - 1.2 Final   Comment: (NOTE) INR goal varies based on device and disease states. Performed at [facility], [place] [address], [place], [place] [address]   . ABO/RH(D) 12/16/2019 A POS   Final  . Antibody Screen 12/16/2019 NEG   Final  . Sample Expiration 12/16/2019 12/26/2019,2359   Final  . Extend sample reason 12/16/2019    Final                   Value:NO TRANSFUSIONS OR PREGNANCY IN THE PAST 3 MONTHS Performed at [facility], [place] [address] [place], [place] [address]   . ABO/RH(D) 12/16/2019    Final                   Value:A POS Performed at [facility],  [place] [address], [place], [place] [address]   Clinical Support on 12/03/2019  Component Date Value Ref Range Status  . Date Time Interrogation Session 12/03/2019 [id number]   Final  . Pulse Generator Manufacturer 12/03/2019 SJCR   Final  . Pulse Gen Model 12/03/2019 1210 [employer] SR RF   Final  . Pulse Gen Serial Number 12/03/2019 [id number]   Final  . Clinic Name 12/03/2019 [facility]   Final  . Implantable Pulse Generator Type 12/03/2019 Implantable Pulse Generator   Final  . Implantable Pulse Generator Implan* 12/03/2019 [id number]   Final  . Implantable Lead Manufacturer 12/03/2019 SJCR   Final  . Implantable Lead Model 12/03/2019 1948 IsoFlex Optim   Final  . Implantable Lead Serial Number 12/03/2019 [id number]   Final  . Implantable Lead Implant Date 12/03/2019 [id number]   Final  . Implantable Lead Location Detail 1 12/03/2019 APEX   Final  . Implantable Lead Location 12/03/2019 [id number]   Final  . Lead Channel Setting Sensing Sensi* 12/03/2019 2.0  mV Final  . Lead Channel Setting Sensing Adapt* 12/03/2019 Fixed Pacing   Final  . Lead Channel Setting Pacing Pulse * 12/03/2019 0.4  ms Final  . Lead Channel Setting Pacing Amplit* 12/03/2019 2.5  V Final  . Lead Channel Status 12/03/2019 NULL   Final  . Lead Channel Impedance Value 12/03/2019 660  ohm Final  . Lead Channel Sensing Intrinsic Amp* 12/03/2019 12.0  mV Final  . Lead Channel Pacing Threshold Ampl* 12/03/2019 0.75  V Final  . Lead Channel Pacing Threshold Puls* 12/03/2019 0.4  ms Final  . Battery Status 12/03/2019 MOS   Final  . Battery Remaining Longevity 12/03/2019 125  mo Final  . Battery Remaining Percentage 12/03/2019 91.0  % Final  . Battery Voltage 12/03/2019 2.95  V Final  . Brady Statistic RV Percent Paced 12/03/2019 10.0  % Final  . Eval Rhythm  12/03/2019 AF, CVR   Final  Lab on 11/29/2019  Component Date Value Ref Range Status  . INR 11/29/2019 1.2*  Final   Comment: Reference Range                      0.9-1.1 Moderate-intensity Warfarin Therapy 2.0-3.0 Higher-intensity Warfarin Therapy   3.0-4.0  .   Marland Kitchen Prothrombin Time 11/29/2019 12.0* 9.0 - 11.5 sec Final   Comment: For additional information, please refer to http://education.questdiagnostics.com/faq/FAQ104 (This link is being provided for informational/ educational purposes only.)   . Hgb A1c MFr Bld 11/29/2019 5.6  <5.7 % of total Hgb Final   Comment: For the purpose of screening for the presence of diabetes: . <5.7%       Consistent with the absence of diabetes 5.7-6.4%    Consistent with increased risk for diabetes             (prediabetes) > or =6.5%  Consistent with diabetes . This assay result is consistent with a decreased risk of diabetes. . Currently, no consensus exists regarding use of hemoglobin A1c for diagnosis of diabetes in children. . According to American Diabetes Association (ADA) guidelines, hemoglobin A1c <7.0% represents optimal control in non-pregnant diabetic patients. Different metrics may apply to specific patient populations.  Standards of Medical Care in Diabetes(ADA). .   . Mean Plasma Glucose 11/29/2019 114  (calc) Final  . eAG (mmol/L) 11/29/2019 6.3  (calc) Final  . Color, UA 11/29/2019 dark yellow   Final  . Clarity, UA 11/29/2019 clear   Final  . Glucose, UA 11/29/2019 Negative  Negative Final  . Bilirubin, UA 11/29/2019 Negative   Final  . Ketones, UA 11/29/2019 Negative   Final  . Spec Grav, UA 11/29/2019 1.015  1.010 - 1.025 Final  . Blood, UA 11/29/2019 Negative   Final  . pH, UA 11/29/2019 6.5  5.0 - 8.0 Final  . Protein, UA 11/29/2019 Positive* Negative Final   15  . Urobilinogen, UA 11/29/2019 1.0  0.2 or 1.0 E.U./dL Final  . Nitrite, UA 11/29/2019 Negative   Final  . Leukocytes, UA 11/29/2019 Negative  Negative Final  Office Visit on 11/18/2019  Component Date Value Ref Range Status  . WBC 11/18/2019 6.2  4.0 - 10.5 K/uL Final  . RBC 11/18/2019 4.97  4.22 - 5.81 Mil/uL  Final  . Platelets 11/18/2019 157.0  150.0 - 400.0 K/uL Final  . Hemoglobin 11/18/2019 15.1  13.0 - 17.0 g/dL Final  . HCT 11/18/2019 44.9  39.0 - 52.0 % Final  . MCV 11/18/2019 90.4  78.0 - 100.0 fl Final  . MCHC 11/18/2019 33.7  30.0 - 36.0 g/dL Final  . RDW 11/18/2019 13.9  11.5 - 15.5 % Final  . Sodium 11/18/2019 140  135 - 145 mEq/L Final  . Potassium 11/18/2019 4.1  3.5 - 5.1 mEq/L Final  . Chloride 11/18/2019 104  96 - 112 mEq/L Final  . CO2 11/18/2019 28  19 - 32 mEq/L Final  . Glucose, Bld 11/18/2019 98  70 - 99 mg/dL Final  . BUN 11/18/2019 23  6 - 23 mg/dL Final  . Creatinine, Ser 11/18/2019 1.31  0.40 - 1.50 mg/dL Final  . Total Bilirubin 11/18/2019 1.0  0.2 - 1.2 mg/dL Final  . Alkaline Phosphatase 11/18/2019 72  39 - 117 U/L Final  . AST 11/18/2019 17  0 - 37 U/L Final  . ALT 11/18/2019 13  0 - 53 U/L Final  . Total Protein 11/18/2019  6.7  6.0 - 8.3 g/dL Final  . Albumin 11/18/2019 3.8  3.5 - 5.2 g/dL Final  . GFR 11/18/2019 52.36* >60.00 mL/min Final  . Calcium 11/18/2019 9.3  8.4 - 10.5 mg/dL Final  . Cholesterol 11/18/2019 160  0 - 200 mg/dL Final   ATP III Classification       Desirable:  < 200 mg/dL               Borderline High:  200 - 239 mg/dL          High:  > = 240 mg/dL  . Triglycerides 11/18/2019 99.0  0.0 - 149.0 mg/dL Final   Normal:  <150 mg/dLBorderline High:  150 - 199 mg/dL  . HDL 11/18/2019 32.70* >39.00 mg/dL Final  . VLDL 11/18/2019 19.8  0.0 - 40.0 mg/dL Final  . LDL Cholesterol 11/18/2019 108* 0 - 99 mg/dL Final  . Total CHOL/HDL Ratio 11/18/2019 5   Final                  Men          Women1/2 Average Risk     3.4          3.3Average Risk          5.0          4.42X Average Risk          9.6          7.13X Average Risk          15.0          11.0                      . NonHDL 11/18/2019 127.53   Final   NOTE:  Non-HDL goal should be 30 mg/dL higher than patient's LDL goal (i.e. LDL goal of < 70 mg/dL, would have non-HDL goal of < 100 mg/dL)  .  Vitamin B1 (Thiamine) 11/18/2019 8  8 - 30 nmol/L Final   Comment: Marland Kitchen Vitamin supplementation within 24 hours prior to blood draw may affect the accuracy of the results. . This test was developed and its analytical performance characteristics have been determined by [facility], [place]. It has not been cleared or approved by the U.S. Food and Drug Administration. This assay has been validated pursuant to the CLIA regulations and is used for clinical purposes. .   . TSH 11/18/2019 2.36  0.35 - 4.50 uIU/mL Final     X-Rays:CUP PACEART REMOTE DEVICE CHECK  Result Date: 12/03/2019 Scheduled remote reviewed. Normal device function.  Takes Xarelto. Next remote 91 days. Felisa Bonier, RN, MSN   EKG: Orders placed or performed in visit on 12/10/19  . EKG 12-Lead     Hospital Course: Brian Davidson is a 84 y.o. who was admitted to [facility]. They were brought to the operating room on 12/23/2019 and underwent Procedure(s): TOTAL KNEE ARTHROPLASTY.  Patient tolerated the procedure well and was later transferred to the recovery room and then to the orthopaedic floor for postoperative care. They were given PO and IV analgesics for pain control following their surgery. They were given 24 hours of postoperative antibiotics of  Anti-infectives (From admission, onward)   Start     Dose/Rate Route Frequency Ordered Stop   12/23/19 1400  ceFAZolin (ANCEF) IVPB 2g/100 mL premix     2 g 200 mL/hr over 30 Minutes Intravenous Every 6 hours 12/23/19 0948 12/23/19 2038  12/23/19 0600  ceFAZolin (ANCEF) IVPB 2g/100 mL premix     2 g 200 mL/hr over 30 Minutes Intravenous On call to O.R. 12/23/19 [id number] 12/23/19 0726     and started on DVT prophylaxis in the form of Xarelto.   PT and OT were ordered for total joint protocol. Discharge planning consulted to help with postop disposition and equipment needs.  Patient had a good night on the evening of surgery. They started  to get up OOB with therapy on POD #0. Pt was seen during rounds and was ready to go home pending progress with therapy. Hemovac drain was pulled without difficulty. He worked with therapy on POD #1 and was meeting his goals. Pt was discharged to home later that day in stable condition.  Diet: Cardiac diet Activity: WBAT Follow-up: in 2 weeks Disposition: [facility] with [facility]) Discharged Condition: stable   Discharge Instructions    Call MD / Call 911   Complete by: As directed    If you experience chest pain or shortness of breath, CALL 911 and be transported to the hospital emergency room.  If you develope a fever above 101 F, pus (white drainage) or increased drainage or redness at the wound, or calf pain, call your surgeon's office.   Change dressing   Complete by: As directed    You may remove the bulky bandage (ACE wrap and gauze) two days after surgery. You will have an adhesive waterproof bandage underneath. Leave this in place until your first follow-up appointment.   Constipation Prevention   Complete by: As directed    Drink plenty of fluids.  Prune juice may be helpful.  You may use a stool softener, such as Colace (over the counter) 100 mg twice a day.  Use MiraLax (over the counter) for constipation as needed.   Diet - low sodium heart healthy   Complete by: As directed    Do not put a pillow under the knee. Place it under the heel.   Complete by: As directed    Driving restrictions   Complete by: As directed    No driving for two weeks   TED hose   Complete by: As directed    Use stockings (TED hose) for three weeks on both leg(s).  You may remove them at night for sleeping.   Weight bearing as tolerated   Complete by: As directed      Allergies as of 12/24/2019      Reactions   Cardizem [diltiazem]    Rash from Yellow dye in generic capsule. Can take different brand. Takes diltiazem - his allergy is to Cardizem   Diltiazem Hcl    Rash from red  dye in generic capsule. Can take different brand. Takes diltiazem - his allergy is to Cardizem Rash from red dye in generic capsule. Can take different brand. Takes diltiazem - his allergy is to Cardizem   Sulfa Antibiotics    Unknown childhood reaction      Medication List    TAKE these medications   acetaminophen 500 MG tablet Commonly known as: TYLENOL Take 1,000 mg by mouth every 6 (six) hours as needed for headache.   Biotin 5000 MCG Tabs Take 5,000 mg by mouth daily.   CALTRATE 600+D PO Take 600 mg by mouth daily.   Cholecalciferol 50 MCG (2000 UT) Tabs Take 2,000 Units by mouth daily.   diltiazem 240 MG 24 hr capsule Commonly known as: CARDIZEM CD TAKE ONE (1)  CAPSULE EACH DAY BY MOUTH   gabapentin 300 MG capsule Commonly known as: NEURONTIN Take 1 capsule (300 mg total) by mouth 2 (two) times daily.   irbesartan-hydrochlorothiazide 150-12.5 MG tablet Commonly known as: AVALIDE Take 1/2 (one-half) tablet by mouth once daily   loratadine 10 MG tablet Commonly known as: CLARITIN Take 10 mg by mouth daily as needed for allergies.   magnesium oxide 400 MG tablet Commonly known as: MAG-OX Take 400 mg by mouth daily.   methocarbamol 500 MG tablet Commonly known as: ROBAXIN Take 1 tablet (500 mg total) by mouth every 6 (six) hours as needed for muscle spasms.   metoprolol tartrate 100 MG tablet Commonly known as: LOPRESSOR Take 1 tablet by mouth twice daily   oxyCODONE 5 MG immediate release tablet Commonly known as: Oxy IR/ROXICODONE Take 1-2 tablets (5-10 mg total) by mouth every 6 (six) hours as needed for severe pain. Not to exceed 6 tablets a day.   rivaroxaban 20 MG Tabs tablet Commonly known as: Xarelto Take 1 tablet (20 mg total) by mouth daily.   thiamine 100 MG tablet Take 100 mg by mouth daily.   traMADol 50 MG tablet Commonly known as: ULTRAM Take 1-2 tablets (50-100 mg total) by mouth every 6 (six) hours as needed for moderate pain.     Turmeric 500 MG Tabs Take 500 mg by mouth daily.   VITAMIN B 12 PO Take 5,000 mcg by mouth daily.   Voltaren 1 % Gel Generic drug: diclofenac Sodium Apply topically daily as needed (Pain).            Discharge Care Instructions  (From admission, onward)         Start     Ordered   12/24/19 0000  Weight bearing as tolerated     12/24/19 0704   12/24/19 0000  Change dressing    Comments: You may remove the bulky bandage (ACE wrap and gauze) two days after surgery. You will have an adhesive waterproof bandage underneath. Leave this in place until your first follow-up appointment.   12/24/19 [id number]         Follow-up Information    Gaynelle Arabian, MD. Schedule an appointment as soon as possible for a visit on 01/07/2020.   Specialty: Orthopedic Surgery Contact information: [address] [place] [address] [phone]           Signed: Theresa Duty, PA-C Orthopedic Surgery 12/25/2019, 9:02 AM

## 2019-12-27 ENCOUNTER — Encounter: Payer: Self-pay | Admitting: Physical Therapy

## 2019-12-27 ENCOUNTER — Ambulatory Visit: Payer: PPO | Attending: Student | Admitting: Physical Therapy

## 2019-12-27 ENCOUNTER — Other Ambulatory Visit: Payer: Self-pay

## 2019-12-27 DIAGNOSIS — M25561 Pain in right knee: Secondary | ICD-10-CM | POA: Diagnosis not present

## 2019-12-27 DIAGNOSIS — M6281 Muscle weakness (generalized): Secondary | ICD-10-CM | POA: Diagnosis not present

## 2019-12-27 DIAGNOSIS — M25661 Stiffness of right knee, not elsewhere classified: Secondary | ICD-10-CM

## 2019-12-27 DIAGNOSIS — R262 Difficulty in walking, not elsewhere classified: Secondary | ICD-10-CM | POA: Diagnosis not present

## 2019-12-27 NOTE — Therapy (Signed)
[facility] [address]  [place], [place], [address] Phone: [phone]   Fax:  [phone]  Physical Therapy Evaluation  Patient Details  Name: TURRELL LOAIZA MRN: [id number] Date of Birth: 1935-10-31 Referring Provider (PT): Theresa Duty, [place]   Encounter Date: 12/27/2019  PT End of Session - 12/27/19 1119    Visit Number  1    Number of Visits  13    Date for PT Re-Evaluation  01/24/20    Authorization Type  [employer]    PT Start Time  1017    PT Stop Time  1102    PT Time Calculation (min)  45 min    Activity Tolerance  Patient tolerated treatment well;Patient limited by pain    Behavior During Therapy  [facility] for tasks assessed/performed       Past Medical History:  Diagnosis Date  . Arthritis   . Biliary dyskinesia   . Carotid artery disease ([place]) 07/01/2016  . Dyslipidemia 07/13/2017  . Dysrhythmia    a fib  . GERD (gastroesophageal reflux disease)    occasional  . H/O measles   . History of chicken pox   . History of kidney stones   . Hypertension   . Neuromuscular disorder (HCC)    neuropathy feet   . Persistent atrial fibrillation ([place])   . Pneumonia 1992  . Presence of permanent cardiac pacemaker   . Tachycardia-bradycardia ([place])    a. s/p STJ dual chamber pacemaker  . Thiamine deficiency 01/19/2018  . Valvular heart disease 07/01/2016    Past Surgical History:  Procedure Laterality Date  . CHOLECYSTECTOMY N/A 11/24/2015   Procedure: LAPAROSCOPIC CHOLECYSTECTOMY;  Surgeon: Greer Pickerel, MD;  Location: [facility];  Service: General;  Laterality: N/A;  . CYSTOSCOPY  2016  . EYE SURGERY     bil cataract  . FRACTURE SURGERY  2005   left ankle  . HERNIA REPAIR     left groin, no mesh  . PERMANENT PACEMAKER INSERTION N/A 01/31/2012   SJM Accent SR RF implanted by DR Allred  . TOTAL KNEE ARTHROPLASTY Right 12/23/2019   Procedure: TOTAL KNEE ARTHROPLASTY;  Surgeon: Gaynelle Arabian, MD;   Location: [facility];  Service: Orthopedics;  Laterality: Right;  66min    There were no vitals filed for this visit.   Subjective Assessment - 12/27/19 1018    Subjective  Patient reports undergoing R TKA on 12/23/19. Been using 2WW since surgery and feels steady with it. Pain levels have been tolerable with pain meds. Pain is worse over the medial aspect of the knee. Has been trying to walk a little every hour at home. Does not have stairs at home. Has been icing the knee several times day and wearing ted hose. Denies calf tenderness/tightness. Has been working on the HEP given to him at the hospital.    Patient is accompained by:  Family member   Wife, [name]   Pertinent History  pacemaker, tachycardia/bradycardia, a-fib, kidney stone, HTN, GERD, HLD, CAD, hernie repair, L ankle fx surgery    Limitations  Standing;Lifting;Walking;House hold activities    How long can you stand comfortably?  8 minutes with walker    How long can you walk comfortably?  ~5 minutes with walker    Diagnostic tests  none recent    Patient Stated Goals  work on ROM    Currently in Pain?  Yes    Pain Score  2  Pain Location  Knee    Pain Orientation  Right    Pain Descriptors / Indicators  Dull    Pain Type  Acute pain;Surgical pain         [facility] PT Assessment - 12/27/19 1027      Assessment   Medical Diagnosis  Osteoarthritis of R knee joint, s/p R TKA    Referring Provider (PT)  Theresa Duty, PA    Onset Date/Surgical Date  12/23/19    Next MD Visit  01/07/20    Prior Therapy  yes- for back      Precautions   Precautions  ICD/Pacemaker      Balance Screen   Has the patient fallen in the past 6 months  No    Has the patient had a decrease in activity level because of a fear of falling?   No    Is the patient reluctant to leave their home because of a fear of falling?   No      Home Social worker  Private residence    Living Arrangements  Spouse/significant other     Available Help at Discharge  Family    Type of [place]  One level    [place] - 2 wheels;[place] - single point      Prior Function   Level of Independence  Independent    Vocation  Retired    Leisure  work outdoors, Editor, commissioning Status  Within [employer] for tasks assessed      Observation/Other Assessments   Observations  R knee incision covered by bandage; diffuse moderate swelling over R knee    Focus on Therapeutic Outcomes (FOTO)   Knee: 40 (60% limited, 27% predicted)      Sensation   Light Touch  Appears Intact      Coordination   Gross Motor Movements are Fluid and Coordinated  Yes      Posture/Postural Control   Posture/Postural Control  Postural limitations    Postural Limitations  Weight shift left;Rounded Shoulders;Forward head      ROM / Strength   AROM / PROM / Strength  AROM;Strength;PROM      AROM   AROM Assessment Site  Knee    Right/Left Knee  Left;Right    Right Knee Extension  10    Right Knee Flexion  70    Left Knee Extension  2    Left Knee Flexion  130      PROM   PROM Assessment Site  Knee    Right/Left Knee  Left;Right    Right Knee Extension  10   pain   Right Knee Flexion  80   pain   Left Knee Extension  1    Left Knee Flexion  134      Strength   Strength Assessment Site  Hip;Knee;Ankle    Right/Left Hip  Right;Left    Right Hip Flexion  3/5    Right Hip ABduction  4-/5    Right Hip ADduction  4-/5    Left Hip Flexion  4/5    Left Hip ABduction  4/5    Left Hip ADduction  4/5    Right/Left Knee  Right;Left    Right Knee Flexion  3+/5    Right Knee Extension  3/5    Left Knee Flexion  4+/5    Left Knee Extension  4+/5    Right/Left Ankle  Right;Left    Right Ankle Dorsiflexion  4+/5    Right Ankle Plantar Flexion  3+/5    Left Ankle Dorsiflexion  4+/5    Left Ankle Plantar Flexion  4+/5      Flexibility   Soft Tissue  Assessment /Muscle Length  yes    Hamstrings  moderately tight R LE    Quadriceps  severely tight R LE in mod thomas      Palpation   Patella mobility  assessment on R limited d/t edema; L knee WFL     Palpation comment  TTP over B joint lines on R knee; calf supple & nontender      Ambulation/Gait   Assistive device  Rolling walker    Gait Pattern  Step-to pattern;Step-through pattern;Decreased hip/knee flexion - right;Decreased stance time - left;Decreased step length - right;Decreased weight shift to right;Trunk flexed    Ambulation Surface  Level;Indoor    Gait velocity  decreased                Objective measurements completed on examination: See above findings.              PT Education - 12/27/19 1118    Education Details  prognosis, POC, HEP, edu on post-op precautions and incision care    Person(s) Educated  Patient;Spouse    Methods  Explanation;Demonstration;Tactile cues;Verbal cues;Handout    Comprehension  Verbalized understanding;Returned demonstration       PT Short Term Goals - 12/27/19 1124      PT SHORT TERM GOAL #1   Title  Patient to be independent with initial HEP.    Time  2    Period  Weeks    Status  New    Target Date  01/10/20        PT Long Term Goals - 12/27/19 1124      PT LONG TERM GOAL #1   Title  Patient to be independent with advanced HEP.    Time  4    Period  Weeks    Status  New    Target Date  01/24/20      PT LONG TERM GOAL #2   Title  Patient to demonstrate R knee AROM/PROM 0-120 degrees.    Time  4    Period  Weeks    Status  New    Target Date  01/24/20      PT LONG TERM GOAL #3   Title  Patient to demonstrate B LE strength >/=4+/5.    Time  4    Period  Weeks    Status  New    Target Date  01/24/20      PT LONG TERM GOAL #4   Title  Patient to report tolerance of walking 1 mile without pain limiting.    Time  4    Period  Weeks    Status  New    Target Date  01/24/20      PT LONG TERM  GOAL #5   Title  Patient to demonstrate symmetrical step length, knee flexion, and weight shift with LRAD.    Time  4    Period  Weeks    Status  New    Target Date  01/24/20             Plan - 12/27/19 1119    Clinical Impression Statement  Patient is an  83y/o M presenting with wife to [facility] with c/o R knee pain s/p R TKA on 12/23/19. Currently ambulating with rolling walker. Pain is well-managed with meds, and typically occurs over the medial aspect of the R knee. Has been trying to be active at home and performing HEP given to him at the hospital. Denies calf tenderness/tightness. Patient today presenting with forward head and rounded shoulders, limited and painful R knee ROM, decreased R LE strength, R HS and quad tightens, TTP over B joint lines of R knee, and gait deviations. Patient and wife educated on gentle stretching and strengthening HEP- both reported understanding. Would benefit form skilled PT services 3x/week for 4 weeks to address aforementioned impairments.    Personal Factors and Comorbidities  Sex;Age;Comorbidity 3+;Fitness;Past/Current Experience;Time since onset of injury/illness/exacerbation    Comorbidities  pacemaker, tachycardia/bradycardia, a-fib, kidney stone, HTN, GERD, HLD, CAD, hernie repair, L ankle fx surgery    Examination-Activity Limitations  Sit;Bed Mobility;Sleep;Bend;Squat;Stairs;Carry;Stand;Toileting;Dressing;Transfers;Hygiene/Grooming;Locomotion Level;Lift    Examination-Participation Restrictions  Church;Cleaning;Shop;Community Activity;Driving;Yard Work;Interpersonal Relationship;Laundry;Meal Prep    Stability/Clinical Decision Making  Stable/Uncomplicated    Clinical Decision Making  Low    Rehab Potential  Good    PT Frequency  3x / week    PT Duration  4 weeks    PT Treatment/Interventions  ADLs/Self Care Home Management;Cryotherapy;Moist Heat;Balance training;Therapeutic exercise;Therapeutic activities;Functional mobility training;Stair  training;Gait training;Neuromuscular re-education;Patient/family education;Manual techniques;Vasopneumatic Device;Taping;Energy conservation;Dry needling;Passive range of motion;Scar mobilization    PT Next Visit Plan  reassess HEP; progress R knee ROM and LE strengthening    Consulted and Agree with Plan of Care  Patient       Patient will benefit from skilled therapeutic intervention in order to improve the following deficits and impairments:  Decreased endurance, Hypomobility, Increased edema, Decreased scar mobility, Decreased activity tolerance, Decreased strength, Pain, Increased fascial restricitons, Decreased balance, Difficulty walking, Increased muscle spasms, Improper body mechanics, Decreased range of motion, Impaired flexibility, Postural dysfunction  Visit Diagnosis: Acute pain of right knee  Stiffness of right knee, not elsewhere classified  Muscle weakness (generalized)  Difficulty in walking, not elsewhere classified     Problem List Patient Active Problem List   Diagnosis Date Noted  . Primary osteoarthritis of right knee 12/23/2019  . Symptomatic bradycardia 05/31/2019  . Vertigo 07/27/2018  . Multiple closed fractures of ribs of right side 01/21/2018  . Thiamine deficiency 01/19/2018  . Peripheral neuropathy 07/13/2017  . Dyslipidemia 07/13/2017  . Preventative health care 01/11/2017  . Carotid artery disease ([place]) 07/01/2016  . Mitral valve disorder 07/01/2016  . Low back pain 12/31/2015  . Osteopenia 12/31/2015  . Allergic state 12/31/2015  . Skin lesion 12/31/2015  . History of chicken pox   . Calculi, ureter 03/30/2015  . OA (osteoarthritis) of knee 05/10/2014  . Pacemaker-[employer] 02/01/2012  . Permanent atrial fibrillation ([place])   . Hypertension   . Heart murmur   . Chronic anticoagulation      Janene Harvey, PT, DPT 12/27/19 11:27 AM   [facility] [address]  [place], [place], [address] Phone: [phone]   Fax:  [phone]  Name: ROHN WELDEN MRN: [id number] Date of Birth: [birth year]

## 2019-12-30 ENCOUNTER — Ambulatory Visit: Payer: PPO

## 2019-12-30 ENCOUNTER — Other Ambulatory Visit: Payer: Self-pay

## 2019-12-30 DIAGNOSIS — M6281 Muscle weakness (generalized): Secondary | ICD-10-CM

## 2019-12-30 DIAGNOSIS — M25661 Stiffness of right knee, not elsewhere classified: Secondary | ICD-10-CM

## 2019-12-30 DIAGNOSIS — M25561 Pain in right knee: Secondary | ICD-10-CM

## 2019-12-30 DIAGNOSIS — R262 Difficulty in walking, not elsewhere classified: Secondary | ICD-10-CM

## 2019-12-30 NOTE — Therapy (Signed)
[facility] [address]  [place], [place], [address] Phone: [phone]   Fax:  [phone]  Physical Therapy Treatment  Patient Details  Name: Brian Davidson MRN: [id number] Date of Birth: [birth year] Referring Provider (PT): Theresa Duty, [place]   Encounter Date: 12/30/2019  PT End of Session - 12/30/19 [id number]    Visit Number  2    Number of Visits  13    Date for PT Re-Evaluation  01/24/20    Authorization Type  [employer]    PT Start Time  0932    PT Stop Time  1020    PT Time Calculation (min)  48 min    Activity Tolerance  Patient tolerated treatment well;Patient limited by pain    Behavior During Therapy  [facility] for tasks assessed/performed       Past Medical History:  Diagnosis Date  . Arthritis   . Biliary dyskinesia   . Carotid artery disease ([place]) 07/01/2016  . Dyslipidemia 07/13/2017  . Dysrhythmia    a fib  . GERD (gastroesophageal reflux disease)    occasional  . H/O measles   . History of chicken pox   . History of kidney stones   . Hypertension   . Neuromuscular disorder (HCC)    neuropathy feet   . Persistent atrial fibrillation ([place])   . Pneumonia 1992  . Presence of permanent cardiac pacemaker   . Tachycardia-bradycardia ([place])    a. s/p STJ dual chamber pacemaker  . Thiamine deficiency 01/19/2018  . Valvular heart disease 07/01/2016    Past Surgical History:  Procedure Laterality Date  . CHOLECYSTECTOMY N/A 11/24/2015   Procedure: LAPAROSCOPIC CHOLECYSTECTOMY;  Surgeon: Greer Pickerel, MD;  Location: [facility];  Service: General;  Laterality: N/A;  . CYSTOSCOPY  2016  . EYE SURGERY     bil cataract  . FRACTURE SURGERY  2005   left ankle  . HERNIA REPAIR     left groin, no mesh  . PERMANENT PACEMAKER INSERTION N/A 01/31/2012   SJM Accent SR RF implanted by DR Allred  . TOTAL KNEE ARTHROPLASTY Right 12/23/2019   Procedure: TOTAL KNEE ARTHROPLASTY;  Surgeon: Gaynelle Arabian, MD;   Location: [facility];  Service: Orthopedics;  Laterality: Right;  64min    There were no vitals filed for this visit.  Subjective Assessment - 12/30/19 0937    Subjective  Pt. reporting he has been performing HEP.    Pertinent History  pacemaker, tachycardia/bradycardia, a-fib, kidney stone, HTN, GERD, HLD, CAD, hernie repair, L ankle fx surgery    Diagnostic tests  none recent    Patient Stated Goals  work on ROM    Currently in Pain?  Yes    Pain Score  5     Pain Location  Knee    Pain Orientation  Right    Pain Descriptors / Indicators  Dull    Pain Type  Acute pain;Surgical pain                       [facility] Adult PT Treatment/Exercise - 12/30/19 0001      Self-Care   Self-Care  Other Self-Care Comments    Other Self-Care Comments   Educated pt. and wife on proper positioning for pt. R LE when in recliner and bed as to reduce chance of HS contracture/increased tightness and for edema control       Knee/Hip Exercises: Stretches   Passive Hamstring Stretch  Right;2 reps;30 seconds    Passive Hamstring Stretch Limitations  supine with strap     Hip Flexor Stretch  Right;1 rep;30 seconds    Hip Flexor Stretch Limitations  mod thomas hip flexor/quad stretch with strap       Knee/Hip Exercises: Aerobic   Nustep  Lvl 1, 6 min (UE/EL)      Knee/Hip Exercises: Supine   Quad Sets  Right;10 reps;Strengthening    Quad Sets Limitations  pillow under calf     Heel Slides  Right;AAROM;10 reps    Heel Slides Limitations  with strap     Straight Leg Raises  Right;10 reps;Strengthening      Vasopneumatic   Number Minutes Vasopneumatic   10 minutes    Vasopnuematic Location   Knee   R   Vasopneumatic Pressure  Low    Vasopneumatic Temperature   coldest temp.  4               PT Short Term Goals - 12/30/19 0939      PT SHORT TERM GOAL #1   Title  Patient to be independent with initial HEP.    Time  2    Period  Weeks    Status  On-going    Target Date   01/10/20        PT Long Term Goals - 12/30/19 0939      PT LONG TERM GOAL #1   Title  Patient to be independent with advanced HEP.    Time  4    Period  Weeks    Status  On-going      PT LONG TERM GOAL #2   Title  Patient to demonstrate R knee AROM/PROM 0-120 degrees.    Time  4    Period  Weeks    Status  On-going      PT LONG TERM GOAL #3   Title  Patient to demonstrate B LE strength >/=4+/5.    Time  4    Period  Weeks    Status  On-going      PT LONG TERM GOAL #4   Title  Patient to report tolerance of walking 1 mile without pain limiting.    Time  4    Period  Weeks    Status  On-going      PT LONG TERM GOAL #5   Title  Patient to demonstrate symmetrical step length, knee flexion, and weight shift with LRAD.    Time  4    Period  Weeks    Status  On-going            Plan - 12/30/19 1240    Clinical Impression Statement  Brian Davidson and wife reporting he has been doing his HEP.  Had some difficulty at home with heel slide which was demo'd properly in session and questions answered.  Educated pt. and wife on proper R LE positioning for edema management and extension ROM.  Tolerated all gentle quad strengthening and LE ROM activities well today with reduction in pain following session.  Ended visit with ice/compression to R knee to reduce post-exercise swelling and pain.    Comorbidities  pacemaker, tachycardia/bradycardia, a-fib, kidney stone, HTN, GERD, HLD, CAD, hernie repair, L ankle fx surgery    Rehab Potential  Good    PT Frequency  3x / week    PT Treatment/Interventions  ADLs/Self Care Home Management;Cryotherapy;Moist Heat;Balance training;Therapeutic exercise;Therapeutic activities;Functional mobility training;Stair training;Gait training;Neuromuscular re-education;Patient/family education;Manual techniques;Vasopneumatic Device;Taping;Energy conservation;Dry needling;Passive  range of motion;Scar mobilization    PT Next Visit Plan  Progress R knee ROM and LE  strengthening    Consulted and Agree with Plan of Care  Patient       Patient will benefit from skilled therapeutic intervention in order to improve the following deficits and impairments:  Decreased endurance, Hypomobility, Increased edema, Decreased scar mobility, Decreased activity tolerance, Decreased strength, Pain, Increased fascial restricitons, Decreased balance, Difficulty walking, Increased muscle spasms, Improper body mechanics, Decreased range of motion, Impaired flexibility, Postural dysfunction  Visit Diagnosis: Acute pain of right knee  Stiffness of right knee, not elsewhere classified  Muscle weakness (generalized)  Difficulty in walking, not elsewhere classified     Problem List Patient Active Problem List   Diagnosis Date Noted  . Primary osteoarthritis of right knee 12/23/2019  . Symptomatic bradycardia 05/31/2019  . Vertigo 07/27/2018  . Multiple closed fractures of ribs of right side 01/21/2018  . Thiamine deficiency 01/19/2018  . Peripheral neuropathy 07/13/2017  . Dyslipidemia 07/13/2017  . Preventative health care 01/11/2017  . Carotid artery disease ([place]) 07/01/2016  . Mitral valve disorder 07/01/2016  . Low back pain 12/31/2015  . Osteopenia 12/31/2015  . Allergic state 12/31/2015  . Skin lesion 12/31/2015  . History of chicken pox   . Calculi, ureter 03/30/2015  . OA (osteoarthritis) of knee 05/10/2014  . Pacemaker-[employer] 02/01/2012  . Permanent atrial fibrillation ([place])   . Hypertension   . Heart murmur   . Chronic anticoagulation     Bess Harvest, PTA 12/30/19 12:42 PM   [facility] [address]  [place], [place], [address] Phone: [phone]   Fax:  [phone]  Name: Brian Davidson MRN: [id number] Date of Birth: February 24, 1936

## 2019-12-31 ENCOUNTER — Ambulatory Visit: Payer: PPO

## 2019-12-31 DIAGNOSIS — R262 Difficulty in walking, not elsewhere classified: Secondary | ICD-10-CM

## 2019-12-31 DIAGNOSIS — M6281 Muscle weakness (generalized): Secondary | ICD-10-CM

## 2019-12-31 DIAGNOSIS — M25561 Pain in right knee: Secondary | ICD-10-CM

## 2019-12-31 DIAGNOSIS — M25661 Stiffness of right knee, not elsewhere classified: Secondary | ICD-10-CM

## 2019-12-31 NOTE — Therapy (Signed)
[facility] [address]  [place], [place], [address] Phone: [phone]   Fax:  [phone]  Physical Therapy Treatment  Patient Details  Name: Brian Davidson MRN: [id number] Date of Birth: 17-Aug-1936 Referring Provider (PT): Theresa Duty, [place]   Encounter Date: 12/31/2019  PT End of Session - 12/31/19 1454    Visit Number  3    Number of Visits  13    Date for PT Re-Evaluation  01/24/20    Authorization Type  [employer]    PT Start Time  1447    PT Stop Time  1540    PT Time Calculation (min)  53 min    Activity Tolerance  Patient tolerated treatment well;Patient limited by pain    Behavior During Therapy  [facility] for tasks assessed/performed       Past Medical History:  Diagnosis Date  . Arthritis   . Biliary dyskinesia   . Carotid artery disease ([place]) 07/01/2016  . Dyslipidemia 07/13/2017  . Dysrhythmia    a fib  . GERD (gastroesophageal reflux disease)    occasional  . H/O measles   . History of chicken pox   . History of kidney stones   . Hypertension   . Neuromuscular disorder (HCC)    neuropathy feet   . Persistent atrial fibrillation ([place])   . Pneumonia 1992  . Presence of permanent cardiac pacemaker   . Tachycardia-bradycardia ([place])    a. s/p STJ dual chamber pacemaker  . Thiamine deficiency 01/19/2018  . Valvular heart disease 07/01/2016    Past Surgical History:  Procedure Laterality Date  . CHOLECYSTECTOMY N/A 11/24/2015   Procedure: LAPAROSCOPIC CHOLECYSTECTOMY;  Surgeon: Greer Pickerel, MD;  Location: [facility];  Service: General;  Laterality: N/A;  . CYSTOSCOPY  2016  . EYE SURGERY     bil cataract  . FRACTURE SURGERY  2005   left ankle  . HERNIA REPAIR     left groin, no mesh  . PERMANENT PACEMAKER INSERTION N/A 01/31/2012   SJM Accent SR RF implanted by DR Allred  . TOTAL KNEE ARTHROPLASTY Right 12/23/2019   Procedure: TOTAL KNEE ARTHROPLASTY;  Surgeon: Gaynelle Arabian, MD;   Location: [facility];  Service: Orthopedics;  Laterality: Right;  14min    There were no vitals filed for this visit.  Subjective Assessment - 12/31/19 1453    Subjective  Pt. reporting some "burning pain" since yesterday when knee is stretched in bent position.    Patient is accompained by:  Family member    Pertinent History  pacemaker, tachycardia/bradycardia, a-fib, kidney stone, HTN, GERD, HLD, CAD, hernie repair, L ankle fx surgery    Diagnostic tests  none recent    Patient Stated Goals  work on ROM    Currently in Pain?  Yes    Pain Score  5     Pain Location  Knee    Pain Orientation  Right    Pain Type  Acute pain;Surgical pain    Multiple Pain Sites  No                       [facility] Adult PT Treatment/Exercise - 12/31/19 0001      Knee/Hip Exercises: Stretches   Passive Hamstring Stretch  Right;2 reps;30 seconds    Passive Hamstring Stretch Limitations  supine with strap     Hip Flexor Stretch  Right;1 rep;30 seconds    Hip Flexor Stretch Limitations  mod  thomas hip flexor/quad stretch with strap       Knee/Hip Exercises: Aerobic   Nustep  Lvl 1, 6 min (UE/EL)      Knee/Hip Exercises: Supine   Quad Sets  Right;10 reps;Strengthening    Quad Sets Limitations  Pillow under calf     Straight Leg Raises  Right;10 reps;Strengthening    Knee Flexion  Right;AAROM;10 reps    Knee Flexion Limitations  with strap and heels on peanut p-ball     Other Supine Knee/Hip Exercises  Supine R ankle pumps x 20 reps with R LE elevated        Knee/Hip Exercises: Sidelying   Hip ABduction  Right;2 sets;5 reps;Strengthening    Hip ABduction Limitations  from two pillows btw knees       Vasopneumatic   Number Minutes Vasopneumatic   10 minutes    Vasopnuematic Location   Knee    Vasopneumatic Pressure  Low    Vasopneumatic Temperature   coldest temp.                 PT Short Term Goals - 12/30/19 0939      PT SHORT TERM GOAL #1   Title  Patient to be  independent with initial HEP.    Time  2    Period  Weeks    Status  On-going    Target Date  01/10/20        PT Long Term Goals - 12/30/19 0939      PT LONG TERM GOAL #1   Title  Patient to be independent with advanced HEP.    Time  4    Period  Weeks    Status  On-going      PT LONG TERM GOAL #2   Title  Patient to demonstrate R knee AROM/PROM 0-120 degrees.    Time  4    Period  Weeks    Status  On-going      PT LONG TERM GOAL #3   Title  Patient to demonstrate B LE strength >/=4+/5.    Time  4    Period  Weeks    Status  On-going      PT LONG TERM GOAL #4   Title  Patient to report tolerance of walking 1 mile without pain limiting.    Time  4    Period  Weeks    Status  On-going      PT LONG TERM GOAL #5   Title  Patient to demonstrate symmetrical step length, knee flexion, and weight shift with LRAD.    Time  4    Period  Weeks    Status  On-going            Plan - 12/31/19 1517    Clinical Impression Statement  Brian Davidson reporting some "burning discomfort" when in full knee flexion ~ 90 dg which he feels is more intense today as compared to in yesterday's PT session.  With visible increased R knee swelling and reminded pt. to not perform home program exercises on days in PT to avoid over-exertion of R knee.  Pt. and wife verbalized understanding.  Brian Davidson's R knee pain improved significantly with gentle NuStep warmup and gentle ROM following this.  Educated pt. and wife on ankle pumps to manage swelling and Brian Davidson demonstrating improved quad control with SLR today.  Pt. remains very driven and may require some guidance to avoid over-exertion of R knee in future.  Able to demo ~  90 dg R knee flexion with strap-assisted activities and with some visible improvement in extension ROM.  Ended visit with ice/compression applied to R knee to reduce swelling and pain.    Comorbidities  pacemaker, tachycardia/bradycardia, a-fib, kidney stone, HTN, GERD, HLD, CAD, hernie repair, L  ankle fx surgery    Rehab Potential  Good    PT Frequency  3x / week    PT Treatment/Interventions  ADLs/Self Care Home Management;Cryotherapy;Moist Heat;Balance training;Therapeutic exercise;Therapeutic activities;Functional mobility training;Stair training;Gait training;Neuromuscular re-education;Patient/family education;Manual techniques;Vasopneumatic Device;Taping;Energy conservation;Dry needling;Passive range of motion;Scar mobilization    PT Next Visit Plan  Progress R knee ROM and LE strengthening    Consulted and Agree with Plan of Care  Patient       Patient will benefit from skilled therapeutic intervention in order to improve the following deficits and impairments:  Decreased endurance, Hypomobility, Increased edema, Decreased scar mobility, Decreased activity tolerance, Decreased strength, Pain, Increased fascial restricitons, Decreased balance, Difficulty walking, Increased muscle spasms, Improper body mechanics, Decreased range of motion, Impaired flexibility, Postural dysfunction  Visit Diagnosis: Acute pain of right knee  Stiffness of right knee, not elsewhere classified  Muscle weakness (generalized)  Difficulty in walking, not elsewhere classified     Problem List Patient Active Problem List   Diagnosis Date Noted  . Primary osteoarthritis of right knee 12/23/2019  . Symptomatic bradycardia 05/31/2019  . Vertigo 07/27/2018  . Multiple closed fractures of ribs of right side 01/21/2018  . Thiamine deficiency 01/19/2018  . Peripheral neuropathy 07/13/2017  . Dyslipidemia 07/13/2017  . Preventative health care 01/11/2017  . Carotid artery disease ([place]) 07/01/2016  . Mitral valve disorder 07/01/2016  . Low back pain 12/31/2015  . Osteopenia 12/31/2015  . Allergic state 12/31/2015  . Skin lesion 12/31/2015  . History of chicken pox   . Calculi, ureter 03/30/2015  . OA (osteoarthritis) of knee 05/10/2014  . Pacemaker-St.Jude 02/01/2012  . Permanent atrial  fibrillation ([place])   . Hypertension   . Heart murmur   . Chronic anticoagulation     Bess Harvest, PTA 12/31/19 5:46 PM   [facility] [address]  [place], [place], [address] Phone: [phone]   Fax:  [phone]  Name: Brian Davidson MRN: [id number] Date of Birth: [birth year]

## 2020-01-01 ENCOUNTER — Encounter: Payer: PPO | Admitting: Physical Therapy

## 2020-01-03 ENCOUNTER — Ambulatory Visit: Payer: PPO | Admitting: Physical Therapy

## 2020-01-03 ENCOUNTER — Encounter: Payer: Self-pay | Admitting: Physical Therapy

## 2020-01-03 ENCOUNTER — Other Ambulatory Visit: Payer: Self-pay

## 2020-01-03 DIAGNOSIS — R262 Difficulty in walking, not elsewhere classified: Secondary | ICD-10-CM

## 2020-01-03 DIAGNOSIS — M25561 Pain in right knee: Secondary | ICD-10-CM | POA: Diagnosis not present

## 2020-01-03 DIAGNOSIS — M25661 Stiffness of right knee, not elsewhere classified: Secondary | ICD-10-CM

## 2020-01-03 DIAGNOSIS — M6281 Muscle weakness (generalized): Secondary | ICD-10-CM

## 2020-01-03 NOTE — Therapy (Signed)
[facility] [address]  [place], [place], [address] Phone: [phone]   Fax:  [phone]  Physical Therapy Treatment  Patient Details  Name: Brian Davidson MRN: [id number] Date of Birth: [birth year] Referring Provider (PT): Theresa Duty, [place]   Encounter Date: 01/03/2020  PT End of Session - 01/03/20 [id number]    Visit Number  4    Number of Visits  13    Date for PT Re-Evaluation  01/24/20    Authorization Type  [employer]    PT Start Time  [id number]    PT Stop Time  0936    PT Time Calculation (min)  49 min    Activity Tolerance  Patient tolerated treatment well;Patient limited by pain;Patient limited by fatigue    Behavior During Therapy  [facility] for tasks assessed/performed       Past Medical History:  Diagnosis Date  . Arthritis   . Biliary dyskinesia   . Carotid artery disease ([place]) 07/01/2016  . Dyslipidemia 07/13/2017  . Dysrhythmia    a fib  . GERD (gastroesophageal reflux disease)    occasional  . H/O measles   . History of chicken pox   . History of kidney stones   . Hypertension   . Neuromuscular disorder (HCC)    neuropathy feet   . Persistent atrial fibrillation ([place])   . Pneumonia 1992  . Presence of permanent cardiac pacemaker   . Tachycardia-bradycardia ([place])    a. s/p STJ dual chamber pacemaker  . Thiamine deficiency 01/19/2018  . Valvular heart disease 07/01/2016    Past Surgical History:  Procedure Laterality Date  . CHOLECYSTECTOMY N/A 11/24/2015   Procedure: LAPAROSCOPIC CHOLECYSTECTOMY;  Surgeon: Greer Pickerel, MD;  Location: [facility];  Service: General;  Laterality: N/A;  . CYSTOSCOPY  2016  . EYE SURGERY     bil cataract  . FRACTURE SURGERY  2005   left ankle  . HERNIA REPAIR     left groin, no mesh  . PERMANENT PACEMAKER INSERTION N/A 01/31/2012   SJM Accent SR RF implanted by DR Allred  . TOTAL KNEE ARTHROPLASTY Right 12/23/2019   Procedure: TOTAL KNEE ARTHROPLASTY;  Surgeon:  Gaynelle Arabian, MD;  Location: [facility];  Service: Orthopedics;  Laterality: Right;  53min    There were no vitals filed for this visit.  Subjective Assessment - 01/03/20 0849    Subjective  Feeling better since monday. Has been having some pain in the past couple days in the R lateral lower leg which is worse with heel slides.    Patient is accompained by:  Family member   wife   Pertinent History  pacemaker, tachycardia/bradycardia, a-fib, kidney stone, HTN, GERD, HLD, CAD, hernie repair, L ankle fx surgery    Diagnostic tests  none recent    Patient Stated Goals  work on ROM    Currently in Pain?  Yes    Pain Score  5     Pain Location  Leg    Pain Orientation  Left;Lateral    Pain Descriptors / Indicators  Dull    Pain Type  Acute pain;Surgical pain                       [facility] Adult PT Treatment/Exercise - 01/03/20 0001      Exercises   Exercises  Knee/Hip      Knee/Hip Exercises: Aerobic   Nustep  Lvl 1, 6 min (UE/LEs)  Knee/Hip Exercises: Standing   Heel Raises  Both;1 set;10 reps    Heel Raises Limitations  at counter; decreased ROM    Hip Flexion  AROM;Both;1 set;10 reps    Hip Flexion Limitations  marching at counter top    Functional Squat  1 set;10 reps    Functional Squat Limitations  mini squat at counter   cues to sit back oneto heels     Knee/Hip Exercises: Seated   Long Arc Quad  AROM;Right;1 set;10 reps    Long Arc Quad Limitations  lacking TKE    Hamstring Curl  Strengthening;Right;1 set;10 reps    Hamstring Limitations  red TB    Sit to Sand  1 set;10 reps;with UE support   sitting on 1 airex; intermittent min A; 1 UE use     Vasopneumatic   Number Minutes Vasopneumatic   10 minutes    Vasopnuematic Location   Knee   R   Vasopneumatic Pressure  Low    Vasopneumatic Temperature   coldest temp.               PT Education - 01/03/20 0936    Education Details  update to HEP    Person(s) Educated  Patient    Methods   Explanation;Demonstration;Tactile cues;Verbal cues;Handout    Comprehension  Verbalized understanding;Returned demonstration       PT Short Term Goals - 12/30/19 0939      PT SHORT TERM GOAL #1   Title  Patient to be independent with initial HEP.    Time  2    Period  Weeks    Status  On-going    Target Date  01/10/20        PT Long Term Goals - 12/30/19 0939      PT LONG TERM GOAL #1   Title  Patient to be independent with advanced HEP.    Time  4    Period  Weeks    Status  On-going      PT LONG TERM GOAL #2   Title  Patient to demonstrate R knee AROM/PROM 0-120 degrees.    Time  4    Period  Weeks    Status  On-going      PT LONG TERM GOAL #3   Title  Patient to demonstrate B LE strength >/=4+/5.    Time  4    Period  Weeks    Status  On-going      PT LONG TERM GOAL #4   Title  Patient to report tolerance of walking 1 mile without pain limiting.    Time  4    Period  Weeks    Status  On-going      PT LONG TERM GOAL #5   Title  Patient to demonstrate symmetrical step length, knee flexion, and weight shift with LRAD.    Time  4    Period  Weeks    Status  On-going            Plan - 01/03/20 0954    Clinical Impression Statement  Patient present with wife. Reporting R lateral lower leg pain, worse when performing heel slides. Upon palpation, patient without tenderness or warmth in the R calf, however was TTP over R anterior tib. Worked on quad and HS strengthening with cues for end range ROM. Practiced STS transitions with slight seat elevated and 1 UE use for assistance. Patient with tendency to become stuck in forward-flexed posture, requiring cues to  activate glutes to transition to more upright posture. Lacking TKE with standing exercises, with cueing to contract R quad to avoid knee buckling. Patient required intermittent sitting rest breaks d/t knee pain and fatigue. Ended session with [place] to R knee for pain relief. Patient reported understanding  of HEP update and without complaints at end of session.    Comorbidities  pacemaker, tachycardia/bradycardia, a-fib, kidney stone, HTN, GERD, HLD, CAD, hernie repair, L ankle fx surgery    Rehab Potential  Good    PT Frequency  3x / week    PT Treatment/Interventions  ADLs/Self Care Home Management;Cryotherapy;Moist Heat;Balance training;Therapeutic exercise;Therapeutic activities;Functional mobility training;Stair training;Gait training;Neuromuscular re-education;Patient/family education;Manual techniques;Vasopneumatic Device;Taping;Energy conservation;Dry needling;Passive range of motion;Scar mobilization    PT Next Visit Plan  Progress R knee ROM and LE strengthening    Consulted and Agree with Plan of Care  Patient       Patient will benefit from skilled therapeutic intervention in order to improve the following deficits and impairments:  Decreased endurance, Hypomobility, Increased edema, Decreased scar mobility, Decreased activity tolerance, Decreased strength, Pain, Increased fascial restricitons, Decreased balance, Difficulty walking, Increased muscle spasms, Improper body mechanics, Decreased range of motion, Impaired flexibility, Postural dysfunction  Visit Diagnosis: Acute pain of right knee  Stiffness of right knee, not elsewhere classified  Muscle weakness (generalized)  Difficulty in walking, not elsewhere classified     Problem List Patient Active Problem List   Diagnosis Date Noted  . Primary osteoarthritis of right knee 12/23/2019  . Symptomatic bradycardia 05/31/2019  . Vertigo 07/27/2018  . Multiple closed fractures of ribs of right side 01/21/2018  . Thiamine deficiency 01/19/2018  . Peripheral neuropathy 07/13/2017  . Dyslipidemia 07/13/2017  . Preventative health care 01/11/2017  . Carotid artery disease ([place]) 07/01/2016  . Mitral valve disorder 07/01/2016  . Low back pain 12/31/2015  . Osteopenia 12/31/2015  . Allergic state 12/31/2015  . Skin lesion  12/31/2015  . History of chicken pox   . Calculi, ureter 03/30/2015  . OA (osteoarthritis) of knee 05/10/2014  . Pacemaker-St[employer]Jude 02/01/2012  . Permanent atrial fibrillation ([place])   . Hypertension   . Heart murmur   . Chronic anticoagulation      Janene Harvey, PT, DPT 01/03/20 10:02 AM   [facility] [address]  [place], [place], [address] Phone: [phone]   Fax:  [phone]  Name: HUGHIE MARTINZ MRN: [id number] Date of Birth: 16-Nov-1935

## 2020-01-06 ENCOUNTER — Other Ambulatory Visit: Payer: Self-pay

## 2020-01-06 ENCOUNTER — Ambulatory Visit: Payer: PPO | Attending: Student | Admitting: Physical Therapy

## 2020-01-06 ENCOUNTER — Encounter: Payer: Self-pay | Admitting: Physical Therapy

## 2020-01-06 DIAGNOSIS — M6281 Muscle weakness (generalized): Secondary | ICD-10-CM

## 2020-01-06 DIAGNOSIS — M25661 Stiffness of right knee, not elsewhere classified: Secondary | ICD-10-CM | POA: Diagnosis not present

## 2020-01-06 DIAGNOSIS — M25561 Pain in right knee: Secondary | ICD-10-CM

## 2020-01-06 DIAGNOSIS — R262 Difficulty in walking, not elsewhere classified: Secondary | ICD-10-CM

## 2020-01-06 NOTE — Therapy (Signed)
[facility] [address]  [place], [place], [address] Phone: [phone]   Fax:  [phone]  Physical Therapy Treatment  Patient Details  Name: Brian Davidson MRN: [id number] Date of Birth: Aug 29, 1936 Referring Provider (PT): Theresa Duty, [place]   Encounter Date: 01/06/2020  PT End of Session - 01/06/20 1551    Visit Number  5    Number of Visits  13    Date for PT Re-Evaluation  01/24/20    Authorization Type  [employer]    PT Start Time  1441    PT Stop Time  1541    PT Time Calculation (min)  60 min    Equipment Utilized During Treatment  Gait belt    Activity Tolerance  Patient tolerated treatment well;Patient limited by pain    Behavior During Therapy  [facility] for tasks assessed/performed       Past Medical History:  Diagnosis Date  . Arthritis   . Biliary dyskinesia   . Carotid artery disease ([place]) 07/01/2016  . Dyslipidemia 07/13/2017  . Dysrhythmia    a fib  . GERD (gastroesophageal reflux disease)    occasional  . H/O measles   . History of chicken pox   . History of kidney stones   . Hypertension   . Neuromuscular disorder (HCC)    neuropathy feet   . Persistent atrial fibrillation ([place])   . Pneumonia 1992  . Presence of permanent cardiac pacemaker   . Tachycardia-bradycardia ([place])    a. s/p STJ dual chamber pacemaker  . Thiamine deficiency 01/19/2018  . Valvular heart disease 07/01/2016    Past Surgical History:  Procedure Laterality Date  . CHOLECYSTECTOMY N/A 11/24/2015   Procedure: LAPAROSCOPIC CHOLECYSTECTOMY;  Surgeon: Greer Pickerel, MD;  Location: [facility];  Service: General;  Laterality: N/A;  . CYSTOSCOPY  2016  . EYE SURGERY     bil cataract  . FRACTURE SURGERY  2005   left ankle  . HERNIA REPAIR     left groin, no mesh  . PERMANENT PACEMAKER INSERTION N/A 01/31/2012   SJM Accent SR RF implanted by DR Allred  . TOTAL KNEE ARTHROPLASTY Right 12/23/2019   Procedure: TOTAL KNEE  ARTHROPLASTY;  Surgeon: Gaynelle Arabian, MD;  Location: [facility];  Service: Orthopedics;  Laterality: Right;  69mn    There were no vitals filed for this visit.  Subjective Assessment - 01/06/20 1440    Subjective  Having some increased pain over the lateral side of the knee, running down the lateral calf. HEP is going well with exception of sititng knee flexion stretch. Reports 75-80% improvement.    Patient is accompained by:  Family member   wife   Pertinent History  pacemaker, tachycardia/bradycardia, a-fib, kidney stone, HTN, GERD, HLD, CAD, hernie repair, L ankle fx surgery    Diagnostic tests  none recent    Patient Stated Goals  work on ROM    Currently in Pain?  Yes    Pain Score  8     Pain Location  Knee    Pain Orientation  Right;Lateral    Pain Descriptors / Indicators  Sharp    Pain Type  Acute pain;Surgical pain         [facility] PT Assessment - 01/06/20 0001      AROM   Right Knee Extension  8    Right Knee Flexion  85      PROM   Right Knee Extension  5  Right Knee Flexion  95      Strength   Right Hip Flexion  4+/5    Right Hip ABduction  4/5    Right Hip ADduction  4+/5    Left Hip Flexion  4+/5    Left Hip ABduction  4+/5    Left Hip ADduction  4+/5    Right Knee Flexion  4-/5   pain   Right Knee Extension  5/5    Left Knee Flexion  4+/5    Left Knee Extension  5/5    Right Ankle Dorsiflexion  4+/5    Right Ankle Plantar Flexion  4/5    Left Ankle Dorsiflexion  4+/5    Left Ankle Plantar Flexion  4+/5                   [facility] Adult PT Treatment/Exercise - 01/06/20 0001      Ambulation/Gait   Ambulation Distance (Feet)  180 Feet    Assistive device  Rolling walker;Straight cane    Gait Pattern  Step-through pattern;Decreased hip/knee flexion - right;Decreased stance time - left;Decreased step length - right;Decreased weight shift to right;Trunk flexed    Ambulation Surface  Level;Indoor    Gait velocity  decreased    Gait Comments   cues for proper SPC sequencing and increasing step length on L LE; good stability with SPC but lacking TKE on R LE      Knee/Hip Exercises: Stretches   Passive Hamstring Stretch  Right;2 reps;30 seconds    Passive Hamstring Stretch Limitations  supine with strap     ITB Stretch  Right;2 reps;30 seconds    ITB Stretch Limitations  supine with strap    Gastroc Stretch  Right;1 rep;30 seconds    Gastroc Stretch Limitations  at counter    Other Knee/Hip Stretches  modified R hip ER stretch with folded pillow under knee 30"      Knee/Hip Exercises: Aerobic   Nustep  Lvl 3, 6 min (UE/LEs)      Knee/Hip Exercises: Supine   Heel Slides  Right;AAROM;10 reps    Heel Slides Limitations  with strap and orange pball      Manual Therapy   Manual Therapy  Myofascial release;Passive ROM;Soft tissue mobilization    Manual therapy comments  sidelying    Soft tissue mobilization  STM to R lateral and medial proximal gastroc, peroneals, R distal medial and lateral HS- multiple tender trigger points throughout- particularly tight and painful over distal ITB, TTP over lateral HS and gastroc    Myofascial Release  manual TPR to R proximal lateral gastroc and distal lateral HS- report of replication of pain down lateral lower leg    Passive ROM  R ober's stretch 2x30" to tolerance             PT Education - 01/06/20 1550    Education Details  update to HEP; discussion on objective progress and remaining imapairments    Person(s) Educated  Patient    Methods  Explanation;Demonstration;Tactile cues;Verbal cues;Handout    Comprehension  Verbalized understanding;Returned demonstration       PT Short Term Goals - 01/06/20 1445      PT SHORT TERM GOAL #1   Title  Patient to be independent with initial HEP.    Time  2    Period  Weeks    Status  Achieved    Target Date  01/10/20        PT Long Term Goals -  01/06/20 1447      PT LONG TERM GOAL #1   Title  Patient to be independent with  advanced HEP.    Time  4    Period  Weeks    Status  Partially Met   met for current     PT LONG TERM GOAL #2   Title  Patient to demonstrate R knee AROM/PROM 0-120 degrees.    Time  4    Period  Weeks    Status  On-going   AROM 8-85 degrees, PROM 5-95 degrees     PT LONG TERM GOAL #3   Title  Patient to demonstrate B LE strength >/=4+/5.    Time  4    Period  Weeks    Status  Partially Met   R hip abduction and knee flexion most limiting     PT LONG TERM GOAL #4   Title  Patient to report tolerance of walking 1 mile without pain limiting.    Time  4    Period  Weeks    Status  On-going   able to ambulate 61mn 4-5x/day with walker     PT LONG TERM GOAL #5   Title  Patient to demonstrate symmetrical step length, knee flexion, and weight shift with LRAD.    Time  4    Period  Weeks    Status  Partially Met   lacking R TKE and demonstrating decreased step length on L LE           Plan - 01/06/20 1551    Clinical Impression Statement  Patient remarking on increased R lateral knee pain radiating down the lateral lower leg today. Overall, reporting 75-80% improvement in R knee since surgery. Demonstrating good improvement in LE strength, with R hip abduction and knee flexion most limiting today. R knee AROM now measuring 8-85 degrees, PROM 5-95 degrees. Upon palpation at beginning of session, patient with tender trigger points over R lateral gastroc and peroneals as well as tenderness over R lateral joint line. Addressed pain with STM and TPR, with many evident trigger points over R lateral and medial proximal gastroc, peroneals, R distal medial and lateral HS and tenderness over the distal ITB. Worked on LE stretching to these muscle groups with better tolerance with self-stretching rather than PROM. Patient was able to participate in gait training with rolling walker and SPC with good stability and cues for proper SPC sequencing and increasing step length on L LE. Advised  patient to practice using SPC inside d/t good stability and cane sequencing today. Updated HEP with exercises that were well-tolerated today. Patient reported understanding and without complaints at end of session. Patient progressing well towards goals despite recent increase in muscular pain.    Comorbidities  pacemaker, tachycardia/bradycardia, a-fib, kidney stone, HTN, GERD, HLD, CAD, hernie repair, L ankle fx surgery    Rehab Potential  Good    PT Frequency  3x / week    PT Treatment/Interventions  ADLs/Self Care Home Management;Cryotherapy;Moist Heat;Balance training;Therapeutic exercise;Therapeutic activities;Functional mobility training;Stair training;Gait training;Neuromuscular re-education;Patient/family education;Manual techniques;Vasopneumatic Device;Taping;Energy conservation;Dry needling;Passive range of motion;Scar mobilization    PT Next Visit Plan  Progress R knee ROM and LE strengthening    Consulted and Agree with Plan of Care  Patient       Patient will benefit from skilled therapeutic intervention in order to improve the following deficits and impairments:  Decreased endurance, Hypomobility, Increased edema, Decreased scar mobility, Decreased activity tolerance, Decreased strength, Pain, Increased fascial  restricitons, Decreased balance, Difficulty walking, Increased muscle spasms, Improper body mechanics, Decreased range of motion, Impaired flexibility, Postural dysfunction  Visit Diagnosis: Acute pain of right knee  Stiffness of right knee, not elsewhere classified  Muscle weakness (generalized)  Difficulty in walking, not elsewhere classified     Problem List Patient Active Problem List   Diagnosis Date Noted  . Primary osteoarthritis of right knee 12/23/2019  . Symptomatic bradycardia 05/31/2019  . Vertigo 07/27/2018  . Multiple closed fractures of ribs of right side 01/21/2018  . Thiamine deficiency 01/19/2018  . Peripheral neuropathy 07/13/2017  .  Dyslipidemia 07/13/2017  . Preventative health care 01/11/2017  . Carotid artery disease ([place]) 07/01/2016  . Mitral valve disorder 07/01/2016  . Low back pain 12/31/2015  . Osteopenia 12/31/2015  . Allergic state 12/31/2015  . Skin lesion 12/31/2015  . History of chicken pox   . Calculi, ureter 03/30/2015  . OA (osteoarthritis) of knee 05/10/2014  . Pacemaker-St.Jude 02/01/2012  . Permanent atrial fibrillation ([place])   . Hypertension   . Heart murmur   . Chronic anticoagulation      Janene Harvey, PT, DPT 01/06/20 3:54 PM   [facility] [address]  [place], [place], [address] Phone: [phone]   Fax:  [phone]  Name: Brian Davidson MRN: [id number] Date of Birth: 06/20/1936

## 2020-01-08 ENCOUNTER — Encounter: Payer: Self-pay | Admitting: Physical Therapy

## 2020-01-08 ENCOUNTER — Ambulatory Visit: Payer: PPO | Admitting: Physical Therapy

## 2020-01-08 ENCOUNTER — Other Ambulatory Visit: Payer: Self-pay

## 2020-01-08 DIAGNOSIS — R262 Difficulty in walking, not elsewhere classified: Secondary | ICD-10-CM

## 2020-01-08 DIAGNOSIS — M25561 Pain in right knee: Secondary | ICD-10-CM | POA: Diagnosis not present

## 2020-01-08 DIAGNOSIS — M25661 Stiffness of right knee, not elsewhere classified: Secondary | ICD-10-CM

## 2020-01-08 DIAGNOSIS — M6281 Muscle weakness (generalized): Secondary | ICD-10-CM

## 2020-01-08 NOTE — Therapy (Signed)
[facility] [address]  [place], [place], [address] Phone: [phone]   Fax:  [phone]  Physical Therapy Treatment  Patient Details  Name: Brian Davidson MRN: [id number] Date of Birth: 07/30/1936 Referring Provider (PT): Theresa Duty, [place]   Encounter Date: 01/08/2020  PT End of Session - 01/08/20 1446    Visit Number  6    Number of Visits  13    Date for PT Re-Evaluation  01/24/20    Authorization Type  [employer]    PT Start Time  1404    PT Stop Time  1452    PT Time Calculation (min)  48 min    Equipment Utilized During Treatment  Gait belt    Activity Tolerance  Patient tolerated treatment well;Patient limited by pain    Behavior During Therapy  [facility] for tasks assessed/performed       Past Medical History:  Diagnosis Date  . Arthritis   . Biliary dyskinesia   . Carotid artery disease ([place]) 07/01/2016  . Dyslipidemia 07/13/2017  . Dysrhythmia    a fib  . GERD (gastroesophageal reflux disease)    occasional  . H/O measles   . History of chicken pox   . History of kidney stones   . Hypertension   . Neuromuscular disorder (HCC)    neuropathy feet   . Persistent atrial fibrillation ([place])   . Pneumonia 1992  . Presence of permanent cardiac pacemaker   . Tachycardia-bradycardia ([place])    a. s/p STJ dual chamber pacemaker  . Thiamine deficiency 01/19/2018  . Valvular heart disease 07/01/2016    Past Surgical History:  Procedure Laterality Date  . CHOLECYSTECTOMY N/A 11/24/2015   Procedure: LAPAROSCOPIC CHOLECYSTECTOMY;  Surgeon: Greer Pickerel, MD;  Location: [facility];  Service: General;  Laterality: N/A;  . CYSTOSCOPY  2016  . EYE SURGERY     bil cataract  . FRACTURE SURGERY  2005   left ankle  . HERNIA REPAIR     left groin, no mesh  . PERMANENT PACEMAKER INSERTION N/A 01/31/2012   SJM Accent SR RF implanted by DR Allred  . TOTAL KNEE ARTHROPLASTY Right 12/23/2019   Procedure: TOTAL KNEE  ARTHROPLASTY;  Surgeon: Gaynelle Arabian, MD;  Location: [facility];  Service: Orthopedics;  Laterality: Right;  33mn    There were no vitals filed for this visit.  Subjective Assessment - 01/08/20 1405    Subjective  Reports that he got his bandage taken off at last appointment. Lateral knee pain is not as bad as before. Still having pain with mod thomas stretch.    Pertinent History  pacemaker, tachycardia/bradycardia, a-fib, kidney stone, HTN, GERD, HLD, CAD, hernie repair, L ankle fx surgery    Diagnostic tests  none recent    Patient Stated Goals  work on ROM    Currently in Pain?  Yes    Pain Score  4     Pain Location  Knee    Pain Orientation  Right;Lateral    Pain Descriptors / Indicators  Sharp    Pain Type  Acute pain;Surgical pain                       [facility] Adult PT Treatment/Exercise - 01/08/20 0001      Knee/Hip Exercises: Stretches   Passive Hamstring Stretch  Right;30 seconds;1 rep    Passive Hamstring Stretch Limitations  supine with strap     QSports administrator  Right;2 reps;30 seconds    Quad Stretch Limitations  prone with strap and pillow under knee    Gastroc Stretch  Right;30 seconds;2 reps    Gastroc Stretch Limitations  at counter      Knee/Hip Exercises: Aerobic   Nustep  Lvl 4, 6 min (UE/LEs)      Knee/Hip Exercises: Standing   Forward Step Up  Right;1 set;10 reps;Hand Hold: 1;Step Height: 4";Step Height: 6";2 sets    Forward Step Up Limitations  10x 4". 5x 6"   cues for TKE on R LE   Functional Squat  2 sets;5 reps    Functional Squat Limitations  mini squat at TM rail   c/o popping in L knee     Knee/Hip Exercises: Supine   Quad Sets  Right;Strengthening;5 sets    Quad Sets Limitations  5x10" with towel roll under ankle    Straight Leg Raises  Strengthening;Right;1 set;5 sets    Straight Leg Raises Limitations  lacking TKE    Straight Leg Raise with External Rotation  Strengthening;Right;1 set;5 reps    Straight Leg Raise with External  Rotation Limitations  lacking TKE      Vasopneumatic   Number Minutes Vasopneumatic   10 minutes    Vasopnuematic Location   Knee   R   Vasopneumatic Pressure  Low    Vasopneumatic Temperature   coldest temp.               PT Education - 01/08/20 1445    Education Details  update to HEP    Person(s) Educated  Patient    Methods  Explanation;Demonstration;Tactile cues;Verbal cues;Handout    Comprehension  Verbalized understanding;Returned demonstration       PT Short Term Goals - 01/06/20 1445      PT SHORT TERM GOAL #1   Title  Patient to be independent with initial HEP.    Time  2    Period  Weeks    Status  Achieved    Target Date  01/10/20        PT Long Term Goals - 01/06/20 1447      PT LONG TERM GOAL #1   Title  Patient to be independent with advanced HEP.    Time  4    Period  Weeks    Status  Partially Met   met for current     PT LONG TERM GOAL #2   Title  Patient to demonstrate R knee AROM/PROM 0-120 degrees.    Time  4    Period  Weeks    Status  On-going   AROM 8-85 degrees, PROM 5-95 degrees     PT LONG TERM GOAL #3   Title  Patient to demonstrate B LE strength >/=4+/5.    Time  4    Period  Weeks    Status  Partially Met   R hip abduction and knee flexion most limiting     PT LONG TERM GOAL #4   Title  Patient to report tolerance of walking 1 mile without pain limiting.    Time  4    Period  Weeks    Status  On-going   able to ambulate 50mn 4-5x/day with walker     PT LONG TERM GOAL #5   Title  Patient to demonstrate symmetrical step length, knee flexion, and weight shift with LRAD.    Time  4    Period  Weeks    Status  Partially Met  lacking R TKE and demonstrating decreased step length on L LE           Plan - 01/08/20 1446    Clinical Impression Statement  Patient arriving to session today ambulating with SPC. Also with sightly improved R knee TKE at heel strike with ambulation. Worked on progressing active R knee  extension with ther-ex. D/t patient reporting lateral knee pain with modified Thomas stretch, initiated prone quad stretching which was better tolerated. Superior aspect of R knee incision appeared slightly bloody mid-session, thus advised patient follow proper incision care. No signs of infection apparent today. Worked on mini squats and anterior step ups with good form. Patient reporting L knee "popping" with squatting, but able to continue after sitting rest break. Updated HEP with exercises that were well-tolerated today- patient and wife both reported understanding. Ended session with [place] to R knee for post-exercise soreness. Patient without complaints at end of session.    Comorbidities  pacemaker, tachycardia/bradycardia, a-fib, kidney stone, HTN, GERD, HLD, CAD, hernie repair, L ankle fx surgery    Rehab Potential  Good    PT Frequency  3x / week    PT Treatment/Interventions  ADLs/Self Care Home Management;Cryotherapy;Moist Heat;Balance training;Therapeutic exercise;Therapeutic activities;Functional mobility training;Stair training;Gait training;Neuromuscular re-education;Patient/family education;Manual techniques;Vasopneumatic Device;Taping;Energy conservation;Dry needling;Passive range of motion;Scar mobilization    PT Next Visit Plan  Progress R knee ROM and LE strengthening    Consulted and Agree with Plan of Care  Patient       Patient will benefit from skilled therapeutic intervention in order to improve the following deficits and impairments:  Decreased endurance, Hypomobility, Increased edema, Decreased scar mobility, Decreased activity tolerance, Decreased strength, Pain, Increased fascial restricitons, Decreased balance, Difficulty walking, Increased muscle spasms, Improper body mechanics, Decreased range of motion, Impaired flexibility, Postural dysfunction  Visit Diagnosis: Acute pain of right knee  Stiffness of right knee, not elsewhere classified  Muscle weakness  (generalized)  Difficulty in walking, not elsewhere classified     Problem List Patient Active Problem List   Diagnosis Date Noted  . Primary osteoarthritis of right knee 12/23/2019  . Symptomatic bradycardia 05/31/2019  . Vertigo 07/27/2018  . Multiple closed fractures of ribs of right side 01/21/2018  . Thiamine deficiency 01/19/2018  . Peripheral neuropathy 07/13/2017  . Dyslipidemia 07/13/2017  . Preventative health care 01/11/2017  . Carotid artery disease ([place]) 07/01/2016  . Mitral valve disorder 07/01/2016  . Low back pain 12/31/2015  . Osteopenia 12/31/2015  . Allergic state 12/31/2015  . Skin lesion 12/31/2015  . History of chicken pox   . Calculi, ureter 03/30/2015  . OA (osteoarthritis) of knee 05/10/2014  . Pacemaker-St.Jude 02/01/2012  . Permanent atrial fibrillation ([place])   . Hypertension   . Heart murmur   . Chronic anticoagulation      Janene Harvey, PT, DPT 01/08/20 2:52 PM   [facility] [address]  [place], [place], [address] Phone: [phone]   Fax:  [phone]  Name: SHAREEF EDDINGER MRN: [id number] Date of Birth: Jul 14, 1936

## 2020-01-10 ENCOUNTER — Other Ambulatory Visit: Payer: Self-pay

## 2020-01-10 ENCOUNTER — Ambulatory Visit: Payer: PPO

## 2020-01-10 DIAGNOSIS — R262 Difficulty in walking, not elsewhere classified: Secondary | ICD-10-CM

## 2020-01-10 DIAGNOSIS — M25561 Pain in right knee: Secondary | ICD-10-CM | POA: Diagnosis not present

## 2020-01-10 DIAGNOSIS — M6281 Muscle weakness (generalized): Secondary | ICD-10-CM

## 2020-01-10 DIAGNOSIS — M25661 Stiffness of right knee, not elsewhere classified: Secondary | ICD-10-CM

## 2020-01-10 NOTE — Therapy (Signed)
[facility] [address]  [place], [place], [address] Phone: [phone]   Fax:  [phone]  Physical Therapy Treatment  Patient Details  Name: Brian Davidson MRN: [id number] Date of Birth: [birth year] Referring Provider (PT): Theresa Duty, [place]   Encounter Date: 01/10/2020  PT End of Session - 01/10/20 0942    Visit Number  7    Number of Visits  13    Date for PT Re-Evaluation  01/24/20    Authorization Type  [employer]    PT Start Time  0936    PT Stop Time  1030    PT Time Calculation (min)  54 min    Equipment Utilized During Treatment  Gait belt    Activity Tolerance  Patient tolerated treatment well;Patient limited by pain    Behavior During Therapy  [facility] for tasks assessed/performed       Past Medical History:  Diagnosis Date  . Arthritis   . Biliary dyskinesia   . Carotid artery disease ([place]) 07/01/2016  . Dyslipidemia 07/13/2017  . Dysrhythmia    a fib  . GERD (gastroesophageal reflux disease)    occasional  . H/O measles   . History of chicken pox   . History of kidney stones   . Hypertension   . Neuromuscular disorder (HCC)    neuropathy feet   . Persistent atrial fibrillation ([place])   . Pneumonia 1992  . Presence of permanent cardiac pacemaker   . Tachycardia-bradycardia ([place])    a. s/p STJ dual chamber pacemaker  . Thiamine deficiency 01/19/2018  . Valvular heart disease 07/01/2016    Past Surgical History:  Procedure Laterality Date  . CHOLECYSTECTOMY N/A 11/24/2015   Procedure: LAPAROSCOPIC CHOLECYSTECTOMY;  Surgeon: Greer Pickerel, MD;  Location: [facility];  Service: General;  Laterality: N/A;  . CYSTOSCOPY  2016  . EYE SURGERY     bil cataract  . FRACTURE SURGERY  2005   left ankle  . HERNIA REPAIR     left groin, no mesh  . PERMANENT PACEMAKER INSERTION N/A 01/31/2012   SJM Accent SR RF implanted by DR Allred  . TOTAL KNEE ARTHROPLASTY Right 12/23/2019   Procedure: TOTAL KNEE  ARTHROPLASTY;  Surgeon: Gaynelle Arabian, MD;  Location: [facility];  Service: Orthopedics;  Laterality: Right;  31mn    There were no vitals filed for this visit.  Subjective Assessment - 01/10/20 0939    Subjective  Pt. noting some numbness on outer half of knee.    Pertinent History  pacemaker, tachycardia/bradycardia, a-fib, kidney stone, HTN, GERD, HLD, CAD, hernie repair, L ankle fx surgery    Diagnostic tests  none recent    Patient Stated Goals  work on ROM    Currently in Pain?  Yes    Pain Score  5     Pain Location  Knee    Pain Orientation  Right;Lateral    Pain Descriptors / Indicators  Dull    Pain Type  Acute pain;Surgical pain    Multiple Pain Sites  No                       [facility] Adult PT Treatment/Exercise - 01/10/20 0001      Knee/Hip Exercises: Stretches   Passive Hamstring Stretch  Right;30 seconds;1 rep    Passive Hamstring Stretch Limitations  Manual with therapist     ITB Stretch  Right;1 rep;30 seconds    ITB Stretch Limitations  supine with strap    Piriformis Stretch  Right;1 rep;30 seconds    Piriformis Stretch Limitations  manual with therapist    Other Knee/Hip Stretches  manual R glute/piriformis stretch 2 x 30 sec       Knee/Hip Exercises: Aerobic   Nustep  --   no warmup due to machine availability      Knee/Hip Exercises: Standing   Heel Raises  Both;15 reps    Heel Raises Limitations  counter     Functional Squat  10 reps;3 seconds   heavy cueing to posterior hip shift    Functional Squat Limitations  "mini" at counter   initial c/o L knee pain - vc for reduced depth hip posterior     Knee/Hip Exercises: Seated   Long Arc Quad  AROM;Right;1 set;15 reps;Strengthening    Long Arc Quad Limitations  lacking TKE      Knee/Hip Exercises: Supine   Heel Slides  Right;AAROM;10 reps    Heel Slides Limitations  with strap and and peanut p-ball       Vasopneumatic   Number Minutes Vasopneumatic   15 minutes    Vasopnuematic  Location   Knee   R   Vasopneumatic Pressure  Low    Vasopneumatic Temperature   coldest temp.        Manual Therapy   Manual Therapy  Joint mobilization;Passive ROM    Manual therapy comments  supine     Joint Mobilization  R patellar mobs all directions - min-mod restricted     Passive ROM  Manual R LE stretching (see flowsheet               PT Short Term Goals - 01/06/20 1445      PT SHORT TERM GOAL #1   Title  Patient to be independent with initial HEP.    Time  2    Period  Weeks    Status  Achieved    Target Date  01/10/20        PT Long Term Goals - 01/06/20 1447      PT LONG TERM GOAL #1   Title  Patient to be independent with advanced HEP.    Time  4    Period  Weeks    Status  Partially Met   met for current     PT LONG TERM GOAL #2   Title  Patient to demonstrate R knee AROM/PROM 0-120 degrees.    Time  4    Period  Weeks    Status  On-going   AROM 8-85 degrees, PROM 5-95 degrees     PT LONG TERM GOAL #3   Title  Patient to demonstrate B LE strength >/=4+/5.    Time  4    Period  Weeks    Status  Partially Met   R hip abduction and knee flexion most limiting     PT LONG TERM GOAL #4   Title  Patient to report tolerance of walking 1 mile without pain limiting.    Time  4    Period  Weeks    Status  On-going   able to ambulate 74mn 4-5x/day with walker     PT LONG TERM GOAL #5   Title  Patient to demonstrate symmetrical step length, knee flexion, and weight shift with LRAD.    Time  4    Period  Weeks    Status  Partially Met   lacking R TKE and demonstrating decreased  step length on L LE           Plan - 01/10/20 0944    Clinical Impression Statement  Brian Davidson reporting some L knee pain with counter squat.  Review of this activity revealing pt. lacking adequate posterior hip position (knees over toes) - Improved tolerance after correction.  Session focused on R LE manual stretching with therapist for improved ITB/patellar mobility  and LE strengthening to tolerance.  Pt. still demonstrating significant R LE swelling likely contributing to ongoing moderate reported pain levels at R knee.  R knee ROM not formally measured today however lacking ~ 10-95 dg and will benefit from further skilled MT/therex for improved functional ROM and gait mechanics for improved mobility.  Superior incision showing some scabbing today which pt. reporting he had small amount of bleeding with prone quad stretch last session which was noted by supervising PT.  Ended session with ice/compression to address post-exercise swelling and pain.    Comorbidities  pacemaker, tachycardia/bradycardia, a-fib, kidney stone, HTN, GERD, HLD, CAD, hernie repair, L ankle fx surgery    Rehab Potential  Good    PT Treatment/Interventions  ADLs/Self Care Home Management;Cryotherapy;Moist Heat;Balance training;Therapeutic exercise;Therapeutic activities;Functional mobility training;Stair training;Gait training;Neuromuscular re-education;Patient/family education;Manual techniques;Vasopneumatic Device;Taping;Energy conservation;Dry needling;Passive range of motion;Scar mobilization    PT Next Visit Plan  Progress R knee ROM and LE strengthening    Consulted and Agree with Plan of Care  Patient       Patient will benefit from skilled therapeutic intervention in order to improve the following deficits and impairments:  Decreased endurance, Hypomobility, Increased edema, Decreased scar mobility, Decreased activity tolerance, Decreased strength, Pain, Increased fascial restricitons, Decreased balance, Difficulty walking, Increased muscle spasms, Improper body mechanics, Decreased range of motion, Impaired flexibility, Postural dysfunction  Visit Diagnosis: Acute pain of right knee  Stiffness of right knee, not elsewhere classified  Muscle weakness (generalized)  Difficulty in walking, not elsewhere classified     Problem List Patient Active Problem List   Diagnosis  Date Noted  . Primary osteoarthritis of right knee 12/23/2019  . Symptomatic bradycardia 05/31/2019  . Vertigo 07/27/2018  . Multiple closed fractures of ribs of right side 01/21/2018  . Thiamine deficiency 01/19/2018  . Peripheral neuropathy 07/13/2017  . Dyslipidemia 07/13/2017  . Preventative health care 01/11/2017  . Carotid artery disease ([place]) 07/01/2016  . Mitral valve disorder 07/01/2016  . Low back pain 12/31/2015  . Osteopenia 12/31/2015  . Allergic state 12/31/2015  . Skin lesion 12/31/2015  . History of chicken pox   . Calculi, ureter 03/30/2015  . OA (osteoarthritis) of knee 05/10/2014  . Pacemaker-St.Jude 02/01/2012  . Permanent atrial fibrillation ([place])   . Hypertension   . Heart murmur   . Chronic anticoagulation     Bess Harvest, PTA 01/10/20 12:48 PM   [facility] [address]  [place], [place], [address] Phone: [phone]   Fax:  [phone]  Name: Brian Davidson MRN: [id number] Date of Birth: June 25, 1936

## 2020-01-12 ENCOUNTER — Other Ambulatory Visit: Payer: Self-pay | Admitting: Family Medicine

## 2020-01-13 ENCOUNTER — Ambulatory Visit: Payer: PPO | Admitting: Physical Therapy

## 2020-01-13 ENCOUNTER — Other Ambulatory Visit: Payer: Self-pay

## 2020-01-13 ENCOUNTER — Encounter: Payer: Self-pay | Admitting: Physical Therapy

## 2020-01-13 ENCOUNTER — Other Ambulatory Visit: Payer: Self-pay | Admitting: *Deleted

## 2020-01-13 DIAGNOSIS — M25561 Pain in right knee: Secondary | ICD-10-CM

## 2020-01-13 DIAGNOSIS — R262 Difficulty in walking, not elsewhere classified: Secondary | ICD-10-CM

## 2020-01-13 DIAGNOSIS — M6281 Muscle weakness (generalized): Secondary | ICD-10-CM

## 2020-01-13 DIAGNOSIS — M25661 Stiffness of right knee, not elsewhere classified: Secondary | ICD-10-CM

## 2020-01-13 NOTE — Therapy (Signed)
[facility] [address]  [place], [place], [address] Phone: [phone]   Fax:  [phone]  Physical Therapy Treatment  Patient Details  Name: Brian Davidson MRN: [id number] Date of Birth: 1936/05/19 Referring Provider (PT): Theresa Duty, [place]   Encounter Date: 01/13/2020  PT End of Session - 01/13/20 1613    Visit Number  8    Number of Visits  13    Date for PT Re-Evaluation  01/24/20    Authorization Type  [employer]    PT Start Time  1316    PT Stop Time  1358    PT Time Calculation (min)  42 min    Equipment Utilized During Treatment  --    Activity Tolerance  Patient tolerated treatment well;Patient limited by pain    Behavior During Therapy  [facility] for tasks assessed/performed       Past Medical History:  Diagnosis Date  . Arthritis   . Biliary dyskinesia   . Carotid artery disease ([place]) 07/01/2016  . Dyslipidemia 07/13/2017  . Dysrhythmia    a fib  . GERD (gastroesophageal reflux disease)    occasional  . H/O measles   . History of chicken pox   . History of kidney stones   . Hypertension   . Neuromuscular disorder (HCC)    neuropathy feet   . Persistent atrial fibrillation ([place])   . Pneumonia 1992  . Presence of permanent cardiac pacemaker   . Tachycardia-bradycardia ([place])    a. s/p STJ dual chamber pacemaker  . Thiamine deficiency 01/19/2018  . Valvular heart disease 07/01/2016    Past Surgical History:  Procedure Laterality Date  . CHOLECYSTECTOMY N/A 11/24/2015   Procedure: LAPAROSCOPIC CHOLECYSTECTOMY;  Surgeon: Greer Pickerel, MD;  Location: [facility];  Service: General;  Laterality: N/A;  . CYSTOSCOPY  2016  . EYE SURGERY     bil cataract  . FRACTURE SURGERY  2005   left ankle  . HERNIA REPAIR     left groin, no mesh  . PERMANENT PACEMAKER INSERTION N/A 01/31/2012   SJM Accent SR RF implanted by DR Allred  . TOTAL KNEE ARTHROPLASTY Right 12/23/2019   Procedure: TOTAL KNEE  ARTHROPLASTY;  Surgeon: Gaynelle Arabian, MD;  Location: [facility];  Service: Orthopedics;  Laterality: Right;  25mn    There were no vitals filed for this visit.  Subjective Assessment - 01/13/20 1318    Subjective  Still having some pain over the lateral R knee.    Patient is accompained by:  Family member   wife   Pertinent History  pacemaker, tachycardia/bradycardia, a-fib, kidney stone, HTN, GERD, HLD, CAD, hernie repair, L ankle fx surgery    Diagnostic tests  none recent    Patient Stated Goals  work on ROM    Currently in Pain?  Yes    Pain Score  7     Pain Location  Knee    Pain Orientation  Right;Lateral    Pain Descriptors / Indicators  Dull    Pain Type  Acute pain;Surgical pain                       [facility] Adult PT Treatment/Exercise - 01/13/20 0001      Knee/Hip Exercises: Stretches   Other Knee/Hip Stretches  R modified hip ER stretch 30", R fig 4 stretch 30"      Knee/Hip Exercises: Aerobic   Stationary Bike  L1 x 668m (  full revolutions)      Knee/Hip Exercises: Standing   Terminal Knee Extension  Strengthening;Right;1 set;10 reps;Theraband    Theraband Level (Terminal Knee Extension)  Level 4 (Blue)    Terminal Knee Extension Limitations  10x3" holding onto back of chair   lacking TKE   Hip Abduction  Stengthening;Right;Left;1 set;10 reps;Knee straight    Abduction Limitations  at counter top    Hip Extension  Stengthening;Right;Left;1 set;10 reps;Knee straight    Extension Limitations  at counter top    Forward Step Up  Right;2 sets;5 reps;Step Height: 6";Hand Hold: 1    Forward Step Up Limitations  5x with SPC, 5x without   cues for SPC squencing and slow controlled descent   Functional Squat  1 set;5 reps    Functional Squat Limitations  at counter; superior knee pain- patient refusing to continue      Manual Therapy   Manual Therapy  Myofascial release;Soft tissue mobilization;Passive ROM    Manual therapy comments  L slidelying    Soft  tissue mobilization  STM to R lateral HS, gastroc, and quads- multiple trigger points evident and patient TTP throughout    Myofascial Release  manual TPR to R lateral HS, gastroc, quad    Passive ROM  R ober's stretch 2x30" to tolerance             PT Education - 01/13/20 1613    Education Details  update to HEP    Person(s) Educated  Patient    Methods  Explanation;Demonstration;Tactile cues;Verbal cues;Handout    Comprehension  Verbalized understanding;Returned demonstration       PT Short Term Goals - 01/06/20 1445      PT SHORT TERM GOAL #1   Title  Patient to be independent with initial HEP.    Time  2    Period  Weeks    Status  Achieved    Target Date  01/10/20        PT Long Term Goals - 01/06/20 1447      PT LONG TERM GOAL #1   Title  Patient to be independent with advanced HEP.    Time  4    Period  Weeks    Status  Partially Met   met for current     PT LONG TERM GOAL #2   Title  Patient to demonstrate R knee AROM/PROM 0-120 degrees.    Time  4    Period  Weeks    Status  On-going   AROM 8-85 degrees, PROM 5-95 degrees     PT LONG TERM GOAL #3   Title  Patient to demonstrate B LE strength >/=4+/5.    Time  4    Period  Weeks    Status  Partially Met   R hip abduction and knee flexion most limiting     PT LONG TERM GOAL #4   Title  Patient to report tolerance of walking 1 mile without pain limiting.    Time  4    Period  Weeks    Status  On-going   able to ambulate 76mn 4-5x/day with walker     PT LONG TERM GOAL #5   Title  Patient to demonstrate symmetrical step length, knee flexion, and weight shift with LRAD.    Time  4    Period  Weeks    Status  Partially Met   lacking R TKE and demonstrating decreased step length on L LE  Plan - 01/13/20 1717    Clinical Impression Statement  Patient reporting continued R lateral knee pain but still improved since surgery. Noting most pain occurs with mini squat. Reassessed this  exercise with patient again noting R superomedial knee pain with shallow mini squats. Patient refusing to continue with this exercise d/t pain, thus proceeded with other ther-ex. Able to perform step up/downs with and without SPC without significant knee pain and cues to increase eccentric control with step down. Patient tolerated STM and TPR to R lateral, HS, quad, and gastroc with multiple trigger points evident throughout and patient reporting TTP over ITB. Tolerated passive TFL stretching followed by active stretching to assess for tolerance. Patient reporting reproduction of his lateral knee pain with Ober's stretch today, indicating continued need for TFL stretching. Patient reported understanding of figure 4 stretch which was added into HEP. No complaints at end of session. Patient's muscular pain currently limiting tolerance for progressing quad strengthening ther-ex.    Comorbidities  pacemaker, tachycardia/bradycardia, a-fib, kidney stone, HTN, GERD, HLD, CAD, hernie repair, L ankle fx surgery    Rehab Potential  Good    PT Treatment/Interventions  ADLs/Self Care Home Management;Cryotherapy;Moist Heat;Balance training;Therapeutic exercise;Therapeutic activities;Functional mobility training;Stair training;Gait training;Neuromuscular re-education;Patient/family education;Manual techniques;Vasopneumatic Device;Taping;Energy conservation;Dry needling;Passive range of motion;Scar mobilization    PT Next Visit Plan  Progress R knee ROM and LE strengthening    Consulted and Agree with Plan of Care  Patient       Patient will benefit from skilled therapeutic intervention in order to improve the following deficits and impairments:  Decreased endurance, Hypomobility, Increased edema, Decreased scar mobility, Decreased activity tolerance, Decreased strength, Pain, Increased fascial restricitons, Decreased balance, Difficulty walking, Increased muscle spasms, Improper body mechanics, Decreased range of  motion, Impaired flexibility, Postural dysfunction  Visit Diagnosis: Acute pain of right knee  Stiffness of right knee, not elsewhere classified  Muscle weakness (generalized)  Difficulty in walking, not elsewhere classified     Problem List Patient Active Problem List   Diagnosis Date Noted  . Primary osteoarthritis of right knee 12/23/2019  . Symptomatic bradycardia 05/31/2019  . Vertigo 07/27/2018  . Multiple closed fractures of ribs of right side 01/21/2018  . Thiamine deficiency 01/19/2018  . Peripheral neuropathy 07/13/2017  . Dyslipidemia 07/13/2017  . Preventative health care 01/11/2017  . Carotid artery disease ([place]) 07/01/2016  . Mitral valve disorder 07/01/2016  . Low back pain 12/31/2015  . Osteopenia 12/31/2015  . Allergic state 12/31/2015  . Skin lesion 12/31/2015  . History of chicken pox   . Calculi, ureter 03/30/2015  . OA (osteoarthritis) of knee 05/10/2014  . Pacemaker-St.Jude 02/01/2012  . Permanent atrial fibrillation ([place])   . Hypertension   . Heart murmur   . Chronic anticoagulation       Janene Harvey, PT, DPT 01/13/20 5:24 PM   [facility] [address]  [place], [place], [address] Phone: [phone]   Fax:  [phone]  Name: Brian Davidson MRN: [id number] Date of Birth: 1936-08-21

## 2020-01-15 ENCOUNTER — Ambulatory Visit: Payer: PPO

## 2020-01-15 ENCOUNTER — Other Ambulatory Visit: Payer: Self-pay

## 2020-01-15 DIAGNOSIS — M25661 Stiffness of right knee, not elsewhere classified: Secondary | ICD-10-CM

## 2020-01-15 DIAGNOSIS — R262 Difficulty in walking, not elsewhere classified: Secondary | ICD-10-CM

## 2020-01-15 DIAGNOSIS — M6281 Muscle weakness (generalized): Secondary | ICD-10-CM

## 2020-01-15 DIAGNOSIS — M25561 Pain in right knee: Secondary | ICD-10-CM | POA: Diagnosis not present

## 2020-01-15 NOTE — Therapy (Signed)
[facility] [address]  [place], [place], [address] Phone: [phone]   Fax:  [phone]  Physical Therapy Treatment  Patient Details  Name: ISSAI Davidson MRN: [id number] Date of Birth: [birth year] Referring Provider (PT): Theresa Duty, [place]   Encounter Date: 01/15/2020  PT End of Session - 01/15/20 1408    Visit Number  9    Number of Visits  13    Date for PT Re-Evaluation  01/24/20    Authorization Type  [employer]    PT Start Time  1401    PT Stop Time  1510    PT Time Calculation (min)  69 min    Activity Tolerance  Patient tolerated treatment well;Patient limited by pain    Behavior During Therapy  [facility] for tasks assessed/performed       Past Medical History:  Diagnosis Date  . Arthritis   . Biliary dyskinesia   . Carotid artery disease ([place]) 07/01/2016  . Dyslipidemia 07/13/2017  . Dysrhythmia    a fib  . GERD (gastroesophageal reflux disease)    occasional  . H/O measles   . History of chicken pox   . History of kidney stones   . Hypertension   . Neuromuscular disorder (HCC)    neuropathy feet   . Persistent atrial fibrillation ([place])   . Pneumonia 1992  . Presence of permanent cardiac pacemaker   . Tachycardia-bradycardia ([place])    a. s/p STJ dual chamber pacemaker  . Thiamine deficiency 01/19/2018  . Valvular heart disease 07/01/2016    Past Surgical History:  Procedure Laterality Date  . CHOLECYSTECTOMY N/A 11/24/2015   Procedure: LAPAROSCOPIC CHOLECYSTECTOMY;  Surgeon: Greer Pickerel, MD;  Location: [facility];  Service: General;  Laterality: N/A;  . CYSTOSCOPY  2016  . EYE SURGERY     bil cataract  . FRACTURE SURGERY  2005   left ankle  . HERNIA REPAIR     left groin, no mesh  . PERMANENT PACEMAKER INSERTION N/A 01/31/2012   SJM Accent SR RF implanted by DR Allred  . TOTAL KNEE ARTHROPLASTY Right 12/23/2019   Procedure: TOTAL KNEE ARTHROPLASTY;  Surgeon: Gaynelle Arabian, MD;   Location: [facility];  Service: Orthopedics;  Laterality: Right;  78mn    There were no vitals filed for this visit.  Subjective Assessment - 01/15/20 1407    Subjective  Pt. noting improved R lateral thigh pain after last visit's MT.    Pertinent History  pacemaker, tachycardia/bradycardia, a-fib, kidney stone, HTN, GERD, HLD, CAD, hernie repair, L ankle fx surgery    Diagnostic tests  none recent    Patient Stated Goals  work on ROM    Currently in Pain?  Yes    Pain Score  3     Pain Location  Knee    Pain Orientation  Right;Lateral    Pain Descriptors / Indicators  Dull    Pain Type  Acute pain;Surgical pain    Multiple Pain Sites  No         [facility] PT Assessment - 01/15/20 0001      AROM   AROM Assessment Site  Knee    Right/Left Knee  Right    Right Knee Flexion  108                    [facility] Adult PT Treatment/Exercise - 01/15/20 0001      Knee/Hip Exercises: Stretches   Passive Hamstring Stretch  Right;30 seconds;1 rep    Passive Hamstring Stretch Limitations  Manual with therapist     Hip Flexor Stretch  Right;1 rep;30 seconds    Hip Flexor Stretch Limitations  mod thomas hip flexor/quad stretch manual with therapist     ITB Stretch  Right;1 rep;30 seconds    ITB Stretch Limitations  Manual with therapist     Piriformis Stretch  Right;1 rep;30 seconds    Piriformis Stretch Limitations  manual with therapist    Gastroc Stretch  Right;30 seconds;2 reps    Gastroc Stretch Limitations  into wall     Other Knee/Hip Stretches  R heel prop extension stretch seated in chair x 1 min       Knee/Hip Exercises: Aerobic   Nustep  Lvl 3, 6 min (UE/LEs)      Knee/Hip Exercises: Standing   Heel Raises  Both;15 reps    Heel Raises Limitations  at TM     Knee Flexion  Right;Left;10 reps    Knee Flexion Limitations  TM rail support     Hip Flexion  Right;Left;10 reps;Knee bent    Hip Flexion Limitations  TM rail support     Wall Squat  10 reps;1 set   no complaint  of increased pain    Wall Squat Limitations  very shallow for comfort       Knee/Hip Exercises: Seated   Other Seated Knee/Hip Exercises  seated R fitter leg press (1 blue band, 1 black band) x 15 rpes       Vasopneumatic   Number Minutes Vasopneumatic   15 minutes    Vasopnuematic Location   Knee   R   Vasopneumatic Pressure  Low    Vasopneumatic Temperature   coldest temp.        Manual Therapy   Manual Therapy  Soft tissue mobilization;Myofascial release;Passive ROM;Joint mobilization    Joint Mobilization  R patellar mobs all directions - min-mod restricted     Soft tissue mobilization  STM to R lateral quad/ ITB    Myofascial Release  Manual TPR to R distal VL    Passive ROM  Manual R LE stretching (see therex flowsheet)             PT Education - 01/15/20 1505    Education Details  HEP update;  seated knee extension stretch with heel prop, seated roller to R lateral thigh    Person(s) Educated  Patient    Methods  Explanation;Demonstration;Verbal cues;Handout    Comprehension  Verbalized understanding;Returned demonstration;Verbal cues required       PT Short Term Goals - 01/06/20 1445      PT SHORT TERM GOAL #1   Title  Patient to be independent with initial HEP.    Time  2    Period  Weeks    Status  Achieved    Target Date  01/10/20        PT Long Term Goals - 01/06/20 1447      PT LONG TERM GOAL #1   Title  Patient to be independent with advanced HEP.    Time  4    Period  Weeks    Status  Partially Met   met for current     PT LONG TERM GOAL #2   Title  Patient to demonstrate R knee AROM/PROM 0-120 degrees.    Time  4    Period  Weeks    Status  On-going   AROM 8-85 degrees, PROM 5-95  degrees     PT LONG TERM GOAL #3   Title  Patient to demonstrate B LE strength >/=4+/5.    Time  4    Period  Weeks    Status  Partially Met   R hip abduction and knee flexion most limiting     PT LONG TERM GOAL #4   Title  Patient to report tolerance of  walking 1 mile without pain limiting.    Time  4    Period  Weeks    Status  On-going   able to ambulate 26mn 4-5x/day with walker     PT LONG TERM GOAL #5   Title  Patient to demonstrate symmetrical step length, knee flexion, and weight shift with LRAD.    Time  4    Period  Weeks    Status  Partially Met   lacking R TKE and demonstrating decreased step length on L LE           Plan - 01/15/20 1510    Clinical Impression Statement  KYvone Neudoing well.  Notes Improved lateral R LE comfort after manual massage last visit.  Able to tolerated standing HS curl, shallow wall squat well today without report of increased pain.  MT addressing LE flexibility with R glute, ITB, HS, quad stretch with therapist along with R patellar mobs.  Ended session with ice/compression to R knee to reduce post-exercise swelling and pain.    Comorbidities  pacemaker, tachycardia/bradycardia, a-fib, kidney stone, HTN, GERD, HLD, CAD, hernie repair, L ankle fx surgery    Rehab Potential  Good    PT Frequency  3x / week    PT Treatment/Interventions  ADLs/Self Care Home Management;Cryotherapy;Moist Heat;Balance training;Therapeutic exercise;Therapeutic activities;Functional mobility training;Stair training;Gait training;Neuromuscular re-education;Patient/family education;Manual techniques;Vasopneumatic Device;Taping;Energy conservation;Dry needling;Passive range of motion;Scar mobilization    PT Next Visit Plan  Progress R knee ROM and LE strengthening    Consulted and Agree with Plan of Care  Patient       Patient will benefit from skilled therapeutic intervention in order to improve the following deficits and impairments:  Decreased endurance, Hypomobility, Increased edema, Decreased scar mobility, Decreased activity tolerance, Decreased strength, Pain, Increased fascial restricitons, Decreased balance, Difficulty walking, Increased muscle spasms, Improper body mechanics, Decreased range of motion, Impaired  flexibility, Postural dysfunction  Visit Diagnosis: Acute pain of right knee  Stiffness of right knee, not elsewhere classified  Muscle weakness (generalized)  Difficulty in walking, not elsewhere classified     Problem List Patient Active Problem List   Diagnosis Date Noted  . Primary osteoarthritis of right knee 12/23/2019  . Symptomatic bradycardia 05/31/2019  . Vertigo 07/27/2018  . Multiple closed fractures of ribs of right side 01/21/2018  . Thiamine deficiency 01/19/2018  . Peripheral neuropathy 07/13/2017  . Dyslipidemia 07/13/2017  . Preventative health care 01/11/2017  . Carotid artery disease ([place] 07/01/2016  . Mitral valve disorder 07/01/2016  . Low back pain 12/31/2015  . Osteopenia 12/31/2015  . Allergic state 12/31/2015  . Skin lesion 12/31/2015  . History of chicken pox   . Calculi, ureter 03/30/2015  . OA (osteoarthritis) of knee 05/10/2014  . Pacemaker-St.Jude 02/01/2012  . [place]ermanent [place]   . Hypertension   . Heart murmur   . Chronic anticoagulation     MBess Harvest PTA 01/15/20 3:16 PM   [facility] [address] [place] [address]Phone: [phone]  Fax:  [phone]  Name: ABDUL BEIRNE MRN: [id number] Date of Birth: [birth year]

## 2020-01-17 ENCOUNTER — Ambulatory Visit: Payer: PPO | Admitting: Physical Therapy

## 2020-01-20 ENCOUNTER — Other Ambulatory Visit: Payer: Self-pay

## 2020-01-20 ENCOUNTER — Ambulatory Visit: Payer: PPO | Admitting: Physical Therapy

## 2020-01-20 ENCOUNTER — Encounter: Payer: Self-pay | Admitting: Physical Therapy

## 2020-01-20 DIAGNOSIS — R262 Difficulty in walking, not elsewhere classified: Secondary | ICD-10-CM

## 2020-01-20 DIAGNOSIS — M25561 Pain in right knee: Secondary | ICD-10-CM

## 2020-01-20 DIAGNOSIS — M6281 Muscle weakness (generalized): Secondary | ICD-10-CM

## 2020-01-20 DIAGNOSIS — M25661 Stiffness of right knee, not elsewhere classified: Secondary | ICD-10-CM

## 2020-01-20 NOTE — Therapy (Signed)
[facility] [address]  [place], [place], [address] Phone: [phone]   Fax:  [phone]  Physical Therapy Progress Note  Patient Details  Name: Brian Davidson MRN: [id number] Date of Birth: January 02, 1936 Referring Provider (PT): Theresa Duty, [place]   Progress Note Reporting Period 12/27/19 to 01/20/20  See note below for Objective Data and Assessment of Progress/Goals.     Encounter Date: 01/20/2020  PT End of Session - 01/20/20 1413    Visit Number  10    Number of Visits  13    Date for PT Re-Evaluation  01/24/20    Authorization Type  [employer]    PT Start Time  1315    PT Stop Time  1403    PT Time Calculation (min)  48 min    Activity Tolerance  Patient tolerated treatment well;Patient limited by pain    Behavior During Therapy  [facility] for tasks assessed/performed       Past Medical History:  Diagnosis Date  . Arthritis   . Biliary dyskinesia   . Carotid artery disease ([place]) 07/01/2016  . Dyslipidemia 07/13/2017  . Dysrhythmia    a fib  . GERD (gastroesophageal reflux disease)    occasional  . H/O measles   . History of chicken pox   . History of kidney stones   . Hypertension   . Neuromuscular disorder (HCC)    neuropathy feet   . Persistent atrial fibrillation ([place])   . Pneumonia 1992  . Presence of permanent cardiac pacemaker   . Tachycardia-bradycardia ([place])    a. s/p STJ dual chamber pacemaker  . Thiamine deficiency 01/19/2018  . Valvular heart disease 07/01/2016    Past Surgical History:  Procedure Laterality Date  . CHOLECYSTECTOMY N/A 11/24/2015   Procedure: LAPAROSCOPIC CHOLECYSTECTOMY;  Surgeon: Greer Pickerel, MD;  [facility]: [facility];  Service: General;  Laterality: N/A;  . CYSTOSCOPY  2016  . EYE SURGERY     bil cataract  . FRACTURE SURGERY  2005   left ankle  . HERNIA REPAIR     left groin, no mesh  . PERMANENT PACEMAKER INSERTION N/A 01/31/2012   SJM Accent SR RF  implanted by DR Allred  . TOTAL KNEE ARTHROPLASTY Right 12/23/2019   Procedure: TOTAL KNEE ARTHROPLASTY;  Surgeon: Gaynelle Arabian, MD;  Location: [facility];  Service: Orthopedics;  Laterality: Right;  26mn    There were no vitals filed for this visit.  Subjective Assessment - 01/20/20 1317    Subjective  Went to the PA d/t concern about weeping of the superior portion of his R knee incision. Incision was cleaned and was put on antiobiotics. Was instructed to keep it uncovered and avoid using ointments. Reporting 75% improvement since initial eval.    Patient is accompained by:  Family member   Wife   Pertinent History  pacemaker, tachycardia/bradycardia, a-fib, kidney stone, HTN, GERD, HLD, CAD, hernie repair, L ankle fx surgery    Diagnostic tests  none recent    Patient Stated Goals  work on ROM    Currently in Pain?  Yes    Pain Score  6     Pain Location  Knee    Pain Orientation  Right;Lateral    Pain Descriptors / Indicators  Aching    Pain Type  Acute pain;Surgical pain         [facility] PT Assessment - 01/20/20 0001      Assessment   Medical Diagnosis  Osteoarthritis of R knee joint, s/p R TKA    Referring Provider (PT)  Theresa Duty, PA    Onset Date/Surgical Date  12/23/19      Observation/Other Assessments   Focus on Therapeutic Outcomes (FOTO)   Knee: 61 (39% limited, 27% predicted)      AROM   AROM Assessment Site  Knee    Right/Left Knee  Right    Right Knee Extension  9    Right Knee Flexion  117      PROM   Right Knee Extension  8    Right Knee Flexion  122      Strength   Right Hip Flexion  4/5    Right Hip ABduction  4+/5    Right Hip ADduction  4+/5    Left Hip Flexion  4/5    Left Hip ABduction  4+/5    Left Hip ADduction  4+/5    Right Knee Flexion  4+/5   mild lateral knee pain   Right Knee Extension  5/5    Left Knee Flexion  4+/5    Left Knee Extension  5/5    Right Ankle Dorsiflexion  4+/5    Right Ankle Plantar Flexion  4+/5    Left  Ankle Dorsiflexion  4+/5    Left Ankle Plantar Flexion  4+/5                    [facility] Adult PT Treatment/Exercise - 01/20/20 0001      Ambulation/Gait   Ambulation Distance (Feet)  180 Feet    Assistive device  None;Straight cane    Gait Pattern  Step-through pattern;Decreased hip/knee flexion - right;Decreased weight shift to right;Trunk flexed;Right flexed knee in stance    Ambulation Surface  Level;Indoor    Gait velocity  slightly decreased    Gait Comments  gait training with and without SPC- good step length and wt shift throughout but signififcantly lacking TKE on R LE and with rounded shoulders/flexed trunk throughout      Knee/Hip Exercises: Stretches   Passive Hamstring Stretch  Right;30 seconds;1 rep    Passive Hamstring Stretch Limitations  supine with strap    Hip Flexor Stretch  Right;2 reps;30 seconds    Hip Flexor Stretch Limitations  mod thomas hip flexor/quad stretch with strap    Other Knee/Hip Stretches  R knee flexion stretch 5x10" to tolerance      Knee/Hip Exercises: Aerobic   Stationary Bike  L1 x 36mn (full revolutions)      Knee/Hip Exercises: Supine   Quad Sets  Right;Strengthening;5 sets    Quad Sets Limitations  5x10" with 1/2 bolster under ankle             PT Education - 01/20/20 1321    Education Details  educated patient and wife on signs/symptoms of infection and instructed to see MD right away if they occur; update/consolidation of HEP: access code [email or web address]   Person(s) Educated  Patient;Spouse   Wife   Methods  Explanation;Demonstration;Tactile cues;Verbal cues    Comprehension  Verbalized understanding       PT Short Term Goals - 01/20/20 1334      PT SHORT TERM GOAL #1   Title  Patient to be independent with initial HEP.    Time  2    Period  Weeks    Status  Achieved    Target Date  01/10/20        PT Long Term Goals -  01/20/20 1334      PT LONG TERM GOAL #1   Title  Patient to be independent with advanced  HEP.    Time  4    Period  Weeks    Status  Partially Met   met for current     PT LONG TERM GOAL #2   Title  Patient to demonstrate R knee AROM/PROM 0-120 degrees.    Time  4    Period  Weeks    Status  Partially Met   AROM 9-117 degrees, PROM 8-122 degrees     PT LONG TERM GOAL #3   Title  Patient to demonstrate B LE strength >/=4+/5.    Time  4    Period  Weeks    Status  Partially Met   still limited in B hip flexion     PT LONG TERM GOAL #4   Title  Patient to report tolerance of walking 1 mile without pain limiting.    Time  4    Period  Weeks    Status  On-going   able to ambulate for 15 minutes with SPC, imited by fatigue     PT LONG TERM GOAL #5   Title  Patient to demonstrate symmetrical step length, knee flexion, and weight shift with LRAD.    Time  4    Period  Weeks    Status  Partially Met   lacking R TKE and demonstrating anterior trunk lean throughout           Plan - 01/20/20 1413    Clinical Impression Statement  Patient present with wife. Reporting that he went to see his Orthopedic PA on Friday d/t concern about weeping of his R knee incision. Incision was cleaned and patient was put on antibiotics. Was instructed to keep it uncovered and avoid using ointments. Upon observation, nickel-sized wound evident over superior aspect of knee incision without drainage but with slightly green-ish hue. R LE without excessive redness, warmth, or edema. Educated patient and wife on signs and symptoms of infection. Patient reports 75% improvement since initial eval. Now demonstrating R knee AROM 9-117 degrees and PROM 8-122 degrees. Strength testing revealed improvement in R hip abduction, knee flexion, and plantarflexion strength. Most limiting in B hip flexion. Notes that he is able to ambulate for 15 minutes with SPC, limited by fatigue. Gait pattern revealing improvement in antalgia and step length, however patient still significantly lacking TKE on R LE and with  rounded shoulders/flexed trunk throughout. Updated/consolidated HEP for max benefit. Patient and wife reported understanding and without complaints at end of session.    Comorbidities  pacemaker, tachycardia/bradycardia, a-fib, kidney stone, HTN, GERD, HLD, CAD, hernia repair, L ankle fx surgery    Rehab Potential  Good    PT Frequency  3x / week    PT Treatment/Interventions  ADLs/Self Care Home Management;Cryotherapy;Moist Heat;Balance training;Therapeutic exercise;Therapeutic activities;Functional mobility training;Stair training;Gait training;Neuromuscular re-education;Patient/family education;Manual techniques;Vasopneumatic Device;Taping;Energy conservation;Dry needling;Passive range of motion;Scar mobilization    PT Next Visit Plan  Progress R knee ROM and LE strengthening    Consulted and Agree with Plan of Care  Patient       Patient will benefit from skilled therapeutic intervention in order to improve the following deficits and impairments:  Decreased endurance, Hypomobility, Increased edema, Decreased scar mobility, Decreased activity tolerance, Decreased strength, Pain, Increased fascial restricitons, Decreased balance, Difficulty walking, Increased muscle spasms, Improper body mechanics, Decreased range of motion, Impaired flexibility, Postural dysfunction  Visit Diagnosis:  Acute pain of right knee  Stiffness of right knee, not elsewhere classified  Muscle weakness (generalized)  Difficulty in walking, not elsewhere classified     Problem List Patient Active Problem List   Diagnosis Date Noted  . Primary osteoarthritis of right knee 12/23/2019  . Symptomatic bradycardia 05/31/2019  . Vertigo 07/27/2018  . Multiple closed fractures of ribs of right side 01/21/2018  . Thiamine deficiency 01/19/2018  . Peripheral neuropathy 07/13/2017  . Dyslipidemia 07/13/2017  . Preventative health care 01/11/2017  . Carotid artery disease ([place]) 07/01/2016  . Mitral valve disorder  07/01/2016  . Low back pain 12/31/2015  . Osteopenia 12/31/2015  . Allergic state 12/31/2015  . Skin lesion 12/31/2015  . History of chicken pox   . Calculi, ureter 03/30/2015  . OA (osteoarthritis) of knee 05/10/2014  . Pacemaker-St.Jude 02/01/2012  . Permanent atrial fibrillation ([place])   . Hypertension   . Heart murmur   . Chronic anticoagulation      Janene Harvey, PT, DPT 01/20/20 2:26 PM   [facility] [address]  [place], [place], [address] Phone: [phone]   Fax:  [phone]  Name: Brian Davidson MRN: [id number] Date of Birth: 11/26/1935

## 2020-01-22 ENCOUNTER — Other Ambulatory Visit: Payer: Self-pay

## 2020-01-22 ENCOUNTER — Encounter: Payer: Self-pay | Admitting: Physical Therapy

## 2020-01-22 ENCOUNTER — Ambulatory Visit: Payer: PPO | Admitting: Physical Therapy

## 2020-01-22 DIAGNOSIS — M6281 Muscle weakness (generalized): Secondary | ICD-10-CM

## 2020-01-22 DIAGNOSIS — M25661 Stiffness of right knee, not elsewhere classified: Secondary | ICD-10-CM

## 2020-01-22 DIAGNOSIS — M25561 Pain in right knee: Secondary | ICD-10-CM | POA: Diagnosis not present

## 2020-01-22 DIAGNOSIS — R262 Difficulty in walking, not elsewhere classified: Secondary | ICD-10-CM

## 2020-01-22 NOTE — Therapy (Signed)
[facility] [address]  [place], [place], [address] Phone: [phone]   Fax:  [phone]  Physical Therapy Treatment  Patient Details  Name: Brian Davidson MRN: [id number] Date of Birth: 12-30-1935 Referring Provider (PT): Brian Davidson, [place]   Encounter Date: 01/22/2020  PT End of Session - 01/22/20 1400    Visit Number  11    Number of Visits  13    Date for PT Re-Evaluation  01/24/20    Authorization Type  [employer]    PT Start Time  7017    PT Stop Time  1407    PT Time Calculation (min)  54 min    Activity Tolerance  Patient tolerated treatment well;Patient limited by pain    Behavior During Therapy  [name] for tasks assessed/performed       Past Medical History:  Diagnosis Date  . Arthritis   . Biliary dyskinesia   . Carotid artery disease ([place]) 07/01/2016  . Dyslipidemia 07/13/2017  . Dysrhythmia    a fib  . GERD (gastroesophageal reflux disease)    occasional  . H/O measles   . History of chicken pox   . History of kidney stones   . Hypertension   . Neuromuscular disorder (HCC)    neuropathy feet   . Persistent atrial fibrillation ([place])   . Pneumonia 1992  . Presence of permanent cardiac pacemaker   . Tachycardia-bradycardia ([place])    a. s/p STJ dual chamber pacemaker  . Thiamine deficiency 01/19/2018  . Valvular heart disease 07/01/2016    Past Surgical History:  Procedure Laterality Date  . CHOLECYSTECTOMY N/A 11/24/2015   Procedure: LAPAROSCOPIC CHOLECYSTECTOMY;  Surgeon: Greer Pickerel, MD;  Location: [facility];  Service: General;  Laterality: N/A;  . CYSTOSCOPY  2016  . EYE SURGERY     bil cataract  . FRACTURE SURGERY  2005   left ankle  . HERNIA REPAIR     left groin, no mesh  . PERMANENT PACEMAKER INSERTION N/A 01/31/2012   SJM Accent SR RF implanted by DR Allred  . TOTAL KNEE ARTHROPLASTY Right 12/23/2019   Procedure: TOTAL KNEE ARTHROPLASTY;  Surgeon: Gaynelle Arabian, MD;   Location: [facility];  Service: Orthopedics;  Laterality: Right;  40mn    There were no vitals filed for this visit.  Subjective Assessment - 01/22/20 1314    Subjective  Went to see his PA for a wound check- was advised to use Neoosporin and a bandaid as the wound was no longer draining.    Pertinent History  pacemaker, tachycardia/bradycardia, a-fib, kidney stone, HTN, GERD, HLD, CAD, hernia repair, L ankle fx surgery    Diagnostic tests  none recent    Patient Stated Goals  work on ROM    Currently in Pain?  Yes    Pain Score  4     Pain Location  Knee    Pain Orientation  Right;Lateral    Pain Descriptors / Indicators  Aching    Pain Type  Acute pain;Surgical pain         [facility] PT Assessment - 01/22/20 0001      AROM   Right Knee Extension  8      PROM   Right Knee Extension  6                    [facility] Adult PT Treatment/Exercise - 01/22/20 0001      Knee/Hip Exercises: Aerobic  Stationary Bike  L1 x 74mn (full revolutions)      Knee/Hip Exercises: Standing   Terminal Knee Extension  Strengthening;Right;1 set;10 reps;Theraband    Theraband Level (Terminal Knee Extension)  Level 4 (Blue)    Terminal Knee Extension Limitations  10x3" holding onto back of chair    Hip Abduction  Stengthening;Right;Left;1 set;10 reps;Knee straight    Abduction Limitations  yellow loop around ankles   cues to avoid lateral trunk lean with good carryover   Hip Extension  Stengthening;Right;Left;1 set;10 reps;Knee straight    Extension Limitations  yellow loop around ankles    Lateral Step Up  Right;1 set;10 reps;Step Height: 6";Hand Hold: 2    Lateral Step Up Limitations  at TM rail; R up, L down   cues for foot placement and slow eccentric lower     Knee/Hip Exercises: Seated   Long Arc Quad  Right;1 set;Strengthening;10 reps    Long Arc Quad Weight  3 lbs.      Knee/Hip Exercises: Supine   Quad Sets  Right;Strengthening;10 reps    Quad Sets Limitations  10x10" with  folder pillow under heel      Vasopneumatic   Number Minutes Vasopneumatic   10 minutes    Vasopnuematic Location   Knee   R   Vasopneumatic Pressure  Low    Vasopneumatic Temperature   coldest temp.        Manual Therapy   Manual Therapy  Joint mobilization    Manual therapy comments  supine    Joint Mobilization  R patellar mobs grade IV all directions - initially restricted moderately in superior and medial directions which improved;  educated patient and Brian on self-patellar glides in all directions to tolerance; R knee femur PA's for extension 5x20" oscillateions grade III/IV              PT Education - 01/22/20 1400    Education Details  update to HEP    Person(s) Educated  Patient;Spouse   Brian Davidson  Methods  Explanation;Demonstration;Tactile cues;Verbal cues;Handout    Comprehension  Verbalized understanding;Returned demonstration       PT Short Term Goals - 01/20/20 1334      PT SHORT TERM GOAL #1   Title  Patient to be independent with initial HEP.    Time  2    Period  Weeks    Status  Achieved    Target Date  01/10/20        PT Long Term Goals - 01/20/20 1334      PT LONG TERM GOAL #1   Title  Patient to be independent with advanced HEP.    Time  4    Period  Weeks    Status  Partially Met   met for current     PT LONG TERM GOAL #2   Title  Patient to demonstrate R knee AROM/PROM 0-120 degrees.    Time  4    Period  Weeks    Status  Partially Met   AROM 9-117 degrees, PROM 8-122 degrees     PT LONG TERM GOAL #3   Title  Patient to demonstrate B LE strength >/=4+/5.    Time  4    Period  Weeks    Status  Partially Met   still limited in B hip flexion     PT LONG TERM GOAL #4   Title  Patient to report tolerance of walking 1 mile without pain limiting.  Time  4    Period  Weeks    Status  On-going   able to ambulate for 15 minutes with SPC, imited by fatigue     PT LONG TERM GOAL #5   Title  Patient to demonstrate symmetrical  step length, knee flexion, and weight shift with LRAD.    Time  4    Period  Weeks    Status  Partially Met   lacking R TKE and demonstrating anterior trunk lean throughout           Plan - 01/22/20 1400    Clinical Impression Statement  Patient present with Brian. Went to see his Orthopedic PA for a knee incision wound check yesterday- was advised to cover it with Neosporin and a band aid. Bandaid covering superior aspect of R knee incision appearing damp today, however patient reporting that this is Neosporin. Worked on R knee patellar and knee extension mobility with patient tolerating this well despite pain. Was able to reach R knee extension AROM to 8 degrees, with PROM to 6 degrees. Followed with quad strengthening, with patient demonstrating good carryover of knee extension ROM with TKE. Worked on lateral step up/downs with patient tolerating this exercise well despite 1 episode of R knee buckling. Able to regain balance independently. Ended session with Gameready to R knee for pain relief. No complaints at end of session.    Comorbidities  pacemaker, tachycardia/bradycardia, a-fib, kidney stone, HTN, GERD, HLD, CAD, hernia repair, L ankle fx surgery    Rehab Potential  Good    PT Frequency  3x / week    PT Treatment/Interventions  ADLs/Self Care Home Management;Cryotherapy;Moist Heat;Balance training;Therapeutic exercise;Therapeutic activities;Functional mobility training;Stair training;Gait training;Neuromuscular re-education;Patient/family education;Manual techniques;Vasopneumatic Device;Taping;Energy conservation;Dry needling;Passive range of motion;Scar mobilization    PT Next Visit Plan  Progress R knee ROM and LE strengthening    Consulted and Agree with Plan of Care  Patient       Patient will benefit from skilled therapeutic intervention in order to improve the following deficits and impairments:  Decreased endurance, Hypomobility, Increased edema, Decreased scar mobility,  Decreased activity tolerance, Decreased strength, Pain, Increased fascial restricitons, Decreased balance, Difficulty walking, Increased muscle spasms, Improper body mechanics, Decreased range of motion, Impaired flexibility, Postural dysfunction  Visit Diagnosis: Acute pain of right knee  Stiffness of right knee, not elsewhere classified  Muscle weakness (generalized)  Difficulty in walking, not elsewhere classified     Problem List Patient Active Problem List   Diagnosis Date Noted  . Primary osteoarthritis of right knee 12/23/2019  . Symptomatic bradycardia 05/31/2019  . Vertigo 07/27/2018  . Multiple closed fractures of ribs of right side 01/21/2018  . Thiamine deficiency 01/19/2018  . Peripheral neuropathy 07/13/2017  . Dyslipidemia 07/13/2017  . Preventative health care 01/11/2017  . Carotid artery disease ([place]) 07/01/2016  . Mitral valve disorder 07/01/2016  . Low back pain 12/31/2015  . Osteopenia 12/31/2015  . Allergic state 12/31/2015  . Skin lesion 12/31/2015  . History of chicken pox   . Calculi, ureter 03/30/2015  . OA (osteoarthritis) of knee 05/10/2014  . Pacemaker-St.Jude 02/01/2012  . Permanent atrial fibrillation ([place])   . Hypertension   . Heart murmur   . Chronic anticoagulation      Janene Harvey, PT, DPT 01/22/20 2:14 PM   [facility] [address]  [place], [place], [address] Phone: [phone]   Fax:  [phone]  Name: Brian Davidson MRN: [id number]  Date of Birth: 28-Jun-1936

## 2020-01-24 ENCOUNTER — Encounter: Payer: Self-pay | Admitting: Physical Therapy

## 2020-01-24 ENCOUNTER — Ambulatory Visit: Payer: PPO | Admitting: Physical Therapy

## 2020-01-24 ENCOUNTER — Other Ambulatory Visit: Payer: Self-pay

## 2020-01-24 DIAGNOSIS — M6281 Muscle weakness (generalized): Secondary | ICD-10-CM

## 2020-01-24 DIAGNOSIS — R262 Difficulty in walking, not elsewhere classified: Secondary | ICD-10-CM

## 2020-01-24 DIAGNOSIS — M25561 Pain in right knee: Secondary | ICD-10-CM

## 2020-01-24 DIAGNOSIS — M25661 Stiffness of right knee, not elsewhere classified: Secondary | ICD-10-CM

## 2020-01-24 NOTE — Therapy (Signed)
[facility] [address]  [place], [place], [address] Phone: [phone]   Fax:  [phone]  Physical Therapy Treatment  Patient Details  Name: Brian Davidson MRN: [id number] Date of Birth: 1936-04-21 Referring Provider (PT): Theresa Duty, [place]   Encounter Date: 01/24/2020  PT End of Session - 01/24/20 1149    Visit Number  12    Number of Visits  18    Date for PT Re-Evaluation  02/14/20    Authorization Type  [employer]    PT Start Time  1102    PT Stop Time  1156    PT Time Calculation (min)  54 min    Activity Tolerance  Patient tolerated treatment well;Patient limited by pain    Behavior During Therapy  [facility] for tasks assessed/performed       Past Medical History:  Diagnosis Date  . Arthritis   . Biliary dyskinesia   . Carotid artery disease ([place]) 07/01/2016  . Dyslipidemia 07/13/2017  . Dysrhythmia    a fib  . GERD (gastroesophageal reflux disease)    occasional  . H/O measles   . History of chicken pox   . History of kidney stones   . Hypertension   . Neuromuscular disorder (HCC)    neuropathy feet   . Persistent atrial fibrillation ([place])   . Pneumonia 1992  . Presence of permanent cardiac pacemaker   . Tachycardia-bradycardia ([place])    a. s/p STJ dual chamber pacemaker  . Thiamine deficiency 01/19/2018  . Valvular heart disease 07/01/2016    Past Surgical History:  Procedure Laterality Date  . CHOLECYSTECTOMY N/A 11/24/2015   Procedure: LAPAROSCOPIC CHOLECYSTECTOMY;  Surgeon: Greer Pickerel, MD;  Location: [facility];  Service: General;  Laterality: N/A;  . CYSTOSCOPY  2016  . EYE SURGERY     bil cataract  . FRACTURE SURGERY  2005   left ankle  . HERNIA REPAIR     left groin, no mesh  . PERMANENT PACEMAKER INSERTION N/A 01/31/2012   SJM Accent SR RF implanted by DR Allred  . TOTAL KNEE ARTHROPLASTY Right 12/23/2019   Procedure: TOTAL KNEE ARTHROPLASTY;  Surgeon: Gaynelle Arabian, MD;   Location: [facility];  Service: Orthopedics;  Laterality: Right;  44mn    There were no vitals filed for this visit.  Subjective Assessment - 01/24/20 1103    Subjective  Doing better. Not much new.    Patient is accompained by:  Family member    Diagnostic tests  none recent    Patient Stated Goals  work on ROM    Currently in Pain?  Yes    Pain Score  3     Pain Location  Knee    Pain Orientation  Right;Lateral    Pain Descriptors / Indicators  Aching    Pain Type  Acute pain;Surgical pain         [facility] PT Assessment - 01/24/20 0001      Assessment   Medical Diagnosis  Osteoarthritis of R knee joint, s/p R TKA    Referring Provider (PT)  KTheresa Duty PA    Onset Date/Surgical Date  12/23/19      AROM   Right Knee Extension  6    Right Knee Flexion  115      PROM   Right Knee Extension  5    Right Knee Flexion  119      Strength   Overall Strength Comments  *carried  over from 01/22/20    Right Hip Flexion  4/5    Right Hip ABduction  4+/5    Right Hip ADduction  4+/5    Left Hip Flexion  4/5    Left Hip ABduction  4+/5    Left Hip ADduction  4+/5    Right Knee Flexion  4+/5    Right Knee Extension  5/5    Left Knee Flexion  4+/5    Left Knee Extension  5/5    Right Ankle Dorsiflexion  4+/5    Right Ankle Plantar Flexion  4+/5    Left Ankle Dorsiflexion  4+/5    Left Ankle Plantar Flexion  4+/5                    [facility] Adult PT Treatment/Exercise - 01/24/20 0001      Ambulation/Gait   Stairs  Yes    Stairs Assistance  4: Min guard    Stair Management Technique  One rail Left;One rail Right;With cane    Number of Stairs  13    Height of Stairs  8    Gait Comments  able to demonstrate reciprocal alternating pattern with 1 handrail and SPC ascending, and 1 handrail descending with cues to decrease speed and with poor eccentric control on R LE      Knee/Hip Exercises: Machines for Strengthening   Cybex Knee Extension  L LE 10x 10#    Cybex  Knee Flexion  B LEs 10x 35#      Knee/Hip Exercises: Standing   Lateral Step Up  Right;1 set;10 reps;Step Height: 6";Hand Hold: 2    Lateral Step Up Limitations  at counter; R up, L down   cues for upright trunk   Forward Step Up  Right;2 sets;5 reps;Step Height: 6";Hand Hold: 1;Step Height: 4"    Forward Step Up Limitations  R step up, L step over; cues to hit heel down first and control eccentric lower      Knee/Hip Exercises: Supine   Heel Slides  Right;AAROM;10 reps    Heel Slides Limitations  10x3" with strap and and orange p-ball       Vasopneumatic   Number Minutes Vasopneumatic   10 minutes    Vasopnuematic Location   Knee   R   Vasopneumatic Pressure  Low    Vasopneumatic Temperature   coldest temp.        Manual Therapy   Manual Therapy  Joint mobilization    Manual therapy comments  supine    Joint Mobilization  R patellar mobs grade IV all directions - initially restricted moderately in superior and medial directions which improved;  educated patient and wife on self-patellar glides in all directions to tolerance; R knee femur PA's for extension 5x20" oscillateions grade III/IV              PT Education - 01/24/20 1149    Education Details  discussion on objective progress and remaining impairments    Person(s) Educated  Patient    Methods  Explanation;Demonstration;Tactile cues;Verbal cues    Comprehension  Verbalized understanding       PT Short Term Goals - 01/20/20 1334      PT SHORT TERM GOAL #1   Title  Patient to be independent with initial HEP.    Time  2    Period  Weeks    Status  Achieved    Target Date  01/10/20  PT Long Term Goals - 01/24/20 1159      PT LONG TERM GOAL #1   Title  Patient to be independent with advanced HEP.    Time  3    Period  Weeks    Status  Partially Met   met for current   Target Date  02/14/20      PT LONG TERM GOAL #2   Title  Patient to demonstrate R knee AROM/PROM 0-120 degrees.    Time  3     Period  Weeks    Status  Partially Met   knee AROM 6-115 degrees, and PROM 5-119 degrees   Target Date  02/14/20      PT LONG TERM GOAL #3   Title  Patient to demonstrate B LE strength >/=4+/5.    Time  3    Period  Weeks    Status  Partially Met   still limited in B hip flexion   Target Date  02/14/20      PT LONG TERM GOAL #4   Title  Patient to report tolerance of walking 1 mile without pain limiting.    Time  3    Period  Weeks    Status  On-going   able to ambulate for 15 minutes with SPC, imited by fatigue   Target Date  02/14/20      PT LONG TERM GOAL #5   Title  Patient to demonstrate symmetrical step length, knee flexion, and weight shift with LRAD.    Time  3    Period  Weeks    Status  Partially Met   lacking R TKE and demonstrating anterior trunk lean throughout   Target Date  02/14/20            Plan - 01/24/20 1152    Clinical Impression Statement  Patient present with wife, without new concerns today. Worked on patellar mobility and knee extension tibiofemoral mobility with patient still demonstrating hypomobility with mobilizations. However, was able to reach R knee AROM 6-115 degrees, and PROM 5-119 degrees after MT. This was carried over with ambulation as patient demonstrating improvement in TKE at knee strike and throughout stance phase. Assessed stair navigation with patient demonstrating ability to perform reciprocal alternating pattern with cues required to decrease speed and with poor eccentric control on R LE. Continued working on step up exercises to address functional quad weakness. Ended session with Gameready to R knee foe pain relief. Patient is demonstrating good progress with R knee ROM, gait, and functional activity tolerance. Would benefit from continued skilled PT services 2x/week for 3 weeks to address remaining goals.    Comorbidities  pacemaker, tachycardia/bradycardia, a-fib, kidney stone, HTN, GERD, HLD, CAD, hernia repair, L ankle fx  surgery    Rehab Potential  Good    PT Frequency  2x / week    PT Duration  3 weeks    PT Treatment/Interventions  ADLs/Self Care Home Management;Cryotherapy;Moist Heat;Balance training;Therapeutic exercise;Therapeutic activities;Functional mobility training;Stair training;Gait training;Neuromuscular re-education;Patient/family education;Manual techniques;Vasopneumatic Device;Taping;Energy conservation;Dry needling;Passive Davidson of motion;Scar mobilization    PT Next Visit Plan  Progress R knee ROM and LE strengthening    Consulted and Agree with Plan of Care  Patient       Patient will benefit from skilled therapeutic intervention in order to improve the following deficits and impairments:  Decreased endurance, Hypomobility, Increased edema, Decreased scar mobility, Decreased activity tolerance, Decreased strength, Pain, Increased fascial restricitons, Decreased balance, Difficulty walking, Increased muscle spasms, Improper  body mechanics, Decreased Davidson of motion, Impaired flexibility, Postural dysfunction  Visit Diagnosis: Acute pain of right knee  Stiffness of right knee, not elsewhere classified  Muscle weakness (generalized)  Difficulty in walking, not elsewhere classified     Problem List Patient Active Problem List   Diagnosis Date Noted  . Primary osteoarthritis of right knee 12/23/2019  . Symptomatic bradycardia 05/31/2019  . Vertigo 07/27/2018  . Multiple closed fractures of ribs of right side 01/21/2018  . Thiamine deficiency 01/19/2018  . Peripheral neuropathy 07/13/2017  . Dyslipidemia 07/13/2017  . Preventative health care 01/11/2017  . Carotid artery disease ([place]) 07/01/2016  . Mitral valve disorder 07/01/2016  . Low back pain 12/31/2015  . Osteopenia 12/31/2015  . Allergic state 12/31/2015  . Skin lesion 12/31/2015  . History of chicken pox   . Calculi, ureter 03/30/2015  . OA (osteoarthritis) of knee 05/10/2014  . Pacemaker-St.Jude 02/01/2012  .  Permanent atrial fibrillation ([place])   . Hypertension   . Heart murmur   . Chronic anticoagulation       Janene Harvey, PT, DPT 01/24/20 12:01 PM   [facility] [address]  [place], [place], [address] Phone: [phone]   Fax:  [phone]  Name: Brian Davidson MRN: [id number] Date of Birth: 1936/05/07

## 2020-01-27 ENCOUNTER — Other Ambulatory Visit: Payer: Self-pay

## 2020-01-27 ENCOUNTER — Ambulatory Visit: Payer: PPO

## 2020-01-27 DIAGNOSIS — M25561 Pain in right knee: Secondary | ICD-10-CM

## 2020-01-27 DIAGNOSIS — M25661 Stiffness of right knee, not elsewhere classified: Secondary | ICD-10-CM

## 2020-01-27 DIAGNOSIS — M6281 Muscle weakness (generalized): Secondary | ICD-10-CM

## 2020-01-27 DIAGNOSIS — R262 Difficulty in walking, not elsewhere classified: Secondary | ICD-10-CM

## 2020-01-27 NOTE — Therapy (Signed)
[facility] [address]  [place], [place], [address] Phone: [phone]   Fax:  [phone]  Physical Therapy Treatment  Patient Details  Name: Brian Davidson MRN: [id number] Date of Birth: 04-20-1936 Referring Provider (PT): Theresa Duty, [place]   Encounter Date: 01/27/2020  PT End of Session - 01/27/20 1018    Visit Number  13    Number of Visits  18    Date for PT Re-Evaluation  02/14/20    Authorization Type  [employer]    PT Start Time  1013    PT Stop Time  1110    PT Time Calculation (min)  57 min    Activity Tolerance  Patient tolerated treatment well;Patient limited by pain    Behavior During Therapy  [facility] for tasks assessed/performed       Past Medical History:  Diagnosis Date  . Arthritis   . Biliary dyskinesia   . Carotid artery disease ([place]) 07/01/2016  . Dyslipidemia 07/13/2017  . Dysrhythmia    a fib  . GERD (gastroesophageal reflux disease)    occasional  . H/O measles   . History of chicken pox   . History of kidney stones   . Hypertension   . Neuromuscular disorder (HCC)    neuropathy feet   . Persistent atrial fibrillation ([place])   . Pneumonia 1992  . Presence of permanent cardiac pacemaker   . Tachycardia-bradycardia ([place])    a. s/p STJ dual chamber pacemaker  . Thiamine deficiency 01/19/2018  . Valvular heart disease 07/01/2016    Past Surgical History:  Procedure Laterality Date  . CHOLECYSTECTOMY N/A 11/24/2015   Procedure: LAPAROSCOPIC CHOLECYSTECTOMY;  Surgeon: Greer Pickerel, MD;  Location: [facility];  Service: General;  Laterality: N/A;  . CYSTOSCOPY  2016  . EYE SURGERY     bil cataract  . FRACTURE SURGERY  2005   left ankle  . HERNIA REPAIR     left groin, no mesh  . PERMANENT PACEMAKER INSERTION N/A 01/31/2012   SJM Accent SR RF implanted by DR Allred  . TOTAL KNEE ARTHROPLASTY Right 12/23/2019   Procedure: TOTAL KNEE ARTHROPLASTY;  Surgeon: Gaynelle Arabian, MD;   Location: [facility];  Service: Orthopedics;  Laterality: Right;  32mn    There were no vitals filed for this visit.  Subjective Assessment - 01/27/20 1234    Subjective  Doing ok.    Pertinent History  pacemaker, tachycardia/bradycardia, a-fib, kidney stone, HTN, GERD, HLD, CAD, hernia repair, L ankle fx surgery    Diagnostic tests  none recent    Patient Stated Goals  work on ROM    Currently in Pain?  Yes    Pain Score  1     Pain Location  Knee    Pain Orientation  Right;Lateral    Pain Descriptors / Indicators  Aching    Pain Type  Acute pain;Surgical pain    Multiple Pain Sites  No                        [facility] Adult PT Treatment/Exercise - 01/27/20 0001      Knee/Hip Exercises: Stretches   Knee: Self-Stretch to increase Flexion  Right    Knee: Self-Stretch Limitations  5" x 10 reps on self-flexion stretch at TM side bar lunge stretch     Gastroc Stretch  Right;2 reps;30 seconds    Gastroc Stretch Limitations  Prostretch  Knee/Hip Exercises: Aerobic   Stationary Bike  L1 x 12mn (full revolutions)      Knee/Hip Exercises: Machines for Strengthening   Cybex Knee Extension  L LE 15 x 10#    Cybex Knee Flexion  B LEs 12 x 35#      Knee/Hip Exercises: Standing   SLS with Vectors  Alternating cone knock over/righting without AD and close CGA/supervision from therapist 2 x 7 cones       Knee/Hip Exercises: Supine   Bridges  Both;10 reps    Bridges Limitations  3" hold    Other Supine Knee/Hip Exercises  B straight leg bridge 3" x 10 reps       Vasopneumatic   Number Minutes Vasopneumatic   10 minutes    Vasopnuematic Location   Knee   R   Vasopneumatic Pressure  Low    Vasopneumatic Temperature   coldest temp.        Manual Therapy   Manual Therapy  Joint mobilization    Manual therapy comments  supine    Joint Mobilization  R patellar mobs grade IV all directions - initially restricted moderately in superior and medial directions which improved;   educated patient and wife on self-patellar glides in all directions to tolerance; R knee femur PA's for extension 5x20" oscillateions grade III/IV                PT Short Term Goals - 01/20/20 1334      PT SHORT TERM GOAL #1   Title  Patient to be independent with initial HEP.    Time  2    Period  Weeks    Status  Achieved    Target Date  01/10/20        PT Long Term Goals - 01/24/20 1159      PT LONG TERM GOAL #1   Title  Patient to be independent with advanced HEP.    Time  3    Period  Weeks    Status  Partially Met   met for current   Target Date  02/14/20      PT LONG TERM GOAL #2   Title  Patient to demonstrate R knee AROM/PROM 0-120 degrees.    Time  3    Period  Weeks    Status  Partially Met   knee AROM 6-115 degrees, and PROM 5-119 degrees   Target Date  02/14/20      PT LONG TERM GOAL #3   Title  Patient to demonstrate B LE strength >/=4+/5.    Time  3    Period  Weeks    Status  Partially Met   still limited in B hip flexion   Target Date  02/14/20      PT LONG TERM GOAL #4   Title  Patient to report tolerance of walking 1 mile without pain limiting.    Time  3    Period  Weeks    Status  On-going   able to ambulate for 15 minutes with SPC, imited by fatigue   Target Date  02/14/20      PT LONG TERM GOAL #5   Title  Patient to demonstrate symmetrical step length, knee flexion, and weight shift with LRAD.    Time  3    Period  Weeks    Status  Partially Met   lacking R TKE and demonstrating anterior trunk lean throughout   Target Date  02/14/20  Plan - 01/27/20 1228    Clinical Impression Statement  Brian Davidson reporting that his wife has been helping him with, "moving kneecap around" and extending his knee.  Wife continues to be very attentive with PT sessions.  MT performed for improved knee extension/flexion ROM and still with min restriction of R patellar mobility which improved with MT.  Added hooklying and straight leg  bridge, Dynamic SLS balance with cone drills, and progressed LE machine strengthening without issue.  Pt. reporting typical pain increases which resolved to baseline with rest.  Pt. does require intermittent cueing with therex for proper pacing and general technique with good carryover.  Ended session with ice compression to R knee to reduce post-exercise stiffness and swelling.    Comorbidities  pacemaker, tachycardia/bradycardia, a-fib, kidney stone, HTN, GERD, HLD, CAD, hernia repair, L ankle fx surgery    Rehab Potential  Good    PT Frequency  2x / week    PT Treatment/Interventions  ADLs/Self Care Home Management;Cryotherapy;Moist Heat;Balance training;Therapeutic exercise;Therapeutic activities;Functional mobility training;Stair training;Gait training;Neuromuscular re-education;Patient/family education;Manual techniques;Vasopneumatic Device;Taping;Energy conservation;Dry needling;Passive range of motion;Scar mobilization    PT Next Visit Plan  Progress R knee ROM and LE strengthening    Consulted and Agree with Plan of Care  Patient       Patient will benefit from skilled therapeutic intervention in order to improve the following deficits and impairments:  Decreased endurance, Hypomobility, Increased edema, Decreased scar mobility, Decreased activity tolerance, Decreased strength, Pain, Increased fascial restricitons, Decreased balance, Difficulty walking, Increased muscle spasms, Improper body mechanics, Decreased range of motion, Impaired flexibility, Postural dysfunction  Visit Diagnosis: Acute pain of right knee  Stiffness of right knee, not elsewhere classified  Muscle weakness (generalized)  Difficulty in walking, not elsewhere classified     Problem List Patient Active Problem List   Diagnosis Date Noted  . Primary osteoarthritis of right knee 12/23/2019  . Symptomatic bradycardia 05/31/2019  . Vertigo 07/27/2018  . Multiple closed fractures of ribs of right side 01/21/2018   . Thiamine deficiency 01/19/2018  . Peripheral neuropathy 07/13/2017  . Dyslipidemia 07/13/2017  . Preventative health care 01/11/2017  . Carotid artery disease ([place]) 07/01/2016  . Mitral valve disorder 07/01/2016  . Low back pain 12/31/2015  . Osteopenia 12/31/2015  . Allergic state 12/31/2015  . Skin lesion 12/31/2015  . History of chicken pox   . Calculi, ureter 03/30/2015  . OA (osteoarthritis) of knee 05/10/2014  . Pacemaker-St.Jude 02/01/2012  . Permanent atrial fibrillation ([place])   . Hypertension   . Heart murmur   . Chronic anticoagulation     Bess Harvest, PTA 01/27/20 12:37 PM   [facility] [address]  [place], [place], [address] Phone: [phone]   Fax:  [phone]  Name: Brian Davidson MRN: [id number] Date of Birth: 1935/12/23

## 2020-01-28 DIAGNOSIS — Z471 Aftercare following joint replacement surgery: Secondary | ICD-10-CM | POA: Diagnosis not present

## 2020-01-28 DIAGNOSIS — Z96651 Presence of right artificial knee joint: Secondary | ICD-10-CM | POA: Diagnosis not present

## 2020-01-29 ENCOUNTER — Telehealth: Payer: Self-pay | Admitting: *Deleted

## 2020-01-29 NOTE — Telephone Encounter (Signed)
Patient called for samples of Xarelto.  2 bottles given.  Patient will pickup tomorrow.  Advise him to try his cardiologist to get more samples.

## 2020-01-30 ENCOUNTER — Ambulatory Visit: Payer: PPO

## 2020-01-30 ENCOUNTER — Other Ambulatory Visit: Payer: Self-pay

## 2020-01-30 DIAGNOSIS — M25561 Pain in right knee: Secondary | ICD-10-CM | POA: Diagnosis not present

## 2020-01-30 DIAGNOSIS — M6281 Muscle weakness (generalized): Secondary | ICD-10-CM

## 2020-01-30 DIAGNOSIS — R262 Difficulty in walking, not elsewhere classified: Secondary | ICD-10-CM

## 2020-01-30 DIAGNOSIS — M25661 Stiffness of right knee, not elsewhere classified: Secondary | ICD-10-CM

## 2020-01-30 NOTE — Therapy (Signed)
[facility] [address]  [place], [place], [address] Phone: [phone]   Fax:  [phone]  Physical Therapy Treatment  Patient Details  Name: Brian Davidson MRN: [id number] Date of Birth: March 28, 1936 Referring Provider (PT): Theresa Duty, [place]   Encounter Date: 01/30/2020  PT End of Session - 01/30/20 1619    Visit Number  14    Number of Visits  18    Date for PT Re-Evaluation  02/14/20    Authorization Type  [employer]    PT Start Time  1616    PT Stop Time  1712    PT Time Calculation (min)  56 min    Activity Tolerance  Patient tolerated treatment well;Patient limited by pain    Behavior During Therapy  [facility] for tasks assessed/performed       Past Medical History:  Diagnosis Date  . Arthritis   . Biliary dyskinesia   . Carotid artery disease ([place]) 07/01/2016  . Dyslipidemia 07/13/2017  . Dysrhythmia    a fib  . GERD (gastroesophageal reflux disease)    occasional  . H/O measles   . History of chicken pox   . History of kidney stones   . Hypertension   . Neuromuscular disorder (HCC)    neuropathy feet   . Persistent atrial fibrillation ([place])   . Pneumonia 1992  . Presence of permanent cardiac pacemaker   . Tachycardia-bradycardia ([place])    a. s/p STJ dual chamber pacemaker  . Thiamine deficiency 01/19/2018  . Valvular heart disease 07/01/2016    Past Surgical History:  Procedure Laterality Date  . CHOLECYSTECTOMY N/A 11/24/2015   Procedure: LAPAROSCOPIC CHOLECYSTECTOMY;  Surgeon: Greer Pickerel, MD;  Location: [facility];  Service: General;  Laterality: N/A;  . CYSTOSCOPY  2016  . EYE SURGERY     bil cataract  . FRACTURE SURGERY  2005   left ankle  . HERNIA REPAIR     left groin, no mesh  . PERMANENT PACEMAKER INSERTION N/A 01/31/2012   SJM Accent SR RF implanted by DR Allred  . TOTAL KNEE ARTHROPLASTY Right 12/23/2019   Procedure: TOTAL KNEE ARTHROPLASTY;  Surgeon: Gaynelle Arabian, MD;   Location: [facility];  Service: Orthopedics;  Laterality: Right;  47mn    There were no vitals filed for this visit.  Subjective Assessment - 01/30/20 1621    Subjective  Pt. noting knee is feeling some improvement.    Pertinent History  pacemaker, tachycardia/bradycardia, a-fib, kidney stone, HTN, GERD, HLD, CAD, hernia repair, L ankle fx surgery    Diagnostic tests  none recent    Patient Stated Goals  work on ROM    Currently in Pain?  Yes    Pain Score  1     Pain Location  Knee    Pain Orientation  Right    Pain Descriptors / Indicators  Tightness                        [facility] Adult PT Treatment/Exercise - 01/30/20 0001      Knee/Hip Exercises: Stretches   Passive Hamstring Stretch  Right;30 seconds;1 rep    Passive Hamstring Stretch Limitations  supine with strap      Knee/Hip Exercises: Aerobic   Stationary Bike  L1 x 637m (full revolutions)      Knee/Hip Exercises: Machines for Strengthening   Cybex Leg Press  B LEs: 35# x 10  Knee/Hip Exercises: Standing   Heel Raises  Both;20 reps    Heel Raises Limitations  + quad set at TM rail     Forward Step Up  Right;10 reps;Step Height: 4";Hand Hold: 1    Forward Step Up Limitations  + blue TB TKE at TM rail       Knee/Hip Exercises: Supine   Bridges  Both;Strengthening   x 12 reps    Bridges Limitations  3" hold      Vasopneumatic   Number Minutes Vasopneumatic   10 minutes    Vasopnuematic Location   Knee   R   Vasopneumatic Pressure  Low    Vasopneumatic Temperature   coldest temp.        Manual Therapy   Manual Therapy  Joint mobilization    Manual therapy comments  supine    Joint Mobilization  R patellar mobs grade IV all directions                PT Short Term Goals - 01/20/20 1334      PT SHORT TERM GOAL #1   Title  Patient to be independent with initial HEP.    Time  2    Period  Weeks    Status  Achieved    Target Date  01/10/20        PT Long Term Goals - 01/24/20  1159      PT LONG TERM GOAL #1   Title  Patient to be independent with advanced HEP.    Time  3    Period  Weeks    Status  Partially Met   met for current   Target Date  02/14/20      PT LONG TERM GOAL #2   Title  Patient to demonstrate R knee AROM/PROM 0-120 degrees.    Time  3    Period  Weeks    Status  Partially Met   knee AROM 6-115 degrees, and PROM 5-119 degrees   Target Date  02/14/20      PT LONG TERM GOAL #3   Title  Patient to demonstrate B LE strength >/=4+/5.    Time  3    Period  Weeks    Status  Partially Met   still limited in B hip flexion   Target Date  02/14/20      PT LONG TERM GOAL #4   Title  Patient to report tolerance of walking 1 mile without pain limiting.    Time  3    Period  Weeks    Status  On-going   able to ambulate for 15 minutes with SPC, imited by fatigue   Target Date  02/14/20      PT LONG TERM GOAL #5   Title  Patient to demonstrate symmetrical step length, knee flexion, and weight shift with LRAD.    Time  3    Period  Weeks    Status  Partially Met   lacking R TKE and demonstrating anterior trunk lean throughout   Target Date  02/14/20            Plan - 01/30/20 1638    Clinical Impression Statement Brian Davidson doing well.  Brian Davidson feels his pain is improving.  "my wife has been pushing on my knee cap and its still stiff up and down".  MT focused on superior/inferior grade III patellar mobs for improved ext/flexion ROM - pt. mildly limited - with some improvement in mobility noted following  mobilization.  Focused session on MT/LE stretching for improved knee extension ROM and TKE/quad strengthening for carryover.  Also initiated SLS on compliant surface with good tolerance.  Ended visit with ice/compression applied to R knee to reduce post-exercise soreness and swelling.  Brian Davidson noting MD pleased with his flexion ROM however wants him to focus on improving extension ROM.  Pt. and wife verbalizing improved understanding of extension-focused  HEP activities today.  Progressing well toward goals.     Comorbidities  pacemaker, tachycardia/bradycardia, a-fib, kidney stone, HTN, GERD, HLD, CAD, hernia repair, L ankle fx surgery    Rehab Potential  Good    PT Frequency  2x / week    PT Treatment/Interventions  ADLs/Self Care Home Management;Cryotherapy;Moist Heat;Balance training;Therapeutic exercise;Therapeutic activities;Functional mobility training;Stair training;Gait training;Neuromuscular re-education;Patient/family education;Manual techniques;Vasopneumatic Device;Taping;Energy conservation;Dry needling;Passive range of motion;Scar mobilization    PT Next Visit Plan  Progress R knee ROM and LE strengthening    Consulted and Agree with Plan of Care  Patient       Patient will benefit from skilled therapeutic intervention in order to improve the following deficits and impairments:  Decreased endurance, Hypomobility, Increased edema, Decreased scar mobility, Decreased activity tolerance, Decreased strength, Pain, Increased fascial restricitons, Decreased balance, Difficulty walking, Increased muscle spasms, Improper body mechanics, Decreased range of motion, Impaired flexibility, Postural dysfunction  Visit Diagnosis: Acute pain of right knee  Stiffness of right knee, not elsewhere classified  Muscle weakness (generalized)  Difficulty in walking, not elsewhere classified     Problem List Patient Active Problem List   Diagnosis Date Noted  . Primary osteoarthritis of right knee 12/23/2019  . Symptomatic bradycardia 05/31/2019  . Vertigo 07/27/2018  . Multiple closed fractures of ribs of right side 01/21/2018  . Thiamine deficiency 01/19/2018  . Peripheral neuropathy 07/13/2017  . Dyslipidemia 07/13/2017  . Preventative health care 01/11/2017  . Carotid artery disease ([place]) 07/01/2016  . Mitral valve disorder 07/01/2016  . Low back pain 12/31/2015  . Osteopenia 12/31/2015  . Allergic state 12/31/2015  . Skin lesion  12/31/2015  . History of chicken pox   . Calculi, ureter 03/30/2015  . OA (osteoarthritis) of knee 05/10/2014  . Pacemaker-St.Jude 02/01/2012  . Permanent atrial fibrillation ([place])   . Hypertension   . Heart murmur   . Chronic anticoagulation     Brian Davidson, Brian Davidson 01/30/20 6:13 PM   [facility] [address]  [place], [place], [address] Phone: [phone]   Fax:  [phone]  Name: Brian Davidson MRN: [id number] Date of Birth: 30-Jan-1936

## 2020-02-06 ENCOUNTER — Other Ambulatory Visit: Payer: Self-pay

## 2020-02-06 ENCOUNTER — Encounter: Payer: Self-pay | Admitting: Physical Therapy

## 2020-02-06 ENCOUNTER — Ambulatory Visit: Payer: PPO | Attending: Student | Admitting: Physical Therapy

## 2020-02-06 DIAGNOSIS — R262 Difficulty in walking, not elsewhere classified: Secondary | ICD-10-CM

## 2020-02-06 DIAGNOSIS — M25661 Stiffness of right knee, not elsewhere classified: Secondary | ICD-10-CM

## 2020-02-06 DIAGNOSIS — M6281 Muscle weakness (generalized): Secondary | ICD-10-CM | POA: Diagnosis not present

## 2020-02-06 DIAGNOSIS — M25561 Pain in right knee: Secondary | ICD-10-CM

## 2020-02-06 NOTE — Therapy (Signed)
[facility] [address]  [place], [place], [address] Phone: [phone]   Fax:  [phone]  Physical Therapy Treatment  Patient Details  Name: Brian BELLAND MRN: [id number] Date of Birth: [birth year] Referring Provider (PT): Theresa Duty, [place]   Encounter Date: 02/06/2020  PT End of Session - 02/06/20 0847    Visit Number  15    Number of Visits  18    Date for PT Re-Evaluation  02/14/20    Authorization Type  [employer]    PT Start Time  0805    PT Stop Time  6222    PT Time Calculation (min)  46 min    Activity Tolerance  Patient tolerated treatment well;Patient limited by pain    Behavior During Therapy  [facility] for tasks assessed/performed       Past Medical History:  Diagnosis Date   Arthritis    Biliary dyskinesia    Carotid artery disease ([place]) 07/01/2016   Dyslipidemia 07/13/2017   Dysrhythmia    a fib   GERD (gastroesophageal reflux disease)    occasional   H/O measles    History of chicken pox    History of kidney stones    Hypertension    Neuromuscular disorder (HCC)    neuropathy feet    Persistent atrial fibrillation ([place])    Pneumonia 1992   Presence of permanent cardiac pacemaker    Tachycardia-bradycardia ([place])    a. s/p STJ dual chamber pacemaker   Thiamine deficiency 01/19/2018   Valvular heart disease 07/01/2016    Past Surgical History:  Procedure Laterality Date   CHOLECYSTECTOMY N/A 11/24/2015   Procedure: LAPAROSCOPIC CHOLECYSTECTOMY;  Surgeon: Greer Pickerel, MD;  Location: [facility];  Service: General;  Laterality: N/A;   CYSTOSCOPY  2016   EYE SURGERY     bil cataract   FRACTURE SURGERY  2005   left ankle   HERNIA REPAIR     left groin, no mesh   PERMANENT PACEMAKER INSERTION N/A 01/31/2012   SJM Accent SR RF implanted by DR Allred   TOTAL KNEE ARTHROPLASTY Right 12/23/2019   Procedure: TOTAL KNEE ARTHROPLASTY;  Surgeon: Gaynelle Arabian, MD;   Location: [facility];  Service: Orthopedics;  Laterality: Right;  7mn    There were no vitals filed for this visit.  Subjective Assessment - 02/06/20 0804    Subjective  Went to see his MD earlier this week. Was told to keep the superior incision site covered with a bandage because it is not quite healed. Feels that he will be ready to transition to home program within coming visits.    Pertinent History  pacemaker, tachycardia/bradycardia, a-fib, kidney stone, HTN, GERD, HLD, CAD, hernia repair, L ankle fx surgery    Diagnostic tests  none recent    Patient Stated Goals  work on ROM    Currently in Pain?  No/denies                        [facility] Adult PT Treatment/Exercise - 02/06/20 0001      Ambulation/Gait   Stairs  Yes    Stairs Assistance  4: Min guard    Stair Management Technique  One rail Left    Number of Stairs  13    Height of Stairs  8    Gait Comments  L knee buckling when ascending stairs, requiring heavy handrail relieance; poor quad control on L vs. R when  descending      Knee/Hip Exercises: Stretches   Passive Hamstring Stretch  Right;30 seconds;2 reps    Passive Hamstring Stretch Limitations  supine with strap    Hip Flexor Stretch  Right;2 reps;30 seconds    Hip Flexor Stretch Limitations  mod thomas hip flexor/quad stretch with strap      Knee/Hip Exercises: Aerobic   Stationary Bike  L1 x 37mn (full revolutions)      Knee/Hip Exercises: Machines for Strengthening   Cybex Knee Extension  R LE 2x10 13#   good control   Cybex Knee Flexion  R LE 2x10 x 20#      Vasopneumatic   Number Minutes Vasopneumatic   10 minutes    Vasopnuematic Location   Knee   R   Vasopneumatic Pressure  Low    Vasopneumatic Temperature   coldest temp.        Manual Therapy   Manual Therapy  Joint mobilization    Manual therapy comments  supine    Joint Mobilization  R patellar mobs grade IV all directions; R femur PAs grade III/IV to tolerance for extension with  1/2 bolster under ankles 5x20"             PT Education - 02/06/20 0846    Education Details  edu on ther-ex for quad strengthening of L knee    Person(s) Educated  Patient    Methods  Explanation;Demonstration;Tactile cues;Verbal cues    Comprehension  Verbalized understanding       PT Short Term Goals - 01/20/20 1334      PT SHORT TERM GOAL #1   Title  Patient to be independent with initial HEP.    Time  2    Period  Weeks    Status  Achieved    Target Date  01/10/20        PT Long Term Goals - 01/24/20 1159      PT LONG TERM GOAL #1   Title  Patient to be independent with advanced HEP.    Time  3    Period  Weeks    Status  Partially Met   met for current   Target Date  02/14/20      PT LONG TERM GOAL #2   Title  Patient to demonstrate R knee AROM/PROM 0-120 degrees.    Time  3    Period  Weeks    Status  Partially Met   knee AROM 6-115 degrees, and PROM 5-119 degrees   Target Date  02/14/20      PT LONG TERM GOAL #3   Title  Patient to demonstrate B LE strength >/=4+/5.    Time  3    Period  Weeks    Status  Partially Met   still limited in B hip flexion   Target Date  02/14/20      PT LONG TERM GOAL #4   Title  Patient to report tolerance of walking 1 mile without pain limiting.    Time  3    Period  Weeks    Status  On-going   able to ambulate for 15 minutes with SPC, imited by fatigue   Target Date  02/14/20      PT LONG TERM GOAL #5   Title  Patient to demonstrate symmetrical step length, knee flexion, and weight shift with LRAD.    Time  3    Period  Weeks    Status  Partially Met  lacking R TKE and demonstrating anterior trunk lean throughout   Target Date  02/14/20            Plan - 02/06/20 0848    Clinical Impression Statement  Patient noting that he saw his MD earlier this week and was advised to keep the R knee superior incision site covered with a bandage as it is not quite healed. Feels that he will be ready to  transition to home program within coming visits. Worked on LE stretching before proceeding to work on R knee joint mobility as patient still limited in knee extension. Patient demonstrated mild hypomobility in superior and inferior glides of patella. Patient performed progressive LE machine strengthening with good demonstration of muscle control. Encouraged patient to continue strength training in a gym environment after D/C as patient reporting that his wife is already a member of the [employer]. Worked on stair training with patient demonstrating reciprocal alternating pattern, but today with L knee buckling when ascending stairs, requiring heavy handrail reliance. Patient also with poor quad control on L vs. R when descending stairs today. D/t patients previous problems with L knee pain, educated patient on LE strengthening ther-ex to improve quad stability of the non-surgical knee. Patient reported understanding. Ended session with [employer] to R knee per patients request. No complaints at end of session.    Comorbidities  pacemaker, tachycardia/bradycardia, a-fib, kidney stone, HTN, GERD, HLD, CAD, hernia repair, L ankle fx surgery    Rehab Potential  Good    PT Frequency  2x / week    PT Treatment/Interventions  ADLs/Self Care Home Management;Cryotherapy;Moist Heat;Balance training;Therapeutic exercise;Therapeutic activities;Functional mobility training;Stair training;Gait training;Neuromuscular re-education;Patient/family education;Manual techniques;Vasopneumatic Device;Taping;Energy conservation;Dry needling;Passive range of motion;Scar mobilization    PT Next Visit Plan  Progress R knee ROM and LE strengthening    Consulted and Agree with Plan of Care  Patient       Patient will benefit from skilled therapeutic intervention in order to improve the following deficits and impairments:  Decreased endurance, Hypomobility, Increased edema, Decreased scar mobility, Decreased activity tolerance, Decreased  strength, Pain, Increased fascial restricitons, Decreased balance, Difficulty walking, Increased muscle spasms, Improper body mechanics, Decreased range of motion, Impaired flexibility, Postural dysfunction  Visit Diagnosis: Acute pain of right knee  Stiffness of right knee, not elsewhere classified  Muscle weakness (generalized)  Difficulty in walking, not elsewhere classified     Problem List Patient Active Problem List   Diagnosis Date Noted   Primary osteoarthritis of right knee 12/23/2019   Symptomatic bradycardia 05/31/2019   Vertigo 07/27/2018   Multiple closed fractures of ribs of right side 01/21/2018   Thiamine deficiency 01/19/2018   Peripheral neuropathy 07/13/2017   Dyslipidemia 07/13/2017   Preventative health care 01/11/2017   Carotid artery disease ([place]) 07/01/2016   Mitral valve disorder 07/01/2016   Low back pain 12/31/2015   Osteopenia 12/31/2015   Allergic state 12/31/2015   Skin lesion 12/31/2015   History of chicken pox    Calculi, ureter 03/30/2015   OA (osteoarthritis) of knee 05/10/2014   Pacemaker-St.Jude 02/01/2012   Permanent atrial fibrillation ([place])    Hypertension    Heart murmur    Chronic anticoagulation      Janene Harvey, PT, DPT 02/06/20 8:53 AM   [facility] [address]  [place], [place], [address] Phone: [phone]   Fax:  [phone]  Name: DEAIRE MCWHIRTER MRN: [id number] Date of Birth: [birth year]

## 2020-02-11 ENCOUNTER — Other Ambulatory Visit: Payer: Self-pay

## 2020-02-11 ENCOUNTER — Ambulatory Visit: Payer: PPO

## 2020-02-11 DIAGNOSIS — M6281 Muscle weakness (generalized): Secondary | ICD-10-CM

## 2020-02-11 DIAGNOSIS — M25561 Pain in right knee: Secondary | ICD-10-CM

## 2020-02-11 DIAGNOSIS — M25661 Stiffness of right knee, not elsewhere classified: Secondary | ICD-10-CM

## 2020-02-11 DIAGNOSIS — R262 Difficulty in walking, not elsewhere classified: Secondary | ICD-10-CM

## 2020-02-11 NOTE — Therapy (Signed)
[facility] [address]  [place], [place], [address] Phone: [phone]   Fax:  [phone]  Physical Therapy Treatment  Patient Details  Name: Brian Davidson MRN: [id number] Date of Birth: [birth year] Referring Provider (PT): Brian Davidson, [place]   Encounter Date: 02/11/2020  PT End of Session - 02/11/20 6144    Visit Number  16    Number of Visits  18    Date for PT Re-Evaluation  02/14/20    Authorization Type  [employer]    PT Start Time  0930    PT Stop Time  1015    PT Time Calculation (min)  45 min    Activity Tolerance  Patient tolerated treatment well;Patient limited by pain    Behavior During Therapy  [facility] for tasks assessed/performed       Past Medical History:  Diagnosis Date  . Arthritis   . Biliary dyskinesia   . Carotid artery disease ([place]) 07/01/2016  . Dyslipidemia 07/13/2017  . Dysrhythmia    a fib  . GERD (gastroesophageal reflux disease)    occasional  . H/O measles   . History of chicken pox   . History of kidney stones   . Hypertension   . Neuromuscular disorder (HCC)    neuropathy feet   . Persistent atrial fibrillation ([facility])   . Pneumonia 1992  . Presence of permanent cardiac pacemaker   . Tachycardia-bradycardia ([place])    a. s/p STJ dual chamber pacemaker  . Thiamine deficiency 01/19/2018  . Valvular heart disease 07/01/2016    Past Surgical History:  Procedure Laterality Date  . CHOLECYSTECTOMY N/A 11/24/2015   Procedure: LAPAROSCOPIC CHOLECYSTECTOMY;  Surgeon: Brian Pickerel, MD;  Location: [facility];  Service: General;  Laterality: N/A;  . CYSTOSCOPY  2016  . EYE SURGERY     bil cataract  . FRACTURE SURGERY  2005   left ankle  . HERNIA REPAIR     left groin, no mesh  . PERMANENT PACEMAKER INSERTION N/A 01/31/2012   SJM Accent SR RF implanted by DR Brian Davidson  . TOTAL KNEE ARTHROPLASTY Right 12/23/2019   Procedure: TOTAL KNEE ARTHROPLASTY;  Surgeon: Brian Arabian, MD;   Location: [facility];  Service: Orthopedics;  Laterality: Right;  98mn    There were no vitals filed for this visit.  Subjective Assessment - 02/11/20 0937    Subjective  Doing ok today with no new complaints.    Pertinent History  pacemaker, tachycardia/bradycardia, a-fib, kidney stone, HTN, GERD, HLD, CAD, hernia repair, L ankle fx surgery    Diagnostic tests  none recent    Patient Stated Goals  work on ROM    Currently in Pain?  No/denies    Pain Score  0-No pain    Pain Location  Knee    Pain Orientation  Right    Pain Descriptors / Indicators  Tightness    Pain Type  Acute pain;Surgical pain    Multiple Pain Sites  No         [facility] PT Assessment - 02/11/20 0001      Assessment   Medical Diagnosis  Osteoarthritis of R knee joint, s/p R TKA    Referring Provider (PT)  KTheresa Duty Davidson    Onset Date/Surgical Date  12/23/19    Next MD Visit  03/03/20                    [facility]  Self-Care   Self-Care  Other Self-Care Comments    Other Self-Care Comments   Reviewed Brian Davidson's home exercise program to check for need for update; reviewed proper use of machine LE strengthening for performance at local gym (likely [employer])       Neuro Re-ed    Neuro Re-ed Details   Alternating cone knock over righting x 7 cones - therapist CGA - difficulty controlling movement       Knee/Hip Exercises: Stretches   Passive Hamstring Stretch  Right;30 seconds;2 reps    Passive Hamstring Stretch Limitations  supine with strap      Knee/Hip Exercises: Aerobic   Stationary Bike  L1 x 65mn (full revolutions)      Knee/Hip Exercises: Standing   Heel Raises  Both;15 reps    Heel Raises Limitations  + quad set, glute set     Knee Flexion  Right;Left;10 reps    Knee Flexion Limitations  2# - counter       Knee/Hip Exercises: Seated   Long Arc Quad  Right;Left;10 reps    Long Arc Quad Weight  2 lbs.    Long Arc Quad Limitations  3" TKE                PT Short Term Goals - 01/20/20 1334      PT SHORT TERM GOAL #1   Title  Patient to be independent with initial HEP.    Time  2    Period  Weeks    Status  Achieved    Target Date  01/10/20        PT Long Term Goals - 02/11/20 1001      PT LONG TERM GOAL #1   Title  Patient to be independent with advanced HEP.    Time  3    Period  Weeks    Status  Partially Met   met for current     PT LONG TERM GOAL #2   Title  Patient to demonstrate R knee AROM/PROM 0-120 degrees.    Time  3    Period  Weeks    Status  Partially Met   knee AROM 6-115 degrees, and PROM 5-119 degrees     PT LONG TERM GOAL #3   Title  Patient to demonstrate B LE strength >/=4+/5.    Time  3    Period  Weeks    Status  Partially Met   still limited in B hip flexion     PT LONG TERM GOAL #4   Title  Patient to report tolerance of walking 1 mile without pain limiting.    Time  3    Period  Weeks    Status  On-going   02/11/20:  feels he can walk a mile however has not yet attempted     PT LONG TERM GOAL #5   Title  Patient to demonstrate symmetrical step length, knee flexion, and weight shift with LRAD.    Time  3    Period  Weeks    Status  Partially Met   lacking R TKE and demonstrating anterior trunk lean throughout           Plan - 02/11/20 0939    Clinical Impression Statement  KYvone Neudoing well today.  Tolerated all ROM activities well today.  Reviewed HEP today with plans to transition to home program after next visit.  Pt. with plans to transition to gym strengthening after PT thus reviewed machine strengthening.  Ended visit with pt. verbalizing understanding of comprehensive HEP and will plan to for final goal testing and transition to HEP in coming session.    Comorbidities  pacemaker, tachycardia/bradycardia, a-fib, kidney stone, HTN, GERD, HLD, CAD, hernia repair, L ankle fx surgery    Rehab Potential  Good    PT Frequency  2x / week    PT  Treatment/Interventions  ADLs/Self Care Home Management;Cryotherapy;Moist Heat;Balance training;Therapeutic exercise;Therapeutic activities;Functional mobility training;Stair training;Gait training;Neuromuscular re-education;Patient/family education;Manual techniques;Vasopneumatic Device;Taping;Energy conservation;Dry needling;Passive range of motion;Scar mobilization    PT Next Visit Plan  Progress R knee ROM and LE strengthening    Consulted and Agree with Plan of Care  Patient       Patient will benefit from skilled therapeutic intervention in order to improve the following deficits and impairments:  Decreased endurance, Hypomobility, Increased edema, Decreased scar mobility, Decreased activity tolerance, Decreased strength, Pain, Increased fascial restricitons, Decreased balance, Difficulty walking, Increased muscle spasms, Improper body mechanics, Decreased range of motion, Impaired flexibility, Postural dysfunction  Visit Diagnosis: Acute pain of right knee  Stiffness of right knee, not elsewhere classified  Muscle weakness (generalized)  Difficulty in walking, not elsewhere classified     Problem List Patient Active Problem List   Diagnosis Date Noted  . Primary osteoarthritis of right knee 12/23/2019  . Symptomatic bradycardia 05/31/2019  . Vertigo 07/27/2018  . Multiple closed fractures of ribs of right side 01/21/2018  . Thiamine deficiency 01/19/2018  . Peripheral neuropathy 07/13/2017  . Dyslipidemia 07/13/2017  . Preventative health care 01/11/2017  . Carotid artery disease ([place]) 07/01/2016  . Mitral valve disorder 07/01/2016  . Low back pain 12/31/2015  . Osteopenia 12/31/2015  . Allergic state 12/31/2015  . Skin lesion 12/31/2015  . History of chicken pox   . Calculi, ureter 03/30/2015  . OA (osteoarthritis) of knee 05/10/2014  . Pacemaker-St.Jude 02/01/2012  . Permanent atrial fibrillation ([place])   . Hypertension   . Heart murmur   . Chronic anticoagulation      Bess Harvest, PTA 02/11/20 12:13 PM    [facility] [address]  [place], [place], [address] Phone: [phone]   Fax:  [phone]  Name: Brian Davidson MRN: [id number] Date of Birth: 02-02-1936

## 2020-02-14 ENCOUNTER — Other Ambulatory Visit: Payer: Self-pay

## 2020-02-14 ENCOUNTER — Ambulatory Visit: Payer: PPO

## 2020-02-14 DIAGNOSIS — M25561 Pain in right knee: Secondary | ICD-10-CM

## 2020-02-14 DIAGNOSIS — R262 Difficulty in walking, not elsewhere classified: Secondary | ICD-10-CM

## 2020-02-14 DIAGNOSIS — M25661 Stiffness of right knee, not elsewhere classified: Secondary | ICD-10-CM

## 2020-02-14 DIAGNOSIS — M6281 Muscle weakness (generalized): Secondary | ICD-10-CM

## 2020-02-14 NOTE — Therapy (Addendum)
[facility] [address]  [place], [place], [address] Phone: [phone]   Fax:  [phone]  Physical Therapy Treatment  Patient Details  Name: Brian Davidson MRN: [id number] Date of Birth: 11-03-1935 Referring Provider (PT): Theresa Duty, [place]   Progress Note Reporting Period 01/22/20 to 02/14/20  See note below for Objective Data and Assessment of Progress/Goals.    Encounter Date: 02/14/2020   PT End of Session - 02/14/20 0854    Visit Number 17    Number of Visits 18    Date for PT Re-Evaluation 02/14/20    Authorization Type [employer]    PT Start Time 0850    PT Stop Time 0930    PT Time Calculation (min) 40 min    Activity Tolerance Patient tolerated treatment well;Patient limited by pain    Behavior During Therapy [facility] for tasks assessed/performed           Past Medical History:  Diagnosis Date  . Arthritis   . Biliary dyskinesia   . Carotid artery disease ([place]) 07/01/2016  . Dyslipidemia 07/13/2017  . Dysrhythmia    a fib  . GERD (gastroesophageal reflux disease)    occasional  . H/O measles   . History of chicken pox   . History of kidney stones   . Hypertension   . Neuromuscular disorder (HCC)    neuropathy feet   . Persistent atrial fibrillation ([place])   . Pneumonia 1992  . Presence of permanent cardiac pacemaker   . Tachycardia-bradycardia ([place])    a. s/p STJ dual chamber pacemaker  . Thiamine deficiency 01/19/2018  . Valvular heart disease 07/01/2016    Past Surgical History:  Procedure Laterality Date  . CHOLECYSTECTOMY N/A 11/24/2015   Procedure: LAPAROSCOPIC CHOLECYSTECTOMY;  Surgeon: Greer Pickerel, MD;  Location: [facility];  Service: General;  Laterality: N/A;  . CYSTOSCOPY  2016  . EYE SURGERY     bil cataract  . FRACTURE SURGERY  2005   left ankle  . HERNIA REPAIR     left groin, no mesh  . PERMANENT PACEMAKER INSERTION N/A 01/31/2012   SJM Accent SR RF implanted by  DR Allred  . TOTAL KNEE ARTHROPLASTY Right 12/23/2019   Procedure: TOTAL KNEE ARTHROPLASTY;  Surgeon: Gaynelle Arabian, MD;  Location: [facility];  Service: Orthopedics;  Laterality: Right;  78mn    There were no vitals filed for this visit.   Subjective Assessment - 02/14/20 0853    Subjective Pt reports he is feeling good today, "knee feels strong". He wants to hold his chart for 30 days - seeing his doctor soon and will make decision. He says, "it's been a long 7 weeks".    Pertinent History pacemaker, tachycardia/bradycardia, a-fib, kidney stone, HTN, GERD, HLD, CAD, hernia repair, L ankle fx surgery    Diagnostic tests none recent    Patient Stated Goals work on ROM    Currently in Pain? No/denies                             [facility] - [facility]      Ambulation/Gait   Stairs Yes    Stairs Assistance 6: Modified independent (Device/Increase time)    Stair Management Technique One rail Right    Number of Stairs 26    Height of Stairs 7    Gait Comments decreased time on RLE with quick descent downstairs  due to decr R knee ROM      Knee/Hip Exercises: Stretches   Passive Hamstring Stretch Right;3 reps;30 seconds    Passive Hamstring Stretch Limitations supine with strap    Quad Stretch Right;Left;1 rep;30 seconds    Quad Stretch Limitations S/L with strap    Other Knee/Hip Stretches Seated knee ext mobs 3x30" followed by 30" HS stretch reaching for toes      Knee/Hip Exercises: Aerobic   Stationary Bike L4 x 72mn (full revolutions)   L4 2 min, L 2 4 min     Knee/Hip Exercises: Standing   Knee Flexion AROM;Strengthening;Right;Left;1 set;15 reps    Knee Flexion Limitations TM rail    Hip Abduction AROM;Stengthening;Right;Left;1 set;15 reps;Knee straight    Abduction Limitations decreased knee extension R, manual/verbal cues at TM rail    Hip Extension AROM;Stengthening;Right;Left;1 set;15 reps;Knee straight    Extension Limitations TM  rail    Functional Squat Limitations Mini Squat TM rail 15x3" holds    Stairs descended 2 flights with R HR and decreased eccentric control leading with LLE      Knee/Hip Exercises: Seated   Long Arc Quad Right;Left;10 reps    Long Arc Quad Weight 2 lbs.    Long Arc Quad Limitations 3" TKE                    PT Short Term Goals - 01/20/20 1334      PT SHORT TERM GOAL #1   Title Patient to be independent with initial HEP.    Time 2    Period Weeks    Status Achieved    Target Date 01/10/20             PT Long Term Goals - 02/11/20 1001      PT LONG TERM GOAL #1   Title Patient to be independent with advanced HEP.    Time 3    Period Weeks    Status Partially Met   met for current     PT LONG TERM GOAL #2   Title Patient to demonstrate R knee AROM/PROM 0-120 degrees.    Time 3    Period Weeks    Status Partially Met   knee AROM 6-115 degrees, and PROM 5-119 degrees     PT LONG TERM GOAL #3   Title Patient to demonstrate B LE strength >/=4+/5.    Time 3    Period Weeks    Status Partially Met   still limited in B hip flexion     PT LONG TERM GOAL #4   Title Patient to report tolerance of walking 1 mile without pain limiting.    Time 3    Period Weeks    Status On-going   02/11/20:  feels he can walk a mile however has not yet attempted     PT LONG TERM GOAL #5   Title Patient to demonstrate symmetrical step length, knee flexion, and weight shift with LRAD.    Time 3    Period Weeks    Status Partially Met   lacking R TKE and demonstrating anterior trunk lean throughout                Plan - 02/14/20 0855    Clinical Impression Statement Brian Davidson with continued lack of knee extension ROM at -10 and knee flexion to 110. He would like to hold his chart for 30 days and perform HEP at home until he sees his MD  on 6/29 and further assesses his knee. Reviewed HEP and functional strength, educating pt to continue to focus on stretching, knee  extension and quad strengthening.    Comorbidities pacemaker, tachycardia/bradycardia, a-fib, kidney stone, HTN, GERD, HLD, CAD, hernia repair, L ankle fx surgery    Rehab Potential Good    PT Frequency 2x / week    PT Treatment/Interventions ADLs/Self Care Home Management;Cryotherapy;Moist Heat;Balance training;Therapeutic exercise;Therapeutic activities;Functional mobility training;Stair training;Gait training;Neuromuscular re-education;Patient/family education;Manual techniques;Vasopneumatic Device;Taping;Energy conservation;Dry needling;Passive range of motion;Scar mobilization    PT Next Visit Plan 30 day hold    Consulted and Agree with Plan of Care Patient           Patient will benefit from skilled therapeutic intervention in order to improve the following deficits and impairments:  Decreased endurance, Hypomobility, Increased edema, Decreased scar mobility, Decreased activity tolerance, Decreased strength, Pain, Increased fascial restricitons, Decreased balance, Difficulty walking, Increased muscle spasms, Improper body mechanics, Decreased range of motion, Impaired flexibility, Postural dysfunction  Visit Diagnosis: Acute pain of right knee  Stiffness of right knee, not elsewhere classified  Muscle weakness (generalized)  Difficulty in walking, not elsewhere classified     Problem List Patient Active Problem List   Diagnosis Date Noted  . Primary osteoarthritis of right knee 12/23/2019  . Symptomatic bradycardia 05/31/2019  . Vertigo 07/27/2018  . Multiple closed fractures of ribs of right side 01/21/2018  . Thiamine deficiency 01/19/2018  . Peripheral neuropathy 07/13/2017  . Dyslipidemia 07/13/2017  . Preventative health care 01/11/2017  . Carotid artery disease ([place]) 07/01/2016  . Mitral valve disorder 07/01/2016  . Low back pain 12/31/2015  . Osteopenia 12/31/2015  . Allergic state 12/31/2015  . Skin lesion 12/31/2015  . History of chicken pox   . Calculi,  ureter 03/30/2015  . OA (osteoarthritis) of knee 05/10/2014  . Pacemaker-[employer] 02/01/2012  . Permanent atrial fibrillation ([place])   . Hypertension   . Heart murmur   . Chronic anticoagulation     Izell Motley, PT, DPT 02/14/2020, 11:31 AM  [facility] [address]  [place], [place], [address] Phone: [phone]   Fax:  [phone]  Name: Brian Davidson MRN: [id number] Date of Birth: [birth year]   PHYSICAL THERAPY DISCHARGE SUMMARY  Visits from Start of Care: 17  Current functional level related to goals / functional outcomes: See above clinical impression; patient did not return since 30 day hold   Remaining deficits: Decreased knee ROM, decreased hip flexion strength, gait deviations   Education / Equipment: HEP  Plan: Patient agrees to discharge.  Patient goals were partially met. Patient is being discharged due to being pleased with the current functional level.  ?????     Janene Harvey, PT, DPT 04/20/20 11:15 AM

## 2020-02-27 DIAGNOSIS — L84 Corns and callosities: Secondary | ICD-10-CM | POA: Diagnosis not present

## 2020-03-03 ENCOUNTER — Ambulatory Visit (INDEPENDENT_AMBULATORY_CARE_PROVIDER_SITE_OTHER): Payer: PPO | Admitting: *Deleted

## 2020-03-03 DIAGNOSIS — I4821 Permanent atrial fibrillation: Secondary | ICD-10-CM

## 2020-03-03 LAB — CUP PACEART REMOTE DEVICE CHECK
Battery Remaining Longevity: 125 mo
Battery Remaining Percentage: 91 %
Battery Voltage: 2.95 V
Brady Statistic RV Percent Paced: 9.2 %
Date Time Interrogation Session: 20210629032535
Implantable Lead Implant Date: 20130528
Implantable Lead Location: 753860
Implantable Lead Model: 1948
Implantable Pulse Generator Implant Date: 20130528
Lead Channel Impedance Value: 640 Ohm
Lead Channel Pacing Threshold Amplitude: 0.75 V
Lead Channel Pacing Threshold Pulse Width: 0.4 ms
Lead Channel Sensing Intrinsic Amplitude: 12 mV
Lead Channel Setting Pacing Amplitude: 2.5 V
Lead Channel Setting Pacing Pulse Width: 0.4 ms
Lead Channel Setting Sensing Sensitivity: 2 mV
Pulse Gen Model: 1210
Pulse Gen Serial Number: 7328888

## 2020-03-05 ENCOUNTER — Telehealth: Payer: Self-pay

## 2020-03-05 NOTE — Telephone Encounter (Signed)
Merlin alert received 03/05/20 for episode of ventricular tachycardia event detected on 03/04/20 at 6:35 PM. Called patient to assess, patient reports he had knee replacement  About 3 weeks ago and he has reports of walking during this episode. Patient denies any complaints during this time and states he has been doing well. Advised patient to call DC back if he has any further questions or concerns, verbalizes understanding.

## 2020-03-05 NOTE — Progress Notes (Signed)
Remote pacemaker transmission.   

## 2020-03-06 ENCOUNTER — Telehealth: Payer: Self-pay | Admitting: Family Medicine

## 2020-03-06 NOTE — Progress Notes (Signed)
  Chronic Care Management   Note  03/06/2020 Name: Brian Davidson MRN: [id number] DOB: 1935/10/10  Brian Davidson is a 84 y.o. year old male who is a primary care patient of Mosie Lukes, MD. I reached out to [name] by phone today in response to a referral sent by Brian Davidson's PCP, Mosie Lukes, MD.   Mr. Churchwell was given information about Chronic Care Management services today including:  1. CCM service includes personalized support from designated clinical staff supervised by his physician, including individualized plan of care and coordination with other care providers 2. 24/7 contact phone numbers for assistance for urgent and routine care needs. 3. Service will only be billed when office clinical staff spend 20 minutes or more in a month to coordinate care. 4. Only one practitioner may furnish and bill the service in a calendar month. 5. The patient may stop CCM services at any time (effective at the end of the month) by phone call to the office staff.   Patient agreed to services and verbal consent obtained.   Follow up plan:   [place]

## 2020-03-09 ENCOUNTER — Other Ambulatory Visit: Payer: Self-pay | Admitting: Family Medicine

## 2020-03-09 ENCOUNTER — Other Ambulatory Visit: Payer: Self-pay | Admitting: Medical

## 2020-03-11 DIAGNOSIS — L570 Actinic keratosis: Secondary | ICD-10-CM | POA: Diagnosis not present

## 2020-03-11 DIAGNOSIS — L821 Other seborrheic keratosis: Secondary | ICD-10-CM | POA: Diagnosis not present

## 2020-03-18 DIAGNOSIS — H35432 Paving stone degeneration of retina, left eye: Secondary | ICD-10-CM | POA: Diagnosis not present

## 2020-03-18 DIAGNOSIS — H43813 Vitreous degeneration, bilateral: Secondary | ICD-10-CM | POA: Diagnosis not present

## 2020-03-18 DIAGNOSIS — H33312 Horseshoe tear of retina without detachment, left eye: Secondary | ICD-10-CM | POA: Diagnosis not present

## 2020-03-18 DIAGNOSIS — H35371 Puckering of macula, right eye: Secondary | ICD-10-CM | POA: Diagnosis not present

## 2020-03-24 DIAGNOSIS — H33312 Horseshoe tear of retina without detachment, left eye: Secondary | ICD-10-CM | POA: Diagnosis not present

## 2020-03-31 DIAGNOSIS — H43391 Other vitreous opacities, right eye: Secondary | ICD-10-CM | POA: Diagnosis not present

## 2020-03-31 DIAGNOSIS — H33312 Horseshoe tear of retina without detachment, left eye: Secondary | ICD-10-CM | POA: Diagnosis not present

## 2020-04-07 ENCOUNTER — Ambulatory Visit (INDEPENDENT_AMBULATORY_CARE_PROVIDER_SITE_OTHER): Payer: PPO | Admitting: Medical

## 2020-04-07 ENCOUNTER — Encounter: Payer: Self-pay | Admitting: Medical

## 2020-04-07 ENCOUNTER — Other Ambulatory Visit: Payer: Self-pay

## 2020-04-07 VITALS — BP 120/60 | HR 61 | Resp 18 | Ht 72.0 in | Wt 221.0 lb

## 2020-04-07 DIAGNOSIS — R109 Unspecified abdominal pain: Secondary | ICD-10-CM | POA: Diagnosis not present

## 2020-04-07 NOTE — Patient Instructions (Addendum)
You have recent left lower abdomen/pelvic area pain.  Initially thought the area discomfort was a left groin area but no obvious findings on physical exam in the inguinal region.  But slight small bulge medial to the left anterior superior iliac spine area.  Difficult to give exact diagnosis.  You might have strained lower abdomen muscle, small lipoma or possible early hernia that reduces when legs were lowered.  We will get a CBC to make sure your infection fighting cells were not elevated.  I will go ahead and refer to general surgeon get opinion on this area.  If your pain features change or worsen let me know.  Clinically let me know if you have left lower quadrant pain with any fevers or chills.  In that event would give Cipro and Flagyl.  However presentation does not match diverticulitis or colitis type presentation presently.  -Update me before Friday if symptoms change or worsen in light of your upcoming trip to the beach.  Follow-up in note 2 weeks or as needed.

## 2020-04-07 NOTE — Progress Notes (Signed)
Subjective:    Patient ID: Brian Davidson, male    DOB: 02/10/1936, 84 y.o.   MRN: [id number]  HPI   Pt in for evaluation.  10 days ago he got up from sitting and felt burning sensation to left groin area. Pt Certain positions will cause burning sensation. No rash on pt skin. No itching in groin area.   Pt states left side hernia repair 15 years ago or so.  Pt states no obvious bulge. No fever, no chills or sweats.  No diarrhea or constipation.    Review of Systems  Constitutional: Negative for chills, fatigue and fever.  Respiratory: Negative for cough and choking.   Cardiovascular: Negative for chest pain and palpitations.  Gastrointestinal: Positive for abdominal pain. Negative for blood in stool, diarrhea and vomiting.  Musculoskeletal: Negative for back pain, joint swelling, myalgias and neck stiffness.  Hematological: Negative for adenopathy. Does not bruise/bleed easily.  Psychiatric/Behavioral: Negative for behavioral problems and decreased concentration. The patient is not nervous/anxious.    Past Medical History:  Diagnosis Date  . Arthritis   . Biliary dyskinesia   . Carotid artery disease ([place]) 07/01/2016  . Dyslipidemia 07/13/2017  . Dysrhythmia    a fib  . GERD (gastroesophageal reflux disease)    occasional  . H/O measles   . History of chicken pox   . History of kidney stones   . Hypertension   . Neuromuscular disorder (HCC)    neuropathy feet   . Persistent atrial fibrillation ([place])   . Pneumonia 1992  . Presence of permanent cardiac pacemaker   . Tachycardia-bradycardia ([place])    a. s/p STJ dual chamber pacemaker  . Thiamine deficiency 01/19/2018  . Valvular heart disease 07/01/2016     Social History   Socioeconomic History  . Marital status: Married    Spouse name: [name]  . Number of children: 1  . Years of education: Not on file  . Highest education level: Not on file  Occupational History  . Occupation: Retired  . 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 Smoking status: Former Smoker    Years: 10.00    Types: Pipe, Cigars    Quit date: 11/18/1985    Years since quitting: 34.4  . Smokeless tobacco: Never Used  Vaping Use  . Vaping Use: Never used  Substance and Sexual Activity  . Alcohol use: Yes    Alcohol/week: 3.0 standard drinks    Types: 3 Cans of beer per week    Comment: occ  . Drug use: No  . Sexual activity: Not Currently  Other Topics Concern  . Not on file  Social History Narrative   Lives with wife, still works part-time as a Geophysicist/field seismologist. He is active around the house and yard...   Parents are deceased and did not have cardiac issues but he has 2 siblings with atrial fibrillation and a sister-in-law  has a pacemaker.   Social Determinants of Health   Financial Resource Strain: Low Risk   . Difficulty of Paying Living Expenses: Not hard at all  Food Insecurity: No Food Insecurity  . Worried About Charity fundraiser in the Last Year: Never true  . Ran Out of Food in the Last Year: Never true  Transportation Needs: No Transportation Needs  . Lack of Transportation (Medical): No  . Lack of Transportation (Non-Medical): No  Physical Activity:   . Days of Exercise per Week:   . Minutes of Exercise per  Session:   Stress:   . Feeling of Stress :   Social Connections:   . Frequency of Communication with Friends and Family:   . Frequency of Social Gatherings with Friends and Family:   . Attends Religious Services:   . Active Member of Clubs or Organizations:   . Attends Archivist Meetings:   Marland Kitchen Marital Status:   Intimate Partner Violence:   . Fear of Current or Ex-Partner:   . Emotionally Abused:   Marland Kitchen Physically Abused:   . Sexually Abused:     Past Surgical History:  Procedure Laterality Date  . CHOLECYSTECTOMY N/A 11/24/2015   Procedure: LAPAROSCOPIC CHOLECYSTECTOMY;  Surgeon: Greer Pickerel, MD;  Location: [facility];  Service: General;  Laterality: N/A;  .  CYSTOSCOPY  2016  . EYE SURGERY     bil cataract  . FRACTURE SURGERY  2005   left ankle  . HERNIA REPAIR     left groin, no mesh  . PERMANENT PACEMAKER INSERTION N/A 01/31/2012   SJM Accent SR RF implanted by DR Allred  . TOTAL KNEE ARTHROPLASTY Right 12/23/2019   Procedure: TOTAL KNEE ARTHROPLASTY;  Surgeon: Gaynelle Arabian, MD;  Location: [facility];  Service: Orthopedics;  Laterality: Right;  13min    Family History  Problem Relation Age of Onset  . Hypertension Mother   . Arthritis Mother   . Drug abuse Mother   . Stroke Father   . Heart disease Father   . Heart disease Sister 45  . Heart disease Brother   . Hypertension Son   . Heart disease Sister   . Neuropathy Brother   . Hypertension Brother   . Atrial fibrillation Brother   . Atrial fibrillation Sister   . Colon cancer Neg Hx     Allergies  Allergen Reactions  . Cardizem [Diltiazem]     Rash from Yellow dye in generic capsule. Can take different brand. Takes diltiazem - his allergy is to Cardizem  . Diltiazem Hcl     Rash from red dye in generic capsule. Can take different brand. Takes diltiazem - his allergy is to Cardizem Rash from red dye in generic capsule. Can take different brand. Takes diltiazem - his allergy is to Cardizem  . Sulfa Antibiotics     Unknown childhood reaction    Current Outpatient Medications on File Prior to Visit  Medication Sig Dispense Refill  . acetaminophen (TYLENOL) 500 MG tablet Take 1,000 mg by mouth every 6 (six) hours as needed for headache. (Patient not taking: Reported on 04/07/2020)    . Biotin 5000 MCG TABS Take 5,000 mg by mouth daily.    . Calcium Carbonate-Vitamin D (CALTRATE 600+D PO) Take 600 mg by mouth daily.     . Cholecalciferol 50 MCG (2000 UT) TABS Take 2,000 Units by mouth daily.     . Cyanocobalamin (VITAMIN B 12 PO) Take 5,000 mcg by mouth daily.     . diclofenac Sodium (VOLTAREN) 1 % GEL Apply topically daily as needed (Pain). (Patient not taking: Reported  on 04/07/2020)    . diltiazem (CARDIZEM CD) 240 MG 24 hr capsule TAKE ONE (1) CAPSULE EACH DAY BY MOUTH 90 capsule 3  . gabapentin (NEURONTIN) 300 MG capsule Take 1 capsule (300 mg total) by mouth 2 (two) times daily. 180 capsule 3  . irbesartan-hydrochlorothiazide (AVALIDE) 150-12.5 MG tablet Take 1/2 (one-half) tablet by mouth once daily 45 tablet 1  . loratadine (CLARITIN) 10 MG tablet Take 10 mg by mouth daily as  needed for allergies.  (Patient not taking: Reported on 04/07/2020)    . magnesium oxide (MAG-OX) 400 MG tablet Take 400 mg by mouth daily.    . methocarbamol (ROBAXIN) 500 MG tablet Take 1 tablet (500 mg total) by mouth every 6 (six) hours as needed for muscle spasms. (Patient not taking: Reported on 04/07/2020) 40 tablet 0  . metoprolol tartrate (LOPRESSOR) 100 MG tablet Take 1 tablet (100 mg total) by mouth 2 (two) times daily. 180 tablet 0  . oxyCODONE (OXY IR/ROXICODONE) 5 MG immediate release tablet Take 1-2 tablets (5-10 mg total) by mouth every 6 (six) hours as needed for severe pain. Not to exceed 6 tablets a day. (Patient not taking: Reported on 04/07/2020) 56 tablet 0  . rivaroxaban (XARELTO) 20 MG TABS tablet Take 1 tablet (20 mg total) by mouth daily. 90 tablet 0  . thiamine 100 MG tablet Take 100 mg by mouth daily. (Patient not taking: Reported on 04/07/2020)    . traMADol (ULTRAM) 50 MG tablet Take 1-2 tablets (50-100 mg total) by mouth every 6 (six) hours as needed for moderate pain. (Patient not taking: Reported on 04/07/2020) 40 tablet 0  . Turmeric 500 MG TABS Take 500 mg by mouth daily.      No current facility-administered medications on file prior to visit.    BP 120/60   Pulse 61   Resp 18   Ht 6' (1.829 m)   Wt 221 lb (100.2 kg)   SpO2 96%   BMI 29.97 kg/m       Objective:   Physical Exam  General- No acute distress. Pleasant patient. Neck- Full range of motion, no jvd Lungs- Clear, even and unlabored. Heart- regular rate and rhythm. Neurologic- CNII- XII  grossly intact.  Abdomen- soft, nd, +bs, nondistended. +bs, no rebound or guarding. Does have small palpable bulge about 2- 2.5 cm medial to anterior superior iliac spine area.. Felt only on straight leg lift.   Genital exam- no obvious buldge or palpable hernia. No defect. Testicle non-tender.  Back- no cva tenderness.        Assessment & Plan:  You have recent left lower abdomen/pelvic area pain.  Initially thought the area discomfort was a left groin area but no obvious findings on physical exam in the inguinal region.  But slight small bulge medial to the left anterior superior iliac spine area.  Difficult to give exact diagnosis.  You might have strained lower abdomen muscle, small lipoma or possible early hernia that reduces when legs were lowered.  We will get a CBC to make sure your infection fighting cells were not elevated.  I will go ahead and refer to general surgeon get opinion on this area.  If your pain features change or worsen let me know.  Clinically let me know if you have left lower quadrant pain with any fevers or chills.  In that event would give Cipro and Flagyl.  However presentation does not match diverticulitis or colitis type presentation presently.  -Update me before Friday if symptoms change or worsen in light of your upcoming trip to the beach.  Follow-up in note 2 weeks or as needed.  Time spent with patient today was 30   minutes which consisted of chart review, discussing new diagnosis with uncertain diagnosis, work up, treatment, answering question and documentation.

## 2020-04-08 LAB — CBC WITH DIFFERENTIAL/PLATELET
Basophils Absolute: 0.1 10*3/uL (ref 0.0–0.1)
Basophils Relative: 0.8 % (ref 0.0–3.0)
Eosinophils Absolute: 0.2 10*3/uL (ref 0.0–0.7)
Eosinophils Relative: 2.8 % (ref 0.0–5.0)
HCT: 41.3 % (ref 39.0–52.0)
Hemoglobin: 13.5 g/dL (ref 13.0–17.0)
Lymphocytes Relative: 15.8 % (ref 12.0–46.0)
Lymphs Abs: 1.1 10*3/uL (ref 0.7–4.0)
MCHC: 32.6 g/dL (ref 30.0–36.0)
MCV: 90.2 fl (ref 78.0–100.0)
Monocytes Absolute: 1 10*3/uL (ref 0.1–1.0)
Monocytes Relative: 15.1 % — ABNORMAL HIGH (ref 3.0–12.0)
Neutro Abs: 4.5 10*3/uL (ref 1.4–7.7)
Neutrophils Relative %: 65.5 % (ref 43.0–77.0)
Platelets: 156 10*3/uL (ref 150.0–400.0)
RBC: 4.58 Mil/uL (ref 4.22–5.81)
RDW: 15.1 % (ref 11.5–15.5)
WBC: 6.9 10*3/uL (ref 4.0–10.5)

## 2020-04-28 ENCOUNTER — Telehealth: Payer: Self-pay | Admitting: Pharmacist

## 2020-04-28 NOTE — Progress Notes (Signed)
Chronic Care Management Pharmacy Assistant   Name: Brian Davidson  MRN: [id number] DOB: 12/04/1935  Reason for Encounter: Initial Questions  PCP : Mosie Lukes, MD  Allergies:   Allergies  Allergen Reactions  . Cardizem [Diltiazem]     Rash from Yellow dye in generic capsule. Can take different brand. Takes diltiazem - his allergy is to Cardizem  . Diltiazem Hcl     Rash from red dye in generic capsule. Can take different brand. Takes diltiazem - his allergy is to Cardizem Rash from red dye in generic capsule. Can take different brand. Takes diltiazem - his allergy is to Cardizem  . Sulfa Antibiotics     Unknown childhood reaction    Medications: Outpatient Encounter Medications as of 04/28/2020  Medication Sig  . acetaminophen (TYLENOL) 500 MG tablet Take 1,000 mg by mouth every 6 (six) hours as needed for headache. (Patient not taking: Reported on 04/07/2020)  . Biotin 5000 MCG TABS Take 5,000 mg by mouth daily.  . Calcium Carbonate-Vitamin D (CALTRATE 600+D PO) Take 600 mg by mouth daily.   . Cholecalciferol 50 MCG (2000 UT) TABS Take 2,000 Units by mouth daily.   . Cyanocobalamin (VITAMIN B 12 PO) Take 5,000 mcg by mouth daily.   . diclofenac Sodium (VOLTAREN) 1 % GEL Apply topically daily as needed (Pain). (Patient not taking: Reported on 04/07/2020)  . diltiazem (CARDIZEM CD) 240 MG 24 hr capsule TAKE ONE (1) CAPSULE EACH DAY BY MOUTH  . gabapentin (NEURONTIN) 300 MG capsule Take 1 capsule (300 mg total) by mouth 2 (two) times daily.  . irbesartan-hydrochlorothiazide (AVALIDE) 150-12.5 MG tablet Take 1/2 (one-half) tablet by mouth once daily  . loratadine (CLARITIN) 10 MG tablet Take 10 mg by mouth daily as needed for allergies.  (Patient not taking: Reported on 04/07/2020)  . magnesium oxide (MAG-OX) 400 MG tablet Take 400 mg by mouth daily.  . methocarbamol (ROBAXIN) 500 MG tablet Take 1 tablet (500 mg total) by mouth every 6 (six) hours as needed for muscle  spasms. (Patient not taking: Reported on 04/07/2020)  . metoprolol tartrate (LOPRESSOR) 100 MG tablet Take 1 tablet (100 mg total) by mouth 2 (two) times daily.  Marland Kitchen oxyCODONE (OXY IR/ROXICODONE) 5 MG immediate release tablet Take 1-2 tablets (5-10 mg total) by mouth every 6 (six) hours as needed for severe pain. Not to exceed 6 tablets a day. (Patient not taking: Reported on 04/07/2020)  . rivaroxaban (XARELTO) 20 MG TABS tablet Take 1 tablet (20 mg total) by mouth daily.  Marland Kitchen thiamine 100 MG tablet Take 100 mg by mouth daily. (Patient not taking: Reported on 04/07/2020)  . traMADol (ULTRAM) 50 MG tablet Take 1-2 tablets (50-100 mg total) by mouth every 6 (six) hours as needed for moderate pain. (Patient not taking: Reported on 04/07/2020)  . Turmeric 500 MG TABS Take 500 mg by mouth daily.    No facility-administered encounter medications on file as of 04/28/2020.    Current Diagnosis: Patient Active Problem List   Diagnosis Date Noted  . Primary osteoarthritis of right knee 12/23/2019  . Symptomatic bradycardia 05/31/2019  . Vertigo 07/27/2018  . Multiple closed fractures of ribs of right side 01/21/2018  . Thiamine deficiency 01/19/2018  . Peripheral neuropathy 07/13/2017  . Dyslipidemia 07/13/2017  . Preventative health care 01/11/2017  . Carotid artery disease ([place]) 07/01/2016  . Mitral valve disorder 07/01/2016  . Low back pain 12/31/2015  . Osteopenia 12/31/2015  . Allergic state 12/31/2015  .  Skin lesion 12/31/2015  . History of chicken pox   . Calculi, ureter 03/30/2015  . OA (osteoarthritis) of knee 05/10/2014  . Pacemaker-St.Jude 02/01/2012  . Permanent atrial fibrillation ([place])   . Hypertension   . Heart murmur   . Chronic anticoagulation     Goals Addressed   None     Chronic Care Management   Outreach Note  05/05/2020 Name: Brian Davidson MRN: [id number] DOB: 08/23/1936  Referred by: Mosie Lukes, MD Reason for referral : Chronic Care Management  Unable to  make contact with this patient to complete initial questions prior to their initial visit the clinical pharmacist.  Fanny Skates, Mariano Colon Pharmacist Assistant [phone]

## 2020-05-01 ENCOUNTER — Other Ambulatory Visit: Payer: Self-pay

## 2020-05-01 ENCOUNTER — Ambulatory Visit: Payer: PPO | Admitting: Pharmacist

## 2020-05-01 DIAGNOSIS — E785 Hyperlipidemia, unspecified: Secondary | ICD-10-CM

## 2020-05-01 DIAGNOSIS — I10 Essential (primary) hypertension: Secondary | ICD-10-CM

## 2020-05-01 NOTE — Chronic Care Management (AMB) (Addendum)
Chronic Care Management Pharmacy  Name: Brian Davidson  MRN: [id number] DOB: [birth year]   Chief Complaint/ HPI  Brian Davidson,  84 y.o. , male presents for their Initial CCM visit with the clinical pharmacist via telephone due to COVID-19 Pandemic.  PCP : Brian Lukes, MD  Their chronic conditions include: Hypertension, Hyperlipidemia/CAD, AFib, Osteopenia, Neuropathy/Pain  Office Visits: 04/07/20: Visit w/ Brian Pai, PA-C - Abdominal pain. Referral to general surgery.  11/18/19: Visit w/ Dr. Charlett Davidson -  No med changes noted. Recommended patient to take weekly vitals.  Consult Visit: 12/10/19: Cardio visit w/ Dr. Irish Davidson - No med changes. RTC 1 year.  Medications: Outpatient Encounter Medications as of 05/01/2020  Medication Sig Note   Biotin 5000 MCG TABS Take 5,000 mg by mouth daily.    Calcium Carbonate-Vitamin D (CALTRATE 600+D PO) Take 600 mg by mouth daily.     Cholecalciferol 50 MCG (2000 UT) TABS Take 2,000 Units by mouth daily.     Cyanocobalamin (VITAMIN B 12 PO) Take 5,000 mcg by mouth daily.     diclofenac Sodium (VOLTAREN) 1 % GEL Apply topically daily as needed (Pain).  05/01/2020: As needed (1-2 times per week)   diltiazem (CARDIZEM CD) 240 MG 24 hr capsule TAKE ONE (1) CAPSULE EACH Brian Davidson BY MOUTH    gabapentin (NEURONTIN) 300 MG capsule Take 1 capsule (300 mg total) by mouth 2 (two) times daily.    irbesartan-hydrochlorothiazide (AVALIDE) 150-12.5 MG tablet Take 1/2 (one-half) tablet by mouth once daily    magnesium oxide (MAG-OX) 400 MG tablet Take 400 mg by mouth daily.    metoprolol tartrate (LOPRESSOR) 100 MG tablet Take 1 tablet (100 mg total) by mouth 2 (two) times daily.    rivaroxaban (XARELTO) 20 MG TABS tablet Take 1 tablet (20 mg total) by mouth daily.    Turmeric 500 MG TABS Take 500 mg by mouth daily.     acetaminophen (TYLENOL) 500 MG tablet Take 1,000 mg by mouth every 6 (six) hours as needed for headache. (Patient not taking:  Reported on 04/07/2020)    loratadine (CLARITIN) 10 MG tablet Take 10 mg by mouth daily as needed for allergies.  (Patient not taking: Reported on 04/07/2020)    methocarbamol (ROBAXIN) 500 MG tablet Take 1 tablet (500 mg total) by mouth every 6 (six) hours as needed for muscle spasms. (Patient not taking: Reported on 04/07/2020)    oxyCODONE (OXY IR/ROXICODONE) 5 MG immediate release tablet Take 1-2 tablets (5-10 mg total) by mouth every 6 (six) hours as needed for severe pain. Not to exceed 6 tablets a Carmine Carrozza. (Patient not taking: Reported on 04/07/2020)    thiamine 100 MG tablet Take 100 mg by mouth daily. (Patient not taking: Reported on 04/07/2020)    traMADol (ULTRAM) 50 MG tablet Take 1-2 tablets (50-100 mg total) by mouth every 6 (six) hours as needed for moderate pain. (Patient not taking: Reported on 04/07/2020)    No facility-administered encounter medications on file as of 05/01/2020.   SDOH Screenings   Alcohol Screen:    Last Alcohol Screening Score (AUDIT): Not on file  Depression (PHQ2-9): Low Risk    PHQ-2 Score: 0  Financial Resource Strain: Low Risk    Difficulty of Paying Living Expenses: Not hard at all  Food Insecurity: No Food Insecurity   Worried About Charity fundraiser in the Last Year: Never true   Ran Out of Food in the Last Year: Never true  Housing: Low Risk  Last Housing Risk Score: 0  Physical Activity: Sufficiently Active   Days of Exercise per Week: 7 days   Minutes of Exercise per Session: 30 min  Social Connections:    Frequency of Communication with Friends and Family: Not on file   Frequency of Social Gatherings with Friends and Family: Not on file   Attends Religious Services: Not on file   Active Member of Clubs or Organizations: Not on file   Attends Archivist Meetings: Not on file   Marital Status: Not on file  Stress:    Feeling of Stress : Not on file  Tobacco Use: Medium Risk   Smoking Tobacco Use: Former Smoker    Smokeless Tobacco Use: Never Used  Transportation Needs: No Data processing manager (Medical): No   Davidson of Transportation (Non-Medical): No     Current Diagnosis/Assessment:  Goals Addressed            This Visit's Progress    Chronic Care Management Pharmacy Care Plan       CARE PLAN ENTRY (see longitudinal plan of care for additional care plan information)  Current Barriers:   Chronic Disease Management support, education, and care coordination needs related to Hypertension, Hyperlipidemia/CAD, AFib, Osteopenia, Neuropathy/Pain   Hypertension BP Readings from Last 3 Encounters:  04/07/20 120/60  12/24/19 (!) 156/85  12/16/19 (!) 148/75    Pharmacist Clinical Goal(s): o Over the next 90 days, patient will work with PharmD and providers to achieve BP goal <140/90  Current regimen:   Irebesartan/hctz 150/12.20m 1/2 tab daily EM  Metoprolol tartrate 1091mtwice daily  Interventions: o Requested patient to check blood pressure 2-3 times per week and record  Patient self care activities - Over the next 90 days, patient will: o Check BP 2-3 times per week, document, and provide at future appointments o Ensure daily salt intake < 2300 mg/Brian Davidson  Hyperlipidemia Lab Results  Component Value Date/Time   LDLCALC 108 (H) 11/18/2019 09:23 AM    Pharmacist Clinical Goal(s): o Over the next 90 days, patient will work with PharmD and providers to achieve LDL goal < 70  Current regimen:  o Diet and exercise management    Interventions: o Discussed LDL goal  Patient self care activities - Over the next 90 days, patient will: o Improve Diet and exercise management   Osteopenia  Pharmacist Clinical Goal(s) o Over the next 90 days, patient will work with PharmD and providers to reduce risk of fracture due to osteopenia  Current regimen:   Calcium 60032maily  Vitamin D 2000 units daily   Interventions: o Consider repeat DEXA Scan  Patient  self care activities - Over the next 90 days, patient will: o Maintain osteopenia medication regimen   Medication management  Pharmacist Clinical Goal(s): o Over the next 90 days, patient will work with PharmD and providers to maintain optimal medication adherence  Current pharmacy: [facility]  Interventions o Comprehensive medication review performed. 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  Hypertension   BP goal is:  <140/90  Office blood pressures are  BP Readings from  Last 3 Encounters:  04/07/20 120/60  12/24/19 (!) 156/85  12/16/19 (!) 148/75   Patient checks BP at home when feeling symptomatic Patient home BP readings are ranging: Unable to assess  Patient has failed these meds in the past: None noted  Patient is currently uncontrolled on the following medications:   Irebesartan/hctz 150/12.62m 1/2 tab daily EM  Metoprolol tartrate 1028mtwice daily  Diet: Lots of veggies. Wife cooks for him.   Exercise: Walks 2-2.5 miles per [place].  We discussed BP goal  Plan -Continue current medications  -Check blood pressure 2-3 times per week and record    Hyperlipidemia/CAD   LDL goal <70  Lipid Panel     Component Value Date/Time   CHOL 160 11/18/2019 0923   TRIG 99.0 11/18/2019 0923   HDL 32.70 (L) 11/18/2019 0923   LDLCALC 108 (H) 11/18/2019 0923    Hepatic Function Latest Ref Rng & Units 12/16/2019 11/18/2019 06/05/2019  Total Protein 6.5 - 8.1 g/dL 7.0 6.7 6.6  Albumin 3.5 - 5.0 g/dL 3.9 3.8 4.0  AST 15 - 41 U/L 21 17 16   ALT 0 - 44 U/L 17 13 12   Alk Phosphatase 38 -  126 U/L 64 72 71  Total Bilirubin 0.3 - 1.2 mg/dL 0.7 1.0 0.9  Bilirubin, Direct 0.0 - 0.3 mg/dL - - -     The ASCVD Risk score ([place] et al., 2013) failed to calculate for the following reasons:   The 2013 ASCVD risk score is only valid for ages 4037o 7933 Patient has failed these meds in past: None noted  Patient is currently uncontrolled on the following medications:   None  We discussed:  Indication for statin therapy noting his LDL and hx of CAD. Pt not agreeable to therapy today. Will continue discussion  Plan -Continue current medications   AFIB   Patient is currently rate controlled.  Patient has failed these meds in past: None noted  Patient is currently controlled on the following medications:   Xarelto 2057maily EM  Diltiazem CD 240m84mily AM  Metoprolol tartrate 100mg26mce daily  Can not feel symptoms.  Has a pacemaker. Got it placed 5-6 years ago  Plan -Continue current medications   Osteopenia   Last DEXA Scan: 01/25/2018  T-Score femoral neck: -1.6 (R)  T-Score lumbar spine: 0.6  10-year probability of major osteoporotic fracture: 18.6%  10-year probability of hip fracture: 13.4%  No results found for: VD25OH   Patient is a candidate for pharmacologic treatment due to T-Score -1.0 to -2.5 and 10-year risk of hip fracture > 3%  Patient has failed these meds in past: None noted  Patient is currently uncontrolled on the following medications:   Calcium 600mg 90my  Vitamin D 2000 units daily  Pt is aware his hip fracture risk is high. Discussed that he is indicated for treatment due to this.  Hesitant to start. Will continue discussion.  Plan -Consider repeat DEXA Scan  -Continue current medications  Neuropathy/Pain    Patient has failed these meds in past: None noted  Patient is currently controlled on the following medications:  Gabapentin 300mg t74m daily AM + EM  Tries to keep moving to keep from pain  progessing  Plan -Continue current medications  Vaccines   Reviewed and discussed patient's vaccination history.  Patient up to date on vaccines.  Immunization History  Administered Date(s) Administered   Influenza, High Dose Seasonal PF 06/18/2017, 06/11/2018, 04/29/2019, 04/28/2020  Influenza-Unspecified 06/11/2016   PFIZER SARS-COV-2 Vaccination 09/24/2019, 10/10/2019   Pneumococcal Conjugate-13 07/20/2015   Pneumococcal Polysaccharide-23 09/05/2016   Tdap 11/14/2014, 05/29/2019   Zoster Recombinat (Shingrix) 12/26/2016, 03/21/2017    Medication Management   Pt uses [facility] for all medications Uses pill box? Yes Pt endorses 99% compliance  We discussed: Offered [facility], but patient is very specific about NDCs he needs for his diltiazem so he doesn't want to change pharmacies.  Plan -Continue current medication management strategy  Follow up: 3 month phone visit  Miscellaneous Meds Feels all of his supplements help him Magnesium 432m daily AM Turmeric 5075mdaily AM (hand/joint pain) Vitamin B12 5000 daily AM (wife said he should take this) Vitamin B1 100076maily AM (from Dr. BlyCharlett BlakeMeds to D/C from list Loratadine (not needed per patient)

## 2020-05-01 NOTE — Patient Instructions (Signed)
Visit Information  Goals Addressed            This Visit's Progress   . Chronic Care Management Pharmacy Care Plan       CARE PLAN ENTRY (see longitudinal plan of care for additional care plan information)  Current Barriers:  . Chronic Disease Management support, education, and care coordination needs related to Hypertension, Hyperlipidemia/CAD, AFib, Osteopenia, Neuropathy/Pain   Hypertension BP Readings from Last 3 Encounters:  04/07/20 120/60  12/24/19 (!) 156/85  12/16/19 (!) 148/75   . Pharmacist Clinical Goal(s): o Over the next 90 days, patient will work with PharmD and providers to achieve BP goal <140/90 . Current regimen:  . Irebesartan/hctz 150/12.5mg  1/2 tab daily EM . Metoprolol tartrate 100mg  twice daily . Interventions: o Requested patient to check blood pressure 2-3 times per week and record . Patient self care activities - Over the next 90 days, patient will: o Check BP 2-3 times per week, document, and provide at future appointments o Ensure daily salt intake < 2300 mg/Adline Kirshenbaum  Hyperlipidemia Lab Results  Component Value Date/Time   LDLCALC 108 (H) 11/18/2019 09:23 AM   . Pharmacist Clinical Goal(s): o Over the next 90 days, patient will work with PharmD and providers to achieve LDL goal < 70 . Current regimen:  o Diet and exercise management   . Interventions: o Discussed LDL goal . Patient self care activities - Over the next 90 days, patient will: o Improve Diet and exercise management   Osteopenia . Pharmacist Clinical Goal(s) o Over the next 90 days, patient will work with PharmD and providers to reduce risk of fracture due to osteopenia . Current regimen:  . Calcium 600mg  daily . Vitamin D 2000 units daily  . Interventions: o Consider repeat DEXA Scan . Patient self care activities - Over the next 90 days, patient will: o Maintain osteopenia medication regimen   Medication management . Pharmacist Clinical Goal(s): o Over the next 90 days,  patient will work with PharmD and providers to maintain optimal medication adherence . Current pharmacy: [facility] . Interventions o Comprehensive medication review performed. o Continue current medication management strategy . Patient self care activities - Over the next 90 days, patient will: o Focus on medication adherence by filling and taking medications appropriately  o Take medications as prescribed o Report any questions or concerns to PharmD and/or provider(s)  Initial goal documentation        Brian Davidson was given information about Chronic Care Management services today including:  1. CCM service includes personalized support from designated clinical staff supervised by his physician, including individualized plan of care and coordination with other care providers 2. 24/7 contact phone numbers for assistance for urgent and routine care needs. 3. Standard insurance, coinsurance, copays and deductibles apply for chronic care management only during months in which we provide at least 20 minutes of these services. Most insurances cover these services at 100%, however patients may be responsible for any copay, coinsurance and/or deductible if applicable. This service may help you avoid the need for more expensive face-to-face services. 4. Only one practitioner may furnish and bill the service in a calendar month. 5. The patient may stop CCM services at any time (effective at the end of the month) by phone call to the office staff.  Patient agreed to services and verbal consent obtained.   The patient verbalized understanding of instructions provided today and agreed to receive a mailed copy of patient instruction and/or educational materials. Telephone follow  up appointment with pharmacy team member scheduled for: 07/22/2020  Melvenia Beam Tom Macpherson, PharmD Clinical Pharmacist [facility] [phone]   How to Take Your Blood Pressure You can take your blood  pressure at home with a machine. You may need to check your blood pressure at home:  To check if you have high blood pressure (hypertension).  To check your blood pressure over time.  To make sure your blood pressure medicine is working. Supplies needed: You will need a blood pressure machine, or monitor. You can buy one at a drugstore or online. When choosing one:  Choose one with an arm cuff.  Choose one that wraps around your upper arm. Only one finger should fit between your arm and the cuff.  Do not choose one that measures your blood pressure from your wrist or finger. Your doctor can suggest a monitor. How to prepare Avoid these things for 30 minutes before checking your blood pressure:  Drinking caffeine.  Drinking alcohol.  Eating.  Smoking.  Exercising. Five minutes before checking your blood pressure:  Pee.  Sit in a dining chair. Avoid sitting in a soft couch or armchair.  Be quiet. Do not talk. How to take your blood pressure Follow the instructions that came with your machine. If you have a digital blood pressure monitor, these may be the instructions: 1. Sit up straight. 2. Place your feet on the floor. Do not cross your ankles or legs. 3. Rest your left arm at the level of your heart. You may rest it on a table, desk, or chair. 4. Pull up your shirt sleeve. 5. Wrap the blood pressure cuff around the upper part of your left arm. The cuff should be 1 inch (2.5 cm) above your elbow. It is best to wrap the cuff around bare skin. 6. Fit the cuff snugly around your arm. You should be able to place only one finger between the cuff and your arm. 7. Put the cord inside the groove of your elbow. 8. Press the power button. 9. Sit quietly while the cuff fills with air and loses air. 10. Write down the numbers on the screen. 11. Wait 2-3 minutes and then repeat steps 1-10. What do the numbers mean? Two numbers make up your blood pressure. The first number is called  systolic pressure. The second is called diastolic pressure. An example of a blood pressure reading is "120 over 80" (or 120/80). If you are an adult and do not have a medical condition, use this guide to find out if your blood pressure is normal: Normal  First number: below 120.  Second number: below 80. Elevated  First number: 120-129.  Second number: below 80. Hypertension stage 1  First number: 130-139.  Second number: 80-89. Hypertension stage 2  First number: 140 or above.  Second number: 59 or above. Your blood pressure is above normal even if only the top or bottom number is above normal. Follow these instructions at home:  Check your blood pressure as often as your doctor tells you to.  Take your monitor to your next doctor's appointment. Your doctor will: ? Make sure you are using it correctly. ? Make sure it is working right.  Make sure you understand what your blood pressure numbers should be.  Tell your doctor if your medicines are causing side effects. Contact a doctor if:  Your blood pressure keeps being high. Get help right away if:  Your first blood pressure number is higher  than 180.  Your second blood pressure number is higher than 120. This information is not intended to replace advice given to you by your health care provider. Make sure you discuss any questions you have with your health care provider. Document Revised: 08/04/2017 Document Reviewed: 01/29/2016 Elsevier Patient Education  2020 [employer].

## 2020-05-07 DIAGNOSIS — L84 Corns and callosities: Secondary | ICD-10-CM | POA: Diagnosis not present

## 2020-05-07 DIAGNOSIS — M79672 Pain in left foot: Secondary | ICD-10-CM | POA: Diagnosis not present

## 2020-05-07 DIAGNOSIS — M79671 Pain in right foot: Secondary | ICD-10-CM | POA: Diagnosis not present

## 2020-05-08 ENCOUNTER — Other Ambulatory Visit: Payer: Self-pay

## 2020-05-08 ENCOUNTER — Other Ambulatory Visit: Payer: PPO | Admitting: *Deleted

## 2020-05-08 DIAGNOSIS — I712 Thoracic aortic aneurysm, without rupture, unspecified: Secondary | ICD-10-CM

## 2020-05-08 DIAGNOSIS — Z01812 Encounter for preprocedural laboratory examination: Secondary | ICD-10-CM

## 2020-05-08 LAB — BASIC METABOLIC PANEL
BUN/Creatinine Ratio: 15 (ref 10–24)
BUN: 20 mg/dL (ref 8–27)
CO2: 24 mmol/L (ref 20–29)
Calcium: 9.1 mg/dL (ref 8.6–10.2)
Chloride: 102 mmol/L (ref 96–106)
Creatinine, Ser: 1.34 mg/dL — ABNORMAL HIGH (ref 0.76–1.27)
GFR calc Af Amer: 57 mL/min/{1.73_m2} — ABNORMAL LOW (ref >59)
GFR calc non Af Amer: 49 mL/min/{1.73_m2} — ABNORMAL LOW (ref >59)
Glucose: 87 mg/dL (ref 65–99)
Potassium: 3.9 mmol/L (ref 3.5–5.2)
Sodium: 141 mmol/L (ref 134–144)

## 2020-05-12 ENCOUNTER — Other Ambulatory Visit: Payer: Self-pay

## 2020-05-12 ENCOUNTER — Ambulatory Visit (INDEPENDENT_AMBULATORY_CARE_PROVIDER_SITE_OTHER)
Admission: RE | Admit: 2020-05-12 | Discharge: 2020-05-12 | Disposition: A | Discharge status: Home / Self Care | Payer: PPO | Source: Ambulatory Visit | Attending: Interventional Cardiology | Admitting: Interventional Cardiology

## 2020-05-12 DIAGNOSIS — I712 Thoracic aortic aneurysm, without rupture, unspecified: Secondary | ICD-10-CM

## 2020-05-12 DIAGNOSIS — J90 Pleural effusion, not elsewhere classified: Secondary | ICD-10-CM | POA: Diagnosis not present

## 2020-05-12 DIAGNOSIS — J9811 Atelectasis: Secondary | ICD-10-CM | POA: Diagnosis not present

## 2020-05-12 DIAGNOSIS — I700 Atherosclerosis of aorta: Secondary | ICD-10-CM | POA: Diagnosis not present

## 2020-05-12 MED ORDER — IOHEXOL 350 MG/ML SOLN
100.0000 mL | Freq: Once | INTRAVENOUS | Status: AC | PRN
  Administered 2020-05-12: 100 mL via INTRAVENOUS

## 2020-05-14 ENCOUNTER — Other Ambulatory Visit: Payer: Self-pay | Admitting: *Deleted

## 2020-05-14 DIAGNOSIS — I10 Essential (primary) hypertension: Secondary | ICD-10-CM

## 2020-05-14 DIAGNOSIS — E785 Hyperlipidemia, unspecified: Secondary | ICD-10-CM

## 2020-05-14 DIAGNOSIS — M25572 Pain in left ankle and joints of left foot: Secondary | ICD-10-CM | POA: Diagnosis not present

## 2020-05-14 NOTE — Progress Notes (Signed)
am

## 2020-05-15 ENCOUNTER — Other Ambulatory Visit: Payer: Self-pay

## 2020-05-15 DIAGNOSIS — I712 Thoracic aortic aneurysm, without rupture, unspecified: Secondary | ICD-10-CM

## 2020-05-15 DIAGNOSIS — I77810 Thoracic aortic ectasia: Secondary | ICD-10-CM

## 2020-05-18 ENCOUNTER — Telehealth: Payer: Self-pay | Admitting: Pharmacist

## 2020-05-18 NOTE — Progress Notes (Addendum)
Chronic Care Management Pharmacy Assistant   Name: Brian Davidson  MRN: [id number] DOB: 1936-08-15  Reason for Encounter: Disease State  Patient Questions:  1.  Have you seen any other providers since your last visit? Yes, orthopaedic surgery  2.  Any changes in your medicines or health? No  PCP : Brian Lukes, MD   Their chronic conditions include: Hypertension, Hyperlipidemia/CAD, AFib, Osteopenia, Neuropathy/Pain  Consults:  05-14-2020 (Orthopaedic surgery)  Patient was presented in the office with Brian Davidson for pain of left ankle joint.  Allergies:   Allergies  Allergen Reactions  . Cardizem [Diltiazem]     Rash from Yellow dye in generic capsule. Can take different brand. Takes diltiazem - his allergy is to Cardizem  . Diltiazem Hcl     Rash from red dye in generic capsule. Can take different brand. Takes diltiazem - his allergy is to Cardizem Rash from red dye in generic capsule. Can take different brand. Takes diltiazem - his allergy is to Cardizem  . Sulfa Antibiotics     Unknown childhood reaction    Medications: Outpatient Encounter Medications as of 05/18/2020  Medication Sig Note  . acetaminophen (TYLENOL) 500 MG tablet Take 1,000 mg by mouth every 6 (six) hours as needed for headache. (Patient not taking: Reported on 04/07/2020)   . Biotin 5000 MCG TABS Take 5,000 mg by mouth daily.   . Calcium Carbonate-Vitamin D (CALTRATE 600+D PO) Take 600 mg by mouth daily.    . Cholecalciferol 50 MCG (2000 UT) TABS Take 2,000 Units by mouth daily.    . Cyanocobalamin (VITAMIN B 12 PO) Take 5,000 mcg by mouth daily.    . diclofenac Sodium (VOLTAREN) 1 % GEL Apply topically daily as needed (Pain).  05/01/2020: As needed (1-2 times per week)  . diltiazem (CARDIZEM CD) 240 MG 24 hr capsule TAKE ONE (1) CAPSULE EACH DAY BY MOUTH   . gabapentin (NEURONTIN) 300 MG capsule Take 1 capsule (300 mg total) by mouth 2 (two) times daily.   .  irbesartan-hydrochlorothiazide (AVALIDE) 150-12.5 MG tablet Take 1/2 (one-half) tablet by mouth once daily   . loratadine (CLARITIN) 10 MG tablet Take 10 mg by mouth daily as needed for allergies.  (Patient not taking: Reported on 04/07/2020)   . magnesium oxide (MAG-OX) 400 MG tablet Take 400 mg by mouth daily.   . methocarbamol (ROBAXIN) 500 MG tablet Take 1 tablet (500 mg total) by mouth every 6 (six) hours as needed for muscle spasms. (Patient not taking: Reported on 04/07/2020)   . metoprolol tartrate (LOPRESSOR) 100 MG tablet Take 1 tablet (100 mg total) by mouth 2 (two) times daily.   Marland Kitchen oxyCODONE (OXY IR/ROXICODONE) 5 MG immediate release tablet Take 1-2 tablets (5-10 mg total) by mouth every 6 (six) hours as needed for severe pain. Not to exceed 6 tablets a day. (Patient not taking: Reported on 04/07/2020)   . rivaroxaban (XARELTO) 20 MG TABS tablet Take 1 tablet (20 mg total) by mouth daily.   Marland Kitchen thiamine 100 MG tablet Take 100 mg by mouth daily. (Patient not taking: Reported on 04/07/2020)   . traMADol (ULTRAM) 50 MG tablet Take 1-2 tablets (50-100 mg total) by mouth every 6 (six) hours as needed for moderate pain. (Patient not taking: Reported on 04/07/2020)   . Turmeric 500 MG TABS Take 500 mg by mouth daily.     No facility-administered encounter medications on file as of 05/18/2020.    Current Diagnosis: Patient Active Problem List  Diagnosis Date Noted  . Primary osteoarthritis of right knee 12/23/2019  . Symptomatic bradycardia 05/31/2019  . Vertigo 07/27/2018  . Multiple closed fractures of ribs of right side 01/21/2018  . Thiamine deficiency 01/19/2018  . Peripheral neuropathy 07/13/2017  . Dyslipidemia 07/13/2017  . Preventative health care 01/11/2017  . Carotid artery disease (Brian Davidson) 07/01/2016  . Mitral valve disorder 07/01/2016  . Low back pain 12/31/2015  . Osteopenia 12/31/2015  . Allergic state 12/31/2015  . Skin lesion 12/31/2015  . History of chicken pox   . Calculi,  ureter 03/30/2015  . OA (osteoarthritis) of knee 05/10/2014  . Pacemaker-Brian Davidson 02/01/2012  . Permanent atrial fibrillation (Brian Davidson)   . Hypertension   . Heart murmur   . Chronic anticoagulation     Goals Addressed   None    Reviewed chart prior to disease state call. Spoke with patient regarding BP  Recent Office Vitals: BP Readings from Last 3 Encounters:  04/07/20 120/60  12/24/19 (!) 156/85  12/16/19 (!) 148/75   Pulse Readings from Last 3 Encounters:  04/07/20 61  12/24/19 87  12/16/19 62    Wt Readings from Last 3 Encounters:  04/07/20 221 lb (100.2 kg)  12/23/19 220 lb 5 oz (99.9 kg)  12/16/19 220 lb 5 oz (99.9 kg)     Kidney Function Lab Results  Component Value Date/Time   CREATININE 1.34 (H) 05/08/2020 08:02 AM   CREATININE 1.38 (H) 12/24/2019 02:50 AM   GFR 52.36 (L) 11/18/2019 09:23 AM   GFRNONAA 49 (L) 05/08/2020 08:02 AM   GFRAA 57 (L) 05/08/2020 08:02 AM    BMP Latest Ref Rng & Units 05/08/2020 12/24/2019 12/16/2019  Glucose 65 - 99 mg/dL 87 157(H) 96  BUN 8 - 27 mg/dL 20 26(H) 22  Creatinine 0.76 - 1.27 mg/dL 1.34(H) 1.38(H) 1.26(H)  BUN/Creat Ratio 10 - 24 15 - -  Sodium 134 - 144 mmol/L 141 137 139  Potassium 3.5 - 5.2 mmol/L 3.9 4.2 4.4  Chloride 96 - 106 mmol/L 102 104 104  CO2 20 - 29 mmol/L 24 24 26   Calcium 8.6 - 10.2 mg/dL 9.1 8.6(L) 9.4    . Current antihypertensive regimen:   Irebesartan/hctz 150/12.5mg  1/2 tab daily EM  Metoprolol tartrate 100mg  twice daily . How often are you checking your Blood Pressure? 1-2x per week . Current home BP readings:   119/67 on 05-13-20  135/64 on 05-16-2020   154/80 on [phone] Patient stated that he just walked into the house and received my message and took BP. Marland Kitchen What recent interventions/DTPs have been made by any provider to improve Blood Pressure control since last CPP Visit: None . Any recent hospitalizations or ED visits since last visit with CPP? No . What diet changes have been made to  improve Blood Pressure Control?  o None; Patient states that he eats healthy already due to wife being a nurse so they rarely use salt. . What exercise is being done to improve your Blood Pressure Control?  o Patient states he walks 2.5 miles daily  Adherence Review: Is the patient currently on ACE/ARB medication? Yes Irbesartan-hydrochlorothiazide 150-12.5mg  Does the patient have >5 day gap between last estimated fill dates? No  Follow-Up:  Pharmacist Review   [place] Shannon, [facility] [place] Pharmacist Assistant [phone]  Reviewed by: De Blanch, PharmD Clinical Pharmacist [facility] [phone]

## 2020-05-26 ENCOUNTER — Other Ambulatory Visit: Payer: Self-pay

## 2020-05-26 ENCOUNTER — Encounter: Payer: Self-pay | Admitting: Family Medicine

## 2020-05-26 ENCOUNTER — Ambulatory Visit (INDEPENDENT_AMBULATORY_CARE_PROVIDER_SITE_OTHER): Payer: PPO | Admitting: Family Medicine

## 2020-05-26 VITALS — BP 126/68 | HR 76 | Temp 97.5°F | Resp 12 | Ht 72.0 in | Wt 216.8 lb

## 2020-05-26 DIAGNOSIS — I712 Thoracic aortic aneurysm, without rupture, unspecified: Secondary | ICD-10-CM

## 2020-05-26 DIAGNOSIS — I10 Essential (primary) hypertension: Secondary | ICD-10-CM | POA: Diagnosis not present

## 2020-05-26 DIAGNOSIS — R739 Hyperglycemia, unspecified: Secondary | ICD-10-CM

## 2020-05-26 DIAGNOSIS — E519 Thiamine deficiency, unspecified: Secondary | ICD-10-CM | POA: Diagnosis not present

## 2020-05-26 DIAGNOSIS — I4821 Permanent atrial fibrillation: Secondary | ICD-10-CM

## 2020-05-26 DIAGNOSIS — G6289 Other specified polyneuropathies: Secondary | ICD-10-CM

## 2020-05-26 DIAGNOSIS — Z7901 Long term (current) use of anticoagulants: Secondary | ICD-10-CM

## 2020-05-26 DIAGNOSIS — E785 Hyperlipidemia, unspecified: Secondary | ICD-10-CM | POA: Diagnosis not present

## 2020-05-26 DIAGNOSIS — Z0000 Encounter for general adult medical examination without abnormal findings: Secondary | ICD-10-CM | POA: Diagnosis not present

## 2020-05-26 MED ORDER — RIVAROXABAN 20 MG PO TABS: 20.0000 mg | ORAL_TABLET | Freq: Every day | ORAL | 1 refills | Status: DC

## 2020-05-26 NOTE — Patient Instructions (Addendum)
Monkfruit instead of Splenda Preventive Care 65 Years and Older, Male Preventive care refers to lifestyle choices and visits with your health care provider that can promote health and wellness. This includes:  A yearly physical exam. This is also called an annual well check.  Regular dental and eye exams.  Immunizations.  Screening for certain conditions.  Healthy lifestyle choices, such as diet and exercise. What can I expect for my preventive care visit? Physical exam Your health care provider will check:  Height and weight. These may be used to calculate body mass index (BMI), which is a measurement that tells if you are at a healthy weight.  Heart rate and blood pressure.  Your skin for abnormal spots. Counseling Your health care provider may ask you questions about:  Alcohol, tobacco, and drug use.  Emotional well-being.   Home and relationship well-being.  Sexual activity.  Eating habits.  History of falls.  Memory and ability to understand (cognition).  Work and work Statistician. What immunizations do I need?  Influenza (flu) vaccine  This is recommended every year. Tetanus, diphtheria, and pertussis (Tdap) vaccine  You may need a Td booster every 10 years. Varicella (chickenpox) vaccine  You may need this vaccine if you have not already been vaccinated. Zoster (shingles) vaccine  You may need this after age 96. Pneumococcal conjugate (PCV13) vaccine  One dose is recommended after age 81. Pneumococcal polysaccharide (PPSV23) vaccine  One dose is recommended after age 71. Measles, mumps, and rubella (MMR) vaccine  You may need at least one dose of MMR if you were born in 1957 or later. You may also need a second dose. Meningococcal conjugate (MenACWY) vaccine  You may need this if you have certain conditions. Hepatitis A vaccine  You may need this if you have certain conditions or if you travel or work in places where you may be exposed to  hepatitis A. Hepatitis B vaccine  You may need this if you have certain conditions or if you travel or work in places where you may be exposed to hepatitis B. Haemophilus influenzae type b (Hib) vaccine  You may need this if you have certain conditions. You may receive vaccines as individual doses or as more than one vaccine together in one shot (combination vaccines). Talk with your health care provider about the risks and benefits of combination vaccines. What tests do I need? Blood tests  Lipid and cholesterol levels. These may be checked every 5 years, or more frequently depending on your overall health.  Hepatitis C test.  Hepatitis B test. Screening  Lung cancer screening. You may have this screening every year starting at age 106 if you have a 30-pack-year history of smoking and currently smoke or have quit within the past 15 years.  Colorectal cancer screening. All adults should have this screening starting at age 68 and continuing until age 85. Your health care provider may recommend screening at age 31 if you are at increased risk. You will have tests every 1-10 years, depending on your results and the type of screening test.  Prostate cancer screening. Recommendations will vary depending on your family history and other risks.  Diabetes screening. This is done by checking your blood sugar (glucose) after you have not eaten for a while (fasting). You may have this done every 1-3 years.  Abdominal aortic aneurysm (AAA) screening. You may need this if you are a current or former smoker.  Sexually transmitted disease (STD) testing. Follow these instructions at home:  Eating and drinking  Eat a diet that includes fresh fruits and vegetables, whole grains, lean protein, and low-fat dairy products. Limit your intake of foods with high amounts of sugar, saturated fats, and salt.  Take vitamin and mineral supplements as recommended by your health care provider.  Do not drink  alcohol if your health care provider tells you not to drink.  If you drink alcohol: ? Limit how much you have to 0-2 drinks a day. ? Be aware of how much alcohol is in your drink. In the [place] one drink equals one 12 oz bottle of beer (355 mL), one 5 oz glass of wine (148 mL), or one 1 oz glass of hard liquor (44 mL). Lifestyle  Take daily care of your teeth and gums.  Stay active. Exercise for at least 30 minutes on 5 or more days each week.  Do not use any products that contain nicotine or tobacco, such as cigarettes, e-cigarettes, and chewing tobacco. If you need help quitting, ask your health care provider.  If you are sexually active, practice safe sex. Use a condom or other form of protection to prevent STIs (sexually transmitted infections).  Talk with your health care provider about taking a low-dose aspirin or statin. What's next?  Visit your health care provider once a year for a well check visit.  Ask your health care provider how often you should have your eyes and teeth checked.  Stay up to date on all vaccines. This information is not intended to replace advice given to you by your health care provider. Make sure you discuss any questions you have with your health care provider. Document Revised: 08/16/2018 Document Reviewed: 08/16/2018 Elsevier Patient Education  2020 [employer].

## 2020-05-27 ENCOUNTER — Ambulatory Visit: Payer: PPO | Admitting: Internal Medicine

## 2020-05-27 ENCOUNTER — Other Ambulatory Visit: Payer: Self-pay | Admitting: Family Medicine

## 2020-05-27 ENCOUNTER — Encounter: Payer: Self-pay | Admitting: Internal Medicine

## 2020-05-27 VITALS — BP 142/72 | HR 78 | Ht 72.0 in | Wt 218.0 lb

## 2020-05-27 DIAGNOSIS — I4821 Permanent atrial fibrillation: Secondary | ICD-10-CM | POA: Diagnosis not present

## 2020-05-27 DIAGNOSIS — Z950 Presence of cardiac pacemaker: Secondary | ICD-10-CM | POA: Diagnosis not present

## 2020-05-27 DIAGNOSIS — I495 Sick sinus syndrome: Secondary | ICD-10-CM | POA: Diagnosis not present

## 2020-05-27 DIAGNOSIS — D6869 Other thrombophilia: Secondary | ICD-10-CM | POA: Diagnosis not present

## 2020-05-27 DIAGNOSIS — I712 Thoracic aortic aneurysm, without rupture, unspecified: Secondary | ICD-10-CM | POA: Insufficient documentation

## 2020-05-27 LAB — COMPREHENSIVE METABOLIC PANEL
AG Ratio: 1.4 (calc) (ref 1.0–2.5)
ALT: 12 U/L (ref 9–46)
AST: 18 U/L (ref 10–35)
Albumin: 4.2 g/dL (ref 3.6–5.1)
Alkaline phosphatase (APISO): 80 U/L (ref 35–144)
BUN/Creatinine Ratio: 16 (calc) (ref 6–22)
BUN: 21 mg/dL (ref 7–25)
CO2: 28 mmol/L (ref 20–32)
Calcium: 9.3 mg/dL (ref 8.6–10.3)
Chloride: 103 mmol/L (ref 98–110)
Creat: 1.31 mg/dL — ABNORMAL HIGH (ref 0.70–1.11)
Globulin: 3.1 g/dL (calc) (ref 1.9–3.7)
Glucose, Bld: 98 mg/dL (ref 65–99)
Potassium: 4.3 mmol/L (ref 3.5–5.3)
Sodium: 140 mmol/L (ref 135–146)
Total Bilirubin: 1 mg/dL (ref 0.2–1.2)
Total Protein: 7.3 g/dL (ref 6.1–8.1)

## 2020-05-27 LAB — CUP PACEART INCLINIC DEVICE CHECK
Battery Remaining Longevity: 124 mo
Battery Voltage: 2.95 V
Brady Statistic RV Percent Paced: 9.1 %
Date Time Interrogation Session: 20210922170508
Implantable Lead Implant Date: 20130528
Implantable Lead Location: 753860
Implantable Lead Model: 1948
Implantable Pulse Generator Implant Date: 20130528
Lead Channel Impedance Value: 662.5 Ohm
Lead Channel Pacing Threshold Amplitude: 0.5 V
Lead Channel Pacing Threshold Pulse Width: 0.4 ms
Lead Channel Sensing Intrinsic Amplitude: 12 mV
Lead Channel Setting Pacing Amplitude: 2.5 V
Lead Channel Setting Pacing Pulse Width: 0.4 ms
Lead Channel Setting Sensing Sensitivity: 2 mV
Pulse Gen Model: 1210
Pulse Gen Serial Number: 7328888

## 2020-05-27 LAB — LIPID PANEL
Cholesterol: 168 mg/dL (ref <200)
HDL: 40 mg/dL (ref >=40)
LDL Cholesterol (Calc): 107 mg/dL (calc) — ABNORMAL HIGH
Non-HDL Cholesterol (Calc): 128 mg/dL (calc) (ref <130)
Total CHOL/HDL Ratio: 4.2 (calc) (ref <5.0)
Triglycerides: 110 mg/dL (ref <150)

## 2020-05-27 LAB — CBC
HCT: 45.7 % (ref 38.5–50.0)
Hemoglobin: 15.1 g/dL (ref 13.2–17.1)
MCH: 29.2 pg (ref 27.0–33.0)
MCHC: 33 g/dL (ref 32.0–36.0)
MCV: 88.4 fL (ref 80.0–100.0)
MPV: 11.6 fL (ref 7.5–12.5)
Platelets: 157 10*3/uL (ref 140–400)
RBC: 5.17 10*6/uL (ref 4.20–5.80)
RDW: 13.7 % (ref 11.0–15.0)
WBC: 6.5 10*3/uL (ref 3.8–10.8)

## 2020-05-27 LAB — HEMOGLOBIN A1C
Hgb A1c MFr Bld: 5.7 % of total Hgb — ABNORMAL HIGH (ref <5.7)
Mean Plasma Glucose: 117 (calc)
eAG (mmol/L): 6.5 (calc)

## 2020-05-27 LAB — TSH: TSH: 2.33 mIU/L (ref 0.40–4.50)

## 2020-05-27 NOTE — Assessment & Plan Note (Signed)
Given samples of xarelto

## 2020-05-27 NOTE — Progress Notes (Signed)
PCP: Mosie Lukes, MD   Primary EP:  Dr Rayann Heman  [name] is a 84 y.o. male who presents today for routine electrophysiology followup.  Since last being seen in our clinic, the patient reports doing very well.  Today, he denies symptoms of palpitations, chest pain, shortness of breath,  lower extremity edema, dizziness, presyncope, or syncope.  The patient is otherwise without complaint today.   Past Medical History:  Diagnosis Date  . Arthritis   . Biliary dyskinesia   . Carotid artery disease ([place]) 07/01/2016  . Dyslipidemia 07/13/2017  . Dysrhythmia    a fib  . GERD (gastroesophageal reflux disease)    occasional  . H/O measles   . History of chicken pox   . History of kidney stones   . Hypertension   . Neuromuscular disorder (H[facility])    neuropathy feet   . Persistent atrial fibrillation ([place])   . Pneumonia 1992  . Presence of permanent cardiac pacemaker   . Tachycardia-bradycardia ([place])    a. s/p STJ dual chamber pacemaker  . Thiamine deficiency 01/19/2018  . Valvular heart disease 07/01/2016   Past Surgical History:  Procedure Laterality Date  . CHOLECYSTECTOMY N/A 11/24/2015   Procedure: LAPAROSCOPIC CHOLECYSTECTOMY;  Surgeon: Greer Pickerel, MD;  Location: [facility] ORS;  Service: General;  Laterality: N/A;  . CYSTOSCOPY  2016  . EYE SURGERY     bil cataract  . FRACTURE SURGERY  2005   left ankle  . HERNIA REPAIR     left groin, no mesh  . PERMANENT PACEMAKER INSERTION N/A 01/31/2012   SJM Accent SR RF implanted by DR Marcos Peloso  . TOTAL KNEE ARTHROPLASTY Right 12/23/2019   Procedure: TOTAL KNEE ARTHROPLASTY;  Surgeon: Gaynelle Arabian, MD;  Location: [facility]S;  Service: Orthopedics;  Laterality: Right;  30min    ROS- all systems are reviewed and negative except as per HPI above  Current Outpatient Medications  Medication Sig Dispense Refill  . Biotin 5000 MCG TABS Take 5,000 mg by mouth daily.    . Calcium Carbonate-Vitamin D (CALTRATE 600+D PO) Take 600 mg by mouth  daily.     . Cholecalciferol 50 MCG (2000 UT) TABS Take 2,000 Units by mouth daily.     . diclofenac Sodium (VOLTAREN) 1 % GEL Apply topically daily as needed (Pain).     Marland Kitchen diltiazem (CARDIZEM CD) 240 MG 24 hr capsule TAKE ONE (1) CAPSULE EACH DAY BY MOUTH 90 capsule 3  . gabapentin (NEURONTIN) 300 MG capsule Take 1 capsule by mouth twice daily 180 capsule 1  . irbesartan-hydrochlorothiazide (AVALIDE) 150-12.5 MG tablet Take 1/2 (one-half) tablet by mouth once daily 45 tablet 1  . magnesium oxide (MAG-OX) 400 MG tablet Take 400 mg by mouth daily.    . metoprolol tartrate (LOPRESSOR) 100 MG tablet Take 1 tablet by mouth twice daily 180 tablet 1  . rivaroxaban (XARELTO) 20 MG TABS tablet Take 1 tablet (20 mg total) by mouth daily. 90 tablet 1  . thiamine 100 MG tablet Take 100 mg by mouth daily.     . Turmeric 500 MG TABS Take 500 mg by mouth daily.      No current facility-administered medications for this visit.    Physical Exam: Vitals:   05/27/20 1627  BP: (!) 142/72  Pulse: 78  SpO2: 97%  Weight: 218 lb (98.9 kg)  Height: 6' (1.829 m)    GEN- The patient is well appearing, alert and oriented x 3 today.  Head- normocephalic, atraumatic Eyes-  Sclera clear, conjunctiva pink Ears- hearing intact Oropharynx- clear Lungs-  normal work of breathing Chest- pacemaker pocket is well healed Heart- iRRR GI- soft  Extremities- no clubbing, cyanosis, + edema, + vericosities, s/p R knee surgery  Pacemaker interrogation- reviewed in detail today,  See PACEART report  Echo 05/29/19 reviewed- EF 60%  ekg tracing ordered today is personally reviewed and shows afib, V rates 70s  Assessment and Plan:  1. Symptomatic bradycardia  Normal pacemaker function See Pace Art report No changes today he is not device dependant today  2. Permanent afib Rate controlled chads2vasc score is 4 Doing well with xarelto  3. HTN Stable No change required today   Return to see EP PA in a  year  Risks, benefits and potential toxicities for medications prescribed and/or refilled reviewed with patient today.   Thompson Grayer MD, [facility] 05/27/2020 4:45 PM

## 2020-05-27 NOTE — Patient Instructions (Addendum)
Medication Instructions:  Your physician recommends that you continue on your current medications as directed. Please refer to the Current Medication list given to you today.  *If you need a refill on your cardiac medications before your next appointment, please call your pharmacy*  Lab Work: None ordered.  If you have labs (blood work) drawn today and your tests are completely normal, you will receive your results only by: Marland Kitchen MyChart Message (if you have MyChart) OR . A paper copy in the mail If you have any lab test that is abnormal or we need to change your treatment, we will call you to review the results.  Testing/Procedures: None ordered.  Follow-Up: At [facility], you and your health needs are our priority.  As part of our continuing mission to provide you with exceptional heart care, we have created designated Provider Care Teams.  These Care Teams include your primary Cardiologist (physician) and Advanced Practice Providers (APPs -  Physician Assistants and Nurse Practitioners) who all work together to provide you with the care you need, when you need it.  We recommend signing up for the patient portal called "MyChart".  Sign up information is provided on this After Visit Summary.  MyChart is used to connect with patients for Virtual Visits (Telemedicine).  Patients are able to view lab/test results, encounter notes, upcoming appointments, etc.  Non-urgent messages can be sent to your provider as well.   To learn more about what you can do with MyChart, go to [email or web address].    Your next appointment:   Your physician wants you to follow-up in: 1 year with Oda Kilts.You will receive a reminder letter in the mail two months in advance. If you don't receive a letter, please call our office to schedule the follow-up appointment.  Remote monitoring is used to monitor your Pacemaker from home. This monitoring reduces the number of office visits required to check your device  to one time per year. It allows [place] to keep an eye on the functioning of your device to ensure it is working properly. You are scheduled for a device check from home on 06/02/20. You may send your transmission at any time that day. If you have a wireless device, the transmission will be sent automatically. After your physician reviews your transmission, you will receive a postcard with your next transmission date.  Other Instructions:

## 2020-05-27 NOTE — Progress Notes (Signed)
Subjective:    Patient ID: Brian Davidson, male    DOB: 05-07-1936, 84 y.o.   MRN: [id number]  Chief Complaint  Patient presents with  . Annual Exam    fasting    HPI Patient is in today for annual preventative exam and follow up on  Chronic medical concerns. No recent febrile illness or hospitaliations. No urinary concerns and he sees urology soon to have his PSA evaluated. He is maintaining masking and quarantine and tolerated his Covid shots. He stays active and maintains a heart healthy diet. Denies CP/palp/SOB/HA/congestion/fevers/GI or GU c/o. Taking meds as prescribed  Past Medical History:  Diagnosis Date  . Arthritis   . Biliary dyskinesia   . Carotid artery disease ([place]) 07/01/2016  . Dyslipidemia 07/13/2017  . Dysrhythmia    a fib  . GERD (gastroesophageal reflux disease)    occasional  . H/O measles   . History of chicken pox   . History of kidney stones   . Hypertension   . Neuromuscular disorder (HCC)    neuropathy feet   . Persistent atrial fibrillation ([place])   . Pneumonia 1992  . Presence of permanent cardiac pacemaker   . Tachycardia-bradycardia ([place])    a. s/p STJ dual chamber pacemaker  . Thiamine deficiency 01/19/2018  . Valvular heart disease 07/01/2016    Past Surgical History:  Procedure Laterality Date  . CHOLECYSTECTOMY N/A 11/24/2015   Procedure: LAPAROSCOPIC CHOLECYSTECTOMY;  Surgeon: Greer Pickerel, MD;  Location: [facility];  Service: General;  Laterality: N/A;  . CYSTOSCOPY  2016  . EYE SURGERY     bil cataract  . FRACTURE SURGERY  2005   left ankle  . HERNIA REPAIR     left groin, no mesh  . PERMANENT PACEMAKER INSERTION N/A 01/31/2012   SJM Accent SR RF implanted by DR Allred  . TOTAL KNEE ARTHROPLASTY Right 12/23/2019   Procedure: TOTAL KNEE ARTHROPLASTY;  Surgeon: Gaynelle Arabian, MD;  Location: [facility];  Service: Orthopedics;  Laterality: Right;  24min    Family History  Problem Relation Age of Onset  . Hypertension Mother   .  Arthritis Mother   . Drug abuse Mother   . Stroke Father   . Heart disease Father   . Heart disease Sister 73  . Heart disease Brother   . Hypertension Son   . Heart disease Sister   . Neuropathy Brother   . Hypertension Brother   . Atrial fibrillation Brother   . Atrial fibrillation Sister   . Colon cancer Neg Hx     Social History   Socioeconomic History  . Marital status: Married    Spouse name: [name]  . Number of children: 1  . Years of education: Not on file  . Highest education level: Not on file  Occupational History  . Occupation: Retired  . 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 Smoking status: Former Smoker    Years: 10.00    Types: Pipe, Cigars    Quit date: 11/18/1985    Years since quitting: 34.5  . Smokeless tobacco: Never Used  Vaping Use  . Vaping Use: Never used  Substance and Sexual Activity  . Alcohol use: Yes    Alcohol/week: 3.0 standard drinks    Types: 3 Cans of beer per week    Comment: occ  . Drug use: No  . Sexual activity: Not Currently  Other Topics Concern  . Not on file  Social History Narrative  Lives with wife, still works part-time as a Geophysicist/field seismologist. He is active around the house and yard...   Parents are deceased and did not have cardiac issues but he has 2 siblings with atrial fibrillation and a sister-in-law  has a pacemaker.   Social Determinants of Health   Financial Resource Strain: Low Risk   . Difficulty of Paying Living Expenses: Not hard at all  Food Insecurity: No Food Insecurity  . Worried About Charity fundraiser in the Last Year: Never true  . Ran Out of Food in the Last Year: Never true  Transportation Needs: No Transportation Needs  . Lack of Transportation (Medical): No  . Lack of Transportation (Non-Medical): No  Physical Activity: Sufficiently Active  . Days of Exercise per Week: 7 days  . Minutes of Exercise per Session: 30 min  Stress:   . Feeling of Stress : Not on file  Social  Connections:   . Frequency of Communication with Friends and Family: Not on file  . Frequency of Social Gatherings with Friends and Family: Not on file  . Attends Religious Services: Not on file  . Active Member of Clubs or Organizations: Not on file  . Attends Archivist Meetings: Not on file  . Marital Status: Not on file  Intimate Partner Violence:   . Fear of Current or Ex-Partner: Not on file  . Emotionally Abused: Not on file  . Physically Abused: Not on file  . Sexually Abused: Not on file    Outpatient Medications Prior to Visit  Medication Sig Dispense Refill  . Biotin 5000 MCG TABS Take 5,000 mg by mouth daily.    . Calcium Carbonate-Vitamin D (CALTRATE 600+D PO) Take 600 mg by mouth daily.     . Cholecalciferol 50 MCG (2000 UT) TABS Take 2,000 Units by mouth daily.     . diclofenac Sodium (VOLTAREN) 1 % GEL Apply topically daily as needed (Pain).     Marland Kitchen diltiazem (CARDIZEM CD) 240 MG 24 hr capsule TAKE ONE (1) CAPSULE EACH DAY BY MOUTH 90 capsule 3  . irbesartan-hydrochlorothiazide (AVALIDE) 150-12.5 MG tablet Take 1/2 (one-half) tablet by mouth once daily 45 tablet 1  . magnesium oxide (MAG-OX) 400 MG tablet Take 400 mg by mouth daily.    Marland Kitchen thiamine 100 MG tablet Take 100 mg by mouth daily.     . Turmeric 500 MG TABS Take 500 mg by mouth daily.     Marland Kitchen acetaminophen (TYLENOL) 500 MG tablet Take 1,000 mg by mouth every 6 (six) hours as needed for headache. (Patient not taking: Reported on 04/07/2020)    . Cyanocobalamin (VITAMIN B 12 PO) Take 5,000 mcg by mouth daily.     Marland Kitchen gabapentin (NEURONTIN) 300 MG capsule Take 1 capsule (300 mg total) by mouth 2 (two) times daily. 180 capsule 3  . loratadine (CLARITIN) 10 MG tablet Take 10 mg by mouth daily as needed for allergies.  (Patient not taking: Reported on 04/07/2020)    . methocarbamol (ROBAXIN) 500 MG tablet Take 1 tablet (500 mg total) by mouth every 6 (six) hours as needed for muscle spasms. (Patient not taking: Reported  on 04/07/2020) 40 tablet 0  . metoprolol tartrate (LOPRESSOR) 100 MG tablet Take 1 tablet (100 mg total) by mouth 2 (two) times daily. 180 tablet 0  . oxyCODONE (OXY IR/ROXICODONE) 5 MG immediate release tablet Take 1-2 tablets (5-10 mg total) by mouth every 6 (six) hours as needed for severe pain. Not to exceed 6  tablets a day. (Patient not taking: Reported on 04/07/2020) 56 tablet 0  . rivaroxaban (XARELTO) 20 MG TABS tablet Take 1 tablet (20 mg total) by mouth daily. 90 tablet 0  . traMADol (ULTRAM) 50 MG tablet Take 1-2 tablets (50-100 mg total) by mouth every 6 (six) hours as needed for moderate pain. (Patient not taking: Reported on 04/07/2020) 40 tablet 0   No facility-administered medications prior to visit.    Allergies  Allergen Reactions  . Cardizem [Diltiazem]     Rash from Yellow dye in generic capsule. Can take different brand. Takes diltiazem - his allergy is to Cardizem  . Diltiazem Hcl     Rash from red dye in generic capsule. Can take different brand. Takes diltiazem - his allergy is to Cardizem Rash from red dye in generic capsule. Can take different brand. Takes diltiazem - his allergy is to Cardizem  . Sulfa Antibiotics     Unknown childhood reaction    Review of Systems  Constitutional: Negative for chills, fever and malaise/fatigue.  HENT: Negative for congestion and hearing loss.   Eyes: Negative for discharge.  Respiratory: Negative for cough, sputum production and shortness of breath.   Cardiovascular: Negative for chest pain, palpitations and leg swelling.  Gastrointestinal: Negative for abdominal pain, blood in stool, constipation, diarrhea, heartburn, nausea and vomiting.  Genitourinary: Negative for dysuria, frequency, hematuria and urgency.  Musculoskeletal: Negative for back pain, falls and myalgias.  Skin: Negative for rash.  Neurological: Negative for dizziness, sensory change, loss of consciousness, weakness and headaches.  Endo/Heme/Allergies:  Negative for environmental allergies. Does not bruise/bleed easily.  Psychiatric/Behavioral: Negative for depression and suicidal ideas. The patient is not nervous/anxious and does not have insomnia.        Objective:    Physical Exam Vitals and nursing note reviewed.  Constitutional:      General: He is not in acute distress.    Appearance: Normal appearance. He is well-developed. He is not ill-appearing.  HENT:     Head: Normocephalic and atraumatic.     Right Ear: Tympanic membrane, ear canal and external ear normal.     Left Ear: Tympanic membrane, ear canal and external ear normal.     Nose: Nose normal.  Eyes:     General:        Right eye: No discharge.        Left eye: No discharge.     Extraocular Movements: Extraocular movements intact.     Conjunctiva/sclera: Conjunctivae normal.     Pupils: Pupils are equal, round, and reactive to light.  Cardiovascular:     Rate and Rhythm: Normal rate. Rhythm irregular.     Heart sounds: No murmur heard.   Pulmonary:     Effort: Pulmonary effort is normal.     Breath sounds: Normal breath sounds.  Abdominal:     General: Bowel sounds are normal.     Palpations: Abdomen is soft. There is no mass.     Tenderness: There is no abdominal tenderness. There is no rebound.  Musculoskeletal:     Cervical back: Normal range of motion and neck supple.  Skin:    General: Skin is warm and dry.     Coloration: Skin is not pale.  Neurological:     Mental Status: He is alert and oriented to person, place, and time.     Gait: Gait normal.     Deep Tendon Reflexes: Reflexes normal.     BP 126/68 (BP Location: Right  Arm, Patient Position: Sitting, Cuff Size: Large)   Pulse 76   Temp (!) 97.5 F (36.4 C) (Oral)   Resp 12   Ht 6' (1.829 m)   Wt 216 lb 12.8 oz (98.3 kg)   SpO2 97%   BMI 29.40 kg/m  Wt Readings from Last 3 Encounters:  05/27/20 218 lb (98.9 kg)  05/26/20 216 lb 12.8 oz (98.3 kg)  04/07/20 221 lb (100.2 kg)     Diabetic Foot Exam - Simple   No data filed     Lab Results  Component Value Date   WBC 6.5 05/26/2020   HGB 15.1 05/26/2020   HCT 45.7 05/26/2020   PLT 157 05/26/2020   GLUCOSE 98 05/26/2020   CHOL 168 05/26/2020   TRIG 110 05/26/2020   HDL 40 05/26/2020   LDLCALC 107 (H) 05/26/2020   ALT 12 05/26/2020   AST 18 05/26/2020   NA 140 05/26/2020   K 4.3 05/26/2020   CL 103 05/26/2020   CREATININE 1.31 (H) 05/26/2020   BUN 21 05/26/2020   CO2 28 05/26/2020   TSH 2.33 05/26/2020   INR 1.5 (H) 12/16/2019   HGBA1C 5.7 (H) 05/26/2020    Lab Results  Component Value Date   TSH 2.33 05/26/2020   Lab Results  Component Value Date   WBC 6.5 05/26/2020   HGB 15.1 05/26/2020   HCT 45.7 05/26/2020   MCV 88.4 05/26/2020   PLT 157 05/26/2020   Lab Results  Component Value Date   NA 140 05/26/2020   K 4.3 05/26/2020   CO2 28 05/26/2020   GLUCOSE 98 05/26/2020   BUN 21 05/26/2020   CREATININE 1.31 (H) 05/26/2020   BILITOT 1.0 05/26/2020   ALKPHOS 64 12/16/2019   AST 18 05/26/2020   ALT 12 05/26/2020   PROT 7.3 05/26/2020   ALBUMIN 3.9 12/16/2019   CALCIUM 9.3 05/26/2020   ANIONGAP 9 12/24/2019   GFR 52.36 (L) 11/18/2019   Lab Results  Component Value Date   CHOL 168 05/26/2020   Lab Results  Component Value Date   HDL 40 05/26/2020   Lab Results  Component Value Date   LDLCALC 107 (H) 05/26/2020   Lab Results  Component Value Date   TRIG 110 05/26/2020   Lab Results  Component Value Date   CHOLHDL 4.2 05/26/2020   Lab Results  Component Value Date   HGBA1C 5.7 (H) 05/26/2020       Assessment & Plan:   Problem List Items Addressed This Visit    Permanent atrial fibrillation ([place])    Rate controlled and tolerating Xarelto      Relevant Medications   rivaroxaban (XARELTO) 20 MG TABS tablet   Hypertension - Primary    Well controlled, no changes to meds. Encouraged heart healthy diet such as the DASH diet and exercise as tolerated.        Relevant Medications   rivaroxaban (XARELTO) 20 MG TABS tablet   Other Relevant Orders   CBC (Completed)   Comprehensive metabolic panel (Completed)   TSH (Completed)   Chronic anticoagulation    Given samples of xarelto      Preventative health care    Patient encouraged to maintain heart healthy diet, regular exercise, adequate sleep. Consider daily probiotics. Take medications as prescribed. Labs ordered and reviewed, immunizations are reviewed      Peripheral neuropathy   Dyslipidemia    Encouraged heart healthy diet, increase exercise, avoid trans fats, consider a krill oil cap daily  Relevant Orders   Lipid panel (Completed)   Thiamine deficiency    Other Visit Diagnoses    Hyperglycemia       Relevant Orders   Hemoglobin A1c (Completed)   Thoracic aortic aneurysm without rupture (HCC)       Relevant Medications   rivaroxaban (XARELTO) 20 MG TABS tablet      I have discontinued [name] "[name]"'s loratadine, Cyanocobalamin (VITAMIN B 12 PO), acetaminophen, oxyCODONE, traMADol, and methocarbamol. I am also having him maintain his Turmeric, diltiazem, magnesium oxide, Cholecalciferol, Calcium Carbonate-Vitamin D (CALTRATE 600+D PO), thiamine, Biotin, diclofenac Sodium, irbesartan-hydrochlorothiazide, and rivaroxaban.  Meds ordered this encounter  Medications  . rivaroxaban (XARELTO) 20 MG TABS tablet    Sig: Take 1 tablet (20 mg total) by mouth daily.    Dispense:  90 tablet    Refill:  1     [name], MD

## 2020-05-27 NOTE — Assessment & Plan Note (Signed)
Rate controlled and tolerating Xarelto 

## 2020-05-27 NOTE — Assessment & Plan Note (Signed)
Patient encouraged to maintain heart healthy diet, regular exercise, adequate sleep. Consider daily probiotics. Take medications as prescribed. Labs ordered and reviewed, immunizations are reviewed

## 2020-05-27 NOTE — Assessment & Plan Note (Signed)
Well controlled, no changes to meds. Encouraged heart healthy diet such as the DASH diet and exercise as tolerated.  °

## 2020-05-27 NOTE — Assessment & Plan Note (Signed)
He is awaiting d

## 2020-05-27 NOTE — Assessment & Plan Note (Signed)
Encouraged heart healthy diet, increase exercise, avoid trans fats, consider a krill oil cap daily 

## 2020-06-01 ENCOUNTER — Institutional Professional Consult (permissible substitution): Payer: PPO | Admitting: Cardiothoracic Surgery

## 2020-06-01 ENCOUNTER — Other Ambulatory Visit: Payer: Self-pay

## 2020-06-01 VITALS — BP 135/69 | HR 73 | Temp 97.9°F | Resp 20 | Ht 72.0 in | Wt 215.0 lb

## 2020-06-01 DIAGNOSIS — I712 Thoracic aortic aneurysm, without rupture: Secondary | ICD-10-CM

## 2020-06-01 DIAGNOSIS — I7121 Aneurysm of the ascending aorta, without rupture: Secondary | ICD-10-CM

## 2020-06-01 NOTE — Progress Notes (Signed)
[address]       [place] [address]             [place] REPORT  Referring Provider is Jettie Booze, MD Primary Cardiologist is Larae Grooms, MD PCP is Mosie Lukes, MD  Chief Complaint  Patient presents with  . Thoracic Aortic Aneurysm    Surgical consult,  CTA Chest 05/12/2020, ECHO 05/29/2019    HPI:  Pleasant 84 year old gentleman with longstanding history of atrial fibrillation and status post pacemaker insertion and family history of aneurysm in his mother presents for surgical consultation regarding ascending aortic aneurysm.  This was first detected on echocardiogram 4 years ago and has been serially followed by echo.  Earlier this month however, he underwent his first CT scan which demonstrates a 5 cm ascending aortic aneurysm.  He has no prior personal history of aneurysm but a ruptured aneurysm in his mother.  His blood pressure is well controlled and he does not smoke. Other than atrial fibrillation he has no prior cardiac history  Past Medical History:  Diagnosis Date  . Arthritis   . Biliary dyskinesia   . Carotid artery disease ([place]) 07/01/2016  . Dyslipidemia 07/13/2017  . Dysrhythmia    a fib  . GERD (gastroesophageal reflux disease)    occasional  . H/O measles   . History of chicken pox   . History of kidney stones   . Hypertension   . Neuromuscular disorder (HCC)    neuropathy feet   . Persistent atrial fibrillation ([place])   . Pneumonia 1992  . Presence of permanent cardiac pacemaker   . Tachycardia-bradycardia ([place])    a. s/p STJ dual chamber pacemaker  . Thiamine deficiency 01/19/2018  . Valvular heart disease 07/01/2016    Past Surgical History:  Procedure Laterality Date  . CHOLECYSTECTOMY N/A 11/24/2015   Procedure: LAPAROSCOPIC CHOLECYSTECTOMY;  Surgeon: Greer Pickerel, MD;  Location: [facility];  Service: General;  Laterality: N/A;  . CYSTOSCOPY  2016  . EYE  SURGERY     bil cataract  . FRACTURE SURGERY  2005   left ankle  . HERNIA REPAIR     left groin, no mesh  . PERMANENT PACEMAKER INSERTION N/A 01/31/2012   SJM Accent SR RF implanted by DR Allred  . TOTAL KNEE ARTHROPLASTY Right 12/23/2019   Procedure: TOTAL KNEE ARTHROPLASTY;  Surgeon: Gaynelle Arabian, MD;  Location: [facility];  Service: Orthopedics;  Laterality: Right;  52min    Family History  Problem Relation Age of Onset  . Hypertension Mother   . Arthritis Mother   . Drug abuse Mother   . Stroke Father   . Heart disease Father   . Heart disease Sister 23  . Heart disease Brother   . Hypertension Son   . Heart disease Sister   . Neuropathy Brother   . Hypertension Brother   . Atrial fibrillation Brother   . Atrial fibrillation Sister   . Colon cancer Neg Hx     Social History   Socioeconomic History  . Marital status: Married    Spouse name: [name]  . Number of children: 1  . Years of education: Not on file  . Highest education level: Not on file  Occupational History  . Occupation: Retired  . 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 Smoking status: Former Smoker    Years: 10.00    Types: Pipe, Landscape architect  Quit date: 11/18/1985    Years since quitting: 34.5  . Smokeless tobacco: Never Used  Vaping Use  . Vaping Use: Never used  Substance and Sexual Activity  . Alcohol use: Yes    Alcohol/week: 3.0 standard drinks    Types: 3 Cans of beer per week    Comment: occ  . Drug use: No  . Sexual activity: Not Currently  Other Topics Concern  . Not on file  Social History Narrative   Lives with wife, still works part-time as a Geophysicist/field seismologist. He is active around the house and yard...   Parents are deceased and did not have cardiac issues but he has 2 siblings with atrial fibrillation and a sister-in-law  has a pacemaker.   Social Determinants of Health   Financial Resource Strain: Low Risk   . Difficulty of Paying Living Expenses: Not hard at all  Food  Insecurity: No Food Insecurity  . Worried About Charity fundraiser in the Last Year: Never true  . Ran Out of Food in the Last Year: Never true  Transportation Needs: No Transportation Needs  . Lack of Transportation (Medical): No  . Lack of Transportation (Non-Medical): No  Physical Activity: Sufficiently Active  . Days of Exercise per Week: 7 days  . Minutes of Exercise per Session: 30 min  Stress:   . Feeling of Stress : Not on file  Social Connections:   . Frequency of Communication with Friends and Family: Not on file  . Frequency of Social Gatherings with Friends and Family: Not on file  . Attends Religious Services: Not on file  . Active Member of Clubs or Organizations: Not on file  . Attends Archivist Meetings: Not on file  . Marital Status: Not on file  Intimate Partner Violence:   . Fear of Current or Ex-Partner: Not on file  . Emotionally Abused: Not on file  . Physically Abused: Not on file  . Sexually Abused: Not on file    Current Outpatient Medications  Medication Sig Dispense Refill  . Biotin 5000 MCG TABS Take 5,000 mg by mouth daily.    . Calcium Carbonate-Vitamin D (CALTRATE 600+D PO) Take 600 mg by mouth daily.     . Cholecalciferol 50 MCG (2000 UT) TABS Take 2,000 Units by mouth daily.     . diclofenac Sodium (VOLTAREN) 1 % GEL Apply topically daily as needed (Pain).     Marland Kitchen diltiazem (CARDIZEM CD) 240 MG 24 hr capsule TAKE ONE (1) CAPSULE EACH DAY BY MOUTH 90 capsule 3  . gabapentin (NEURONTIN) 300 MG capsule Take 1 capsule by mouth twice daily 180 capsule 1  . irbesartan-hydrochlorothiazide (AVALIDE) 150-12.5 MG tablet Take 1/2 (one-half) tablet by mouth once daily 45 tablet 1  . magnesium oxide (MAG-OX) 400 MG tablet Take 400 mg by mouth daily.    . metoprolol tartrate (LOPRESSOR) 100 MG tablet Take 1 tablet by mouth twice daily 180 tablet 1  . rivaroxaban (XARELTO) 20 MG TABS tablet Take 1 tablet (20 mg total) by mouth daily. 90 tablet 1  .  thiamine 100 MG tablet Take 100 mg by mouth daily.     . Turmeric 500 MG TABS Take 500 mg by mouth daily.      No current facility-administered medications for this visit.    Allergies  Allergen Reactions  . Cardizem [Diltiazem]     Rash from Yellow dye in generic capsule. Can take different brand. Takes diltiazem - his allergy is to Cardizem  .  Diltiazem Hcl     Rash from red dye in generic capsule. Can take different brand. Takes diltiazem - his allergy is to Cardizem Rash from red dye in generic capsule. Can take different brand. Takes diltiazem - his allergy is to Cardizem  . Sulfa Antibiotics     Unknown childhood reaction      Review of Systems:   General:  Unremarkable  Cardiac:  history of atrial fibrillation, endorses dizzy spells, history of pacemaker insertion by Dr. Rayann Heman  Respiratory:  No shortness of breath,   GI:   Negative  GU:   Negative  Neuro:   Denies stroke/ TIA's,   Musculoskeletal: History of arthritis,   Skin:   Negative  Psych:   Negative  Eyes:   Does wear glasses   ENT:   + dentures, last saw dentist 3 months prior  Hematologic:  Negative  Endocrine:  No diabetes, does not check CBG's at home     Physical Exam:   BP 135/69   Pulse 73   Temp 97.9 F (36.6 C) (Skin)   Resp 20   Ht 6' (1.829 m)   Wt 97.5 kg   SpO2 99% Comment: RA  BMI 29.16 kg/m   General:    well-appearing  HEENT:  Unremarkable   Neck:   no JVD, no bruits, no adenopathy   Chest:   clear to auscultation, symmetrical breath sounds, no wheezes, no rhonchi   CV:   RRR, no  murmur   Abdomen:  soft, non-tender, no masses   Extremities:  warm, well-perfused, pulses intact, no LE edema; bilateral varicosities right greater than left  Rectal/GU  Deferred  Neuro:   Grossly non-focal and symmetrical throughout  Skin:   Clean and dry, no rashes, no breakdown   Diagnostic Tests:  I have personally reviewed his available imaging studies including CT chest from  18-May-2020 which demonstrates a 5 cm saccular aneurysm just above the level of the sinotubular junction.   Impression:  Pleasant 84 year old man with known ascending aortic aneurysm and a family history of aneurysmal disease presents with approximately 5 cm ascending aortic aneurysm.  It is unclear as to the history of the aneurysm in regards to size changes over the years due to different modalities of measurement   Plan:  Follow-up in 6 months with repeat noncontrast chest CT to assess aneurysm degeneration Report to nearest emergency department for sudden severe chest or back pain Continue to follow strict blood pressure control   I spent in excess of 30 minutes during the conduct of this office consultation and >50% of this time involved direct face-to-face encounter with the patient for counseling and/or coordination of their care.          Level 3 Office Consult = 40 minutes         Level 4 Office Consult = 60 minutes         Level 5 Office Consult = 80 minutes  B.  Murvin Natal, MD 06/01/2020 11:10 AM

## 2020-06-02 ENCOUNTER — Ambulatory Visit (INDEPENDENT_AMBULATORY_CARE_PROVIDER_SITE_OTHER): Payer: PPO | Admitting: Emergency Medicine

## 2020-06-02 DIAGNOSIS — I495 Sick sinus syndrome: Secondary | ICD-10-CM

## 2020-06-02 LAB — CUP PACEART REMOTE DEVICE CHECK
Battery Remaining Longevity: 125 mo
Battery Remaining Percentage: 91 %
Battery Voltage: 2.95 V
Brady Statistic RV Percent Paced: 12 %
Date Time Interrogation Session: 20210928035758
Implantable Lead Implant Date: 20130528
Implantable Lead Location: 753860
Implantable Lead Model: 1948
Implantable Pulse Generator Implant Date: 20130528
Lead Channel Impedance Value: 640 Ohm
Lead Channel Pacing Threshold Amplitude: 0.5 V
Lead Channel Pacing Threshold Pulse Width: 0.4 ms
Lead Channel Sensing Intrinsic Amplitude: 12 mV
Lead Channel Setting Pacing Amplitude: 2.5 V
Lead Channel Setting Pacing Pulse Width: 0.4 ms
Lead Channel Setting Sensing Sensitivity: 2 mV
Pulse Gen Model: 1210
Pulse Gen Serial Number: 7328888

## 2020-06-05 NOTE — Progress Notes (Signed)
Remote pacemaker transmission.   

## 2020-06-10 DIAGNOSIS — M67962 Unspecified disorder of synovium and tendon, left lower leg: Secondary | ICD-10-CM | POA: Diagnosis not present

## 2020-06-10 DIAGNOSIS — R972 Elevated prostate specific antigen [PSA]: Secondary | ICD-10-CM | POA: Diagnosis not present

## 2020-06-10 DIAGNOSIS — M25572 Pain in left ankle and joints of left foot: Secondary | ICD-10-CM | POA: Diagnosis not present

## 2020-06-10 NOTE — Progress Notes (Signed)
Cardiology Office Note   Date:  06/11/2020   ID:  AURTHER Davidson, DOB 11-21-1935, MRN [id number]  PCP:  Mosie Lukes, MD    No chief complaint on file.  AFib  Wt Readings from Last 3 Encounters:  06/11/20 219 lb (99.3 kg)  06/01/20 215 lb (97.5 kg)  05/27/20 218 lb (98.9 kg)       History of Present Illness: Brian Davidson is a 84 y.o. male  With a pacemaker, followed by Dr. Rayann Heman.  He saw Dr. Rayford Halsted 20 years ago for AFib.  He had cardioversion many years ago and was treated with Coumadin.  THis was later switched to Xarelto.  He is referred to me by EP PA, Oda Kilts for :"I wanted to establish him with gen cards for management of his non EP issues (HTN, carotid artery disease, moderate ascending aorta dilation, and moderate mitral stenosis) since he has been stable from a PPM/AFib standpoint and only requires annual follow up at this time   Echo in 05/2019: "Left ventricular ejection fraction, by visual estimation, is 60 to  65%. The left ventricle has normal function. Normal left ventricular size.  There is no left ventricular hypertrophy.  2. Left ventricular diastolic function could not be evaluated pattern of  LV diastolic filling.  3. Global right ventricle has normal systolic function.The right  ventricular size is normal. No increase in right ventricular wall  thickness.  4. Left atrial size was moderately dilated.  5. Right atrial size was normal.  6. The mitral valve is normal in structure. No evidence of mitral valve  regurgitation. Moderate mitral stenosis.  7. The tricuspid valve is normal in structure. Tricuspid valve  regurgitation is mild.  8. The aortic valve is normal in structure. Aortic valve regurgitation is  mild by color flow Doppler. Mild aortic valve sclerosis without stenosis.  9. The pulmonic valve was normal in structure. Pulmonic valve  regurgitation is not visualized by color flow Doppler.  10. There is moderate  dilatation of the ascending aorta measuring 43 mm.  11. Normal pulmonary artery systolic pressure.  12. The inferior vena cava is normal in size with greater than 50%  respiratory variability, suggesting right atrial pressure of 3 mmHg. "  Since the last visit, he has done well.  His only complaint is that when he stands from a sitting position, he gets dizzy.  He holds a wall and is ok after a few seconds.   Had knee replacement on 4/19.   He had both COVID shots in 2021.   Since the last visit, he has done well.   Denies : Chest pain. Dizziness. Leg edema. Nitroglycerin use. Orthopnea. Palpitations. Paroxysmal nocturnal dyspnea. Shortness of breath. Syncope.   Walks 2.5 miles daily.  Exercise tolerance better after pacer.     Past Medical History:  Diagnosis Date  . Arthritis   . Biliary dyskinesia   . Carotid artery disease ([place]) 07/01/2016  . Dyslipidemia 07/13/2017  . Dysrhythmia    a fib  . GERD (gastroesophageal reflux disease)    occasional  . H/O measles   . History of chicken pox   . History of kidney stones   . Hypertension   . Neuromuscular disorder (HCC)    neuropathy feet   . Persistent atrial fibrillation ([place])   . Pneumonia 1992  . Presence of permanent cardiac pacemaker   . Tachycardia-bradycardia ([place])    a. s/p STJ dual chamber pacemaker  . Thiamine  deficiency 01/19/2018  . Valvular heart disease 07/01/2016    Past Surgical History:  Procedure Laterality Date  . CHOLECYSTECTOMY N/A 11/24/2015   Procedure: LAPAROSCOPIC CHOLECYSTECTOMY;  Surgeon: Greer Pickerel, MD;  Location: [facility];  Service: General;  Laterality: N/A;  . CYSTOSCOPY  2016  . EYE SURGERY     bil cataract  . FRACTURE SURGERY  2005   left ankle  . HERNIA REPAIR     left groin, no mesh  . PERMANENT PACEMAKER INSERTION N/A 01/31/2012   SJM Accent SR RF implanted by DR Allred  . TOTAL KNEE ARTHROPLASTY Right 12/23/2019   Procedure: TOTAL KNEE ARTHROPLASTY;  Surgeon: Gaynelle Arabian, MD;   Location: [facility];  Service: Orthopedics;  Laterality: Right;  76min     Current Outpatient Medications  Medication Sig Dispense Refill  . Biotin 5000 MCG TABS Take 5,000 mg by mouth daily.    . Calcium Carbonate-Vitamin D (CALTRATE 600+D PO) Take 600 mg by mouth daily.     . Cholecalciferol 50 MCG (2000 UT) TABS Take 2,000 Units by mouth daily.     . Cyanocobalamin (VITAMIN B-12) 5000 MCG TBDP Take by mouth daily.    . diclofenac Sodium (VOLTAREN) 1 % GEL Apply topically daily as needed (Pain).     Marland Kitchen diltiazem (CARDIZEM CD) 240 MG 24 hr capsule TAKE ONE (1) CAPSULE EACH DAY BY MOUTH 90 capsule 3  . gabapentin (NEURONTIN) 300 MG capsule Take 1 capsule by mouth twice daily 180 capsule 1  . irbesartan-hydrochlorothiazide (AVALIDE) 150-12.5 MG tablet Take 1/2 (one-half) tablet by mouth once daily 45 tablet 1  . magnesium oxide (MAG-OX) 400 MG tablet Take 400 mg by mouth daily.    . metoprolol tartrate (LOPRESSOR) 100 MG tablet Take 1 tablet by mouth twice daily 180 tablet 1  . rivaroxaban (XARELTO) 20 MG TABS tablet Take 1 tablet (20 mg total) by mouth daily. 90 tablet 1  . thiamine 100 MG tablet Take 100 mg by mouth daily.     . Turmeric 500 MG TABS Take 500 mg by mouth daily.      No current facility-administered medications for this visit.    Allergies:   Cardizem [diltiazem] and Sulfa antibiotics    Social History:  The patient  reports that he quit smoking about 34 years ago. His smoking use included pipe and cigars. He quit after 10.00 years of use. He has never used smokeless tobacco. He reports current alcohol use of about 3.0 standard drinks of alcohol per week. He reports that he does not use drugs.   Family History:  The patient's family history includes Arthritis in his mother; Atrial fibrillation in his brother and sister; Drug abuse in his mother; Heart disease in his brother, father, and sister; Heart disease (age of onset: 49) in his sister; Hypertension in his brother,  mother, and son; Neuropathy in his brother; Stroke in his father.    ROS:  Please see the history of present illness.   Otherwise, review of systems are positive for .   All other systems are reviewed and negative.    PHYSICAL EXAM: VS:  BP 126/80   Pulse 66   Ht 6' (1.829 m)   Wt 219 lb (99.3 kg)   SpO2 98%   BMI 29.70 kg/m  , BMI Body mass index is 29.7 kg/m. GEN: Well nourished, well developed, in no acute distress  HEENT: normal  Neck: no JVD, carotid bruits, or masses Cardiac: irregularly irregular; no murmurs, rubs, or  gallops,no edema ; ;eft ankle brace Respiratory:  clear to auscultation bilaterally, normal work of breathing GI: soft, nontender, nondistended, + BS MS: no deformity or atrophy  Skin: warm and dry, no rash Neuro:  Strength and sensation are intact Psych: euthymic mood, full affect   Recent Labs: 05/26/2020: ALT 12; BUN 21; Creat 1.31; Hemoglobin 15.1; Platelets 157; Potassium 4.3; Sodium 140; TSH 2.33   Lipid Panel    Component Value Date/Time   CHOL 168 05/26/2020 1103   TRIG 110 05/26/2020 1103   HDL 40 05/26/2020 1103   CHOLHDL 4.2 05/26/2020 1103   VLDL 19.8 11/18/2019 0923   LDLCALC 107 (H) 05/26/2020 1103     Other studies Reviewed: Additional studies/ records that were reviewed today with results demonstrating: labs reviewed.   ASSESSMENT AND PLAN:  1. Dilated aortic root: Followed by CT surgery, Dr. Orvan Seen. 5.0 cm.  F/u scan ina few months per surgery.  We spoke about avoiding straining. 2. Carotid disease: Mild disease in 2020.  3. Mitral stenosis: moderate in 2020. No CHF sx. 4. Hyperlipidemia: LDL 107.  Healthy diet.  5. HTN: The current medical regimen is effective;  continue present plan and medications. Low salt diet. 6. Anticoagulated: No bleeding issues. Continue Xarelto.  7. Pacer: Follwed by EP.     Current medicines are reviewed at length with the patient today.  The patient concerns regarding his medicines were  addressed.  The following changes have been made:  No change  Labs/ tests ordered today include:  No orders of the defined types were placed in this encounter.   Recommend 150 minutes/week of aerobic exercise Low fat, low carb, high fiber diet recommended  Disposition:   FU in 6 months- after this will try to go to annual   Signed, Larae Grooms, MD  06/11/2020 4:40 PM    [facility] [address], [place], [place]  [address] Phone: [phone]; Fax: [phone]

## 2020-06-11 ENCOUNTER — Ambulatory Visit: Payer: PPO | Admitting: Interventional Cardiology

## 2020-06-11 ENCOUNTER — Encounter: Payer: Self-pay | Admitting: Interventional Cardiology

## 2020-06-11 ENCOUNTER — Other Ambulatory Visit: Payer: Self-pay

## 2020-06-11 VITALS — BP 126/80 | HR 66 | Ht 72.0 in | Wt 219.0 lb

## 2020-06-11 DIAGNOSIS — I059 Rheumatic mitral valve disease, unspecified: Secondary | ICD-10-CM | POA: Diagnosis not present

## 2020-06-11 DIAGNOSIS — Z7901 Long term (current) use of anticoagulants: Secondary | ICD-10-CM

## 2020-06-11 DIAGNOSIS — I77810 Thoracic aortic ectasia: Secondary | ICD-10-CM | POA: Diagnosis not present

## 2020-06-11 DIAGNOSIS — Z950 Presence of cardiac pacemaker: Secondary | ICD-10-CM

## 2020-06-11 DIAGNOSIS — I10 Essential (primary) hypertension: Secondary | ICD-10-CM | POA: Diagnosis not present

## 2020-06-11 DIAGNOSIS — I779 Disorder of arteries and arterioles, unspecified: Secondary | ICD-10-CM | POA: Diagnosis not present

## 2020-06-11 NOTE — Patient Instructions (Signed)

## 2020-06-17 ENCOUNTER — Telehealth: Payer: Self-pay | Admitting: *Deleted

## 2020-06-17 DIAGNOSIS — R972 Elevated prostate specific antigen [PSA]: Secondary | ICD-10-CM | POA: Diagnosis not present

## 2020-06-17 DIAGNOSIS — N402 Nodular prostate without lower urinary tract symptoms: Secondary | ICD-10-CM | POA: Diagnosis not present

## 2020-06-17 NOTE — Telephone Encounter (Signed)
   [facility] Pre-operative Risk Assessment    HEARTCARE STAFF: - Please ensure there is not already an duplicate clearance open for this procedure. - Under Visit Info/Reason for Call, type in Other and utilize the format Clearance MM/DD/YY or Clearance TBD. Do not use dashes or single digits. - If request is for dental extraction, please clarify the # of teeth to be extracted.  Request for surgical clearance:  1. What type of surgery is being performed? PROSTATE Bx   2. When is this surgery scheduled? 08/17/20   3. What type of clearance is required (medical clearance vs. Pharmacy clearance to hold med vs. Both)? BOTH  4. Are there any medications that need to be held prior to surgery and how long? XARELTO x 3 DAYS PRIOR TO PROCEDURE   5. Practice name and name of physician performing surgery? [facility]; DR. Junious Silk   6. What is the office phone number? [phone]   7.   What is the office fax number? [phone]  8.   Anesthesia type (None, local, MAC, general) ? LOCAL   Brian Davidson 06/17/2020, 11:46 AM  _________________________________________________________________   (provider comments below)

## 2020-06-17 NOTE — Telephone Encounter (Signed)
   Primary Cardiologist: Larae Grooms, MD  Chart reviewed as part of pre-operative protocol coverage. Given past medical history and time since last visit, based on ACC/AHA guidelines, Brian Davidson would be at acceptable risk for the planned procedure without further cardiovascular testing.   Patient with diagnosis of A Fib on Xarelto for anticoagulation.    Procedure: PROSTATE Bx Date of procedure: 08/17/20  CHADS2-VASc score of  4 (HTN, CAD, AGE x 2)  CrCl 61.06 mL/min       Platelet count 157K  Per office protocol, patient can hold Xarelto for 3 days prior to procedure.    Patient will not need bridging with Lovenox (enoxaparin) around procedure.  If not bridging, patient should restart Xarelto on the evening of procedure or day after, at discretion of procedure MD  I will route this recommendation to the requesting party via [place] fax function and remove from pre-op pool.  Please call with questions.  Jossie Ng. Ocean Schildt NP-C    06/17/2020, 1:25 PM [facility] [address] Office [phone] Fax [phone]

## 2020-06-17 NOTE — Telephone Encounter (Signed)
Patient with diagnosis of A Fib on Xarelto for anticoagulation.    Procedure: PROSTATE Bx  Date of procedure: 08/17/20  CHADS2-VASc score of  4 (HTN, CAD, AGE x 2)  CrCl 61.06 mL/min  Platelet count 157K  Per office protocol, patient can hold Xarelto for 3 days prior to procedure.    Patient will not need bridging with Lovenox (enoxaparin) around procedure.  If not bridging, patient should restart Xarelto on the evening of procedure or day after, at discretion of procedure MD

## 2020-06-24 ENCOUNTER — Telehealth: Payer: Self-pay | Admitting: Interventional Cardiology

## 2020-06-24 NOTE — Telephone Encounter (Signed)
Pt c/o medication issue:  1. Name of Medication: irbesartan-hydrochlorothiazide (AVALIDE) 150-12.5 MG tablet  2. How are you currently taking this medication (dosage and times per day)? 1/2 a tablet by mouth daily   3. Are you having a reaction (difficulty breathing--STAT)? No   4. What is your medication issue? Clavin is calling stating he saw something online yesterday that advised patient's to stop taking this medication due to an ingredient in causing cancer. He is requesting a nurse call him to discuss this further. Please advise.

## 2020-06-24 NOTE — Telephone Encounter (Signed)
Yes, [employer] recalled many lots of irbesartan/hctz.  However these meds were last distributed to pharmacies in December 2020.  I recommend patient check with their pharmacy to see which manufacturer and lot number they received.

## 2020-06-24 NOTE — Telephone Encounter (Signed)
Returned the patients call and made him aware of the following recommendations from PharmD. Pt verbalized understanding and thanked me for the call.    Pavero, Harrell Gave, RPH 9 minutes ago (9:58 AM)     Yes, [employer] recalled many lots of irbesartan/hctz.  However these meds were last distributed to pharmacies in December 2020.  I recommend patient check with their pharmacy to see which manufacturer and lot number they received.

## 2020-07-16 DIAGNOSIS — M79672 Pain in left foot: Secondary | ICD-10-CM | POA: Diagnosis not present

## 2020-07-16 DIAGNOSIS — M79671 Pain in right foot: Secondary | ICD-10-CM | POA: Diagnosis not present

## 2020-07-16 DIAGNOSIS — L84 Corns and callosities: Secondary | ICD-10-CM | POA: Diagnosis not present

## 2020-07-19 ENCOUNTER — Other Ambulatory Visit: Payer: Self-pay | Admitting: Family Medicine

## 2020-07-21 NOTE — Chronic Care Management (AMB) (Signed)
Chronic Care Management Pharmacy  Name: Brian Davidson  MRN: [id number] DOB: Jan 07, 1936   Chief Complaint/ HPI  Brian Davidson,  84 y.o. , male presents for their Follow-Up CCM visit with the clinical pharmacist via telephone due to COVID-19 Pandemic.  PCP : Mosie Lukes, MD  Their chronic conditions include: Hypertension, Hyperlipidemia/CAD, AFib, Osteopenia, Neuropathy/Pain  Office Visits: 05/26/20: Visit w/ Dr. Charlett Blake -  No med changes noted.  Consult Visit: 06/11/20: [place] visit w/ Dr. Irish Lack - No med changes noted. RTC 6 months.   05/27/20: Cardio visit w/ Dr. Rayann Heman - No med changes noted. RTC 1 year.   Medications: Outpatient Encounter Medications as of 07/22/2020  Medication Sig  . Biotin 5000 MCG TABS Take 5,000 mg by mouth daily.  . Calcium Carbonate-Vitamin D (CALTRATE 600+D PO) Take 600 mg by mouth daily.   . Cholecalciferol 50 MCG (2000 UT) TABS Take 2,000 Units by mouth daily.   . Cyanocobalamin (VITAMIN B-12) 5000 MCG TBDP Take by mouth daily.  . diclofenac Sodium (VOLTAREN) 1 % GEL Apply topically daily as needed (Pain).   Marland Kitchen diltiazem (CARDIZEM CD) 240 MG 24 hr capsule TAKE ONE (1) CAPSULE EACH [place] BY MOUTH  . gabapentin (NEURONTIN) 300 MG capsule Take 1 capsule by mouth twice daily  . irbesartan-hydrochlorothiazide (AVALIDE) 150-12.5 MG tablet Take 0.5 tablets by mouth daily.  . magnesium oxide (MAG-OX) 400 MG tablet Take 400 mg by mouth daily.  . metoprolol tartrate (LOPRESSOR) 100 MG tablet Take 1 tablet by mouth twice daily  . rivaroxaban (XARELTO) 20 MG TABS tablet Take 1 tablet (20 mg total) by mouth daily.  Marland Kitchen thiamine 100 MG tablet Take 100 mg by mouth daily.   . Turmeric 500 MG TABS Take 500 mg by mouth daily.    No facility-administered encounter medications on file as of 07/22/2020.   SDOH Screenings   Alcohol Screen:   . Last Alcohol Screening Score (AUDIT): Not on file  Depression (PHQ2-9): Low Risk   . PHQ-2 Score: 0  Financial  Resource Strain: Low Risk   . Difficulty of Paying Living Expenses: Not hard at all  Food Insecurity: No Food Insecurity  . Worried About Charity fundraiser in the Last Year: Never true  . Ran Out of Food in the Last Year: Never true  Housing: Low Risk   . Last Housing Risk Score: 0  Physical Activity: Sufficiently Active  . Days of Exercise per Week: 7 days  . Minutes of Exercise per Session: 30 min  Social Connections:   . Frequency of Communication with Friends and Family: Not on file  . Frequency of Social Gatherings with Friends and Family: Not on file  . Attends Religious Services: Not on file  . Active Member of Clubs or Organizations: Not on file  . Attends Archivist Meetings: Not on file  . Marital Status: Not on file  Stress:   . Feeling of Stress : Not on file  Tobacco Use: Medium Risk  . Smoking Tobacco Use: Former Smoker  . Smokeless Tobacco Use: Never Used  Transportation Needs: No Transportation Needs  . Lack of Transportation (Medical): No  . Lack of Transportation (Non-Medical): No     Current Diagnosis/Assessment:  Goals Addressed            This Visit's Progress   . Chronic Care Management Pharmacy Care Plan       CARE PLAN ENTRY (see longitudinal plan of care for additional care plan  information)  Current Barriers:  . Chronic Disease Management support, education, and care coordination needs related to Hypertension, Hyperlipidemia/CAD, AFib, Osteopenia, Neuropathy/Pain   Hypertension BP Readings from Last 3 Encounters:  06/11/20 126/80  06/01/20 135/69  05/27/20 (!) 142/72   . Pharmacist Clinical Goal(s): o Over the next 90 days, patient will work with PharmD and providers to achieve BP goal <140/90 . Current regimen:  . Irebesartan/hctz 150/12.13m 1/2 tab daily EM . Metoprolol tartrate 1038mtwice daily . Interventions: o Requested patient to check blood pressure 2-3 times per week and record . Patient self care activities - Over  the next 90 days, patient will: o Check BP 2-3 times per week, document, and provide at future appointments o Ensure daily salt intake < 2300 mg/Sankalp Ferrell  Hyperlipidemia Lab Results  Component Value Date/Time   LDLCALC 107 (H) 05/26/2020 11:03 AM   . Pharmacist Clinical Goal(s): o Over the next 90 days, patient will work with PharmD and providers to achieve LDL goal < 70 . Current regimen:  o Diet and exercise management   . Interventions: o Discussed LDL goal . Patient self care activities - Over the next 90 days, patient will: o Improve Diet and exercise management   Osteopenia . Pharmacist Clinical Goal(s) o Over the next 90 days, patient will work with PharmD and providers to reduce risk of fracture due to osteopenia . Current regimen:  . Calcium 60034maily . Vitamin D 2000 units daily  . Interventions: o Consider repeat DEXA Scan . Patient self care activities - Over the next 90 days, patient will: o Maintain osteopenia medication regimen   Medication management . Pharmacist Clinical Goal(s): o Over the next 90 days, patient will work with PharmD and providers to maintain optimal medication adherence . Current pharmacy: [facility] . Interventions o Comprehensive medication review performed. o Continue current medication management strategy . 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  Hypertension   BP goal is:  <140/90  Office blood pressures are  BP Readings from Last 3 Encounters:  06/11/20 126/80  06/01/20 135/69  05/27/20 (!) 142/72   Patient checks  BP at home when feeling symptomatic Patient home BP readings are ranging: Unable to assess  Patient has failed these meds in the past: None noted  Patient is currently controlled on the following medications:  . Irebesartan/hctz 150/12.5mg48m2 tab daily EM . Metoprolol tartrate 100mg76mce daily  Diet: Lots of veggies. Wife cooks for him.   Exercise: Walks 2-2.5 miles per Lidia Clavijo.  We discussed BP goal   Update 07/22/20 No specific readings to provide Highest 150s Lowest 120s  Denies headaches, chest pains, and dizziness  Plan -Continue current medications  -Check blood pressure 2-3 times per week and record    Hyperlipidemia/CAD   LDL goal <70  Lipid Panel     Component Value Date/Time   CHOL 168 05/26/2020 1103   TRIG 110 05/26/2020 1103   HDL 40 05/26/2020 1103   LDLCALC 107 (H) 05/26/2020 1103    Hepatic Function Latest Ref Rng & Units 05/26/2020  12/16/2019 11/18/2019  Total Protein 6.1 - 8.1 g/dL 7.3 7.0 6.7  Albumin 3.5 - 5.0 g/dL - 3.9 3.8  AST 10 - 35 U/L 18 21 17   ALT 9 - 46 U/L 12 17 13   Alk Phosphatase 38 - 126 U/L - 64 72  Total Bilirubin 0.2 - 1.2 mg/dL 1.0 0.7 1.0  Bilirubin, Direct 0.0 - 0.3 mg/dL - - -     The ASCVD Risk score ([place]., et al., 2013) failed to calculate for the following reasons:   The 2013 ASCVD risk score is only valid for ages 36 to 60   Patient has failed these meds in past: None noted  Patient is currently uncontrolled on the following medications:  . None  We discussed:  Indication for statin therapy noting his LDL and hx of CAD. Pt not agreeable to therapy today. Will continue discussion   Update 07/22/20 Will see how next lipid panel is and continue discussion  Plan -Continue current medications    Osteopenia   Last DEXA Scan: 01/25/2018  T-Score femoral neck: -1.6 (R)  T-Score lumbar spine: 0.6  10-year probability of major osteoporotic fracture: 18.6%  10-year probability of hip fracture: 13.4%  No results  found for: VD25OH   Patient is a candidate for pharmacologic treatment due to T-Score -1.0 to -2.5 and 10-year risk of hip fracture > 3%  Patient has failed these meds in past: None noted  Patient is currently uncontrolled on the following medications:  . Calcium 622m daily . Vitamin D 2000 units daily  Pt is aware his hip fracture risk is high. Discussed that he is indicated for treatment due to this.  Hesitant to start. Will continue discussion.  Update 07/22/20 Pt is still unsure about this  Plan -Consider repeat DEXA Scan  -Continue current medications   Medication Management   [facility] U[facility] pill box? Yes Pt endorses 99% compliance  We discussed: Offered [facility], but patient is very specific about NDCs he needs for his diltiazem so he doesn't want to change pharmacies.  Plan -Continue current medication management strategy  Follow up: 3 month phone visit  Miscellaneous Meds Feels all of his supplements help him Magnesium 4039mdaily AM Turmeric 50054maily AM (hand/joint pain) Vitamin B12 5000 daily AM (wife said he should take this) Vitamin B1 1000m27mily AM (from Dr. BlytCharlett BlakeKaneMelvenia Beam, PharmD Clinical Pharmacist [phone]

## 2020-07-22 ENCOUNTER — Ambulatory Visit: Payer: PPO | Admitting: Pharmacist

## 2020-07-22 DIAGNOSIS — M8580 Other specified disorders of bone density and structure, unspecified site: Secondary | ICD-10-CM

## 2020-07-22 DIAGNOSIS — E785 Hyperlipidemia, unspecified: Secondary | ICD-10-CM

## 2020-07-22 DIAGNOSIS — I10 Essential (primary) hypertension: Secondary | ICD-10-CM

## 2020-07-22 NOTE — Patient Instructions (Addendum)
Visit Information  Goals Addressed            This Visit's Progress   . Chronic Care Management Pharmacy Care Plan       CARE PLAN ENTRY (see longitudinal plan of care for additional care plan information)  Current Barriers:  . Chronic Disease Management support, education, and care coordination needs related to Hypertension, Hyperlipidemia/CAD, AFib, Osteopenia, Neuropathy/Pain   Hypertension BP Readings from Last 3 Encounters:  06/11/20 126/80  06/01/20 135/69  05/27/20 (!) 142/72   . Pharmacist Clinical Goal(s): o Over the next 90 days, patient will work with PharmD and providers to achieve BP goal <140/90 . Current regimen:  . Irebesartan/hctz 150/12.5mg  1/2 tab daily EM . Metoprolol tartrate 100mg  twice daily . Interventions: o Requested patient to check blood pressure 2-3 times per week and record . Patient self care activities - Over the next 90 days, patient will: o Check BP 2-3 times per week, document, and provide at future appointments o Ensure daily salt intake < 2300 mg/Essense Bousquet  Hyperlipidemia Lab Results  Component Value Date/Time   LDLCALC 107 (H) 05/26/2020 11:03 AM   . Pharmacist Clinical Goal(s): o Over the next 90 days, patient will work with PharmD and providers to achieve LDL goal < 70 . Current regimen:  o Diet and exercise management   . Interventions: o Discussed LDL goal . Patient self care activities - Over the next 90 days, patient will: o Improve Diet and exercise management   Osteopenia . Pharmacist Clinical Goal(s) o Over the next 90 days, patient will work with PharmD and providers to reduce risk of fracture due to osteopenia . Current regimen:  . Calcium 600mg  daily . Vitamin D 2000 units daily  . Interventions: o Consider repeat DEXA Scan . Patient self care activities - Over the next 90 days, patient will: o Maintain osteopenia medication regimen   Medication management . Pharmacist Clinical Goal(s): o Over the next 90 days,  patient will work with PharmD and providers to maintain optimal medication adherence . Current pharmacy: [facility] . Interventions o Comprehensive medication review performed. o Continue current medication management strategy . Patient self care activities - Over the next 90 days, patient will: o Focus on medication adherence by filling and taking medications appropriately  o Take medications as prescribed o Report any questions or concerns to PharmD and/or provider(s)  Please see past updates related to this goal by clicking on the "Past Updates" button in the selected goal         The patient verbalized understanding of instructions, educational materials, and care plan provided today .  Telephone follow up appointment with pharmacy team member scheduled for: 01/19/2021  Melvenia Beam Rainah Kirshner, PharmD Clinical Pharmacist [facility] [phone]

## 2020-08-03 ENCOUNTER — Telehealth: Payer: Self-pay

## 2020-08-03 NOTE — Telephone Encounter (Signed)
Patient scheduled to see Mackie Pai PA-C on 08/04/20.  Attempted to contact patient, no answer.   [facility] [place] Day - Client TELEPHONE ADVICE RECORD Access[facility]urse Patient Name: Brian Davidson Gender: Male DOB: 12/17/1935 Age: 84 Y 10 M 18 D Return Phone Number: [phone] (Primary) Address: City/State/Zip: [place] [address] Client [facility] [place] [facility] - Client Client Site [facility] - Day Physician [name] - MD Contact Type Call Who Is Calling Patient / Member / Family / Caregiver Call Type Triage / Clinical Relationship To Patient Self Return Phone Number [phone] (Primary) Chief Complaint CHEST PAIN (>=21 years) - pain, pressure, heaviness or tightness Reason for Call Symptomatic / Request for [place] states he's complaining of dull pain on his chest. [place] Not Listed [facility] ER Translation No Nurse Assessment Nurse: Rock Nephew, RN, Juliann Pulse Date/Time (Eastern Time): 08/03/2020 9:03:17 AM Confirm and document reason for call. If symptomatic, describe symptoms. ---Caller states he's complaining of dull pain on his chest, it intensifies with movement but has been present off and on for about 3 days . Does the patient have any new or worsening symptoms? ---Yes Will a triage be completed? ---Yes Related visit to physician within the last 2 weeks? ---No Does the PT have any chronic conditions? (i.e. diabetes, asthma, this includes High risk factors for pregnancy, etc.) ---Yes List chronic conditions. ---HTN , AFIB Is this a behavioral health or substance abuse call? ---No Guidelines Guideline Title Affirmed Question Affirmed Notes Nurse Date/Time Eilene Ghazi Time) Chest Pain SEVERE chest pain Rock Nephew, RN, Juliann Pulse 08/03/2020 9:04:34 AM Disp. Time Eilene Ghazi Time) Disposition Final User 08/03/2020 8:55:40 AM Send to Urgent Antony Haste 08/03/2020 8:59:17 AM Attempt made -  message left Rock Nephew, RN, Juliann Pulse 08/03/2020 9:06:59 AM Go to ED Now Yes Rock Nephew, RN, Juliann Pulse PLEASE NOTE: All timestamps contained within this report are represented as [place] Standard Time. CONFIDENTIALTY NOTICE: This fax transmission is intended only for the addressee. It contains information that is legally privileged, confidential or otherwise protected from use or disclosure. If you are not the intended recipient, you are strictly prohibited from reviewing, disclosing, copying using or disseminating any of this information or taking any action in reliance on or regarding this information. If you have received this fax in error, please notify us immediately by telephone so that we can arrange for its return to [place]. Phone: [phone], Toll-Free: [phone], Fax: [phone] Page: 2 of 2 Call Id: [id number] [place] Disagree/Comply Comply Caller Understands Yes PreDisposition Call Doctor Care Advice Given Per Guideline GO TO ED NOW: * You need to be seen in the Emergency Department. * Another adult should drive. * Bring a list of your current medicines when you go to the Emergency Department (ER). NOTHING BY MOUTH: * Do not eat or drink anything for now. CARE ADVICE given per Chest Pain (Adult) guideline. Comments User: Baruch Goldmann Date/Time Eilene Ghazi Time): 08/03/2020 8:54:57 AM Caller transferred from the office. Referrals GO TO FACILITY OTHER - SPECIFY

## 2020-08-04 ENCOUNTER — Ambulatory Visit (INDEPENDENT_AMBULATORY_CARE_PROVIDER_SITE_OTHER): Payer: PPO | Admitting: Medical

## 2020-08-04 ENCOUNTER — Other Ambulatory Visit: Payer: Self-pay

## 2020-08-04 ENCOUNTER — Ambulatory Visit (HOSPITAL_BASED_OUTPATIENT_CLINIC_OR_DEPARTMENT_OTHER)
Admission: RE | Admit: 2020-08-04 | Discharge: 2020-08-04 | Disposition: A | Discharge status: Home / Self Care | Payer: PPO | Source: Ambulatory Visit | Attending: Medical | Admitting: Medical

## 2020-08-04 ENCOUNTER — Encounter: Payer: Self-pay | Admitting: Medical

## 2020-08-04 VITALS — BP 122/48 | HR 57 | Resp 20 | Ht 72.0 in | Wt 220.8 lb

## 2020-08-04 DIAGNOSIS — I4821 Permanent atrial fibrillation: Secondary | ICD-10-CM | POA: Diagnosis not present

## 2020-08-04 DIAGNOSIS — R079 Chest pain, unspecified: Secondary | ICD-10-CM | POA: Diagnosis not present

## 2020-08-04 DIAGNOSIS — I10 Essential (primary) hypertension: Secondary | ICD-10-CM

## 2020-08-04 DIAGNOSIS — M940 Chondrocostal junction syndrome [Tietze]: Secondary | ICD-10-CM

## 2020-08-04 LAB — TROPONIN I (HIGH SENSITIVITY): High Sens Troponin I: 11 ng/L (ref 2–17)

## 2020-08-04 MED ORDER — FLUTICASONE PROPIONATE 50 MCG/ACT NA SUSP: 2.0000 | Freq: Every day | NASAL | 1 refills | Status: DC

## 2020-08-04 MED ORDER — METHYLPREDNISOLONE 4 MG PO TABS: ORAL_TABLET | ORAL | 0 refills | Status: DC

## 2020-08-04 NOTE — Progress Notes (Signed)
Subjective:    Patient ID: Brian Davidson, male    DOB: 03-05-1936, 84 y.o.   MRN: [id number]  HPI   Pt in for some rt upper chest raw sensation/cold sensation. He states maybe from lung. No pain on deep inspiration. No pain on palpation.   Low level mild dull pain. Not sharp. Does not cause shortness of breath. No mid or left side pain. No nausea, no jaw pain, no shoulder pain or any arm pain. No sweating.   Pt has hx of atrial fibrillation and hx of bradycardia. Has pacemaker . Pt is on xarelto.  Pt states he walked 1.5 miles yesterday.  Pt called today for appointment. He was triaged and based on complaint advised to go to ED. He refused. So I saw him in office.  Pt states pain when twists thorax but no pain on deep inspiration.   Review of Systems  Constitutional: Negative for chills, fatigue and fever.  Respiratory: Negative for cough, chest tightness and wheezing.   Cardiovascular: Positive for chest pain. Negative for palpitations.  Gastrointestinal: Negative for abdominal pain.  Genitourinary: Negative for dysuria.  Musculoskeletal: Negative for back pain, myalgias and neck stiffness.       Atypical rt side chest pain. Appears to be muscle vs cartidge.   Skin: Negative for rash.  Neurological: Negative for syncope, facial asymmetry, weakness and headaches.  Hematological: Negative for adenopathy. Does not bruise/bleed easily.  Psychiatric/Behavioral: Negative for behavioral problems, confusion, dysphoric mood, self-injury and suicidal ideas.   Past Medical History:  Diagnosis Date   Arthritis    Biliary dyskinesia    Carotid artery disease ([place]) 07/01/2016   Dyslipidemia 07/13/2017   Dysrhythmia    a fib   GERD (gastroesophageal reflux disease)    occasional   H/O measles    History of chicken pox    History of kidney stones    Hypertension    Neuromuscular disorder (HCC)    neuropathy feet    Persistent atrial fibrillation (HCC)    Pneumonia  1992   Presence of permanent cardiac pacemaker    Tachycardia-bradycardia ([place])    a. s/p STJ dual chamber pacemaker   Thiamine deficiency 01/19/2018   Valvular heart disease 07/01/2016     Social History   Socioeconomic History   Marital status: Married    Spouse name: [name]   Number of children: 1   Years of education: Not on file   Highest education level: Not on file  Occupational History   Occupation: Retired   Occupation: Dance movement psychotherapist for car [employer]  Tobacco Use   Smoking status: Former Smoker    Years: 10.00    Types: Pipe, Cigars    Quit date: 11/18/1985    Years since quitting: 34.7   Smokeless tobacco: Never Used  Vaping Use   Vaping Use: Never used  Substance and Sexual Activity   Alcohol use: Yes    Alcohol/week: 3.0 standard drinks    Types: 3 Cans of beer per week    Comment: occ   Drug use: No   Sexual activity: Not Currently  Other Topics Concern   Not on file  Social History Narrative   Lives with wife, still works part-time as a Geophysicist/field seismologist. He is active around the house and yard...   Parents are deceased and did not have cardiac issues but he has 2 siblings with atrial fibrillation and a sister-in-law  has a pacemaker.   Social Determinants of Radio broadcast assistant  Strain: Low Risk    Difficulty of Paying Living Expenses: Not hard at all  Food Insecurity: No Food Insecurity   Worried About Charity fundraiser in the Last Year: Never true   Ran Out of Food in the Last Year: Never true  Transportation Needs: No Transportation Needs   Lack of Transportation (Medical): No   Lack of Transportation (Non-Medical): No  Physical Activity: Sufficiently Active   Days of Exercise per Week: 7 days   Minutes of Exercise per Session: 30 min  Stress:    Feeling of Stress : Not on file  Social Connections:    Frequency of Communication with Friends and Family: Not on file   Frequency of Social Gatherings with Friends and  Family: Not on file   Attends Religious Services: Not on file   Active Member of Clubs or Organizations: Not on file   Attends Archivist Meetings: Not on file   Marital Status: Not on file  Intimate Partner Violence:    Fear of Current or Ex-Partner: Not on file   Emotionally Abused: Not on file   Physically Abused: Not on file   Sexually Abused: Not on file    Past Surgical History:  Procedure Laterality Date   CHOLECYSTECTOMY N/A 11/24/2015   Procedure: LAPAROSCOPIC CHOLECYSTECTOMY;  Surgeon: Greer Pickerel, MD;  Location: [facility];  Service: General;  Laterality: N/A;   CYSTOSCOPY  2016   EYE SURGERY     bil cataract   FRACTURE SURGERY  2005   left ankle   HERNIA REPAIR     left groin, no mesh   PERMANENT PACEMAKER INSERTION N/A 01/31/2012   SJM Accent SR RF implanted by DR Allred   TOTAL KNEE ARTHROPLASTY Right 12/23/2019   Procedure: TOTAL KNEE ARTHROPLASTY;  Surgeon: Gaynelle Arabian, MD;  Location: [facility];  Service: Orthopedics;  Laterality: Right;  67min    Family History  Problem Relation Age of Onset   Hypertension Mother    Arthritis Mother    Drug abuse Mother    Stroke Father    Heart disease Father    Heart disease Sister 30   Heart disease Brother    Hypertension Son    Heart disease Sister    Neuropathy Brother    Hypertension Brother    Atrial fibrillation Brother    Atrial fibrillation Sister    Colon cancer Neg Hx     Allergies  Allergen Reactions   Cardizem [Diltiazem]     Rash from Yellow dye in generic capsule. Can take different brand. Takes diltiazem - his allergy is to Cardizem   Sulfa Antibiotics     Unknown childhood reaction    Current Outpatient Medications on File Prior to Visit  Medication Sig Dispense Refill   Biotin 5000 MCG TABS Take 5,000 mg by mouth daily.     Calcium Carbonate-Vitamin D (CALTRATE 600+D PO) Take 600 mg by mouth daily.      Cholecalciferol 50 MCG (2000 UT) TABS Take  2,000 Units by mouth daily.      Cyanocobalamin (VITAMIN B-12) 5000 MCG TBDP Take by mouth daily.     diclofenac Sodium (VOLTAREN) 1 % GEL Apply topically daily as needed (Pain).      diltiazem (CARDIZEM CD) 240 MG 24 hr capsule TAKE ONE (1) CAPSULE EACH DAY BY MOUTH 90 capsule 3   gabapentin (NEURONTIN) 300 MG capsule Take 1 capsule by mouth twice daily 180 capsule 1   irbesartan-hydrochlorothiazide (AVALIDE) 150-12.5 MG tablet Take  0.5 tablets by mouth daily. 45 tablet 1   magnesium oxide (MAG-OX) 400 MG tablet Take 400 mg by mouth daily.     metoprolol tartrate (LOPRESSOR) 100 MG tablet Take 1 tablet by mouth twice daily 180 tablet 1   rivaroxaban (XARELTO) 20 MG TABS tablet Take 1 tablet (20 mg total) by mouth daily. 90 tablet 1   thiamine 100 MG tablet Take 100 mg by mouth daily.      Turmeric 500 MG TABS Take 500 mg by mouth daily.      No current facility-administered medications on file prior to visit.    BP (!) 122/48    Pulse (!) 57    Resp 20    Ht 6' (1.829 m)    Wt 220 lb 12.8 oz (100.2 kg)    SpO2 98%    BMI 29.95 kg/m        Objective:   Physical Exam  General- No acute distress. Pleasant patient. Neck- Full range of motion, no jvd Lungs- Clear, even and unlabored. Heart- regular rate and rhythm. Neurologic- CNII- XII grossly intact. Anterior thorax-  Rt sides costochandral junction and outer upper pectoralis reproducible pain on palpation.  Lower- no pedal edema bilaterally. Negative homans signs.      Assessment & Plan:  You do have recent 4 days of abdominal right-sided chest area pain.  On exam pain is reproducible on palpation and twisting the thorax increases pain slightly.  I do think this represents probable muscle pain versus cartilage pain.  We will give 4-day taper dose of Medrol.  You do have history of atrial fibrillation and bradycardia.  Pacemaker present.  Your EKGs showed atrial fibrillation.  Very similar to prior EKG with no significant  changes.  Other EKG did show pacemaker. 2 EKGs done today.  For caution sake we will go ahead and get 1 set of troponin.  Did discuss with you that if your pain worsens or changes location then have to advise emergency department evaluation.  History of hypertension and blood pressure is now well controlled.  Continue current meds for BP and atrial fibrillation.  Follow-up in 7 to 10 days or as needed.  Mackie Pai, PA-C

## 2020-08-04 NOTE — Patient Instructions (Addendum)
You do have recent 4 days of abdominal right-sided chest area pain.  On exam pain is reproducible on palpation and twisting the thorax increases pain slightly.  I do think this represents probable muscle pain versus cartilage pain.  We will give 4-day taper dose of Medrol.  You do have history of atrial fibrillation and bradycardia.  Pacemaker present.  Your EKGs showed atrial fibrillation/rate controlled.  Very similar to prior EKG with no significant changes.  Other EKG did show pacemaker. 2 EKGs done today.  For caution sake we will go ahead and get 1 set of troponin.  Did discuss with you that if your pain worsens or changes location then have to advise emergency department evaluation.  History of hypertension and blood pressure is now well controlled.  Continue current meds for BP and atrial fibrillation.  Also get chest x-ray today.  Follow-up in 7 to 10 days or as needed.

## 2020-08-17 DIAGNOSIS — M25562 Pain in left knee: Secondary | ICD-10-CM | POA: Diagnosis not present

## 2020-09-02 ENCOUNTER — Ambulatory Visit (INDEPENDENT_AMBULATORY_CARE_PROVIDER_SITE_OTHER): Payer: PPO

## 2020-09-02 DIAGNOSIS — I495 Sick sinus syndrome: Secondary | ICD-10-CM | POA: Diagnosis not present

## 2020-09-02 LAB — CUP PACEART REMOTE DEVICE CHECK
Battery Remaining Longevity: 126 mo
Battery Remaining Percentage: 91 %
Battery Voltage: 2.95 V
Brady Statistic RV Percent Paced: 13 %
Date Time Interrogation Session: 20211229020010
Implantable Lead Implant Date: 20130528
Implantable Lead Location: 753860
Implantable Lead Model: 1948
Implantable Pulse Generator Implant Date: 20130528
Lead Channel Impedance Value: 650 Ohm
Lead Channel Pacing Threshold Amplitude: 0.5 V
Lead Channel Pacing Threshold Pulse Width: 0.4 ms
Lead Channel Sensing Intrinsic Amplitude: 12 mV
Lead Channel Setting Pacing Amplitude: 2.5 V
Lead Channel Setting Pacing Pulse Width: 0.4 ms
Lead Channel Setting Sensing Sensitivity: 2 mV
Pulse Gen Model: 1210
Pulse Gen Serial Number: 7328888

## 2020-09-17 NOTE — Progress Notes (Signed)
Remote pacemaker transmission.   

## 2020-09-23 ENCOUNTER — Telehealth: Payer: Self-pay | Admitting: Pharmacist

## 2020-09-23 NOTE — Progress Notes (Addendum)
Chronic Care Management Pharmacy Assistant   Name: Brian Davidson  MRN: [id number] DOB: Mar 06, 1936  Reason for Encounter: [facility] Disease State  Patient Questions:  1.  Have you seen any other providers since your last visit? Yes  2.  Any changes in your medicines or health? Yes   PCP : Mosie Lukes, MD  Their chronic conditions include: Hypertension, Hyperlipidemia/CAD, AFib, Osteopenia, Neuropathy/Pain  Office Visits: 08-04-2020 (Fam Med) Patient presented in the office c/o right upper chest pain. When scheduling his appointment, pt was advised to be seen in the Ed due to hx of A-fib, Pacemaker, and Xarelto use. Patient refused. Medication changes: Flonase nasal spray. Medrol dose pack 4 mg.   Consults: 08-17-2020 (Ortho) OV for pain of the left knee joint. No medication changes  Allergies:   Allergies  Allergen Reactions   Cardizem [Diltiazem]     Rash from Yellow dye in generic capsule.   Can take different brand. Takes diltiazem - his allergy is to Cardizem   Sulfa Antibiotics     Unknown childhood reaction    Medications: Outpatient Encounter Medications as of 09/23/2020  Medication Sig   Biotin 5000 MCG TABS Take 5,000 mg by mouth daily.   Calcium Carbonate-Vitamin D (CALTRATE 600+D PO) Take 600 mg by mouth daily.    Cholecalciferol 50 MCG (2000 UT) TABS Take 2,000 Units by mouth daily.    Cyanocobalamin (VITAMIN B-12) 5000 MCG TBDP Take by mouth daily.   diclofenac Sodium (VOLTAREN) 1 % GEL Apply topically daily as needed (Pain).    diltiazem (CARDIZEM CD) 240 MG 24 hr capsule TAKE ONE (1) CAPSULE EACH DAY BY MOUTH   fluticasone (FLONASE) 50 MCG/ACT nasal spray Place 2 sprays into both nostrils daily.   gabapentin (NEURONTIN) 300 MG capsule Take 1 capsule by mouth twice daily   irbesartan-hydrochlorothiazide (AVALIDE) 150-12.5 MG tablet Take 0.5 tablets by mouth daily.   magnesium oxide (MAG-OX) 400 MG tablet Take 400 mg by mouth daily.   methylPREDNISolone  (MEDROL) 4 MG tablet 4 tab po day 1, 3 tab po day 2, 2 tab po day 3, 1 tab po day 4   metoprolol tartrate (LOPRESSOR) 100 MG tablet Take 1 tablet by mouth twice daily   rivaroxaban (XARELTO) 20 MG TABS tablet Take 1 tablet (20 mg total) by mouth daily.   thiamine 100 MG tablet Take 100 mg by mouth daily.    Turmeric 500 MG TABS Take 500 mg by mouth daily.    No facility-administered encounter medications on file as of 09/23/2020.    Current Diagnosis: Patient Active Problem List   Diagnosis Date Noted   Thoracic aortic aneurysm ([place]) 05/27/2020   Primary osteoarthritis of right knee 12/23/2019   Symptomatic bradycardia 05/31/2019   Vertigo 07/27/2018   Multiple closed fractures of ribs of right side 01/21/2018   Thiamine deficiency 01/19/2018   Peripheral neuropathy 07/13/2017   Dyslipidemia 07/13/2017   Preventative health care 01/11/2017   Carotid artery disease ([place]) 07/01/2016   Mitral valve disorder 07/01/2016   Low back pain 12/31/2015   Osteopenia 12/31/2015   Allergic state 12/31/2015   Skin lesion 12/31/2015   History of chicken pox    Calculi, ureter 03/30/2015   OA (osteoarthritis) of knee 05/10/2014   Pacemaker-St.Jude 02/01/2012   Permanent atrial fibrillation ([place])    Hypertension    Heart murmur    Chronic anticoagulation     Goals Addressed   None    09/23/2020 Name: Brian Davidson  MRN: [id number] DOB: 02-03-1936 Brian Davidson is a 85 y.o. year old male who is a primary care patient of Mosie Lukes, MD.  Comprehensive medication review performed; Spoke to patient regarding cholesterol  Lipid Panel    Component Value Date/Time   CHOL 168 05/26/2020 1103   TRIG 110 05/26/2020 1103   HDL 40 05/26/2020 1103   LDLCALC 107 (H) 05/26/2020 1103    10-year ASCVD risk score: The ASCVD Risk score Mikey Bussing DC Jr., et al., 2013) failed to calculate for the following reasons:   The 2013 ASCVD risk score is only valid for ages 3 to 42  Current  antihyperlipidemic regimen:  Diet and exercise management     Previous antihyperlipidemic medications tried: none noted  ASCVD risk enhancing conditions: age >76 and HTN   What recent interventions/DTPs have been made by any provider to improve Cholesterol control since last CPP Visit: None  Any recent hospitalizations or ED visits since last visit with CPP? No   What diet changes have been made to improve Cholesterol?  Patient stated he eats mainly home cooked meals prepared by his wife. They both eat vegetables daily.  What exercise is being done to improve Cholesterol?  Patient walks daily. Has not the past week due to the weather.  Adherence Review: Does the patient have >5 day gap between last estimated fill dates? No   Follow-Up:  Pharmacist Review   Fanny Skates, [facility] Clinical Pharmacist Assistant  5 minutes spent in review, coordination, and documentation.  Reviewed by: Beverly Milch, PharmD Clinical Pharmacist [place] [phone]

## 2020-09-27 IMAGING — CT CT ANGIO CHEST
3 of 8 series · 17 of 46 positions shown · IV contrast (OMNIPAQUE 350)
Comparison: Chest radiograph October 27, 2017.

CLINICAL DATA: Thoracic aortic prominence

EXAM:
CT ANGIOGRAPHY CHEST WITH CONTRAST
TECHNIQUE: Multidetector CT imaging of the chest was performed using the
standard protocol during bolus administration of intravenous
contrast. Multiplanar CT image reconstructions and MIPs were
obtained to evaluate the vascular anatomy.
CONTRAST:  100mL OMNIPAQUE IOHEXOL 350 MG/ML SOLN

[Series 4: aorta 3.0 bf37 2 · axial · 0.74mm/px · z∈[-384,-86]mm · 12 of 119 slices shown]
[im 10/119  lung]
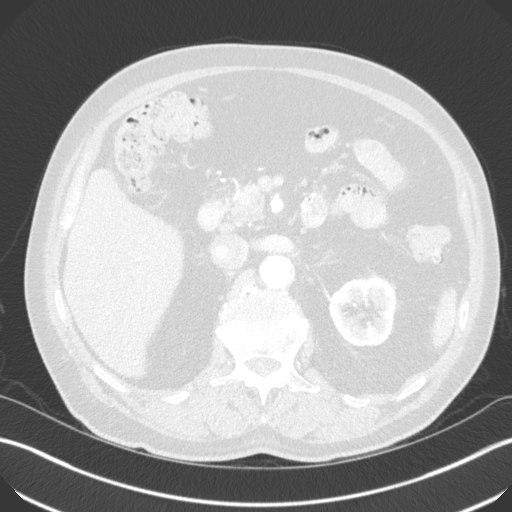
[im 19/119  soft-tissue]
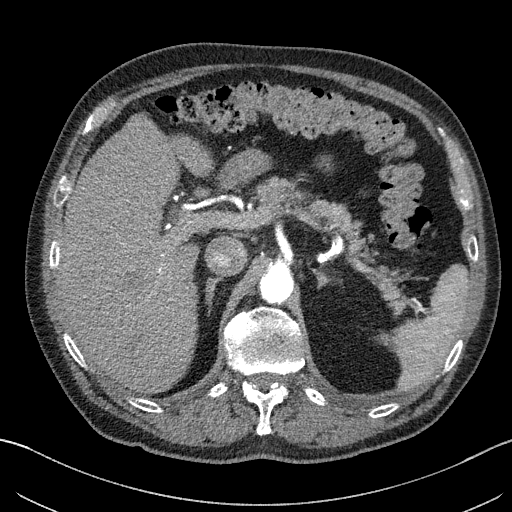
[im 28/119  lung]
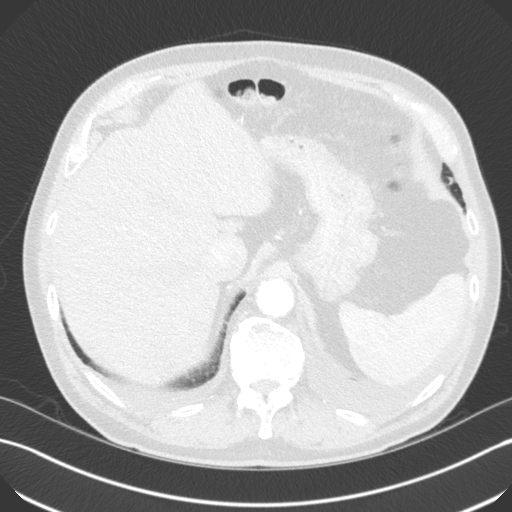
[im 37/119  soft-tissue]
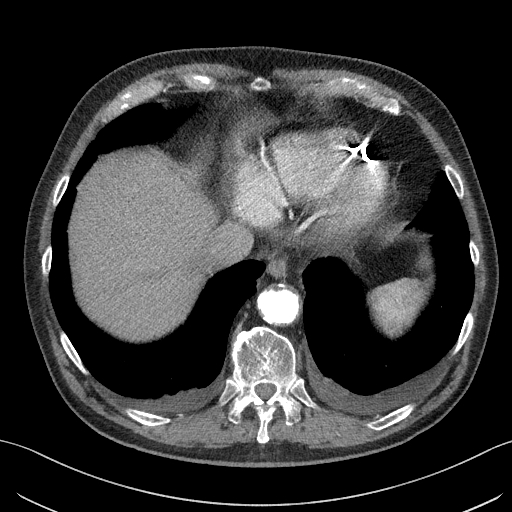
[im 46/119  lung]
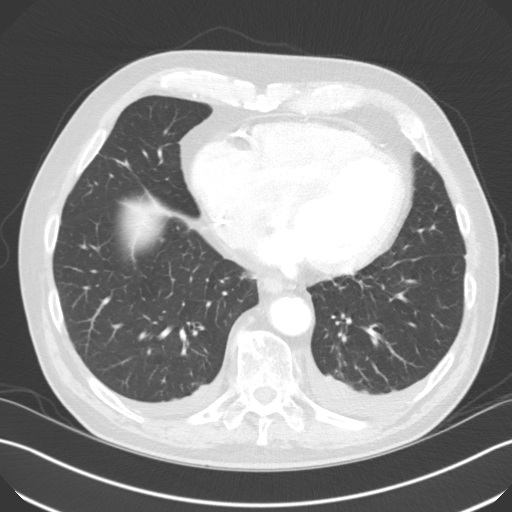
[im 55/119  soft-tissue]
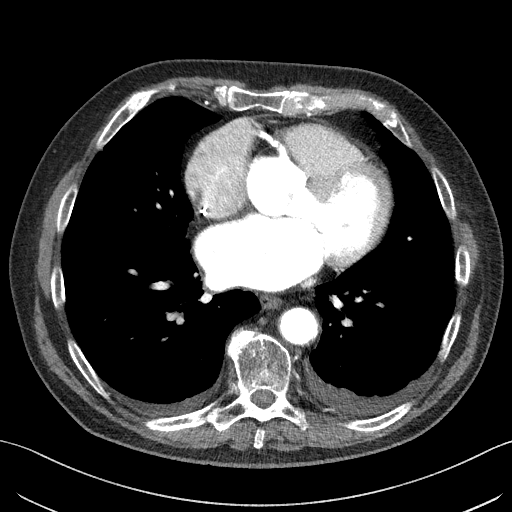
[im 64/119  lung]
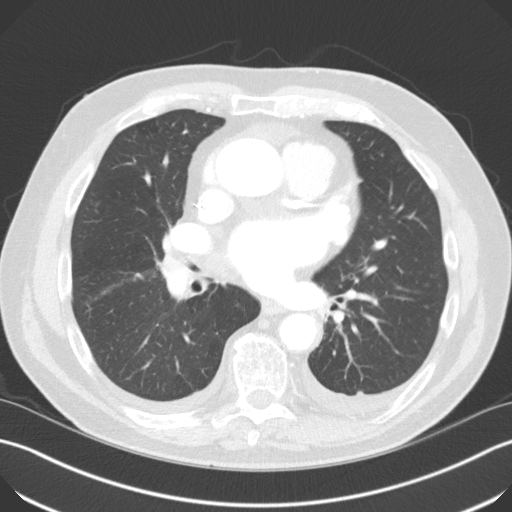
[im 73/119  soft-tissue]
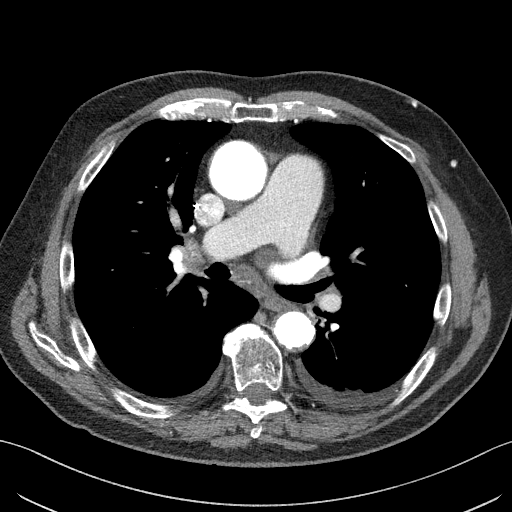
[im 82/119  lung]
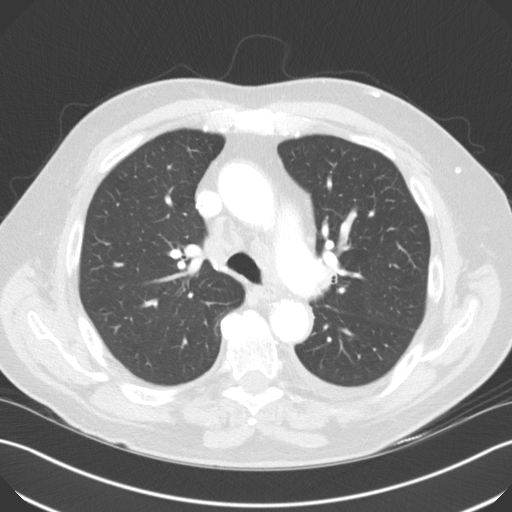
[im 91/119  soft-tissue]
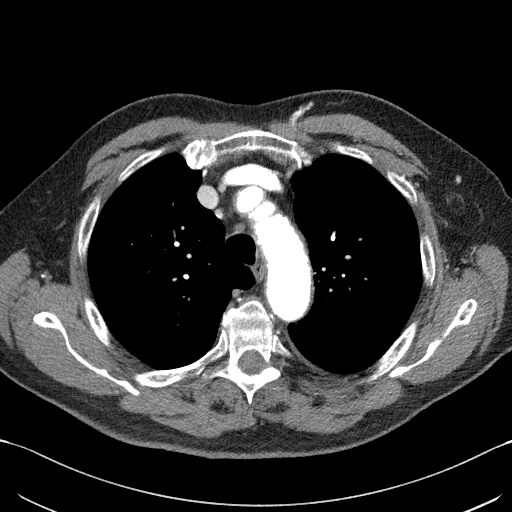
[im 100/119  lung]
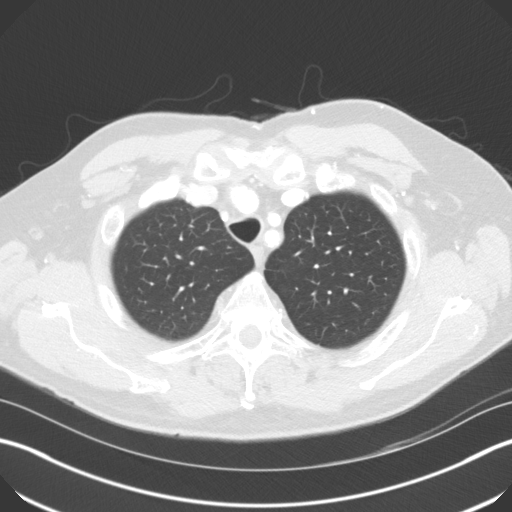
[im 109/119  soft-tissue]
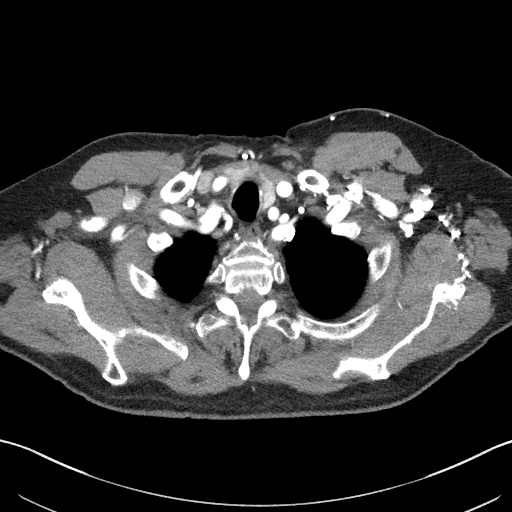

[Series 5: lung · axial · 0.74mm/px · z∈[-384,-330]mm · 2 of 119 slices shown]
[im 10/119  soft-tissue]
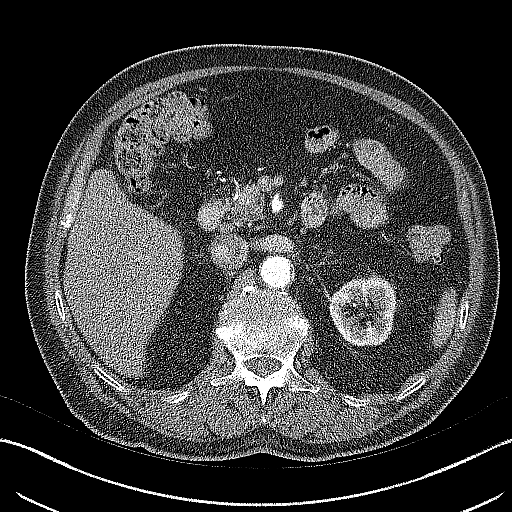
[im 28/119  soft-tissue]
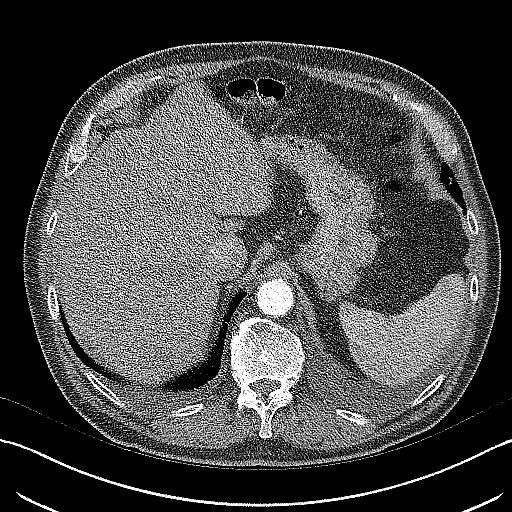

[Series 7: coronals · coronal · 0.72mm/px · 3 of 138 slices shown]
[im 35/138  soft-tissue]
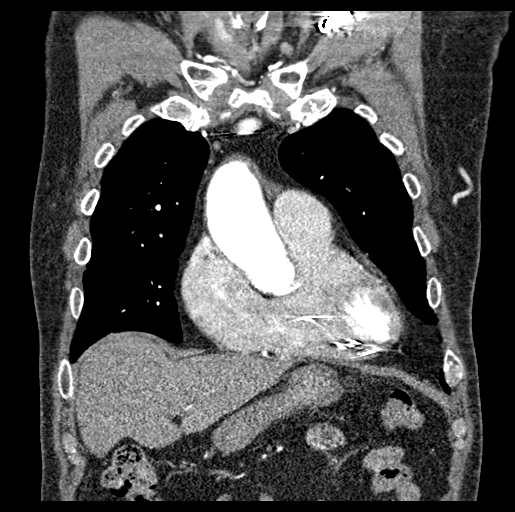
[im 69/138  soft-tissue]
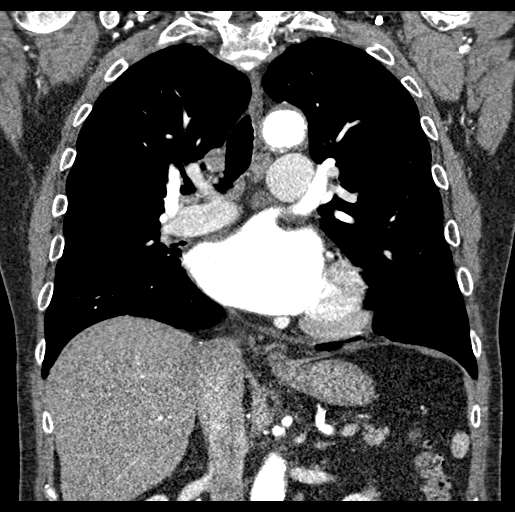
[im 103/138  soft-tissue]
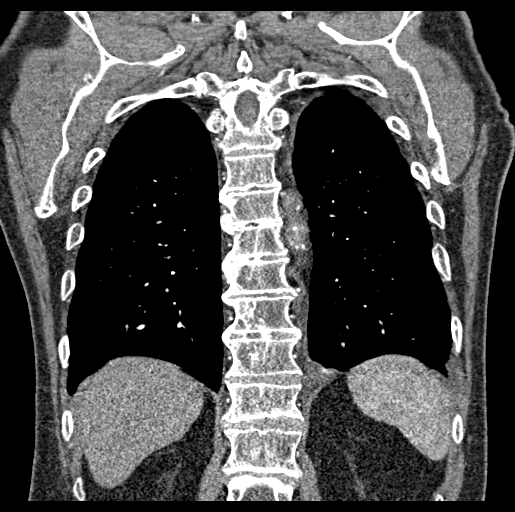

[17 of 46 positions shown; findings below may reference images not displayed]

FINDINGS: Cardiovascular: The ascending thoracic aorta has a measured diameter
4.4 x 4.4 cm. The measured diameter the aorta at the sinuses of
Valsalva measures 4.6 cm in diameter. At the sinotubular junction,
the measured diameter of the aorta is measured at 3.0 cm. The
measured diameter of the aorta at the arch level measures 3.3 cm.
The measured diameter of the descending thoracic aorta at the level
of the main pulmonary outflow tract measures 3.0 x 2.9 cm. There is
no appreciable thoracic aortic dissection. There are scattered foci
of calcification in visualized great vessels. There are multiple
foci of aortic atherosclerosis. There are foci of coronary artery
calcification. There is no pericardial effusion or pericardial
thickening. There is no demonstrable pulmonary embolus. The main
pulmonary outflow tract has a measured diameter of 3.9 cm,
prominent. Pacemaker is present with lead tip extending to the right
ventricle with the tip appearing in the pericardium anteriorly.

Mediastinum/Nodes: Thyroid appears normal. There are multiple
subcentimeter mediastinal lymph nodes. There is a subcarinal lymph
node measuring 1.7 x 1.5 cm. There is a lymph node anterior to the
right carina with a central fatty hilum measuring 2.3 x 2.1 cm. No
esophageal lesions are appreciable.

Lungs/Pleura: There are fairly small free-flowing pleural effusions
bilaterally with compressive atelectasis in the lung bases. There is
no edema or airspace opacity.

Upper Abdomen: Gallbladder is absent. There is upper abdominal
aortic atherosclerosis. There is a 6 mm cyst in the upper pole left
kidney medially and posteriorly.

Musculoskeletal: There is degenerative change in the thoracic spine.
There is anterior wedging of the T12 vertebral body. There are no
blastic or lytic bone lesions. No chest wall lesions evident. The
pacemaker device is present on the left anteriorly.

Review of the MIP images confirms the above findings.
IMPRESSION: 1. The ascending thoracic aorta at the level of the sinuses of
Valsalva measures 4.6 cm in diameter. The ascending thoracic aorta
has a measured diameter of 4.4 x 4.4 cm in its midportion. Other
measurements as noted. Ascending thoracic aortic aneurysm. Recommend
semi-annual imaging followup by CTA or MRA and referral to
cardiothoracic surgery if not already obtained. This recommendation
follows 2303 ACCF/AHA/AATS/ACR/ASA/SCA/MERLEY/ERNI/SENIA/DANYLA Guidelines
for the Diagnosis and Management of Patients With Thoracic Aortic
Disease. Circulation. 2303; 121: E266-e369. Aortic aneurysm NOS
(5F9KA-W45.3). No dissection. Foci of aortic atherosclerosis as well
as foci of great vessel and coronary artery calcification noted.

2. Pacemaker present with lead tip appearing in the pericardium
anterior to the right ventricle.

3. Prominence of the main pulmonary outflow tract is felt to be
indicative of a degree of pulmonary arterial hypertension.

4. Fairly small and essentially symmetric free-flowing pleural
effusions with bibasilar atelectasis. No airspace consolidation or
edema evident.

5. Prominent subcarinal and precarinal lymph nodes of uncertain
etiology.

6.  No evident pulmonary embolus.

7.  Absent gallbladder.

Aortic Atherosclerosis (5F9KA-EJA.A).

## 2020-09-28 DIAGNOSIS — L84 Corns and callosities: Secondary | ICD-10-CM | POA: Diagnosis not present

## 2020-09-28 DIAGNOSIS — M79672 Pain in left foot: Secondary | ICD-10-CM | POA: Diagnosis not present

## 2020-09-28 DIAGNOSIS — M79671 Pain in right foot: Secondary | ICD-10-CM | POA: Diagnosis not present

## 2020-10-05 ENCOUNTER — Other Ambulatory Visit: Payer: Self-pay | Admitting: Family Medicine

## 2020-10-07 DIAGNOSIS — C61 Malignant neoplasm of prostate: Secondary | ICD-10-CM | POA: Diagnosis not present

## 2020-10-07 DIAGNOSIS — R972 Elevated prostate specific antigen [PSA]: Secondary | ICD-10-CM | POA: Diagnosis not present

## 2020-10-15 ENCOUNTER — Other Ambulatory Visit: Payer: Self-pay | Admitting: Cardiothoracic Surgery

## 2020-10-15 DIAGNOSIS — C61 Malignant neoplasm of prostate: Secondary | ICD-10-CM | POA: Diagnosis not present

## 2020-10-15 DIAGNOSIS — I712 Thoracic aortic aneurysm, without rupture, unspecified: Secondary | ICD-10-CM

## 2020-10-23 ENCOUNTER — Other Ambulatory Visit (HOSPITAL_COMMUNITY): Payer: Self-pay | Admitting: Urology

## 2020-10-23 DIAGNOSIS — C61 Malignant neoplasm of prostate: Secondary | ICD-10-CM

## 2020-11-03 ENCOUNTER — Ambulatory Visit (INDEPENDENT_AMBULATORY_CARE_PROVIDER_SITE_OTHER): Payer: PPO | Admitting: Medical

## 2020-11-03 ENCOUNTER — Encounter: Payer: Self-pay | Admitting: Medical

## 2020-11-03 ENCOUNTER — Other Ambulatory Visit: Payer: Self-pay

## 2020-11-03 ENCOUNTER — Telehealth: Payer: Self-pay | Admitting: Medical

## 2020-11-03 VITALS — BP 140/80 | HR 58 | Temp 98.6°F | Resp 18 | Ht 72.0 in | Wt 226.0 lb

## 2020-11-03 DIAGNOSIS — R040 Epistaxis: Secondary | ICD-10-CM | POA: Diagnosis not present

## 2020-11-03 DIAGNOSIS — R7989 Other specified abnormal findings of blood chemistry: Secondary | ICD-10-CM | POA: Diagnosis not present

## 2020-11-03 LAB — CBC WITH DIFFERENTIAL/PLATELET
Basophils Absolute: 0.1 10*3/uL (ref 0.0–0.1)
Basophils Relative: 1 % (ref 0.0–3.0)
Eosinophils Absolute: 0.2 10*3/uL (ref 0.0–0.7)
Eosinophils Relative: 3 % (ref 0.0–5.0)
HCT: 42 % (ref 39.0–52.0)
Hemoglobin: 13.9 g/dL (ref 13.0–17.0)
Lymphocytes Relative: 13.3 % (ref 12.0–46.0)
Lymphs Abs: 0.9 10*3/uL (ref 0.7–4.0)
MCHC: 33.1 g/dL (ref 30.0–36.0)
MCV: 90.5 fl (ref 78.0–100.0)
Monocytes Absolute: 0.9 10*3/uL (ref 0.1–1.0)
Monocytes Relative: 14.2 % — ABNORMAL HIGH (ref 3.0–12.0)
Neutro Abs: 4.5 10*3/uL (ref 1.4–7.7)
Neutrophils Relative %: 68.5 % (ref 43.0–77.0)
Platelets: 154 10*3/uL (ref 150.0–400.0)
RBC: 4.64 Mil/uL (ref 4.22–5.81)
RDW: 15.2 % (ref 11.5–15.5)
WBC: 6.6 10*3/uL (ref 4.0–10.5)

## 2020-11-03 NOTE — Telephone Encounter (Signed)
Pt request referral to ENT Dr. Benjamine Mola. [place] ent.

## 2020-11-03 NOTE — Progress Notes (Signed)
Subjective:    Patient ID: Brian Davidson, male    DOB: December 28, 1935, 85 y.o.   MRN: [id number]  HPI  Pt notes that over past 2-3 weeks he is having some intermittent nose bleeds in morning. Pt states this am when he cleared nose there was some dried blood. He states he does not have dripping or running type blood. But if he gets kleenex and goes into nose will see some blood on kleenex. Occurs on both side but more on rt side.  Pt runs humidifyer for house.  Pt is on xarelto presently.      Review of Systems  Constitutional: Negative for chills, fatigue and fever.  HENT: Negative for congestion, ear discharge, ear pain, mouth sores, sore throat and trouble swallowing.   Respiratory: Positive for chest tightness. Negative for cough, shortness of breath and wheezing.   Cardiovascular: Negative for chest pain and palpitations.  Gastrointestinal: Negative for abdominal pain and blood in stool.  Genitourinary: Negative for dysuria.  Musculoskeletal: Negative for back pain.  Skin: Negative for rash.  Neurological: Negative for dizziness, seizures, syncope, weakness and headaches.  Hematological: Negative for adenopathy. Does not bruise/bleed easily.  Psychiatric/Behavioral: Negative for behavioral problems and confusion.    Past Medical History:  Diagnosis Date  . Arthritis   . Biliary dyskinesia   . Carotid artery disease ([place]) 07/01/2016  . Dyslipidemia 07/13/2017  . Dysrhythmia    a fib  . GERD (gastroesophageal reflux disease)    occasional  . H/O measles   . History of chicken pox   . History of kidney stones   . Hypertension   . Neuromuscular disorder (HCC)    neuropathy feet   . Persistent atrial fibrillation ([place])   . Pneumonia 1992  . Presence of permanent cardiac pacemaker   . Tachycardia-bradycardia ([place])    a. s/p STJ dual chamber pacemaker  . Thiamine deficiency 01/19/2018  . Valvular heart disease 07/01/2016     Social History   Socioeconomic History   . Marital status: Married    Spouse name: [name]  . Number of children: 1  . Years of education: Not on file  . Highest education level: Not on file  Occupational History  . Occupation: Retired  . 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 Smoking status: Former Smoker    Years: 10.00    Types: Pipe, Cigars    Quit date: 11/18/1985    Years since quitting: 34.9  . Smokeless tobacco: Never Used  Vaping Use  . Vaping Use: Never used  Substance and Sexual Activity  . Alcohol use: Yes    Alcohol/week: 3.0 standard drinks    Types: 3 Cans of beer per week    Comment: occ  . Drug use: No  . Sexual activity: Not Currently  Other Topics Concern  . Not on file  Social History Narrative   Lives with wife, still works part-time as a Geophysicist/field seismologist. He is active around the house and yard...   Parents are deceased and did not have cardiac issues but he has 2 siblings with atrial fibrillation and a sister-in-law  has a pacemaker.   Social Determinants of Health   Financial Resource Strain: Low Risk   . Difficulty of Paying Living Expenses: Not hard at all  Food Insecurity: No Food Insecurity  . Worried About Charity fundraiser in the Last Year: Never true  . Ran Out of Food in the Last Year: Never true  Transportation Needs: No Transportation Needs  . Lack of Transportation (Medical): No  . Lack of Transportation (Non-Medical): No  Physical Activity: Sufficiently Active  . Days of Exercise per Week: 7 days  . Minutes of Exercise per Session: 30 min  Stress: Not on file  Social Connections: Not on file  Intimate Partner Violence: Not on file    Past Surgical History:  Procedure Laterality Date  . CHOLECYSTECTOMY N/A 11/24/2015   Procedure: LAPAROSCOPIC CHOLECYSTECTOMY;  Surgeon: Greer Pickerel, MD;  Location: [facility];  Service: General;  Laterality: N/A;  . CYSTOSCOPY  2016  . EYE SURGERY     bil cataract  . FRACTURE SURGERY  2005   left ankle  . HERNIA REPAIR      left groin, no mesh  . PERMANENT PACEMAKER INSERTION N/A 01/31/2012   SJM Accent SR RF implanted by DR Allred  . TOTAL KNEE ARTHROPLASTY Right 12/23/2019   Procedure: TOTAL KNEE ARTHROPLASTY;  Surgeon: Gaynelle Arabian, MD;  Location: [facility];  Service: Orthopedics;  Laterality: Right;  2min    Family History  Problem Relation Age of Onset  . Hypertension Mother   . Arthritis Mother   . Drug abuse Mother   . Stroke Father   . Heart disease Father   . Heart disease Sister 11  . Heart disease Brother   . Hypertension Son   . Heart disease Sister   . Neuropathy Brother   . Hypertension Brother   . Atrial fibrillation Brother   . Atrial fibrillation Sister   . Colon cancer Neg Hx     Allergies  Allergen Reactions  . Cardizem [Diltiazem]     Rash from Yellow dye in generic capsule. Can take different brand. Takes diltiazem - his allergy is to Cardizem  . Sulfa Antibiotics     Unknown childhood reaction    Current Outpatient Medications on File Prior to Visit  Medication Sig Dispense Refill  . Biotin 5000 MCG TABS Take 5,000 mg by mouth daily.    . Calcium Carbonate-Vitamin D (CALTRATE 600+D PO) Take 600 mg by mouth daily.     . Cholecalciferol 50 MCG (2000 UT) TABS Take 2,000 Units by mouth daily.     . Cyanocobalamin (VITAMIN B-12) 5000 MCG TBDP Take by mouth daily.    . diclofenac Sodium (VOLTAREN) 1 % GEL Apply topically daily as needed (Pain).     Marland Kitchen diltiazem (CARDIZEM CD) 240 MG 24 hr capsule Take 1 capsule (240 mg total) by mouth daily. 90 capsule 1  . fluticasone (FLONASE) 50 MCG/ACT nasal spray Place 2 sprays into both nostrils daily. 16 g 1  . gabapentin (NEURONTIN) 300 MG capsule Take 1 capsule by mouth twice daily 180 capsule 1  . irbesartan-hydrochlorothiazide (AVALIDE) 150-12.5 MG tablet Take 0.5 tablets by mouth daily. 45 tablet 1  . magnesium oxide (MAG-OX) 400 MG tablet Take 400 mg by mouth daily.    . metoprolol tartrate (LOPRESSOR) 100 MG tablet Take 1 tablet  by mouth twice daily 180 tablet 1  . rivaroxaban (XARELTO) 20 MG TABS tablet Take 1 tablet (20 mg total) by mouth daily. 90 tablet 1  . thiamine 100 MG tablet Take 100 mg by mouth daily.     . Turmeric 500 MG TABS Take 500 mg by mouth daily.     . methylPREDNISolone (MEDROL) 4 MG tablet 4 tab po day 1, 3 tab po day 2, 2 tab po day 3, 1 tab po day 4 10 tablet 0  No current facility-administered medications on file prior to visit.    BP (!) 158/80   Pulse (!) 58   Temp 98.6 F (37 C)   Resp 18   Ht 6' (1.829 m)   Wt 226 lb (102.5 kg)   SpO2 96%   BMI 30.65 kg/m       Objective:   Physical Exam  General Mental Status- Alert. General Appearance- Not in acute distress.   Skin General: Color- Normal Color. Moisture- Normal Moisture.  Neck Carotid Arteries- Normal color. Moisture- Normal Moisture. No carotid bruits. No JVD.  Chest and Lung Exam Auscultation: Breath Sounds:-Normal.  Cardiovascular Auscultation:Rythm- Regular. Murmurs & Other Heart Sounds:Auscultation of the heart reveals- No Murmurs.  Abdomen Inspection:-Inspeection Normal. Palpation/Percussion:Note:No mass. Palpation and Percussion of the abdomen reveal- Non Tender, Non Distended + BS, no rebound or guarding.   Neurologic Cranial Nerve exam:- CN III-XII intact(No nystagmus), symmetric smile. Strength:- 5/5 equal and symmetric strength both upper and lower extremities.  heent- no sinus pressure. Septum looks deviated to the rt. Using q tip and putting into nose can see small spot of blood on cotton.      Assessment & Plan:  For recent epistaxis for past 2-3 weeks will refer you to ENT Dr. Benjamine Mola. On exam can't visualize full area of slight bleed due to location and apparenent deviated septum. If nose bleed worsens pending referral date appointment let us know.  Discussed conservative measures to stop bleed if worsens.  Will get cbc to evaluate platelets. Mild low in past 10 month ago but since  normal.  Follow up as regularly scheduled with pcp or as needed

## 2020-11-03 NOTE — Patient Instructions (Addendum)
For recent epistaxis for past 2-3 weeks will refer you to ENT Dr. Benjamine Mola. On exam can't visualize full area of slight bleed due to location and apparenent deviated septum. If nose bleed worsens pending referral date appointment let us know.  Discussed conservative measures to stop bleed if worsens.  Will get cbc to evaluate platelets. Mild low in past 10 month ago but since normal.  Follow up as regularly scheduled with pcp or as needed   Nosebleed, Adult A nosebleed is when blood comes out of the nose. Nosebleeds are common and can be caused by many things. They are usually not a sign of a serious medical problem. Follow these instructions at home: When you have a nosebleed:  Sit down.  Tilt your head a little forward.  Follow these steps: 1. Pinch your nose with a clean towel or tissue. 2. Keep pinching your nose for 5 minutes. Do not let go. 3. After 5 minutes, let go of your nose. 4. If there is still bleeding, do these steps again. Keep doing these steps until the bleeding stops.  Do not put tissues or other things in your nose to stop the bleeding.  Avoid lying down or putting your head back.  Use a nose spray decongestant as told by your doctor.   After a nosebleed:  Try not to blow your nose or sniffle for several hours.  Try not to strain, lift, or bend at the waist for several days.  Aspirin and blood-thinning medicines make bleeding more likely. If you take these medicines: ? Ask your doctor if you should stop taking them or if you should change how much you take. ? Do not stop taking the medicine unless your doctor tells you to.  If your nosebleed was caused by dryness, use over-the-counter saline nasal spray or gel and humidifier as told by your doctor. This will keep the inside of your nose moist and allow it to heal. If you need to use one of these products: ? Choose one that is water-soluble. ? Use only as much as you need and use it only as often as  needed. ? Do not lie down right away after you use it.  If you get nosebleeds often, talk with your doctor about treatments. These may include: ? Nasal cautery. A chemical swab or electrical device is used to lightly burn tiny blood vessels inside the nose. This helps stop or prevent nosebleeds. ? Nasal packing. A gauze or other material is placed in the nose to keep constant pressure on the bleeding area. Contact a doctor if:  You have a fever.  You get nosebleeds often.  You are getting nosebleeds more often than usual.  You bruise very easily.  You have something stuck in your nose.  You have bleeding in your mouth.  You vomit or cough up brown material.  You get a nosebleed after you start a new medicine. Get help right away if:  You have a nosebleed after you fall or hurt your head.  Your nosebleed does not go away after 20 minutes.  You feel dizzy or weak.  You have unusual bleeding from other parts of your body.  You have unusual bruising on other parts of your body.  You get sweaty.  You vomit blood. Summary  Nosebleeds are common. They are usually not a sign of a serious medical problem.  When you have a nosebleed, sit down and tilt your head a little forward. Pinch your nose with a  clean tissue for 5 minutes.  Use saline spray or saline gel and a humidifier as told by your doctor.  Get help right away if your nosebleed does not go away after 20 minutes. This information is not intended to replace advice given to you by your health care provider. Make sure you discuss any questions you have with your health care provider. Document Revised: 06/20/2019 Document Reviewed: 06/20/2019 Elsevier Patient Education  2021 [employer].

## 2020-11-09 ENCOUNTER — Telehealth: Payer: Self-pay | Admitting: Pharmacist

## 2020-11-09 NOTE — Progress Notes (Addendum)
Chronic Care Management Pharmacy Assistant   Name: KEYONTA BARRADAS  MRN: [id number] DOB: [birth year]  Brian Davidson is an 85 y.o. year old male who presents for his follow-up CCM visit with the clinical pharmacist.  Reason for Encounter: Disease State For CHL   Conditions to be addressed/monitored: Hypertension, Hyperlipidemia/CAD, AFib, Osteopenia, Neuropathy/Pain  Recent office visits:  11/03/20 Mackie Pai, PA-C. For Epistaxis. Referral to an ENT. Labs drawn. No medication changes.  Recent consult visits: None since 09/23/20  Hospital visits:  None in previous 6 months  Medications: Outpatient Encounter Medications as of 11/09/2020  Medication Sig   Biotin 5000 MCG TABS Take 5,000 mg by mouth daily.   Calcium Carbonate-Vitamin D (CALTRATE 600+D PO) Take 600 mg by mouth daily.    Cholecalciferol 50 MCG (2000 UT) TABS Take 2,000 Units by mouth daily.    Cyanocobalamin (VITAMIN B-12) 5000 MCG TBDP Take by mouth daily.   diclofenac Sodium (VOLTAREN) 1 % GEL Apply topically daily as needed (Pain).    diltiazem (CARDIZEM CD) 240 MG 24 hr capsule Take 1 capsule (240 mg total) by mouth daily.   fluticasone (FLONASE) 50 MCG/ACT nasal spray Place 2 sprays into both nostrils daily.   gabapentin (NEURONTIN) 300 MG capsule Take 1 capsule by mouth twice daily   irbesartan-hydrochlorothiazide (AVALIDE) 150-12.5 MG tablet Take 0.5 tablets by mouth daily.   magnesium oxide (MAG-OX) 400 MG tablet Take 400 mg by mouth daily.   methylPREDNISolone (MEDROL) 4 MG tablet 4 tab po day 1, 3 tab po day 2, 2 tab po day 3, 1 tab po day 4   metoprolol tartrate (LOPRESSOR) 100 MG tablet Take 1 tablet by mouth twice daily   rivaroxaban (XARELTO) 20 MG TABS tablet Take 1 tablet (20 mg total) by mouth daily.   thiamine 100 MG tablet Take 100 mg by mouth daily.    Turmeric 500 MG TABS Take 500 mg by mouth daily.    No facility-administered encounter medications on file as of 11/09/2020.    11/09/2020 Name: HASSANI SLINEY MRN: [id number] DOB: 1935/11/11 PRADYUN ISHMAN is a 85 y.o. year old male who is a primary care patient of Mosie Lukes, MD.  Comprehensive medication review performed; Spoke to patient regarding cholesterol  Lipid Panel    Component Value Date/Time   CHOL 168 05/26/2020 1103   TRIG 110 05/26/2020 1103   HDL 40 05/26/2020 1103   LDLCALC 107 (H) 05/26/2020 1103    10-year ASCVD risk score: The ASCVD Risk score Mikey Bussing DC Jr., et al., 2013) failed to calculate for the following reasons:   The 2013 ASCVD risk score is only valid for ages 24 to 60  Current antihyperlipidemic regimen:  Diet and exercise management   Previous antihyperlipidemic medications tried: None noted.  ASCVD risk enhancing conditions: age >62 and HTN   What recent interventions/DTPs have been made by any provider to improve Cholesterol control since last CPP Visit: None.  Any recent hospitalizations or ED visits since last visit with CPP? Patient stated no.  What diet changes have been made to improve Cholesterol?  Patient stated 90% of the time he eats home cooked meals. He stated he eats a well rounded diet. Patient stated he drinks about 2-4 cups of water daily   What exercise is being done to improve Cholesterol?  Patient stated he walks daily for about 2.5 miles. He stated he is outside in the yard when the weather permits.   Adherence Review: Does  the patient have >5 day gap between last estimated fill dates? No  Follow Up: Pharmacist Review  Patient stated he has an appointment on the 23 rd to see the ENT about his off and on nose bleeds. He recently found out he had prostate cancer has many appointments scheduled for treatment and testing. Patient stated he does not have any concerns or questions about his medication    Charlann Lange, RMA Clinical Pharmacist Assistant [phone]  10 minutes spent in review, coordination, and documentation.  Reviewed  by: Beverly Milch, PharmD Clinical Pharmacist [facility] [phone]

## 2020-11-12 ENCOUNTER — Encounter (HOSPITAL_COMMUNITY)
Admission: RE | Admit: 2020-11-12 | Discharge: 2020-11-12 | Disposition: A | Discharge status: Home / Self Care | Payer: PPO | Source: Ambulatory Visit | Attending: Urology | Admitting: Urology

## 2020-11-12 ENCOUNTER — Other Ambulatory Visit: Payer: Self-pay

## 2020-11-12 DIAGNOSIS — C61 Malignant neoplasm of prostate: Secondary | ICD-10-CM | POA: Insufficient documentation

## 2020-11-12 DIAGNOSIS — M19012 Primary osteoarthritis, left shoulder: Secondary | ICD-10-CM | POA: Diagnosis not present

## 2020-11-12 DIAGNOSIS — K5730 Diverticulosis of large intestine without perforation or abscess without bleeding: Secondary | ICD-10-CM | POA: Diagnosis not present

## 2020-11-12 DIAGNOSIS — M19011 Primary osteoarthritis, right shoulder: Secondary | ICD-10-CM | POA: Diagnosis not present

## 2020-11-12 DIAGNOSIS — M170 Bilateral primary osteoarthritis of knee: Secondary | ICD-10-CM | POA: Diagnosis not present

## 2020-11-12 DIAGNOSIS — I700 Atherosclerosis of aorta: Secondary | ICD-10-CM | POA: Diagnosis not present

## 2020-11-12 DIAGNOSIS — Z8546 Personal history of malignant neoplasm of prostate: Secondary | ICD-10-CM | POA: Diagnosis not present

## 2020-11-12 MED ORDER — TECHNETIUM TC 99M MEDRONATE IV KIT
22.0000 | PACK | Freq: Once | INTRAVENOUS | Status: AC
  Administered 2020-11-12: 22 via INTRAVENOUS

## 2020-11-19 DIAGNOSIS — M25572 Pain in left ankle and joints of left foot: Secondary | ICD-10-CM | POA: Diagnosis not present

## 2020-11-23 ENCOUNTER — Ambulatory Visit
Admission: RE | Admit: 2020-11-23 | Discharge: 2020-11-23 | Disposition: A | Discharge status: Home / Self Care | Payer: PPO | Source: Ambulatory Visit | Attending: Cardiothoracic Surgery | Admitting: Cardiothoracic Surgery

## 2020-11-23 ENCOUNTER — Ambulatory Visit: Payer: PPO | Admitting: Cardiothoracic Surgery

## 2020-11-23 ENCOUNTER — Other Ambulatory Visit: Payer: Self-pay

## 2020-11-23 ENCOUNTER — Encounter: Payer: Self-pay | Admitting: Cardiothoracic Surgery

## 2020-11-23 VITALS — BP 123/65 | HR 82 | Resp 20 | Ht 72.0 in | Wt 226.0 lb

## 2020-11-23 DIAGNOSIS — I712 Thoracic aortic aneurysm, without rupture, unspecified: Secondary | ICD-10-CM

## 2020-11-23 DIAGNOSIS — J9811 Atelectasis: Secondary | ICD-10-CM | POA: Diagnosis not present

## 2020-11-23 DIAGNOSIS — J90 Pleural effusion, not elsewhere classified: Secondary | ICD-10-CM | POA: Diagnosis not present

## 2020-11-23 DIAGNOSIS — I2510 Atherosclerotic heart disease of native coronary artery without angina pectoris: Secondary | ICD-10-CM | POA: Diagnosis not present

## 2020-11-23 NOTE — Progress Notes (Signed)
Chief complaint: Ascending aortic aneurysm  History of present illness:  85 year old man presents for surveillance of incidentally discovered ascending aortic aneurysm.  He has no personal history of aneurysm but a family history of aneurysm rupture in his mother.  He has been followed for approximately 10 years and this has been slowly progressing according to his account.  He has no history of chest pain or back pain.  He does however endorse presyncopal events when he stands up or walks too quickly upon standing.  He was recently diagnosed with prostate cancer and is awaiting therapeutic plan  Past Medical History:  Diagnosis Date  . Arthritis   . Biliary dyskinesia   . Carotid artery disease ([place]) 07/01/2016  . Dyslipidemia 07/13/2017  . Dysrhythmia    a fib  . GERD (gastroesophageal reflux disease)    occasional  . H/O measles   . History of chicken pox   . History of kidney stones   . Hypertension   . Neuromuscular disorder (HCC)    neuropathy feet   . Persistent atrial fibrillation ([place])   . Pneumonia 1992  . Presence of permanent cardiac pacemaker   . Tachycardia-bradycardia ([place])    a. s/p STJ dual chamber pacemaker  . Thiamine deficiency 01/19/2018  . Valvular heart disease 07/01/2016    Current Outpatient Medications on File Prior to Visit  Medication Sig Dispense Refill  . Biotin 5000 MCG TABS Take 5,000 mg by mouth daily.    . Calcium Carbonate-Vitamin D (CALTRATE 600+D PO) Take 600 mg by mouth daily.     . Cholecalciferol 50 MCG (2000 UT) TABS Take 2,000 Units by mouth daily.     . Cyanocobalamin (VITAMIN B-12) 5000 MCG TBDP Take by mouth daily.    . diclofenac Sodium (VOLTAREN) 1 % GEL Apply topically daily as needed (Pain).     Marland Kitchen diltiazem (CARDIZEM CD) 240 MG 24 hr capsule Take 1 capsule (240 mg total) by mouth daily. 90 capsule 1  . fluticasone (FLONASE) 50 MCG/ACT nasal spray Place 2 sprays into both nostrils daily. 16 g 1  . gabapentin (NEURONTIN) 300 MG  capsule Take 1 capsule by mouth twice daily 180 capsule 1  . irbesartan-hydrochlorothiazide (AVALIDE) 150-12.5 MG tablet Take 0.5 tablets by mouth daily. 45 tablet 1  . magnesium oxide (MAG-OX) 400 MG tablet Take 400 mg by mouth daily.    . metoprolol tartrate (LOPRESSOR) 100 MG tablet Take 1 tablet by mouth twice daily 180 tablet 1  . rivaroxaban (XARELTO) 20 MG TABS tablet Take 1 tablet (20 mg total) by mouth daily. 90 tablet 1  . thiamine 100 MG tablet Take 100 mg by mouth daily.     . Turmeric 500 MG TABS Take 500 mg by mouth daily.     . methylPREDNISolone (MEDROL) 4 MG tablet 4 tab po day 1, 3 tab po day 2, 2 tab po day 3, 1 tab po day 4 10 tablet 0   No current facility-administered medications on file prior to visit.    Physical exam: BP 123/65   Pulse 82   Resp 20   Ht 6' (1.829 m)   Wt 102.5 kg   SpO2 97% Comment: RA  BMI 30.65 kg/m  Well-appearing man in no acute distress HEENT: Normocephalic atraumatic no JVD Chest: Clear to auscultation Heart: Regular rate and rhythm no murmur detected Extremities: Warm and well-perfused  Imaging: I personally reviewed his CT scan from today which demonstrates 4.8 cm ascending aortic aneurysm.  In addition  there is heavy calcification in the area of the left main coronary artery and its branches; there is also calcific disease involving the right coronary artery  Impression: Stable ascending aortic aneurysm with new appreciation of coronary calcification on the noncontrast CT scan this may be associated with his endorse symptoms of presyncope  Plan: Follow-up in 4 months with coronary CT angiogram to assess coronary calcification and/or stenosis At present, there is no need to alter his therapeutic plan for prostate cancer based on his heart.  Broadus Z. Orvan Seen, [place]

## 2020-11-24 ENCOUNTER — Encounter: Payer: Self-pay | Admitting: Family Medicine

## 2020-11-24 ENCOUNTER — Ambulatory Visit (INDEPENDENT_AMBULATORY_CARE_PROVIDER_SITE_OTHER): Payer: PPO | Admitting: Family Medicine

## 2020-11-24 DIAGNOSIS — I712 Thoracic aortic aneurysm, without rupture, unspecified: Secondary | ICD-10-CM

## 2020-11-24 DIAGNOSIS — E785 Hyperlipidemia, unspecified: Secondary | ICD-10-CM | POA: Diagnosis not present

## 2020-11-24 DIAGNOSIS — E519 Thiamine deficiency, unspecified: Secondary | ICD-10-CM

## 2020-11-24 DIAGNOSIS — C61 Malignant neoplasm of prostate: Secondary | ICD-10-CM | POA: Diagnosis not present

## 2020-11-24 DIAGNOSIS — R739 Hyperglycemia, unspecified: Secondary | ICD-10-CM | POA: Diagnosis not present

## 2020-11-24 DIAGNOSIS — I10 Essential (primary) hypertension: Secondary | ICD-10-CM

## 2020-11-24 DIAGNOSIS — I4821 Permanent atrial fibrillation: Secondary | ICD-10-CM

## 2020-11-24 DIAGNOSIS — Z8546 Personal history of malignant neoplasm of prostate: Secondary | ICD-10-CM | POA: Insufficient documentation

## 2020-11-24 LAB — COMPREHENSIVE METABOLIC PANEL
ALT: 29 U/L (ref 0–53)
AST: 20 U/L (ref 0–37)
Albumin: 4 g/dL (ref 3.5–5.2)
Alkaline Phosphatase: 66 U/L (ref 39–117)
BUN: 30 mg/dL — ABNORMAL HIGH (ref 6–23)
CO2: 26 mEq/L (ref 19–32)
Calcium: 9 mg/dL (ref 8.4–10.5)
Chloride: 104 mEq/L (ref 96–112)
Creatinine, Ser: 1.22 mg/dL (ref 0.40–1.50)
GFR: 54.95 mL/min — ABNORMAL LOW (ref >60.00)
Glucose, Bld: 122 mg/dL — ABNORMAL HIGH (ref 70–99)
Potassium: 3.8 mEq/L (ref 3.5–5.1)
Sodium: 139 mEq/L (ref 135–145)
Total Bilirubin: 0.8 mg/dL (ref 0.2–1.2)
Total Protein: 6.4 g/dL (ref 6.0–8.3)

## 2020-11-24 LAB — LIPID PANEL
Cholesterol: 174 mg/dL (ref 0–200)
HDL: 42.1 mg/dL (ref >39.00)
LDL Cholesterol: 100 mg/dL — ABNORMAL HIGH (ref 0–99)
NonHDL: 131.95
Total CHOL/HDL Ratio: 4
Triglycerides: 158 mg/dL — ABNORMAL HIGH (ref 0.0–149.0)
VLDL: 31.6 mg/dL (ref 0.0–40.0)

## 2020-11-24 LAB — CBC
HCT: 43.2 % (ref 39.0–52.0)
Hemoglobin: 14.5 g/dL (ref 13.0–17.0)
MCHC: 33.5 g/dL (ref 30.0–36.0)
MCV: 90.4 fl (ref 78.0–100.0)
Platelets: 169 10*3/uL (ref 150.0–400.0)
RBC: 4.78 Mil/uL (ref 4.22–5.81)
RDW: 14.7 % (ref 11.5–15.5)
WBC: 10.4 10*3/uL (ref 4.0–10.5)

## 2020-11-24 LAB — HEMOGLOBIN A1C: Hgb A1c MFr Bld: 5.7 % (ref 4.6–6.5)

## 2020-11-24 LAB — TSH: TSH: 2.01 u[IU]/mL (ref 0.35–4.50)

## 2020-11-24 NOTE — Progress Notes (Signed)
Patient ID: Brian Davidson, male    DOB: 1936-01-31  Age: 85 y.o. MRN: [id number]    Subjective:  Subjective  HPI Brian Davidson presents for office visit today.  He occasionally checks his BP at home when he feels like it is low(110/70-80). He states that some times it causes dizziness when walking but quickly resolves. He denies any chest pain, SOB, fever, abdominal pain, cough, chills, sore throat, dysuria, urinary incontinence, back pain, HA, or N/VD at this time.  BP Readings from Last 3 Encounters:  11/24/20 (!) 142/72  11/23/20 123/65  11/03/20 140/80   He states that he is going to cardiology to monitor his heart issues. He notes that a echo and CAT scan of the heart was taken and Arthrosclerosis was noted. He was also recently dx with prostate cancer by his urologist. He reports that he has an appointment coming up soon to discuss a plan for treatment. Denies CP/palp/SOB/HA/congestion/fevers/GI or GU c/o. Taking meds as prescribed  He reports that his wife suffered a TIA and recently underwent surgery. He states that his diet has changed due to neighbors and the community giving them food for comfort. However, he is doing his best to maintain his weight.  Lab Results  Component Value Date   HGBA1C 5.7 (H) 05/26/2020    Review of Systems  Constitutional: Negative for chills, fatigue and fever.  HENT: Negative for congestion, ear pain, rhinorrhea, sinus pressure, sinus pain and sore throat.   Eyes: Negative for pain.  Respiratory: Negative for shortness of breath.   Cardiovascular: Negative for chest pain, palpitations and leg swelling.  Gastrointestinal: Negative for abdominal pain, blood in stool, diarrhea, nausea and vomiting.  Genitourinary: Negative for flank pain, frequency and penile pain.  Musculoskeletal: Negative for back pain and myalgias.  Neurological: Negative for headaches.    History Past Medical History:  Diagnosis Date  . Arthritis   . Biliary  dyskinesia   . Carotid artery disease ([place]) 07/01/2016  . Dyslipidemia 07/13/2017  . Dysrhythmia    a fib  . GERD (gastroesophageal reflux disease)    occasional  . H/O measles   . History of chicken pox   . History of kidney stones   . Hypertension   . Neuromuscular disorder ([facility])    neuropathy feet   . Persistent atrial fibrillation ([place])   . Pneumonia 1992  . Presence of permanent cardiac pacemaker   . Tachycardia-bradycardia ([place])    a. s/p STJ dual chamber pacemaker  . Thiamine deficiency 01/19/2018  . Valvular heart disease 07/01/2016    He has a past surgical history that includes permanent pacemaker insertion (N/A, 01/31/2012); Cystoscopy (2016); Cholecystectomy (N/A, 11/24/2015); Fracture surgery (2005); Hernia repair; Eye surgery; and Total knee arthroplasty (Right, 12/23/2019).   His family history includes Arthritis in his mother; Atrial fibrillation in his brother and sister; Drug abuse in his mother; Heart disease in his brother, father, and sister; Heart disease (age of onset: 69) in his sister; Hypertension in his brother, mother, and son; Neuropathy in his brother; Stroke in his father.He reports that he quit smoking about 35 years ago. His smoking use included pipe and cigars. He quit after 10.00 years of use. He has never used smokeless tobacco. He reports current alcohol use of about 3.0 standard drinks of alcohol per week. He reports that he does not use drugs.  Current Outpatient Medications on File Prior to Visit  Medication Sig Dispense Refill  . Biotin 5000 MCG TABS Take 5,000  mg by mouth daily.    . Calcium Carbonate-Vitamin D (CALTRATE 600+D PO) Take 600 mg by mouth daily.     . Cholecalciferol 50 MCG (2000 UT) TABS Take 2,000 Units by mouth daily.     . Cyanocobalamin (VITAMIN B-12) 5000 MCG TBDP Take by mouth daily.    . diclofenac Sodium (VOLTAREN) 1 % GEL Apply topically daily as needed (Pain).     Marland Kitchen diltiazem (CARDIZEM CD) 240 MG 24 hr capsule Take 1  capsule (240 mg total) by mouth daily. 90 capsule 1  . fluticasone (FLONASE) 50 MCG/ACT nasal spray Place 2 sprays into both nostrils daily. 16 g 1  . gabapentin (NEURONTIN) 300 MG capsule Take 1 capsule by mouth twice daily 180 capsule 1  . irbesartan-hydrochlorothiazide (AVALIDE) 150-12.5 MG tablet Take 0.5 tablets by mouth daily. 45 tablet 1  . magnesium oxide (MAG-OX) 400 MG tablet Take 400 mg by mouth daily.    . metoprolol tartrate (LOPRESSOR) 100 MG tablet Take 1 tablet by mouth twice daily 180 tablet 1  . rivaroxaban (XARELTO) 20 MG TABS tablet Take 1 tablet (20 mg total) by mouth daily. 90 tablet 1  . thiamine 100 MG tablet Take 100 mg by mouth daily.     . Turmeric 500 MG TABS Take 500 mg by mouth daily.     . methylPREDNISolone (MEDROL) 4 MG tablet 4 tab po day 1, 3 tab po day 2, 2 tab po day 3, 1 tab po day 4 10 tablet 0   No current facility-administered medications on file prior to visit.     Objective:  Objective  Physical Exam Constitutional:      General: He is not in acute distress.    Appearance: Normal appearance. He is well-developed. He is not ill-appearing.  HENT:     Head: Normocephalic and atraumatic.     Right Ear: External ear normal.     Left Ear: External ear normal.  Eyes:     Extraocular Movements: Extraocular movements intact.     Pupils: Pupils are equal, round, and reactive to light.  Cardiovascular:     Rate and Rhythm: Normal rate. Rhythm irregular.     Heart sounds: Murmur heard.   Systolic murmur is present with a grade of 2/6.   Pulmonary:     Effort: Pulmonary effort is normal. No respiratory distress.     Breath sounds: Normal breath sounds. No wheezing, rhonchi or rales.  Abdominal:     Palpations: There is no mass.     Tenderness: There is no abdominal tenderness. There is no guarding or rebound.  Musculoskeletal:        General: Normal range of motion.     Cervical back: Normal range of motion and neck supple.  Skin:    General:  Skin is warm and dry.  Neurological:     Mental Status: He is alert and oriented to person, place, and time.  Psychiatric:        Behavior: Behavior normal.    BP (!) 142/72   Pulse 68   Temp (!) 97.5 F (36.4 C)   Resp 20   Wt 223 lb 14.4 oz (101.6 kg)   SpO2 99%   BMI 30.37 kg/m  Wt Readings from Last 3 Encounters:  11/24/20 223 lb 14.4 oz (101.6 kg)  11/23/20 226 lb (102.5 kg)  11/03/20 226 lb (102.5 kg)     Lab Results  Component Value Date   WBC 6.6 11/03/2020   HGB  13.9 11/03/2020   HCT 42.0 11/03/2020   PLT 154.0 11/03/2020   GLUCOSE 98 05/26/2020   CHOL 168 05/26/2020   TRIG 110 05/26/2020   HDL 40 05/26/2020   LDLCALC 107 (H) 05/26/2020   ALT 12 05/26/2020   AST 18 05/26/2020   NA 140 05/26/2020   K 4.3 05/26/2020   CL 103 05/26/2020   CREATININE 1.31 (H) 05/26/2020   BUN 21 05/26/2020   CO2 28 05/26/2020   TSH 2.33 05/26/2020   INR 1.5 (H) 12/16/2019   HGBA1C 5.7 (H) 05/26/2020    CT CHEST WO CONTRAST  Result Date: 11/23/2020 CLINICAL DATA:  Thoracic aortic aneurysm. EXAM: CT CHEST WITHOUT CONTRAST TECHNIQUE: Multidetector CT imaging of the chest was performed following the standard protocol without IV contrast. COMPARISON:  05/12/2020. FINDINGS: Cardiovascular: Heart is enlarged. No substantial pericardial effusion. Coronary artery calcification is evident. Atherosclerotic calcification is noted in the wall of the thoracic aorta. Ascending thoracic aorta distal to the sino-tubular junction measures 4.4 cm today, stable since prior study. Left-sided permanent pacemaker evident. Mediastinum/Nodes: No mediastinal lymphadenopathy. Previously identified precarinal node is 15 mm short axis today compared to 17 mm previously with central fatty hilum. Subcarinal lymph node identified previously has decreased from 15 mm short axis previously 211 mm short axis today. No evidence for gross hilar lymphadenopathy although assessment is limited by the lack of intravenous  contrast on today's study. The esophagus has normal imaging features. There is no axillary lymphadenopathy. Lungs/Pleura: 7 mm linear opacity deep to the right first costosternal joint is most likely an area of subpleural scarring. No new suspicious pulmonary nodule or mass. Left upper lobe calcified granuloma evident. Compressive atelectasis noted both lower lobes. Tiny bilateral pleural effusions are decreased in size in the interval. Upper Abdomen: Unremarkable. Musculoskeletal: No worrisome lytic or sclerotic osseous abnormality. IMPRESSION: 1. Stable appearance of the ascending thoracic aorta measuring 4.4 cm distal to the sino-tubular junction. 2. Tiny bilateral pleural effusions, decreased in size in the interval. 3. Interval decrease in borderline mediastinal lymphadenopathy. 4. Aortic Atherosclerosis (ICD10-I70.0). Electronically Signed   By: Misty Stanley M.D.   On: 11/23/2020 13:24     Assessment & Plan:  Plan    No orders of the defined types were placed in this encounter.   Problem List Items Addressed This Visit    Permanent atrial fibrillation [facility])    Follows with cardiology and doing well, rate controlled and tolerating Xarelto, given some samples today.       Hypertension    Well controlled, no changes to meds. Encouraged heart healthy diet such as the DASH diet and exercise as tolerated.       Relevant Orders   CBC   Comprehensive metabolic panel   TSH   Hyperlipidemia    Encouraged heart healthy diet, increase exercise, avoid trans fats, consider a krill oil cap daily      Relevant Orders   Lipid panel   Thiamine deficiency    Supplement and monitor      Relevant Orders   Vitamin B1   Thoracic aortic aneurysm ([place])    Had CT scan yesterday stable disease repeat imaging in 4 months      Hyperglycemia    hgba1c acceptable, minimize simple carbs. Increase exercise as tolerated.       Relevant Orders   Hemoglobin A1c   Prostate cancer [facility])    He is  following with urology and after his PSA continued to trend up they ran biopsies  which confirmed prostate cancer, he is now waiting on a consultation with oncology to discuss next steps he is asymptomatic         Follow-up: Return in about 6 months (around 05/27/2021) for annual exam.  I,Anselm Aumiller,acting as a scribe for [name], MD.,have documented all relevant documentation on the behalf of [name], MD,as directed by  [name], MD while in the presence of [name], MD.  Medical screening examination/treatment was performed by qualified clinical staff member and as supervising physician I was immediately available for consultation/collaboration. I have reviewed documentation and agree with assessment and plan.  [name], MD

## 2020-11-24 NOTE — Assessment & Plan Note (Signed)
Encouraged heart healthy diet, increase exercise, avoid trans fats, consider a krill oil cap daily 

## 2020-11-24 NOTE — Patient Instructions (Signed)
Prostate Cancer  The prostate is a small gland (1.5 inches [3.8 cm] wide and 1 inch [2.5 cm] high) that is involved in the production of semen. It is located below a man's bladder, in front of the rectum. Prostate cancer is the abnormal growth of cells in the prostate gland. What are the causes? The exact cause of this condition is not known. What increases the risk? You are more likely to develop this condition if:  You are 83 years of age or older.  You are African American.  You have a family history of prostate cancer.  You have a family history of breast cancer. What are the signs or symptoms? Symptoms of this condition include:  A need to urinate often.  Weak or interrupted flow of urine.  Trouble starting or stopping urination.  Inability to urinate.  Blood in urine or semen.  Persistent pain or discomfort in the lower back, lower abdomen, hips, or upper thighs.  Trouble getting an erection.  Trouble emptying the bladder all the way. How is this diagnosed? This condition can be diagnosed with:  A digital rectal exam. For this exam, a health care provider inserts a gloved finger into the rectum to feel the prostate gland.  A blood test called a prostate-specific antigen (PSA) test.  A procedure in which a sample of tissue is taken from the prostate and checked under a microscope (prostate biopsy).  An imaging test called transrectal ultrasonography. Once the condition is diagnosed, tests will be done to determine how far the cancer has spread. This is called staging the cancer. Staging may involve imaging tests, such as:  A bone scan.  A CT scan.  A PET scan.  An MRI. The stages of prostate cancer are as follows:  Stage I. At this stage, the cancer is found in the prostate only. The cancer is not visible on imaging tests, and it is usually found by accident, such as during prostate surgery.  Stage II. At this stage, the cancer is more advanced than it is  in stage I, but the cancer has not spread outside the prostate.  Stage III. At this stage, the cancer has spread beyond the outer layer of the prostate to nearby tissues. The cancer may be found in the seminal vesicles, which are near the bladder and the prostate.  Stage IV. At this stage, the cancer has spread to other parts of the body, such as the lymph nodes, bones, bladder, rectum, liver, or lungs. How is this treated? Treatment for this condition depends on several factors, including the stage of the cancer, your age, personal preferences, and your overall health. Talk with your health care provider about treatment options that are recommended for you. Common treatments include:  Observation for early stage prostate cancer (active surveillance). This involves having exams, blood tests, and in some cases, more biopsies. For some men, this is the only treatment needed.  Surgery. Types of surgeries include: ? Open surgery (prostatectomy). In this surgery, a larger incision is made to remove the prostate. ? A laparoscopic prostatectomy. This is a surgery to remove the prostate and lymph nodes through several, small incisions. It is often referred to as a minimally invasive surgery. ? A robotic prostatectomy. This is laparoscopic surgery to remove the prostate and lymph nodes with the help of robotic arms that are controlled by the surgeon. ? Orchiectomy. This is surgery to remove the testicles. ? Cryosurgery. This is surgery to freeze and destroy cancer cells.  Radiation treatment. Types of radiation treatment include: ? External beam radiation. This type aims beams of radiation from outside the body at the prostate to destroy cancerous cells. ? Brachytherapy. This type uses radioactive needles, seeds, wires, or tubes that are implanted into the prostate gland. Like external beam radiation, brachytherapy destroys cancerous cells. An advantage is that this type of radiation limits the damage to  surrounding tissue and has fewer side effects.  High-intensity, focused ultrasonography. This treatment destroys cancer cells by delivering high-energy ultrasound waves to the cancerous cells.  Chemotherapy medicines. This treatment kills cancer cells or stops them from multiplying. It kills both cancer cells and normal cells.  Targeted therapy. This treatment uses medicines to kill cancer cells without damaging normal cells.  Hormone treatment. This treatment involves taking medicines that act on one of the male hormones (testosterone): ? By stopping your body from producing testosterone. ? By blocking testosterone from reaching cancer cells. Follow these instructions at home:  Take over-the-counter and prescription medicines only as told by your health care provider.  Maintain a healthy diet.  Get plenty of sleep.  Consider joining a support group for men who have prostate cancer. Meeting with a support group may help you learn to manage the stress of having cancer.  If you have to go to the hospital, notify your cancer specialist (oncologist).  Treatment for prostate cancer may affect sexual function. Continue to have intimate moments with your partner. This may include touching, holding, hugging, and caressing.  Keep all follow-up visits as told by your health care provider. This is important. Contact a health care provider if:  You have new or increasing trouble urinating.  You have new or increasing blood in your urine.  You have new or increasing pain in your hips, back, or chest. Get help right away if:  You have weakness or numbness in your legs.  You cannot control urination or your bowel movements (incontinence).  You have chills or a fever. Summary  The prostate is a small gland that is involved in the production of semen. It is located below a man's bladder, in front of the rectum.  Prostate cancer is the abnormal growth of cells in the prostate  gland.  Treatment for this condition depends on the stage of the cancer, your age, personal preferences, and your overall health. Talk with your health care provider about treatment options that are recommended for you.  Consider joining a support group for men who have prostate cancer. Meeting with a support group may help you learn to cope with the stress of having cancer. This information is not intended to replace advice given to you by your health care provider. Make sure you discuss any questions you have with your health care provider. Document Revised: 08/06/2019 Document Reviewed: 08/06/2019 Elsevier Patient Education  2021 [employer].

## 2020-11-24 NOTE — Assessment & Plan Note (Signed)
Supplement and monitor 

## 2020-11-24 NOTE — Assessment & Plan Note (Addendum)
Follows with cardiology and doing well, rate controlled and tolerating Xarelto, given some samples today.

## 2020-11-24 NOTE — Assessment & Plan Note (Signed)
Well controlled, no changes to meds. Encouraged heart healthy diet such as the DASH diet and exercise as tolerated.  °

## 2020-11-24 NOTE — Assessment & Plan Note (Signed)
He is following with urology and after his PSA continued to trend up they ran biopsies which confirmed prostate cancer, he is now waiting on a consultation with oncology to discuss next steps he is asymptomatic

## 2020-11-24 NOTE — Assessment & Plan Note (Signed)
hgba1c acceptable, minimize simple carbs. Increase exercise as tolerated.  

## 2020-11-24 NOTE — Assessment & Plan Note (Signed)
Had CT scan yesterday stable disease repeat imaging in 4 months

## 2020-11-25 ENCOUNTER — Other Ambulatory Visit: Payer: Self-pay | Admitting: Family Medicine

## 2020-11-25 ENCOUNTER — Other Ambulatory Visit: Payer: Self-pay

## 2020-11-25 DIAGNOSIS — J342 Deviated nasal septum: Secondary | ICD-10-CM | POA: Diagnosis not present

## 2020-11-25 DIAGNOSIS — J310 Chronic rhinitis: Secondary | ICD-10-CM | POA: Diagnosis not present

## 2020-11-25 DIAGNOSIS — R040 Epistaxis: Secondary | ICD-10-CM | POA: Diagnosis not present

## 2020-11-25 DIAGNOSIS — J343 Hypertrophy of nasal turbinates: Secondary | ICD-10-CM | POA: Diagnosis not present

## 2020-11-27 ENCOUNTER — Other Ambulatory Visit: Payer: Self-pay

## 2020-11-27 DIAGNOSIS — E785 Hyperlipidemia, unspecified: Secondary | ICD-10-CM

## 2020-11-27 DIAGNOSIS — E519 Thiamine deficiency, unspecified: Secondary | ICD-10-CM

## 2020-11-27 LAB — VITAMIN B1: Vitamin B1 (Thiamine): 87 nmol/L — ABNORMAL HIGH (ref 8–30)

## 2020-12-02 ENCOUNTER — Ambulatory Visit (INDEPENDENT_AMBULATORY_CARE_PROVIDER_SITE_OTHER): Payer: PPO

## 2020-12-02 DIAGNOSIS — I495 Sick sinus syndrome: Secondary | ICD-10-CM | POA: Diagnosis not present

## 2020-12-02 LAB — CUP PACEART REMOTE DEVICE CHECK
Battery Remaining Longevity: 113 mo
Battery Remaining Percentage: 81 %
Battery Voltage: 2.93 V
Brady Statistic RV Percent Paced: 12 %
Date Time Interrogation Session: 20220330030623
Implantable Lead Implant Date: 20130528
Implantable Lead Location: 753860
Implantable Lead Model: 1948
Implantable Pulse Generator Implant Date: 20130528
Lead Channel Impedance Value: 660 Ohm
Lead Channel Pacing Threshold Amplitude: 0.5 V
Lead Channel Pacing Threshold Pulse Width: 0.4 ms
Lead Channel Sensing Intrinsic Amplitude: 12 mV
Lead Channel Setting Pacing Amplitude: 2.5 V
Lead Channel Setting Pacing Pulse Width: 0.4 ms
Lead Channel Setting Sensing Sensitivity: 2 mV
Pulse Gen Model: 1210
Pulse Gen Serial Number: 7328888

## 2020-12-07 DIAGNOSIS — B351 Tinea unguium: Secondary | ICD-10-CM | POA: Diagnosis not present

## 2020-12-07 DIAGNOSIS — L84 Corns and callosities: Secondary | ICD-10-CM | POA: Diagnosis not present

## 2020-12-07 DIAGNOSIS — M79672 Pain in left foot: Secondary | ICD-10-CM | POA: Diagnosis not present

## 2020-12-07 DIAGNOSIS — M79671 Pain in right foot: Secondary | ICD-10-CM | POA: Diagnosis not present

## 2020-12-08 ENCOUNTER — Other Ambulatory Visit: Payer: Self-pay | Admitting: Family Medicine

## 2020-12-11 NOTE — Progress Notes (Addendum)
Cardiology Office Note   Date:  12/15/2020   ID:  KEIJUAN SCHELLHASE, DOB 27-Oct-1935, MRN [id number]  PCP:  Mosie Lukes, MD    No chief complaint on file.  Tachy-brady/pacer  Wt Readings from Last 3 Encounters:  12/15/20 219 lb 4.2 oz (99.5 kg)  11/24/20 223 lb 14.4 oz (101.6 kg)  11/23/20 226 lb (102.5 kg)       History of Present Illness: Brian Davidson is a 85 y.o. male   With a pacemaker, followed by Dr. Rayann Heman.He saw Dr. Rayford Halsted 20 years ago for AFib. He had cardioversion many years ago and was treated with Coumadin. THis was later switched to Xarelto.  He is referred to me by EP PA, Oda Kilts for :"I wanted to establish him with gen cards for management of his non EP issues (HTN, carotid artery disease, moderate ascending aorta dilation, and moderate mitral stenosis) since he has been stable from a PPM/AFib standpoint and only requires annual follow up at this time  Echo in 05/2019: "Left ventricular ejection fraction, by visual estimation, is 60 to  65%. The left ventricle has normal function. Normal left ventricular size.  There is no left ventricular hypertrophy.  2. Left ventricular diastolic function could not be evaluated pattern of  LV diastolic filling.  3. Global right ventricle has normal systolic function.The right  ventricular size is normal. No increase in right ventricular wall  thickness.  4. Left atrial size was moderately dilated.  5. Right atrial size was normal.  6. The mitral valve is normal in structure. No evidence of mitral valve  regurgitation. Moderate mitral stenosis.  7. The tricuspid valve is normal in structure. Tricuspid valve  regurgitation is mild.  8. The aortic valve is normal in structure. Aortic valve regurgitation is  mild by color flow Doppler. Mild aortic valve sclerosis without stenosis.  9. The pulmonic valve was normal in structure. Pulmonic valve  regurgitation is not visualized by color flow Doppler.   10. There is moderate dilatation of the ascending aorta measuring 43 mm.  11. Normal pulmonary artery systolic pressure.  12. The inferior vena cava is normal in size with greater than 50%  respiratory variability, suggesting right atrial pressure of 3 mmHg. "  In 2021, he reported :" that when he stands from a sitting position, he gets dizzy. He holds a wall and is ok after a few seconds. Walks 2.5 miles daily.  Exercise tolerance better after pacer. "  Had knee replacement on 4/19.  He had both COVID shots in 2021.   Aortic aneurysm 4.4.  Denies : Chest pain. Dizziness. Leg edema. Nitroglycerin use. Orthopnea. Palpitations. Paroxysmal nocturnal dyspnea. Shortness of breath. Syncope.   He walks several miles daily without problems. Rare dizziness upon standing.         Past Medical History:  Diagnosis Date  . Arthritis   . Biliary dyskinesia   . Carotid artery disease ([place]) 07/01/2016  . Dyslipidemia 07/13/2017  . Dysrhythmia    a fib  . GERD (gastroesophageal reflux disease)    occasional  . H/O measles   . History of chicken pox   . History of kidney stones   . Hypertension   . Neuromuscular disorder ([facility])    neuropathy feet   . Persistent atrial fibrillation ([place])   . Pneumonia 1992  . Presence of permanent cardiac pacemaker   . Tachycardia-bradycardia ([place])    a. s/p STJ dual chamber pacemaker  . Thiamine deficiency  01/19/2018  . Valvular heart disease 07/01/2016    Past Surgical History:  Procedure Laterality Date  . CHOLECYSTECTOMY N/A 11/24/2015   Procedure: LAPAROSCOPIC CHOLECYSTECTOMY;  Surgeon: Greer Pickerel, MD;  Location: [facility];  Service: General;  Laterality: N/A;  . CYSTOSCOPY  2016  . EYE SURGERY     bil cataract  . FRACTURE SURGERY  2005   left ankle  . HERNIA REPAIR     left groin, no mesh  . PERMANENT PACEMAKER INSERTION N/A 01/31/2012   SJM Accent SR RF implanted by DR Allred  . TOTAL KNEE ARTHROPLASTY Right 12/23/2019   Procedure: TOTAL  KNEE ARTHROPLASTY;  Surgeon: Gaynelle Arabian, MD;  Location: [facility];  Service: Orthopedics;  Laterality: Right;  52min     Current Outpatient Medications  Medication Sig Dispense Refill  . Biotin 5000 MCG TABS Take 5,000 mg by mouth daily.    . Calcium Carbonate-Vitamin D (CALTRATE 600+D PO) Take 600 mg by mouth daily.     . Cholecalciferol 50 MCG (2000 UT) TABS Take 2,000 Units by mouth daily.     . Cyanocobalamin (VITAMIN B-12) 5000 MCG TBDP Take by mouth daily.    . diclofenac Sodium (VOLTAREN) 1 % GEL Apply topically daily as needed (Pain).     Marland Kitchen diltiazem (CARDIZEM CD) 240 MG 24 hr capsule Take 1 capsule (240 mg total) by mouth daily. 90 capsule 1  . fluticasone (FLONASE) 50 MCG/ACT nasal spray Place 2 sprays into both nostrils daily. 16 g 1  . gabapentin (NEURONTIN) 300 MG capsule Take 1 capsule by mouth twice daily 180 capsule 0  . irbesartan-hydrochlorothiazide (AVALIDE) 150-12.5 MG tablet Take 0.5 tablets by mouth daily. 45 tablet 1  . magnesium oxide (MAG-OX) 400 MG tablet Take 400 mg by mouth daily.    . metoprolol tartrate (LOPRESSOR) 100 MG tablet Take 1 tablet (100 mg total) by mouth 2 (two) times daily. 180 tablet 1  . rivaroxaban (XARELTO) 20 MG TABS tablet Take 1 tablet (20 mg total) by mouth daily. 90 tablet 1  . thiamine 100 MG tablet Take 100 mg by mouth daily.     . Turmeric 500 MG TABS Take 500 mg by mouth daily.      No current facility-administered medications for this visit.    Allergies:   Cardizem [diltiazem] and Sulfa antibiotics    Social History:  The patient  reports that he quit smoking about 35 years ago. His smoking use included pipe and cigars. He quit after 10.00 years of use. He has never used smokeless tobacco. He reports current alcohol use of about 3.0 standard drinks of alcohol per week. He reports that he does not use drugs.   Family History:  The patient's family history includes Arthritis in his mother; Atrial fibrillation in his brother and  sister; Drug abuse in his mother; Heart disease in his brother, father, and sister; Heart disease (age of onset: 23) in his sister; Hypertension in his brother, mother, and son; Neuropathy in his brother; Stroke in his father.    ROS:  Please see the history of present illness.   Otherwise, review of systems are positive for recent prostate cancer diagnosis.   All other systems are reviewed and negative.    PHYSICAL EXAM: VS:  BP 118/64   Pulse 63   Ht 6' (1.829 m)   Wt 219 lb 4.2 oz (99.5 kg)   SpO2 97%   BMI 29.74 kg/m  , BMI Body mass index is 29.74 kg/m. GEN:  Well nourished, well developed, in no acute distress  HEENT: normal  Neck: no JVD, carotid bruits, or masses Cardiac:irregularly irregular; 2/6 diastolic murmur, no rubs, or gallops,no edema  Respiratory:  clear to auscultation bilaterally, normal work of breathing GI: soft, nontender, nondistended, + BS MS: no deformity or atrophy  Skin: warm and dry, no rash; varicose veins Neuro:  Strength and sensation are intact Psych: euthymic mood, full affect   EKG:   The ekg ordered 11/21 demonstrates AFib, rate controlled   Recent Labs: 11/24/2020: ALT 29; BUN 30; Creatinine, Ser 1.22; Hemoglobin 14.5; Platelets 169.0; Potassium 3.8; Sodium 139; TSH 2.01   Lipid Panel    Component Value Date/Time   CHOL 174 11/24/2020 1047   TRIG 158.0 (H) 11/24/2020 1047   HDL 42.10 11/24/2020 1047   CHOLHDL 4 11/24/2020 1047   VLDL 31.6 11/24/2020 1047   LDLCALC 100 (H) 11/24/2020 1047   LDLCALC 107 (H) 05/26/2020 1103     Other studies Reviewed: Additional studies/ records that were reviewed today with results demonstrating: labs reviewed.   ASSESSMENT AND PLAN:  1. Dilated aortic root: Followed by CT surgery- Dr. Orvan Seen.  Stable 4.4 cm. 2. Carotid disease: Mild in 2020.  3. Mitral stenosis: moderate in 2020. No CHF.  4. Hyperlipidemia: LDL 100.  See below, given evidence of atherosclerosis on CT. 5. HTN: The current  medical regimen is effective;  continue present plan and medications.  Low salt diet. 6. Anticoagulated: Xarelto for stroke prevention.  7. Pacer: Followed by EP.  8. PreDM: A1C 5.7. healthy diet.  9. Aortic atherosclerosis.  Start rosuvastatin 10 mg daily. I think he will have LFTs in the next few months at the cancer center.    Current medicines are reviewed at length with the patient today.  The patient concerns regarding his medicines were addressed.  The following changes have been made:  Add statin.  Labs/ tests ordered today include:  No orders of the defined types were placed in this encounter.   Recommend 150 minutes/week of aerobic exercise Low fat, low carb, high fiber diet recommended  Disposition:   FU in 1 year   Signed, Larae Grooms, MD  12/15/2020 9:14 AM    [facility] [address] [place], [place], [place]  [address] Phone: [phone]; Fax: [phone]

## 2020-12-15 ENCOUNTER — Other Ambulatory Visit: Payer: Self-pay

## 2020-12-15 ENCOUNTER — Encounter: Payer: Self-pay | Admitting: Interventional Cardiology

## 2020-12-15 ENCOUNTER — Ambulatory Visit: Payer: PPO | Admitting: Interventional Cardiology

## 2020-12-15 VITALS — BP 118/64 | HR 63 | Ht 72.0 in | Wt 219.3 lb

## 2020-12-15 DIAGNOSIS — Z950 Presence of cardiac pacemaker: Secondary | ICD-10-CM | POA: Diagnosis not present

## 2020-12-15 DIAGNOSIS — I059 Rheumatic mitral valve disease, unspecified: Secondary | ICD-10-CM

## 2020-12-15 DIAGNOSIS — I10 Essential (primary) hypertension: Secondary | ICD-10-CM | POA: Diagnosis not present

## 2020-12-15 DIAGNOSIS — I779 Disorder of arteries and arterioles, unspecified: Secondary | ICD-10-CM

## 2020-12-15 DIAGNOSIS — I77810 Thoracic aortic ectasia: Secondary | ICD-10-CM | POA: Diagnosis not present

## 2020-12-15 DIAGNOSIS — E782 Mixed hyperlipidemia: Secondary | ICD-10-CM

## 2020-12-15 DIAGNOSIS — Z7901 Long term (current) use of anticoagulants: Secondary | ICD-10-CM | POA: Diagnosis not present

## 2020-12-15 MED ORDER — ROSUVASTATIN CALCIUM 10 MG PO TABS: 10.0000 mg | ORAL_TABLET | Freq: Every day | ORAL | 3 refills | Status: DC

## 2020-12-15 NOTE — Patient Instructions (Addendum)
Medication Instructions:  Your physician has recommended you make the following change in your medication:  Start Rosuvastatin 10 mg by mouth daily  *If you need a refill on your cardiac medications before your next appointment, please call your pharmacy*   Lab Work: none If you have labs (blood work) drawn today and your tests are completely normal, you will receive your results only by: Marland Kitchen MyChart Message (if you have MyChart) OR . A paper copy in the mail If you have any lab test that is abnormal or we need to change your treatment, we will call you to review the results.   Testing/Procedures: none   Follow-Up: At [facility], you and your health needs are our priority.  As part of our continuing mission to provide you with exceptional heart care, we have created designated Provider Care Teams.  These Care Teams include your primary Cardiologist (physician) and Advanced Practice Providers (APPs -  Physician Assistants and Nurse Practitioners) who all work together to provide you with the care you need, when you need it.  We recommend signing up for the patient portal called "MyChart".  Sign up information is provided on this After Visit Summary.  MyChart is used to connect with patients for Virtual Visits (Telemedicine).  Patients are able to view lab/test results, encounter notes, upcoming appointments, etc.  Non-urgent messages can be sent to your provider as well.   To learn more about what you can do with MyChart, go to [email or web address].    Your next appointment:   12 month(s)  The format for your next appointment:   In Person  Provider:   You may see Larae Grooms, MD or one of the following Advanced Practice Providers on your designated Care Team:    Melina Copa, PA-C  Ermalinda Barrios, PA-C    Other Instructions   High-Fiber Eating Plan Fiber, also called dietary fiber, is a type of carbohydrate. It is found foods such as fruits, vegetables, whole  grains, and beans. A high-fiber diet can have many health benefits. Your health care provider may recommend a high-fiber diet to help:  Prevent constipation. Fiber can make your bowel movements more regular.  Lower your cholesterol.  Relieve the following conditions: ? Inflammation of veins in the anus (hemorrhoids). ? Inflammation of specific areas of the digestive tract (uncomplicated diverticulosis). ? A problem of the large intestine, also called the colon, that sometimes causes pain and diarrhea (irritable bowel syndrome, or IBS).  Prevent overeating as part of a weight-loss plan.  Prevent heart disease, type 2 diabetes, and certain cancers. What are tips for following this plan? Reading food labels  Check the nutrition facts label on food products for the amount of dietary fiber. Choose foods that have 5 grams of fiber or more per serving.  The goals for recommended daily fiber intake include: ? Men (age 52 or younger): 34-38 g. ? Men (over age 32): 28-34 g. ? Women (age 11 or younger): 25-28 g. ? Women (over age 25): 22-25 g. Your daily fiber goal is _____________ g.   Shopping  Choose whole fruits and vegetables instead of processed forms, such as apple juice or applesauce.  Choose a wide variety of high-fiber foods such as avocados, lentils, oats, and kidney beans.  Read the nutrition facts label of the foods you choose. Be aware of foods with added fiber. These foods often have high sugar and sodium amounts per serving. Cooking  Use whole-grain flour for baking and cooking.  Cook with  brown rice instead of white rice. Meal planning  Start the day with a breakfast that is high in fiber, such as a cereal that contains 5 g of fiber or more per serving.  Eat breads and cereals that are made with whole-grain flour instead of refined flour or white flour.  Eat brown rice, bulgur wheat, or millet instead of white rice.  Use beans in place of meat in soups, salads, and  pasta dishes.  Be sure that half of the grains you eat each day are whole grains. General information  You can get the recommended daily intake of dietary fiber by: ? Eating a variety of fruits, vegetables, grains, nuts, and beans. ? Taking a fiber supplement if you are not able to take in enough fiber in your diet. It is better to get fiber through food than from a supplement.  Gradually increase how much fiber you consume. If you increase your intake of dietary fiber too quickly, you may have bloating, cramping, or gas.  Drink plenty of water to help you digest fiber.  Choose high-fiber snacks, such as berries, raw vegetables, nuts, and popcorn. What foods should I eat? Fruits Berries. Pears. Apples. Oranges. Avocado. Prunes and raisins. Dried figs. Vegetables Sweet potatoes. Spinach. Kale. Artichokes. Cabbage. Broccoli. Cauliflower. Green peas. Carrots. Squash. Grains Whole-grain breads. Multigrain cereal. Oats and oatmeal. Brown rice. Barley. Bulgur wheat. [place]. Quinoa. Bran muffins. Popcorn. Rye wafer crackers. Meats and other proteins Navy beans, kidney beans, and pinto beans. Soybeans. Split peas. Lentils. Nuts and seeds. Dairy Fiber-fortified yogurt. Beverages Fiber-fortified soy milk. Fiber-fortified orange juice. Other foods Fiber bars. The items listed above may not be a complete list of recommended foods and beverages. Contact a dietitian for more information. What foods should I avoid? Fruits Fruit juice. Cooked, strained fruit. Vegetables Fried potatoes. Canned vegetables. Well-cooked vegetables. Grains White bread. Pasta made with refined flour. White rice. Meats and other proteins Fatty cuts of meat. Fried chicken or fried fish. Dairy Milk. Yogurt. Cream cheese. Sour cream. Fats and oils Butters. Beverages Soft drinks. Other foods Cakes and pastries. The items listed above may not be a complete list of foods and beverages to avoid. Talk with your  dietitian about what choices are best for you. Summary  Fiber is a type of carbohydrate. It is found in foods such as fruits, vegetables, whole grains, and beans.  A high-fiber diet has many benefits. It can help to prevent constipation, lower blood cholesterol, aid weight loss, and reduce your risk of heart disease, diabetes, and certain cancers.  Increase your intake of fiber gradually. Increasing fiber too quickly may cause cramping, bloating, and gas. Drink plenty of water while you increase the amount of fiber you consume.  The best sources of fiber include whole fruits and vegetables, whole grains, nuts, seeds, and beans. This information is not intended to replace advice given to you by your health care provider. Make sure you discuss any questions you have with your health care provider. Document Revised: 12/26/2019 Document Reviewed: 12/26/2019 Elsevier Patient Education  2021 [employer].

## 2020-12-15 NOTE — Progress Notes (Signed)
Remote pacemaker transmission.   

## 2020-12-18 DIAGNOSIS — Z96651 Presence of right artificial knee joint: Secondary | ICD-10-CM | POA: Diagnosis not present

## 2020-12-22 ENCOUNTER — Other Ambulatory Visit: Payer: Self-pay

## 2020-12-22 ENCOUNTER — Ambulatory Visit
Admission: RE | Admit: 2020-12-22 | Discharge: 2020-12-22 | Disposition: A | Discharge status: Home / Self Care | Payer: PPO | Source: Ambulatory Visit | Attending: Radiation Oncology | Admitting: Radiation Oncology

## 2020-12-22 ENCOUNTER — Encounter: Payer: Self-pay | Admitting: Medical Oncology

## 2020-12-22 ENCOUNTER — Encounter: Payer: Self-pay | Admitting: Radiation Oncology

## 2020-12-22 VITALS — BP 138/79 | HR 74 | Temp 96.8°F | Resp 18 | Ht 72.0 in | Wt 227.0 lb

## 2020-12-22 DIAGNOSIS — K219 Gastro-esophageal reflux disease without esophagitis: Secondary | ICD-10-CM | POA: Insufficient documentation

## 2020-12-22 DIAGNOSIS — Z87891 Personal history of nicotine dependence: Secondary | ICD-10-CM | POA: Diagnosis not present

## 2020-12-22 DIAGNOSIS — Z7901 Long term (current) use of anticoagulants: Secondary | ICD-10-CM | POA: Insufficient documentation

## 2020-12-22 DIAGNOSIS — R000 Tachycardia, unspecified: Secondary | ICD-10-CM | POA: Insufficient documentation

## 2020-12-22 DIAGNOSIS — E785 Hyperlipidemia, unspecified: Secondary | ICD-10-CM | POA: Diagnosis not present

## 2020-12-22 DIAGNOSIS — I480 Paroxysmal atrial fibrillation: Secondary | ICD-10-CM | POA: Insufficient documentation

## 2020-12-22 DIAGNOSIS — M129 Arthropathy, unspecified: Secondary | ICD-10-CM | POA: Insufficient documentation

## 2020-12-22 DIAGNOSIS — C61 Malignant neoplasm of prostate: Secondary | ICD-10-CM | POA: Diagnosis not present

## 2020-12-22 DIAGNOSIS — Z79899 Other long term (current) drug therapy: Secondary | ICD-10-CM | POA: Insufficient documentation

## 2020-12-22 HISTORY — DX: Malignant neoplasm of prostate: C61

## 2020-12-22 NOTE — Progress Notes (Signed)
Introduced myself to patient and his wife as the prostate nurse navigator and discussed my role. No barriers to care identified at this time. He is interested in radiation to treat his prostate cancer. We discussed the process of gold markers, CT simulation and radiation. I gave him my business card and asked him to contact me with questions or concerns. He voiced understanding.

## 2020-12-22 NOTE — Progress Notes (Signed)
GU Location of Tumor / Histology: prostatic adenocarcinoma  If Prostate Cancer, Gleason Score is (4 + 3) and PSA is (5.98). Prostate volume: 28.58 grams    Biopsies of prostate (if applicable) revealed:    Past/Anticipated interventions by urology, if any: prostate biopsy, CT abd/pelvis (negative), referral to Dr. Tammi Klippel to discuss radiation therapy options  Past/Anticipated interventions by medical oncology, if any: no  Weight changes, if any: denies  Bowel/Bladder complaints, if any: IPSS 8. SHIM 5. Denies urinary leakage or incontinence. Denies dysuria or hematuria. Denies any bowel complaints.  Nausea/Vomiting, if any: denies  Pain issues, if any:  Yes, related to bilateral neuropathy in feet. Cause of neuropathy unknown. Denies new pain.  SAFETY ISSUES:  Prior radiation? no  Pacemaker/ICD? yes  Possible current pregnancy? no, male patient  Is the patient on methotrexate? no  Current Complaints / other details:  85 year old male. Married. Father - prostate cancer.

## 2020-12-22 NOTE — Progress Notes (Signed)
Radiation Oncology         [phone] ________________________________  Initial outpatient Consultation  Name: Brian Davidson MRN: [id number]  Date: 12/22/2020  DOB: 1935-09-18  MW:NUUVO, Bonnita Levan, MD  Festus Aloe, MD   REFERRING PHYSICIAN: Festus Aloe, MD  DIAGNOSIS: 85 y.o. gentleman with Stage T2a adenocarcinoma of the prostate with Gleason score of 4+3, and PSA of 5.98.    ICD-10-CM   1. Malignant neoplasm of prostate ([facility])  C61     HISTORY OF PRESENT ILLNESS: Brian Davidson is a 85 y.o. male with a diagnosis of prostate cancer. He is an established urology patient, initially seen for an elevated PSA and palpable prostate nodule in 02/2016. Subsequent CT scan revealed a large calcification correlating with the prostate nodule so no further workup was recommended at that time. He continued monitoring the PSA with his PCP noting some fluctuation in the PSA between 2019 - 11/2018 but steadily rising more recently. He was referred back to Dr. Junious Silk in 12/2018 with PSA at 3.95 and was found to have a new prostate nodule in the right apex, midline on DRE. Since that time, the PSA has steadily risen at 4.51 in 06/2019, 5.53 in 12/2019 and most recently at 5.98 in 06/2020. The patient proceeded to transrectal ultrasound with 12 biopsies of the prostate on 10/07/20,  digital rectal examination was performed at that time revealing stable 5 mm right mid and midline nodules.  The prostate volume measured 28.58 cc.  Out of 12 core biopsies, 10 were positive.  The maximum Gleason score was 4+3, and this was seen in the left base lateral (with perineural invasion), left mid lateral (with PNI), left base (with PNI), left mid (with PNI), and left apex. Additionally, Gleason 3+4 was seen in the right mid (with PNI), right apex (with PNI), right mid lateral, and right apex lateral (with PNI), and Gleason 3+3 in the right base.  He underwent CT A/P on 11/12/20 showing no nodal disease or signs of  bony metastatic lesions. Bone scan that same day did show some uptake in bilateral anterior ribs and a right posterior 11th rib, non-specific. A chest CT on 11/23/20 was without evidence of osseous metastatic lesions.  The patient reviewed the biopsy results with his urologist and he has kindly been referred today for discussion of potential radiation treatment options.   PREVIOUS RADIATION THERAPY: No  PAST MEDICAL HISTORY:  Past Medical History:  Diagnosis Date  . Arthritis   . Biliary dyskinesia   . Carotid artery disease ([place]) 07/01/2016  . Dyslipidemia 07/13/2017  . Dysrhythmia    a fib  . GERD (gastroesophageal reflux disease)    occasional  . H/O measles   . History of chicken pox   . History of kidney stones   . Hypertension   . Neuromuscular disorder ([facility])    neuropathy feet   . Persistent atrial fibrillation ([place])   . Pneumonia 1992  . Presence of permanent cardiac pacemaker   . Prostate cancer ([place])   . Tachycardia-bradycardia ([place])    a. s/p STJ dual chamber pacemaker  . Thiamine deficiency 01/19/2018  . Valvular heart disease 07/01/2016      PAST SURGICAL HISTORY: Past Surgical History:  Procedure Laterality Date  . CHOLECYSTECTOMY N/A 11/24/2015   Procedure: LAPAROSCOPIC CHOLECYSTECTOMY;  Surgeon: Greer Pickerel, MD;  Location: [facility];  Service: General;  Laterality: N/A;  . CYSTOSCOPY  2016  . EYE SURGERY     bil cataract  . FRACTURE  SURGERY  2005   left ankle  . HERNIA REPAIR     left groin, no mesh  . PERMANENT PACEMAKER INSERTION N/A 01/31/2012   SJM Accent SR RF implanted by DR Allred  . TOTAL KNEE ARTHROPLASTY Right 12/23/2019   Procedure: TOTAL KNEE ARTHROPLASTY;  Surgeon: Gaynelle Arabian, MD;  Location: [facility];  Service: Orthopedics;  Laterality: Right;  86min    FAMILY HISTORY:  Family History  Problem Relation Age of Onset  . Hypertension Mother   . Arthritis Mother   . Drug abuse Mother   . Stroke Father   . Heart disease Father   . Prostate  cancer Father        not treated  . Heart disease Sister 2  . Heart disease Brother   . Hypertension Son   . Heart disease Sister   . Neuropathy Brother   . Hypertension Brother   . Atrial fibrillation Brother   . Atrial fibrillation Sister   . Colon cancer Neg Hx     SOCIAL HISTORY:  Social History   Socioeconomic History  . Marital status: Married    Spouse name: [name]  . Number of children: 1  . Years of education: Not on file  . Highest education level: Not on file  Occupational History  . Occupation: Retired  . 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 Smoking status: Former Smoker    Years: 10.00    Types: Pipe, Cigars    Quit date: 11/18/1985    Years since quitting: 35.1  . Smokeless tobacco: Never Used  Vaping Use  . Vaping Use: Never used  Substance and Sexual Activity  . Alcohol use: Yes    Alcohol/week: 3.0 standard drinks    Types: 3 Cans of beer per week    Comment: occ  . Drug use: No  . Sexual activity: Not Currently  Other Topics Concern  . Not on file  Social History Narrative   Lives with wife, still works part-time as a Geophysicist/field seismologist. He is active around the house and yard...   Parents are deceased and did not have cardiac issues but he has 2 siblings with atrial fibrillation and a sister-in-law  has a pacemaker.   Social Determinants of Health   Financial Resource Strain: Not on file  Food Insecurity: Not on file  Transportation Needs: Not on file  Physical Activity: Sufficiently Active  . Days of Exercise per Week: 7 days  . Minutes of Exercise per Session: 30 min  Stress: Not on file  Social Connections: Not on file  Intimate Partner Violence: Not on file    ALLERGIES: Cardizem [diltiazem] and Sulfa antibiotics  MEDICATIONS:  Current Outpatient Medications  Medication Sig Dispense Refill  . Biotin 5000 MCG TABS Take 5,000 mg by mouth daily.    . Calcium Carbonate-Vitamin D (CALTRATE 600+D PO) Take 600 mg by mouth  daily.     . Cholecalciferol 50 MCG (2000 UT) TABS Take 2,000 Units by mouth daily.     . Cyanocobalamin (VITAMIN B-12) 5000 MCG TBDP Take by mouth daily. Reports taking every other day    . diltiazem (CARDIZEM CD) 240 MG 24 hr capsule Take 1 capsule (240 mg total) by mouth daily. 90 capsule 1  . gabapentin (NEURONTIN) 300 MG capsule Take 1 capsule by mouth twice daily 180 capsule 0  . irbesartan-hydrochlorothiazide (AVALIDE) 150-12.5 MG tablet Take 0.5 tablets by mouth daily. 45 tablet 1  . magnesium oxide (MAG-OX) 400 MG  tablet Take 400 mg by mouth daily.    . metoprolol tartrate (LOPRESSOR) 100 MG tablet Take 1 tablet (100 mg total) by mouth 2 (two) times daily. 180 tablet 1  . rivaroxaban (XARELTO) 20 MG TABS tablet Take 1 tablet (20 mg total) by mouth daily. 90 tablet 1  . rosuvastatin (CRESTOR) 10 MG tablet Take 1 tablet (10 mg total) by mouth daily. 90 tablet 3  . thiamine 100 MG tablet Take 100 mg by mouth daily.     . Turmeric 500 MG TABS Take 500 mg by mouth daily.      No current facility-administered medications for this encounter.    REVIEW OF SYSTEMS:  On review of systems, the patient reports that he is doing well overall. He denies any chest pain, shortness of breath, cough, fevers, chills, night sweats, unintended weight changes. He denies any bowel disturbances, and denies abdominal pain, nausea or vomiting. He denies any new musculoskeletal or joint aches or pains. His IPSS was 8, indicating mild urinary symptoms. His SHIM was 5, indicating he has severe erectile dysfunction. A complete review of systems is obtained and is otherwise negative.    PHYSICAL EXAM:  Wt Readings from Last 3 Encounters:  12/22/20 227 lb (103 kg)  12/15/20 219 lb 4.2 oz (99.5 kg)  11/24/20 223 lb 14.4 oz (101.6 kg)   Temp Readings from Last 3 Encounters:  12/22/20 (!) 96.8 F (36 C) (Temporal)  11/24/20 (!) 97.5 F (36.4 C)  11/03/20 98.6 F (37 C)   BP Readings from Last 3 Encounters:   12/22/20 138/79  12/15/20 118/64  11/24/20 (!) 142/72   Pulse Readings from Last 3 Encounters:  12/22/20 74  12/15/20 63  11/24/20 68    /10  In general this is a well appearing Caucasian male in no acute distress. He's alert and oriented x4 and appropriate throughout the examination. Cardiopulmonary assessment is negative for acute distress, and he exhibits normal effort.     KPS = 100  100 - Normal; no complaints; no evidence of disease. 90   - Able to carry on normal activity; minor signs or symptoms of disease. 80   - Normal activity with effort; some signs or symptoms of disease. 68   - Cares for self; unable to carry on normal activity or to do active work. 60   - Requires occasional assistance, but is able to care for most of his personal needs. 50   - Requires considerable assistance and frequent medical care. 43   - Disabled; requires special care and assistance. 77   - Severely disabled; hospital admission is indicated although death not imminent. 30   - Very sick; hospital admission necessary; active supportive treatment necessary. 10   - Moribund; fatal processes progressing rapidly. 0     - Dead  Karnofsky DA, Abelmann Vass, Craver LS and Burchenal JH [phone]) The use of the nitrogen mustards in the palliative treatment of carcinoma: with particular reference to bronchogenic carcinoma Cancer 1 634-56  LABORATORY DATA:  Lab Results  Component Value Date   WBC 10.4 11/24/2020   HGB 14.5 11/24/2020   HCT 43.2 11/24/2020   MCV 90.4 11/24/2020   PLT 169.0 11/24/2020   Lab Results  Component Value Date   NA 139 11/24/2020   K 3.8 11/24/2020   CL 104 11/24/2020   CO2 26 11/24/2020   Lab Results  Component Value Date   ALT 29 11/24/2020   AST 20 11/24/2020   ALKPHOS 66  11/24/2020   BILITOT 0.8 11/24/2020     RADIOGRAPHY: CUP PACEART REMOTE DEVICE CHECK  Result Date: 12/02/2020 Scheduled remote reviewed. Normal device function.  Next remote 91 days. HB      IMPRESSION/PLAN: 1. 85 y.o. gentleman with Stage T2a adenocarcinoma of the prostate with Gleason Score of 4+3, and PSA of 5.98. We discussed the patient's workup and outlined the nature of prostate cancer in this setting. The patient's T stage, Gleason's score, and PSA put him into the unfavorable intermediate risk group. Accordingly, he is eligible for a variety of potential treatment options including brachytherapy, 5.5 weeks of external radiation, or prostatectomy. We discussed the available radiation techniques, and focused on the details and logistics of delivery. Given his advanced age and heart disease, he is not felt to be an ideal surgical candidate. We discussed and outlined the risks, benefits, short and long-term effects associated with radiotherapy and compared and contrasted these with prostatectomy. We discussed the role of SpaceOAR gel in reducing the rectal toxicity associated with radiotherapy as well as the potential role for ADT in the treatment of unfavorable intermediate risk prostate cancer.  However, given his advanced age and heart disease, it is felt that the negative impact on his quality of life secondary to side effects as well as risks associated with heart disease, outweigh the benefits of therapy. He and his wife were encouraged to ask questions that were answered to their stated satisfaction.  At the conclusion of our conversation, the patient is interested in moving forward with 5.5 weeks of external beam therapy without ADT, reserving ADT for future use if the PSA were to continue to rise despite treatment. We will share our discussion with Dr. Junious Silk and make arrangements for fiducial markers and SpaceOAR gel placement, first available, prior to simulation, to reduce rectal toxicity from radiotherapy. The patient appears to have a good understanding of his disease and our treatment recommendations which are of curative intent and is in agreement with the stated plan.   Therefore, we will move forward with treatment planning accordingly, in anticipation of beginning IMRT in the near future.  We spent 60 minutes face to face with the patient and more than 50% of that time was spent in counseling and/or coordination of care.    Nicholos Johns, PA-C    Tyler Pita, MD  [facility] Direct Dial: [phone]  Fax: [phone] [email or web address]  Skype  LinkedIn   This document serves as a record of services personally performed by Tyler Pita, MD and Freeman Caldron, PA-C. It was created on their behalf by Wilburn Mylar, a trained medical scribe. The creation of this record is based on the scribe's personal observations and the provider's statements to them. This document has been checked and approved by the attending provider.

## 2020-12-28 ENCOUNTER — Telehealth: Payer: Self-pay | Admitting: *Deleted

## 2020-12-28 ENCOUNTER — Encounter: Payer: Self-pay | Admitting: Medical Oncology

## 2020-12-28 NOTE — Progress Notes (Signed)
Pt called and left a message earlier this morning asking for an update on appointments. I returned his call to confirm he received appointments from [facility]. He states he did and he was very appreciative of the follow up call.

## 2020-12-28 NOTE — Telephone Encounter (Signed)
CALLED PATIENT TO INFORM OF SIM APPT. FOR 01-15-21 - ARRIVAL TIME- 2:45 PM @ DR. MANNING'S OFFICE, SPOKE WITH PATIENT AND HE IS AWARE OF THIS APPT.

## 2020-12-30 ENCOUNTER — Telehealth: Payer: Self-pay | Admitting: Interventional Cardiology

## 2020-12-30 NOTE — Telephone Encounter (Signed)
I spoke with patient.  He reports feeling lightheaded every few days.  This is not new. He discussed with Dr Irish Lack at last office visit.  Patient feels it is getting worse.  Occurred the last 2 evenings.  He had to hold onto the wall.  Also occurred while walking the other day.  He returned to his truck and felt better once he sat down.  Not usually related to position changes.  States he started checking BP more often yesterday and today due to lightheadedness.  Last reading today was 120/62, pulse 61. No chest pain or shortness of breath but does feel lack of energy after episode. If not feeling lightheaded he is able to walk and do yard work without problems. He reports he tries to drink water but states he is probably not drinking enough. Patient concerned about lightheadedness and possibly falling.  I advised him to continue to try to stay hydrated and change positions slowly.  I asked him to not check BP so frequently but to try and check in the morning and after taking AM medications. Will forward to Dr Irish Lack for review/recommendations.

## 2020-12-30 NOTE — Telephone Encounter (Signed)
STAT if patient feels like he/she is going to faint   1) Are you dizzy now? No because pt is sitting down right now  2) Do you feel faint or have you passed out? No  3) Do you have any other symptoms? No  4) Have you checked your HR and BP (record if available)?    8pm last night 111/59 HR 66 8:30am today 166/87 HR 84 9:30am today 141/68 HR62 11:15am today 110/57 HR 72  Pt c/o BP issue: STAT if pt c/o blurred vision, one-sided weakness or slurred speech  1. What are your last 5 BP readings? See above  2. Are you having any other symptoms (ex. Dizziness, headache, blurred vision, passed out)? Lightheadedness  3. What is your BP issue? Pt feels like the above BP readings are too high for him.

## 2020-12-30 NOTE — Telephone Encounter (Signed)
Staywell hydrated.  He needs a pacer check as well to make sure it is functioning properly.  For now can decrease irbesartan/HCTZ pill to a half tablet daily and monitor BP

## 2020-12-31 ENCOUNTER — Telehealth: Payer: Self-pay | Admitting: Emergency Medicine

## 2020-12-31 NOTE — Telephone Encounter (Signed)
Contacted patient about sending a remote transmission. Patient given steps, waiting on remote transmission.

## 2020-12-31 NOTE — Telephone Encounter (Signed)
Yes. OK to stop irbesartan/HCTZ for now. Monitor BP.

## 2020-12-31 NOTE — Telephone Encounter (Signed)
Patient had a question about his Cardizem. Patient wanted to know if he could start taking his Cardizem at night instead of the morning in relation to medication causing dizziness.

## 2020-12-31 NOTE — Telephone Encounter (Signed)
Call returned to Pt.  Advised to stop irbesartan-HCTZ and monitor blood pressure.  Pt will keep a BP log.

## 2020-12-31 NOTE — Telephone Encounter (Signed)
Discussed taking medication at night rather than in the morning. Advised to change by moving medication 3-4 hours from dose time the day before until he gets to the new time that is wanted. Patient verbalized understanding.

## 2020-12-31 NOTE — Telephone Encounter (Signed)
No transmission was received. Called patient to assist with sending a manual transmission. Transmission reviewed and appears VS/VP (irregular), rates are controlled. Patient has hx of AF, + Ver[place] xarelto. No events noted on device. Lead parameters are WNL.

## 2020-12-31 NOTE — Telephone Encounter (Signed)
Forwarded to device clinic for remote check.  Pt currently on 1/2 tablet daily of irbesartan-HCTZ.  Will clarify with Dr. Clayton Bibles

## 2021-01-01 NOTE — Telephone Encounter (Signed)
OK to change timing of cardizem.  JV

## 2021-01-05 ENCOUNTER — Telehealth: Payer: Self-pay | Admitting: Interventional Cardiology

## 2021-01-05 NOTE — Telephone Encounter (Signed)
*  STAT* If patient is at the pharmacy, call can be transferred to refill team.   1. Which medications need to be refilled? (please list name of each medication and dose if known) rosuvastatin 10 mg  2. Which pharmacy/location (including street and city if local pharmacy) is medication to be sent to? [facility]   3. Do they need a 30 day or 90 day supply? Patient needs a 90 day not 30 day

## 2021-01-05 NOTE — Telephone Encounter (Signed)
I called pt to make him aware that this RX was sent in on 12/15/20 for #90 w/ 3 refills. He will call his pharmacy back and try to get the #90.

## 2021-01-12 DIAGNOSIS — C61 Malignant neoplasm of prostate: Secondary | ICD-10-CM | POA: Diagnosis not present

## 2021-01-14 ENCOUNTER — Telehealth: Payer: Self-pay | Admitting: *Deleted

## 2021-01-14 NOTE — Progress Notes (Signed)
  Radiation Oncology         [phone] ________________________________  Name: Brian Davidson MRN: [id number]  Date: 01/15/2021  DOB: [birth year]  SIMULATION AND TREATMENT PLANNING NOTE    ICD-10-CM   1. Malignant neoplasm of prostate ([place])  C61     DIAGNOSIS:  85 y.o. gentleman with Stage T2a adenocarcinoma of the prostate with Gleason score of 4+3, and PSA of 5.98.  NARRATIVE:  The patient was brought to the [facility].  Identity was confirmed.  All relevant records and images related to the planned course of therapy were reviewed.  The patient freely provided informed written consent to proceed with treatment after reviewing the details related to the planned course of therapy. The consent form was witnessed and verified by the simulation staff.  Then, the patient was set-up in a stable reproducible supine position for radiation therapy.  A vacuum lock pillow device was custom fabricated to position his legs in a reproducible immobilized position.  Then, I performed a urethrogram under sterile conditions to identify the prostatic apex.  CT images were obtained.  Surface markings were placed.  The CT images were loaded into the planning software.  Then the prostate target and avoidance structures including the rectum, bladder, bowel and hips were contoured.  Treatment planning then occurred.  The radiation prescription was entered and confirmed.  A total of one complex treatment devices was fabricated. I have requested : Intensity Modulated Radiotherapy (IMRT) is medically necessary for this case for the following reason:  Rectal sparing.Marland Kitchen  PLAN:  The patient will receive 70 Gy in 28 fractions.  ________________________________  Sheral Apley Tammi Klippel, M.D.

## 2021-01-14 NOTE — Telephone Encounter (Signed)
CALLED PATIENT TO REMIND OF SIM APPT. FOR 01-15-21- ARRIVAL TIME- 2:15 PM @ [facility], LVM FOR A RETURN CALL

## 2021-01-15 ENCOUNTER — Other Ambulatory Visit: Payer: Self-pay

## 2021-01-15 ENCOUNTER — Ambulatory Visit
Admission: RE | Admit: 2021-01-15 | Discharge: 2021-01-15 | Disposition: A | Discharge status: Home / Self Care | Payer: PPO | Source: Ambulatory Visit | Attending: Radiation Oncology | Admitting: Radiation Oncology

## 2021-01-15 DIAGNOSIS — C61 Malignant neoplasm of prostate: Secondary | ICD-10-CM

## 2021-01-15 DIAGNOSIS — Z510 Encounter for antineoplastic radiation therapy: Secondary | ICD-10-CM | POA: Diagnosis not present

## 2021-01-15 DIAGNOSIS — R3911 Hesitancy of micturition: Secondary | ICD-10-CM | POA: Diagnosis not present

## 2021-01-18 DIAGNOSIS — C61 Malignant neoplasm of prostate: Secondary | ICD-10-CM | POA: Diagnosis not present

## 2021-01-18 DIAGNOSIS — Z510 Encounter for antineoplastic radiation therapy: Secondary | ICD-10-CM | POA: Diagnosis not present

## 2021-01-19 ENCOUNTER — Telehealth: Payer: Self-pay | Admitting: *Deleted

## 2021-01-19 ENCOUNTER — Ambulatory Visit (INDEPENDENT_AMBULATORY_CARE_PROVIDER_SITE_OTHER): Payer: PPO | Admitting: Pharmacist

## 2021-01-19 DIAGNOSIS — I712 Thoracic aortic aneurysm, without rupture, unspecified: Secondary | ICD-10-CM

## 2021-01-19 DIAGNOSIS — G6289 Other specified polyneuropathies: Secondary | ICD-10-CM

## 2021-01-19 DIAGNOSIS — E785 Hyperlipidemia, unspecified: Secondary | ICD-10-CM

## 2021-01-19 DIAGNOSIS — R739 Hyperglycemia, unspecified: Secondary | ICD-10-CM

## 2021-01-19 DIAGNOSIS — Z7901 Long term (current) use of anticoagulants: Secondary | ICD-10-CM

## 2021-01-19 DIAGNOSIS — E519 Thiamine deficiency, unspecified: Secondary | ICD-10-CM

## 2021-01-19 DIAGNOSIS — I4821 Permanent atrial fibrillation: Secondary | ICD-10-CM | POA: Diagnosis not present

## 2021-01-19 DIAGNOSIS — I10 Essential (primary) hypertension: Secondary | ICD-10-CM

## 2021-01-19 NOTE — Patient Instructions (Signed)
[name] It was a pleasure speaking with you today. Please feel free to contact me if you have any questions or concerns. Below is information regarding you health goals.   Keep up the good work!  Brian Davidson, PharmD Clinical Pharmacist [facility] [phone] or [phone]  Visit Information  PATIENT GOALS: Goals Addressed            This Visit's Progress   . Chronic Care Management Pharmacy Care Plan   On track    CARE PLAN ENTRY (see longitudinal plan of care for additional care plan information)  Current Barriers:  . Chronic Disease Management support, education, and care coordination needs related to Hypertension, Hyperlipidemia/CAD, AFib, Osteopenia, Neuropathy/Pain   Hypertension / Aneurysm BP Readings from Last 3 Encounters:  12/22/20 138/79  12/15/20 118/64  11/24/20 (!) 142/72   . Pharmacist Clinical Goal(s): o Over the next 90 days, patient will work with PharmD and providers to maintain BP goal <130/80 . Current regimen:  . Metoprolol tartrate 100mg  twice daily . Diltiazem 240mg  daily  . Interventions: o Discussed blood pressure goals and tips to getting accurate blood pressure reading o Requested patient to check blood pressure 2 to 3 times per week and record . Patient self care activities - Over the next 90 days, patient will: o Check blood pressure 2 to 3 times per week, document, and provide at future appointments o Ensure daily salt intake < 2300 mg/day  Hyperlipidemia Lab Results  Component Value Date/Time   LDLCALC 100 (H) 11/24/2020 10:47 AM   LDLCALC 107 (H) 05/26/2020 11:03 AM   . Pharmacist Clinical Goal(s): o Over the next 90 days, patient will work with PharmD and providers to achieve LDL goal < 70 . Current regimen:  o Rosuvastatin 10mg  daily (started April 2022)  . Interventions: o Discussed LDL goal . Patient self care activities - Over the next 90 days, patient will: o Continue to take  rosuvastatin daily o Goal is to get 150 minutes of exercise per week - restart activity after radiation therapy complete.   Osteopenia . Pharmacist Clinical Goal(s) o Over the next 90 days, patient will work with PharmD and providers to reduce risk of fracture due to osteopenia . Current regimen:  . Calcium 600mg  daily . Vitamin D 2000 units daily  . Interventions: o Consider repeat DEXA Scan . Patient self care activities - Over the next 90 days, patient will: o Maintain osteopenia medication regimen    Neuropathy . Pharmacist Clinical Goal(s) o Over the next 90 days, patient will work with PharmD and providers to reduce risk symptoms of neuropathy and ensure adequate supplementation of vitamin deficiencies associated with neuropathy.  . Current regimen:  . Gabapentin 300mg  twice a day . Vitamin B12 5023mcg every other day . Vitamin B1 100mg  daily  . Biotin 1 tablet daily   . Interventions: o Reviewed recent labs from 11/24/2020 o Coordinated with Dr Charlett Blake to verify B1 and B12 doses - she recommended patient also decrease B1 dose to 100mg  every other day. Continue as planned to check labs again in June 2022.  Marland Kitchen Patient self care activities - Over the next 90 days, patient will: o Decrease thiamine / B1 dose to 100mg  every OTHER day.  o Come for last as planned in June 2022.   Atrial Fibrillation:  . Pharmacist Clinical Goal(s) o Over the next 90 days, patient will work with PharmD and providers to identify potential programs to assist with Xarelto  cost during coverage gap . Current regimen:  . Diltiazem 240mg  daily for heart rate  . Xarelto 20mg  daily to prevent stroke  . Interventions: o Assessed for qualification for Xarelto patient assistance. Mailed patient application for patient assistance for Xarelto . Patient self care activities - Over the next 90 days, patient will: o Complete Xarelto application and include financial documentation requested and copy of most recent  pharmacy costs for 2022 or explanation of pharmacy benefits (EOB) from [employer] o Continue current medications for atrial fibrillation  Medication management . Pharmacist Clinical Goal(s): o Over the next 90 days, patient will work with PharmD and providers to maintain optimal medication adherence . Current pharmacy: [facility] . Interventions o Comprehensive medication review performed. o Continue current medication management strategy . Patient self care activities - Over the next 90 days, patient will: o Focus on medication adherence by filling and taking medications appropriately  o Take medications as prescribed o Report any questions or concerns to PharmD and/or provider(s)  Please see past updates related to this goal by clicking on the "Past Updates" button in the selected goal         The patient verbalized understanding of instructions, educational materials, and care plan provided today and agreed to receive a mailed copy of patient instructions, educational materials, and care plan.   Telephone follow up appointment with care management team member scheduled for: 3 months

## 2021-01-19 NOTE — Telephone Encounter (Signed)
Cherre Robins, PharmD  Chism, Duard Brady, Wallace; Kem Boroughs D, CMA I called patient about decreasing thiamine to qod. He also is aware to come in for labs in 3 months. Gwendolyn Fill if you can just make sure needed labs are ordered you don't need to contact patient.  Tamy

## 2021-01-19 NOTE — Chronic Care Management (AMB) (Signed)
Chronic Care Management Pharmacy Note  01/19/2021 Name:  Brian Davidson MRN:  [id number] DOB:  [birth year]  Subjective: Brian Davidson is an 85 y.o. year old male who is a primary patient of Mosie Lukes, MD.  The CCM team was consulted for assistance with disease management and care coordination needs.    Engaged with patient by telephone for follow up visit in response to provider referral for pharmacy case management and/or care coordination services.   Consent to Services:  The patient was given information about Chronic Care Management services, agreed to services, and gave verbal consent prior to initiation of services.  Please see initial visit note for detailed documentation.   Patient Care Team: Mosie Lukes, MD as PCP - General (Family Medicine) Thompson Grayer, MD as PCP - Electrophysiology (Cardiology) Jettie Booze, MD as PCP - Cardiology (Cardiology) Thompson Grayer, MD as Consulting Physician (Cardiology) Franchot Mimes, MD as Consulting Physician (Family Medicine) Armbruster, Carlota Raspberry, MD as Consulting Physician (Gastroenterology) Gaynelle Arabian, MD as Consulting Physician (Orthopedic Surgery) Cira Rue, RN as Oncology Nurse Navigator Cherre Robins, PharmD (Pharmacist)  Recent office visits: 11/24/2020 - PCP (Dr Charlett Blake) F/U chronic conditions. Labs checked. Med changes -Dr Charlett Blake recommended he consider adding daily Krill Oil Capsule and per lab notes to decrease vitamin B12 to 500 mg sublingual tablets take 1 tablet 3 x a week and then repeat labs in 3 months  Recent consult visits: 12/30/2020 - Phone Call - Cardio (Dr Irish Lack) patient c/o lightheadedness that is getting worse. Dr Irish Lack recommended hydration and stop irbesartan / HCTZ. Monitor BP.   12/22/2020 - Rad Oncology (Dr Tammi Klippel). Stage T2a adenocarcinoma of the prostate with Gleason score of 4+3, and PSA of 5.98. Discussed the patient's workup and outlined the nature of prostate cancer in  this setting.  T stage, Gleason's score, and PSA put him into the unfavorable intermediate risk group. Accordingly, he is eligible for a variety of potential treatment options including brachytherapy, 5.5 weeks of external radiation, or prostatectomy. Cardio conditions noted to make him poor candidate for surgical intervention. At the conclusion of our conversation, the patient is interested in moving forward with 5.5 weeks of external beam therapy without ADT, reserving ADT for future use if the PSA were to continue to rise despite treatment  12/15/2020 - Cardio (Dr Irish Lack) Establish general cardio for non EP issues. Aortic atherosclerosis.  Started rosuvastatin 10 mg daily.  11/25/2020 - ENT (Dr Benjamine Mola) F/U Nosebleed, chronic rhinitis, deviated septum. Unable to see office notes.  Hospital visits: None in previous 6 months  Objective:  Lab Results  Component Value Date   CREATININE 1.22 11/24/2020   CREATININE 1.31 (H) 05/26/2020   CREATININE 1.34 (H) 05/08/2020    Lab Results  Component Value Date   HGBA1C 5.7 11/24/2020   Last diabetic Eye exam: No results found for: HMDIABEYEEXA  Last diabetic Foot exam: No results found for: HMDIABFOOTEX      Component Value Date/Time   CHOL 174 11/24/2020 1047   TRIG 158.0 (H) 11/24/2020 1047   HDL 42.10 11/24/2020 1047   CHOLHDL 4 11/24/2020 1047   VLDL 31.6 11/24/2020 1047   LDLCALC 100 (H) 11/24/2020 1047   LDLCALC 107 (H) 05/26/2020 1103    Hepatic Function Latest Ref Rng & Units 11/24/2020 05/26/2020 12/16/2019  Total Protein 6.0 - 8.3 g/dL 6.4 7.3 7.0  Albumin 3.5 - 5.2 g/dL 4.0 - 3.9  AST 0 - 37 U/L 20 18 21  ALT 0 - 53 U/L 29 12 17   Alk Phosphatase 39 - 117 U/L 66 - 64  Total Bilirubin 0.2 - 1.2 mg/dL 0.8 1.0 0.7  Bilirubin, Direct 0.0 - 0.3 mg/dL - - -    Lab Results  Component Value Date/Time   TSH 2.01 11/24/2020 10:47 AM   TSH 2.33 05/26/2020 11:03 AM    CBC Latest Ref Rng & Units 11/24/2020 11/03/2020 05/26/2020  WBC  4.0 - 10.5 K/uL 10.4 6.6 6.5  Hemoglobin 13.0 - 17.0 g/dL 14.5 13.9 15.1  Hematocrit 39.0 - 52.0 % 43.2 42.0 45.7  Platelets 150.0 - 400.0 K/uL 169.0 154.0 157    No results found for: VD25OH  Clinical ASCVD: Yes  The ASCVD Risk score Mikey Bussing DC Jr., et al., 2013) failed to calculate for the following reasons:   The 2013 ASCVD risk score is only valid for ages 73 to 62    Other: (CHADS2VASc if Afib, PHQ9 if depression, MMRC or CAT for COPD, ACT, DEXA)  Social History   Tobacco Use  Smoking Status Former Smoker  . Years: 10.00  . Types: Pipe, Cigars  . Quit date: 11/18/1985  . Years since quitting: 35.1  Smokeless Tobacco Never Used   BP Readings from Last 3 Encounters:  12/22/20 138/79  12/15/20 118/64  11/24/20 (!) 142/72   Pulse Readings from Last 3 Encounters:  12/22/20 74  12/15/20 63  11/24/20 68   Wt Readings from Last 3 Encounters:  12/22/20 227 lb (103 kg)  12/15/20 219 lb 4.2 oz (99.5 kg)  11/24/20 223 lb 14.4 oz (101.6 kg)    Assessment: Review of patient past medical history, allergies, medications, health status, including review of consultants reports, laboratory and other test data, was performed as part of comprehensive evaluation and provision of chronic care management services.   SDOH:  (Social Determinants of Health) assessments and interventions performed:  SDOH Interventions   Flowsheet Row Most Recent Value  SDOH Interventions   Financial Strain Interventions --  [discussed Xarelto PAP - sending application]  Transportation Interventions Intervention Not Indicated      CCM Care Plan  Allergies  Allergen Reactions  . Cardizem [Diltiazem]     Rash from Yellow dye in generic capsule. Can take different brand. Takes diltiazem - his allergy is to Cardizem  . Sulfa Antibiotics     Unknown childhood reaction    Medications Reviewed Today    Reviewed by Cherre Robins, PharmD (Pharmacist) on 01/19/21 at [phone]  Med List Status: <None>   Medication Order Taking? Sig Documenting Provider Last Dose Status Informant  Biotin 5000 MCG TABS [id number] Yes Take 5,000 mg by mouth daily. [provider] Taking Active Self  Calcium Carbonate-Vitamin D (CALTRATE 600+D PO) [id number] Yes Take 600 mg by mouth daily.  [provider] Taking Active Self  Cholecalciferol 50 MCG (2000 UT) TABS [id number] Yes Take 2,000 Units by mouth daily.  [provider] Taking Active Self  Cyanocobalamin (VITAMIN B-12) 5000 MCG TBDP [id number] Yes Take by mouth every other day. [provider] Taking Active   diltiazem (CARDIZEM CD) 240 MG 24 hr capsule [id number] Yes Take 1 capsule (240 mg total) by mouth daily. Mosie Lukes, MD Taking Active   gabapentin (NEURONTIN) 300 MG capsule [id number] Yes Take 1 capsule by mouth twice daily Mosie Lukes, MD Taking Active   magnesium oxide (MAG-OX) 400 MG tablet [id number] Yes Take 400 mg by mouth daily. [provider] Taking Active Self  metoprolol tartrate (  LOPRESSOR) 100 MG tablet [id number] Yes Take 1 tablet (100 mg total) by mouth 2 (two) times daily. Mosie Lukes, MD Taking Active   rivaroxaban (XARELTO) 20 MG TABS tablet [id number] Yes Take 1 tablet (20 mg total) by mouth daily. Mosie Lukes, MD Taking Active   rosuvastatin (CRESTOR) 10 MG tablet [id number] Yes Take 1 tablet (10 mg total) by mouth daily. Jettie Booze, MD Taking Active   thiamine 100 MG tablet [id number] Yes Take 100 mg by mouth daily.  [provider] Taking Active Self  Turmeric 500 MG TABS [id number] Yes Take 500 mg by mouth daily.  [provider] Taking Active Self  Med List Note Starleen Arms 05/27/15 1156):             Patient Active Problem List   Diagnosis Date Noted  . Hyperglycemia 11/24/2020  . Malignant neoplasm of prostate ([place]) 11/24/2020  . Thoracic aortic aneurysm ([place]) 05/27/2020  . Primary osteoarthritis of right knee 12/23/2019  .  Symptomatic bradycardia 05/31/2019  . Vertigo 07/27/2018  . Multiple closed fractures of ribs of right side 01/21/2018  . Thiamine deficiency 01/19/2018  . Peripheral neuropathy 07/13/2017  . Hyperlipidemia 07/13/2017  . Preventative health care 01/11/2017  . Carotid artery disease ([place]) 07/01/2016  . Mitral valve disorder 07/01/2016  . Low back pain 12/31/2015  . Osteopenia 12/31/2015  . Allergic state 12/31/2015  . Skin lesion 12/31/2015  . History of chicken pox   . Calculi, ureter 03/30/2015  . OA (osteoarthritis) of knee 05/10/2014  . Pacemaker-[place] 02/01/2012  . Permanent atrial fibrillation ([place])   . Hypertension   . Heart murmur   . Chronic anticoagulation     Immunization History  Administered Date(s) Administered  . Influenza, High Dose Seasonal PF 06/18/2017, 06/11/2018, 04/29/2019, 04/28/2020  . Influenza-Unspecified 06/11/2016  . PFIZER(Purple Top)SARS-COV-2 Vaccination 09/24/2019, 10/10/2019, 05/30/2020  . Pneumococcal Conjugate-13 07/20/2015  . Pneumococcal Polysaccharide-23 09/05/2016  . Tdap 11/14/2014, 05/29/2019  . Zoster Recombinat (Shingrix) 12/26/2016, 03/21/2017    Conditions to be addressed/monitored: Atrial Fibrillation, CAD, HTN, HLD and pre DM; prostate cancer (active) neuropathy  Care Plan : General Pharmacy (Adult)  Updates made by Cherre Robins, PHARMD since 01/19/2021 12:00 AM    Problem: Chronic Disease Management support, education, and care coordination needs related to Hypertension, Hyperlipidemia/CAD, AFib, Osteopenia, Neuropathy/Pain   Priority: High  Onset Date: 01/19/2021  Note:   Current Barriers:  . Unable to independently afford treatment regimen . Unable to achieve control of lipids   . Chronic Disease Management support, education, and care coordination needs related to Hypertension, Hyperlipidemia/CAD, AFib, Osteopenia, Neuropathy/Pain  Pharmacist Clinical Goal(s):  Marland Kitchen Over the next 180 days, patient will verbalize ability  to afford treatment regimen . achieve control of lipds as evidenced by LDL <70 . maintain control of BP as evidenced by BP <130/80  through collaboration with PharmD and provider.   Interventions: . 1:1 collaboration with Mosie Lukes, MD regarding development and update of comprehensive plan of care as evidenced by provider attestation and co-signature . Inter-disciplinary care team collaboration (see longitudinal plan of care) . Comprehensive medication review performed; medication list updated in electronic medical record   Hypertension / Thoracic Aortic Aneurysm BP Readings from Last 3 Encounters:  12/22/20 138/79  12/15/20 118/64  11/24/20 (!) 142/72   . BP improving; goal is to maintain BP goal <130/80 . Patient recently stopped irbesartan-HCTZ due to dizziness - per Dr Irish Lack . Today patient report dizziness has resolved since  stopping irbesartan-HCTZ . Home BP checked: 130-150 / 70-80 . Current regimen:  . Metoprolol tartrate 189m twice daily . Diltiazem 2424mdaily  . Interventions: o Discussed blood pressure goals and tips to getting accurate blood pressure reading o Requested patient to check blood pressure 2 to 3 times per week and record o Ensure daily salt intake < 2300 mg/day  Hyperlipidemia Lab Results  Component Value Date/Time   LDLCALC 100 (H) 11/24/2020 10:47 AM   LDLCALC 107 (H) 05/26/2020 11:03 AM   . Not at goal; LDL goal < 70 . Current regimen:  o Rosuvastatin 1011maily (started April 2022)  . Interventions: o Discussed LDL goal o Continue to take rosuvastatin daily o Recommend at least 150 minutes of exercise per week - restart activity after radiation therapy complete.   Osteopenia . Current regimen:  . Calcium 600m93mily . Vitamin D 2000 units daily  . Interventions: o Consider repeat DEXA Scan (consider after completion of radiation therapy o Recommended maintain osteopenia medication regimen   Neuropathy . Per patient neuropathic  is controlled with current therapy . Last B12 was 460 (07/13/2017) . Last B1 was 87 (11/24/2020) . Current regimen:  . Gabapentin 300mg37mce a day . Vitamin B12 5000mcg45mry other day (dose lowered per Dr Blyth Charlett Blake2022) . Vitamin B1 100mg d6m  . Biotin 1 tablet daily   . Interventions: o Reviewed recent labs from 11/24/2020 o Coordinated with Dr Blyth tCharlett Blakeify B1 and B12 doses . Patient self care activities - Over the next 90 days, patient will: o FILL IN LATER  Atrial Fibrillation:  . Rate current controlled and patient is taking stroke prevention therapy daily . CHADS2VASc score: 4 . Current regimen:  . Diltiazem 240mg da28mfor heart rate  . Xarelto 20mg dai78mo prevent stroke  . Interventions: o Assessed for qualification for Xarelto patient assistance. Mailed patient application for patient assistance for Xarelto o Reviewed last SCr and eGFR - last eGFR was 54.95 (12/02/2020). Dose of Xarelto current appropriate. If eGFR drops to < 50 should consider lower Xarelto dose of 15mg dail51m o Continue current medications for atrial fibrillation  Medication management . Pharmacist Clinical Goal(s): o Over the next 90 days, patient will work with PharmD and providers to maintain optimal medication adherence . [facility]: [facility] ([place] [facility] [place] [place] [facility] o Comprehensive medication review performed. o Continue current medication management strategy  Pre-Diabetes: . Controlled . Current treatment: o Diet and exercise  . Does not check BG at home . Usually exercises 5 to 7 days per week. Will take off during radiation therapy that will start 01/26/21.  . Interventions:  o Continue to limiting sugar and carbohydrate intake o Continue as planned to restart exercise after upcoming radiation therapy.   Patient Goals/Self-Care Activities . Over the next 180 days, patient will:  take medications as prescribed, check blood pressure 2 to 3  times per week, document, and provide at future appointments, and collaborate with provider on medication access solutions  Follow Up Plan: Telephone follow up appointment with care management team member scheduled for:    2 to 3 months     Medication Assistance: Mailed application for Xarelto PAP - patient will have to hit Medicare coverage gap and spend 4% of income OOP for meds before eligibly for program.  [facility]  Phone: [phone] Fax: [phone]  [place], [place] [address] Phone: [phone] Fax: [phone]  [place], [place] - [address] [place] [address] Phone: [phone] Fax: [phone]   Follow Up:  Patient agrees to Care Plan and Follow-up.  Plan: Telephone follow up appointment with care management team member scheduled for:  2 to 3 months  Cherre Robins, PharmD Clinical Pharmacist [place] [facility]

## 2021-01-19 NOTE — Telephone Encounter (Signed)
Called pt to get scheduled for lab appointment 02/27/21

## 2021-01-19 NOTE — Telephone Encounter (Signed)
-----   Message from Mosie Lukes, MD sent at 01/19/2021 12:51 PM EDT ----- Regarding: RE: B12 and B1 recommendations Please clarify with patient to drop his Thiamine 100 mg to qod and keep his B12 at QOD recheck both in 3 months to evaluate improvement. Please order and arrange lab visit ----- Message ----- From: Cherre Robins, PharmD Sent: 01/19/2021  10:50 AM EDT To: Mosie Lukes, MD Subject: B12 and B1 recommendations                     Hi Dr Charlett Blake,  I had a phone follow up with [name] this morning. He was not sure which B vitamin you wanted him to decrease.  The labs on 3/322/2022 were for thiamine or B1. Result was a little high at 87 but the notes mentioned lowering B12 from 5000 mcg daily to take every other day.   Currently he is still taking thiamine / B1 100mg  daily and B12 5044mcg every other day.   Thanks,  [place]

## 2021-01-26 ENCOUNTER — Ambulatory Visit
Admission: RE | Admit: 2021-01-26 | Discharge: 2021-01-26 | Disposition: A | Discharge status: Home / Self Care | Payer: PPO | Source: Ambulatory Visit | Attending: Radiation Oncology | Admitting: Radiation Oncology

## 2021-01-26 ENCOUNTER — Other Ambulatory Visit: Payer: Self-pay

## 2021-01-26 DIAGNOSIS — Z510 Encounter for antineoplastic radiation therapy: Secondary | ICD-10-CM | POA: Diagnosis not present

## 2021-01-26 DIAGNOSIS — C61 Malignant neoplasm of prostate: Secondary | ICD-10-CM

## 2021-01-27 ENCOUNTER — Other Ambulatory Visit: Payer: Self-pay

## 2021-01-27 ENCOUNTER — Telehealth: Payer: Self-pay | Admitting: Radiation Oncology

## 2021-01-27 ENCOUNTER — Ambulatory Visit
Admission: RE | Admit: 2021-01-27 | Discharge: 2021-01-27 | Disposition: A | Discharge status: Home / Self Care | Payer: PPO | Source: Ambulatory Visit | Attending: Radiation Oncology | Admitting: Radiation Oncology

## 2021-01-27 DIAGNOSIS — C61 Malignant neoplasm of prostate: Secondary | ICD-10-CM | POA: Diagnosis not present

## 2021-01-27 DIAGNOSIS — Z510 Encounter for antineoplastic radiation therapy: Secondary | ICD-10-CM | POA: Diagnosis not present

## 2021-01-27 NOTE — Telephone Encounter (Signed)
Received email from L2 therapist that the patient verbalized concern about "trouble emptying his bladder." Phoned patient to inquire further. Patient reports urinary hesitancy, having to stand to completely empty his bladder, nocturia x 1-2, and intermittency. Patient denies dysuria or hematuria. Patient received his first of 65 intended prostate radiation treatments yesterday. Patient denies being prescribed Flomax or other medication to help him empty his bladder. Patient's prostate volume at consult was 28.58 grams. Patient understands this RN will relay these findings to his providers and phone him back with further instructions.

## 2021-01-28 ENCOUNTER — Ambulatory Visit
Admission: RE | Admit: 2021-01-28 | Discharge: 2021-01-28 | Disposition: A | Discharge status: Home / Self Care | Payer: PPO | Source: Ambulatory Visit | Attending: Radiation Oncology | Admitting: Radiation Oncology

## 2021-01-28 ENCOUNTER — Other Ambulatory Visit: Payer: Self-pay | Admitting: Urology

## 2021-01-28 DIAGNOSIS — C61 Malignant neoplasm of prostate: Secondary | ICD-10-CM | POA: Diagnosis not present

## 2021-01-28 DIAGNOSIS — Z510 Encounter for antineoplastic radiation therapy: Secondary | ICD-10-CM | POA: Diagnosis not present

## 2021-01-28 MED ORDER — SILODOSIN 8 MG PO CAPS: 8.0000 mg | ORAL_CAPSULE | Freq: Every day | ORAL | 2 refills | Status: DC

## 2021-01-29 ENCOUNTER — Ambulatory Visit: Payer: PPO | Admitting: Pharmacist

## 2021-01-29 ENCOUNTER — Ambulatory Visit
Admission: RE | Admit: 2021-01-29 | Discharge: 2021-01-29 | Disposition: A | Discharge status: Home / Self Care | Payer: PPO | Source: Ambulatory Visit | Attending: Radiation Oncology | Admitting: Radiation Oncology

## 2021-01-29 ENCOUNTER — Telehealth: Payer: Self-pay | Admitting: Radiation Oncology

## 2021-01-29 ENCOUNTER — Other Ambulatory Visit: Payer: Self-pay

## 2021-01-29 DIAGNOSIS — C61 Malignant neoplasm of prostate: Secondary | ICD-10-CM

## 2021-01-29 DIAGNOSIS — E785 Hyperlipidemia, unspecified: Secondary | ICD-10-CM

## 2021-01-29 DIAGNOSIS — I4821 Permanent atrial fibrillation: Secondary | ICD-10-CM

## 2021-01-29 DIAGNOSIS — Z510 Encounter for antineoplastic radiation therapy: Secondary | ICD-10-CM | POA: Diagnosis not present

## 2021-01-29 LAB — URINALYSIS, COMPLETE (UACMP) WITH MICROSCOPIC
Bilirubin Urine: NEGATIVE
Glucose, UA: NEGATIVE mg/dL
Ketones, ur: NEGATIVE mg/dL
Nitrite: NEGATIVE
Protein, ur: NEGATIVE mg/dL
Specific Gravity, Urine: 1.01 (ref 1.005–1.030)
pH: 6 (ref 5.0–8.0)

## 2021-01-29 NOTE — Chronic Care Management (AMB) (Signed)
Chronic Care Management Pharmacy Note  01/29/2021 Name:  Brian Davidson MRN:  [id number] DOB:  1936-07-24  Subjective: Brian Davidson is an 85 y.o. year old male who is a primary patient of Mosie Lukes, MD.  The CCM team was consulted for assistance with disease management and care coordination needs.    Engaged with patient face to face for assistance with PAP forms for Xarelto in response to provider referral for pharmacy case management and/or care coordination services.   Consent to Services:  The patient was given information about Chronic Care Management services, agreed to services, and gave verbal consent prior to initiation of services.  Please see initial visit note for detailed documentation.   Patient Care Team: Mosie Lukes, MD as PCP - General (Family Medicine) Thompson Grayer, MD as PCP - Electrophysiology (Cardiology) Jettie Booze, MD as PCP - Cardiology (Cardiology) Thompson Grayer, MD as Consulting Physician (Cardiology) Franchot Mimes, MD as Consulting Physician (Family Medicine) Armbruster, Carlota Raspberry, MD as Consulting Physician (Gastroenterology) Gaynelle Arabian, MD as Consulting Physician (Orthopedic Surgery) Cira Rue, RN as Oncology Nurse Navigator Cherre Robins, PharmD (Pharmacist)  Recent office visits: 11/24/2020 - PCP (Dr Charlett Blake) F/U chronic conditions. Labs checked. Med changes -Dr Charlett Blake recommended he consider adding daily Krill Oil Capsule. Also per lab notes to decrease vitamin B12 to 500 mg sublingual tablets take 1 tablet 3 x a week and then repeat labs in 3 months  Recent consult visits: 12/30/2020 - Phone Call - Cardio (Dr Irish Lack) patient c/o lightheadedness that is getting worse. Dr Irish Lack recommended hydration and stop irbesartan / HCTZ. Monitor BP.   12/22/2020 - Rad Oncology (Dr Tammi Klippel). Stage T2aadenocarcinoma of the prostate with Gleason score of 4+3, and PSA of5.98. Discussed the patient's workup and outlined the  nature of prostate cancer in this setting.  T stage, Gleason's score, and PSA put him into theunfavorableintermediaterisk group. Accordingly, he is eligible for a variety of potential treatment options including brachytherapy, 5.5weeks of external radiation, or prostatectomy. Cardio conditions noted to make him poor candidate for surgical intervention. At the conclusion of our conversation, the patient is interested in moving forward with5.5 weeks of external beam therapywithout ADT, reserving ADT for future use if the PSA were to continue to rise despite treatment  12/15/2020 - Cardio (Dr Irish Lack) Establish general cardio for non EP issues. Aortic atherosclerosis. Started rosuvastatin 10 mg daily.  11/25/2020 - ENT (Dr Benjamine Mola) F/U Nosebleed, chronic rhinitis, deviated septum. Unable to see office notes.  Hospital visits: None in previous 6 months  Objective:  Lab Results  Component Value Date   CREATININE 1.22 11/24/2020   CREATININE 1.31 (H) 05/26/2020   CREATININE 1.34 (H) 05/08/2020    Lab Results  Component Value Date   HGBA1C 5.7 11/24/2020   Last diabetic Eye exam: No results found for: HMDIABEYEEXA  Last diabetic Foot exam: No results found for: HMDIABFOOTEX      Component Value Date/Time   CHOL 174 11/24/2020 1047   TRIG 158.0 (H) 11/24/2020 1047   HDL 42.10 11/24/2020 1047   CHOLHDL 4 11/24/2020 1047   VLDL 31.6 11/24/2020 1047   LDLCALC 100 (H) 11/24/2020 1047   LDLCALC 107 (H) 05/26/2020 1103    Hepatic Function Latest Ref Rng & Units 11/24/2020 05/26/2020 12/16/2019  Total Protein 6.0 - 8.3 g/dL 6.4 7.3 7.0  Albumin 3.5 - 5.2 g/dL 4.0 - 3.9  AST 0 - 37 U/L 20 18 21   ALT 0 - 53 U/L  29 12 17   Alk Phosphatase 39 - 117 U/L 66 - 64  Total Bilirubin 0.2 - 1.2 mg/dL 0.8 1.0 0.7  Bilirubin, Direct 0.0 - 0.3 mg/dL - - -    Lab Results  Component Value Date/Time   TSH 2.01 11/24/2020 10:47 AM   TSH 2.33 05/26/2020 11:03 AM    CBC Latest Ref Rng & Units  11/24/2020 11/03/2020 05/26/2020  WBC 4.0 - 10.5 K/uL 10.4 6.6 6.5  Hemoglobin 13.0 - 17.0 g/dL 14.5 13.9 15.1  Hematocrit 39.0 - 52.0 % 43.2 42.0 45.7  Platelets 150.0 - 400.0 K/uL 169.0 154.0 157    No results found for: VD25OH  Clinical ASCVD: Yes  The ASCVD Risk score Mikey Bussing DC Jr., et al., 2013) failed to calculate for the following reasons:   The 2013 ASCVD risk score is only valid for ages 7 to 48      Social History   Tobacco Use  Smoking Status Former Smoker  . Years: 10.00  . Types: Pipe, Cigars  . Quit date: 11/18/1985  . Years since quitting: 35.2  Smokeless Tobacco Never Used   BP Readings from Last 3 Encounters:  12/22/20 138/79  12/15/20 118/64  11/24/20 (!) 142/72   Pulse Readings from Last 3 Encounters:  12/22/20 74  12/15/20 63  11/24/20 68   Wt Readings from Last 3 Encounters:  12/22/20 227 lb (103 kg)  12/15/20 219 lb 4.2 oz (99.5 kg)  11/24/20 223 lb 14.4 oz (101.6 kg)    Assessment: Review of patient past medical history, allergies, medications, health status, including review of consultants reports, laboratory and other test data, was performed as part of comprehensive evaluation and provision of chronic care management services.   SDOH:  (Social Determinants of Health) assessments and interventions performed:    CCM Care Plan  Allergies  Allergen Reactions  . Cardizem [Diltiazem]     Rash from Yellow dye in generic capsule. Can take different brand. Takes diltiazem - his allergy is to Cardizem  . Sulfa Antibiotics     Unknown childhood reaction    Medications Reviewed Today    Reviewed by Cherre Robins, PharmD (Pharmacist) on 01/29/21 at 1628  Med List Status: <None>  Medication Order Taking? Sig Documenting Provider Last Dose Status Informant  Biotin 5000 MCG TABS [id number] Yes Take 5,000 mg by mouth daily. [provider] Taking Active Self  Calcium Carbonate-Vitamin D (CALTRATE 600+D PO) [id number] Yes Take 600 mg by mouth  daily.  [provider] Taking Active Self  Cholecalciferol 50 MCG (2000 UT) TABS [id number] Yes Take 2,000 Units by mouth daily.  [provider] Taking Active Self  Cyanocobalamin (VITAMIN B-12) 5000 MCG TBDP [id number] Yes Take by mouth every other day. [provider] Taking Active   diltiazem (CARDIZEM CD) 240 MG 24 hr capsule [id number] Yes Take 1 capsule (240 mg total) by mouth daily. Mosie Lukes, MD Taking Active   gabapentin (NEURONTIN) 300 MG capsule [id number] Yes Take 1 capsule by mouth twice daily Mosie Lukes, MD Taking Active   magnesium oxide (MAG-OX) 400 MG tablet [id number] Yes Take 400 mg by mouth daily. [provider] Taking Active Self  metoprolol tartrate (LOPRESSOR) 100 MG tablet [id number] Yes Take 1 tablet (100 mg total) by mouth 2 (two) times daily. Mosie Lukes, MD Taking Active   rivaroxaban (XARELTO) 20 MG TABS tablet [id number] Yes Take 1 tablet (20 mg total) by mouth daily. Mosie Lukes, MD Taking Active   rosuvastatin (  CRESTOR) 10 MG tablet [id number] Yes Take 1 tablet (10 mg total) by mouth daily. Jettie Booze, MD Taking Active   silodosin (RAPAFLO) 8 MG CAPS capsule [id number] Yes Take 1 capsule (8 mg total) by mouth daily with supper. Bruning, Ashlyn, PA-C Taking Active   thiamine 100 MG tablet [id number] Yes Take 100 mg by mouth every other day. [provider] Taking Active Self  Turmeric 500 MG TABS [id number] Yes Take 500 mg by mouth daily.  [provider] Taking Active Self  Med List Note Starleen Arms 05/27/15 1156):             Patient Active Problem List   Diagnosis Date Noted  . Hyperglycemia 11/24/2020  . Malignant neoplasm of prostate ([place]) 11/24/2020  . Thoracic aortic aneurysm ([place]) 05/27/2020  . Primary osteoarthritis of right knee 12/23/2019  . Symptomatic bradycardia 05/31/2019  . Vertigo 07/27/2018  . Multiple closed fractures of ribs of right side 01/21/2018   . Thiamine deficiency 01/19/2018  . Peripheral neuropathy 07/13/2017  . Hyperlipidemia 07/13/2017  . Preventative health care 01/11/2017  . Carotid artery disease ([place]) 07/01/2016  . Mitral valve disorder 07/01/2016  . Low back pain 12/31/2015  . Osteopenia 12/31/2015  . Allergic state 12/31/2015  . Skin lesion 12/31/2015  . History of chicken pox   . Calculi, ureter 03/30/2015  . OA (osteoarthritis) of knee 05/10/2014  . Pacemaker-St.Jude 02/01/2012  . Permanent atrial fibrillation ([place])   . Hypertension   . Heart murmur   . Chronic anticoagulation     Immunization History  Administered Date(s) Administered  . Influenza, High Dose Seasonal PF 06/18/2017, 06/11/2018, 04/29/2019, 04/28/2020  . Influenza-Unspecified 06/11/2016  . PFIZER(Purple Top)SARS-COV-2 Vaccination 09/24/2019, 10/10/2019, 05/30/2020  . Pneumococcal Conjugate-13 07/20/2015  . Pneumococcal Polysaccharide-23 09/05/2016  . Tdap 11/14/2014, 05/29/2019  . Zoster Recombinat (Shingrix) 12/26/2016, 03/21/2017    Conditions to be addressed/monitored: Atrial Fibrillation, CAD, HTN, HLD and pre DM; prostat cancer (currently receiving radiation therpy);   There are no care plans that you recently modified to display for this patient.   Medication Assistance: Application for Xarelto  medication assistance program. in process.  Anticipated assistance start date Unknown - patient has to reach 4% of income for OOP spend before he has apply. .  See plan of care for additional detail.  Patient's preferred pharmacy is:  [facility]) [place], [place] [address] [place] [address] Phone: [phone] Fax: [phone]  [facility] [address] [place], [place] - [address] [place] [address] Phone: [phone] Fax: [phone]  [place], [place] - [address] [address] Phone: [phone] Fax: [phone]   Follow Up:  Patient agrees to Care Plan and Follow-up.  Plan: Telephone follow up appointment with care management team member scheduled for:  1-2 months  Cherre Robins, PharmD Clinical Pharmacist [facility]

## 2021-01-29 NOTE — Telephone Encounter (Signed)
Circled back with patient about symptoms after consulting with Ashlyn Bruning, PA-C. Explained to the patient we need to collect urine today to sample for infection. Explained it is uncommon to have urinary symptoms so early into the course of radiation. Explained his symptoms could be the effect of his fiducial marker and spaceoar placement. Patient understands to provide a urine sample following radiation treatment today. Also, explained that Ashlyn electronically prescribed him rapaflo. Patient stated, "Yes, I picked up the rapaflo this morning." Patient denies any other needs at this time. Answered all patient questions to the best of my ability. Patient verbalized understanding of all reviewed and appreciation for the call back.

## 2021-01-29 NOTE — Patient Instructions (Signed)
Visit Information  PATIENT GOALS: Goals Addressed            This Visit's Progress   . Chronic Care Management Pharmacy Care Plan   On track    CARE PLAN ENTRY (see longitudinal plan of care for additional care plan information)  Current Barriers:  . Chronic Disease Management support, education, and care coordination needs related to Hypertension, Hyperlipidemia/CAD, AFib, Osteopenia, Neuropathy/Pain   Hypertension / Aneurysm BP Readings from Last 3 Encounters:  12/22/20 138/79  12/15/20 118/64  11/24/20 (!) 142/72   . Pharmacist Clinical Goal(s): o Over the next 90 days, patient will work with PharmD and providers to maintain BP goal <130/80 . Current regimen:  . Metoprolol tartrate 100mg  twice daily . Diltiazem 240mg  daily  . Interventions: o Discussed blood pressure goals and tips to getting accurate blood pressure reading o Requested patient to check blood pressure 2 to 3 times per week and record . Patient self care activities - Over the next 90 days, patient will: o Check blood pressure 2 to 3 times per week, document, and provide at future appointments o Ensure daily salt intake < 2300 mg/day  Hyperlipidemia Lab Results  Component Value Date/Time   LDLCALC 100 (H) 11/24/2020 10:47 AM   LDLCALC 107 (H) 05/26/2020 11:03 AM   . Pharmacist Clinical Goal(s): o Over the next 90 days, patient will work with PharmD and providers to achieve LDL goal < 70 . Current regimen:  o Rosuvastatin 10mg  daily (started April 2022)  . Interventions: o Discussed LDL goal . Patient self care activities - Over the next 90 days, patient will: o Continue to take rosuvastatin daily o Goal is to get 150 minutes of exercise per week - restart activity after radiation therapy complete.   Osteopenia . Pharmacist Clinical Goal(s) o Over the next 90 days, patient will work with PharmD and providers to reduce risk of fracture due to osteopenia . Current regimen:  . Calcium 600mg   daily . Vitamin D 2000 units daily  . Interventions: o Consider repeat DEXA Scan . Patient self care activities - Over the next 90 days, patient will: o Maintain osteopenia medication regimen    Neuropathy . Pharmacist Clinical Goal(s) o Over the next 90 days, patient will work with PharmD and providers to reduce risk symptoms of neuropathy and ensure adequate supplementation of vitamin deficiencies associated with neuropathy.  . Current regimen:  . Gabapentin 300mg  twice a day . Vitamin B12 513mcg every other day . Vitamin B1 100mg  daily  . Biotin 1 tablet daily   . Patient self care activities - Over the next 90 days, patient will: o Decrease thiamine / B1 dose to 100mg  every OTHER day.  o Come for labs as planned in June 2022.   Atrial Fibrillation:  . Pharmacist Clinical Goal(s) o Over the next 90 days, patient will work with PharmD and providers to identify potential programs to assist with Xarelto cost during coverage gap . Current regimen:  . Diltiazem 240mg  daily for heart rate  . Xarelto 20mg  daily to prevent stroke  . Interventions: o Assisted patient in completing PAP application for Xarleto . Patient self care activities - Over the next 90 days, patient will: o Gather OOP expense for all meds for 2022 and bring to office if >4% of income for Xarelto PCP applications o Continue current medications for atrial fibrillation  Medication management . Pharmacist Clinical Goal(s): o Over the next 90 days, patient will work with PharmD and providers to  maintain optimal medication adherence . Current pharmacy: [facility] . Interventions o Comprehensive medication review performed. o Continue current medication management strategy . Patient self care activities - Over the next 90 days, patient will: o Focus on medication adherence by filling and taking medications appropriately  o Take medications as prescribed o Report any questions or concerns to PharmD and/or  provider(s)  Please see past updates related to this goal by clicking on the "Past Updates" button in the selected goal         The patient verbalized understanding of instructions, educational materials, and care plan provided today and declined offer to receive copy of patient instructions, educational materials, and care plan.   Telephone follow up appointment with care management team member scheduled for: 1 to 2 months  Cherre Robins, PharmD Clinical Pharmacist [facility]

## 2021-01-31 LAB — URINE CULTURE: Culture: <10000 — AB

## 2021-02-02 ENCOUNTER — Ambulatory Visit
Admission: RE | Admit: 2021-02-02 | Discharge: 2021-02-02 | Disposition: A | Discharge status: Home / Self Care | Payer: PPO | Source: Ambulatory Visit | Attending: Radiation Oncology | Admitting: Radiation Oncology

## 2021-02-02 ENCOUNTER — Other Ambulatory Visit: Payer: Self-pay

## 2021-02-02 DIAGNOSIS — Z510 Encounter for antineoplastic radiation therapy: Secondary | ICD-10-CM | POA: Diagnosis not present

## 2021-02-02 DIAGNOSIS — C61 Malignant neoplasm of prostate: Secondary | ICD-10-CM | POA: Diagnosis not present

## 2021-02-03 ENCOUNTER — Telehealth: Payer: Self-pay | Admitting: Radiation Oncology

## 2021-02-03 ENCOUNTER — Ambulatory Visit
Admission: RE | Admit: 2021-02-03 | Discharge: 2021-02-03 | Disposition: A | Discharge status: Home / Self Care | Payer: PPO | Source: Ambulatory Visit | Attending: Radiation Oncology | Admitting: Radiation Oncology

## 2021-02-03 DIAGNOSIS — R3911 Hesitancy of micturition: Secondary | ICD-10-CM | POA: Diagnosis not present

## 2021-02-03 DIAGNOSIS — C61 Malignant neoplasm of prostate: Secondary | ICD-10-CM | POA: Insufficient documentation

## 2021-02-03 DIAGNOSIS — Z510 Encounter for antineoplastic radiation therapy: Secondary | ICD-10-CM | POA: Insufficient documentation

## 2021-02-03 NOTE — Telephone Encounter (Signed)
Phoned patient as requested by Freeman Caldron, PA-C. No answer. Left voicemail message explaining that there was no obvious infection noted from his sample. Provided direct number and encouraged patient to contact this RN should rapaflo NOT be working to improve LUTS.

## 2021-02-03 NOTE — Telephone Encounter (Signed)
-----   Message from Freeman Caldron, [place] sent at 02/02/2021  8:30 AM EDT ----- Please call patient to advise that there is no obvious infection on curine culture- appears contaminant only. If his symptoms are improved on Rapaflo and he has not developed other sxs such as dysuria or suprapubic discomfort, I do not see a role for antibiotics and would just recommend he continue Rapaflo as prescribed.

## 2021-02-03 NOTE — Telephone Encounter (Signed)
Patient returned this RN's call. Patient left voicemail message verbalizing, "the rapaflo seems to be working for now." Patient denies additional needs in his voicemail. Patient verbalized appreciation for the nurse checking in.

## 2021-02-04 ENCOUNTER — Ambulatory Visit
Admission: RE | Admit: 2021-02-04 | Discharge: 2021-02-04 | Disposition: A | Discharge status: Home / Self Care | Payer: PPO | Source: Ambulatory Visit | Attending: Radiation Oncology | Admitting: Radiation Oncology

## 2021-02-04 ENCOUNTER — Other Ambulatory Visit: Payer: Self-pay

## 2021-02-04 DIAGNOSIS — Z510 Encounter for antineoplastic radiation therapy: Secondary | ICD-10-CM | POA: Diagnosis not present

## 2021-02-04 DIAGNOSIS — C61 Malignant neoplasm of prostate: Secondary | ICD-10-CM | POA: Diagnosis not present

## 2021-02-05 ENCOUNTER — Ambulatory Visit
Admission: RE | Admit: 2021-02-05 | Discharge: 2021-02-05 | Disposition: A | Discharge status: Home / Self Care | Payer: PPO | Source: Ambulatory Visit | Attending: Radiation Oncology | Admitting: Radiation Oncology

## 2021-02-05 DIAGNOSIS — C61 Malignant neoplasm of prostate: Secondary | ICD-10-CM | POA: Diagnosis not present

## 2021-02-05 DIAGNOSIS — Z510 Encounter for antineoplastic radiation therapy: Secondary | ICD-10-CM | POA: Diagnosis not present

## 2021-02-08 ENCOUNTER — Ambulatory Visit
Admission: RE | Admit: 2021-02-08 | Discharge: 2021-02-08 | Disposition: A | Discharge status: Home / Self Care | Payer: PPO | Source: Ambulatory Visit | Attending: Radiation Oncology | Admitting: Radiation Oncology

## 2021-02-08 ENCOUNTER — Other Ambulatory Visit: Payer: Self-pay

## 2021-02-08 DIAGNOSIS — Z510 Encounter for antineoplastic radiation therapy: Secondary | ICD-10-CM | POA: Diagnosis not present

## 2021-02-08 DIAGNOSIS — C61 Malignant neoplasm of prostate: Secondary | ICD-10-CM | POA: Diagnosis not present

## 2021-02-09 ENCOUNTER — Ambulatory Visit
Admission: RE | Admit: 2021-02-09 | Discharge: 2021-02-09 | Disposition: A | Discharge status: Home / Self Care | Payer: PPO | Source: Ambulatory Visit | Attending: Radiation Oncology | Admitting: Radiation Oncology

## 2021-02-09 DIAGNOSIS — Z510 Encounter for antineoplastic radiation therapy: Secondary | ICD-10-CM | POA: Diagnosis not present

## 2021-02-09 DIAGNOSIS — C61 Malignant neoplasm of prostate: Secondary | ICD-10-CM | POA: Diagnosis not present

## 2021-02-10 ENCOUNTER — Other Ambulatory Visit: Payer: Self-pay

## 2021-02-10 ENCOUNTER — Ambulatory Visit
Admission: RE | Admit: 2021-02-10 | Discharge: 2021-02-10 | Disposition: A | Discharge status: Home / Self Care | Payer: PPO | Source: Ambulatory Visit | Attending: Radiation Oncology | Admitting: Radiation Oncology

## 2021-02-10 DIAGNOSIS — Z510 Encounter for antineoplastic radiation therapy: Secondary | ICD-10-CM | POA: Diagnosis not present

## 2021-02-10 DIAGNOSIS — C61 Malignant neoplasm of prostate: Secondary | ICD-10-CM | POA: Diagnosis not present

## 2021-02-11 ENCOUNTER — Ambulatory Visit
Admission: RE | Admit: 2021-02-11 | Discharge: 2021-02-11 | Disposition: A | Discharge status: Home / Self Care | Payer: PPO | Source: Ambulatory Visit | Attending: Radiation Oncology | Admitting: Radiation Oncology

## 2021-02-11 DIAGNOSIS — C61 Malignant neoplasm of prostate: Secondary | ICD-10-CM | POA: Diagnosis not present

## 2021-02-11 DIAGNOSIS — Z510 Encounter for antineoplastic radiation therapy: Secondary | ICD-10-CM | POA: Diagnosis not present

## 2021-02-12 ENCOUNTER — Ambulatory Visit
Admission: RE | Admit: 2021-02-12 | Discharge: 2021-02-12 | Disposition: A | Discharge status: Home / Self Care | Payer: PPO | Source: Ambulatory Visit | Attending: Radiation Oncology | Admitting: Radiation Oncology

## 2021-02-12 ENCOUNTER — Other Ambulatory Visit: Payer: Self-pay

## 2021-02-12 ENCOUNTER — Other Ambulatory Visit: Payer: Self-pay | Admitting: Medical

## 2021-02-12 DIAGNOSIS — C61 Malignant neoplasm of prostate: Secondary | ICD-10-CM | POA: Diagnosis not present

## 2021-02-12 DIAGNOSIS — Z510 Encounter for antineoplastic radiation therapy: Secondary | ICD-10-CM | POA: Diagnosis not present

## 2021-02-15 ENCOUNTER — Other Ambulatory Visit: Payer: Self-pay

## 2021-02-15 ENCOUNTER — Ambulatory Visit
Admission: RE | Admit: 2021-02-15 | Discharge: 2021-02-15 | Disposition: A | Discharge status: Home / Self Care | Payer: PPO | Source: Ambulatory Visit | Attending: Radiation Oncology | Admitting: Radiation Oncology

## 2021-02-15 DIAGNOSIS — C61 Malignant neoplasm of prostate: Secondary | ICD-10-CM | POA: Diagnosis not present

## 2021-02-15 DIAGNOSIS — Z510 Encounter for antineoplastic radiation therapy: Secondary | ICD-10-CM | POA: Diagnosis not present

## 2021-02-16 ENCOUNTER — Ambulatory Visit
Admission: RE | Admit: 2021-02-16 | Discharge: 2021-02-16 | Disposition: A | Discharge status: Home / Self Care | Payer: PPO | Source: Ambulatory Visit | Attending: Radiation Oncology | Admitting: Radiation Oncology

## 2021-02-16 ENCOUNTER — Other Ambulatory Visit: Payer: Self-pay

## 2021-02-16 DIAGNOSIS — C61 Malignant neoplasm of prostate: Secondary | ICD-10-CM | POA: Diagnosis not present

## 2021-02-16 DIAGNOSIS — Z510 Encounter for antineoplastic radiation therapy: Secondary | ICD-10-CM | POA: Diagnosis not present

## 2021-02-17 ENCOUNTER — Other Ambulatory Visit: Payer: Self-pay | Admitting: *Deleted

## 2021-02-17 ENCOUNTER — Other Ambulatory Visit: Payer: Self-pay | Admitting: Medical

## 2021-02-17 ENCOUNTER — Other Ambulatory Visit: Payer: Self-pay

## 2021-02-17 ENCOUNTER — Ambulatory Visit
Admission: RE | Admit: 2021-02-17 | Discharge: 2021-02-17 | Disposition: A | Discharge status: Home / Self Care | Payer: PPO | Source: Ambulatory Visit | Attending: Radiation Oncology | Admitting: Radiation Oncology

## 2021-02-17 DIAGNOSIS — I7121 Aneurysm of the ascending aorta, without rupture: Secondary | ICD-10-CM

## 2021-02-17 DIAGNOSIS — Z510 Encounter for antineoplastic radiation therapy: Secondary | ICD-10-CM | POA: Diagnosis not present

## 2021-02-17 DIAGNOSIS — I350 Nonrheumatic aortic (valve) stenosis: Secondary | ICD-10-CM

## 2021-02-17 DIAGNOSIS — C61 Malignant neoplasm of prostate: Secondary | ICD-10-CM | POA: Diagnosis not present

## 2021-02-17 NOTE — Progress Notes (Unsigned)
ct 

## 2021-02-18 ENCOUNTER — Ambulatory Visit
Admission: RE | Admit: 2021-02-18 | Discharge: 2021-02-18 | Disposition: A | Discharge status: Home / Self Care | Payer: PPO | Source: Ambulatory Visit | Attending: Radiation Oncology | Admitting: Radiation Oncology

## 2021-02-18 DIAGNOSIS — C61 Malignant neoplasm of prostate: Secondary | ICD-10-CM | POA: Diagnosis not present

## 2021-02-18 DIAGNOSIS — Z510 Encounter for antineoplastic radiation therapy: Secondary | ICD-10-CM | POA: Diagnosis not present

## 2021-02-19 ENCOUNTER — Ambulatory Visit
Admission: RE | Admit: 2021-02-19 | Discharge: 2021-02-19 | Disposition: A | Discharge status: Home / Self Care | Payer: PPO | Source: Ambulatory Visit | Attending: Radiation Oncology | Admitting: Radiation Oncology

## 2021-02-19 ENCOUNTER — Other Ambulatory Visit: Payer: Self-pay

## 2021-02-19 DIAGNOSIS — Z510 Encounter for antineoplastic radiation therapy: Secondary | ICD-10-CM | POA: Diagnosis not present

## 2021-02-19 DIAGNOSIS — C61 Malignant neoplasm of prostate: Secondary | ICD-10-CM | POA: Diagnosis not present

## 2021-02-22 ENCOUNTER — Other Ambulatory Visit: Payer: Self-pay | Admitting: *Deleted

## 2021-02-22 ENCOUNTER — Ambulatory Visit
Admission: RE | Admit: 2021-02-22 | Discharge: 2021-02-22 | Disposition: A | Discharge status: Home / Self Care | Payer: PPO | Source: Ambulatory Visit | Attending: Radiation Oncology | Admitting: Radiation Oncology

## 2021-02-22 ENCOUNTER — Other Ambulatory Visit: Payer: Self-pay

## 2021-02-22 DIAGNOSIS — Z510 Encounter for antineoplastic radiation therapy: Secondary | ICD-10-CM | POA: Diagnosis not present

## 2021-02-22 DIAGNOSIS — I712 Thoracic aortic aneurysm, without rupture, unspecified: Secondary | ICD-10-CM

## 2021-02-22 DIAGNOSIS — C61 Malignant neoplasm of prostate: Secondary | ICD-10-CM | POA: Diagnosis not present

## 2021-02-23 ENCOUNTER — Ambulatory Visit
Admission: RE | Admit: 2021-02-23 | Discharge: 2021-02-23 | Disposition: A | Discharge status: Home / Self Care | Payer: PPO | Source: Ambulatory Visit | Attending: Radiation Oncology | Admitting: Radiation Oncology

## 2021-02-23 ENCOUNTER — Other Ambulatory Visit: Payer: Self-pay | Admitting: Family Medicine

## 2021-02-23 DIAGNOSIS — Z510 Encounter for antineoplastic radiation therapy: Secondary | ICD-10-CM | POA: Diagnosis not present

## 2021-02-23 DIAGNOSIS — C61 Malignant neoplasm of prostate: Secondary | ICD-10-CM | POA: Diagnosis not present

## 2021-02-24 ENCOUNTER — Ambulatory Visit
Admission: RE | Admit: 2021-02-24 | Discharge: 2021-02-24 | Disposition: A | Discharge status: Home / Self Care | Payer: PPO | Source: Ambulatory Visit | Attending: Radiation Oncology | Admitting: Radiation Oncology

## 2021-02-24 DIAGNOSIS — Z510 Encounter for antineoplastic radiation therapy: Secondary | ICD-10-CM | POA: Diagnosis not present

## 2021-02-24 DIAGNOSIS — C61 Malignant neoplasm of prostate: Secondary | ICD-10-CM | POA: Diagnosis not present

## 2021-02-25 ENCOUNTER — Ambulatory Visit
Admission: RE | Admit: 2021-02-25 | Discharge: 2021-02-25 | Disposition: A | Discharge status: Home / Self Care | Payer: PPO | Source: Ambulatory Visit | Attending: Radiation Oncology | Admitting: Radiation Oncology

## 2021-02-25 DIAGNOSIS — C61 Malignant neoplasm of prostate: Secondary | ICD-10-CM | POA: Diagnosis not present

## 2021-02-25 DIAGNOSIS — Z510 Encounter for antineoplastic radiation therapy: Secondary | ICD-10-CM | POA: Diagnosis not present

## 2021-02-26 ENCOUNTER — Ambulatory Visit
Admission: RE | Admit: 2021-02-26 | Discharge: 2021-02-26 | Disposition: A | Discharge status: Home / Self Care | Payer: PPO | Source: Ambulatory Visit | Attending: Radiation Oncology | Admitting: Radiation Oncology

## 2021-02-26 ENCOUNTER — Encounter: Payer: Self-pay | Admitting: Family Medicine

## 2021-02-26 ENCOUNTER — Other Ambulatory Visit (INDEPENDENT_AMBULATORY_CARE_PROVIDER_SITE_OTHER): Payer: PPO

## 2021-02-26 ENCOUNTER — Other Ambulatory Visit: Payer: Self-pay

## 2021-02-26 DIAGNOSIS — E519 Thiamine deficiency, unspecified: Secondary | ICD-10-CM

## 2021-02-26 DIAGNOSIS — Z510 Encounter for antineoplastic radiation therapy: Secondary | ICD-10-CM | POA: Diagnosis not present

## 2021-02-26 DIAGNOSIS — C61 Malignant neoplasm of prostate: Secondary | ICD-10-CM | POA: Diagnosis not present

## 2021-02-26 DIAGNOSIS — E785 Hyperlipidemia, unspecified: Secondary | ICD-10-CM | POA: Diagnosis not present

## 2021-02-26 LAB — COMPREHENSIVE METABOLIC PANEL
ALT: 14 U/L (ref 0–53)
AST: 19 U/L (ref 0–37)
Albumin: 3.9 g/dL (ref 3.5–5.2)
Alkaline Phosphatase: 69 U/L (ref 39–117)
BUN: 19 mg/dL (ref 6–23)
CO2: 28 mEq/L (ref 19–32)
Calcium: 8.9 mg/dL (ref 8.4–10.5)
Chloride: 104 mEq/L (ref 96–112)
Creatinine, Ser: 1.26 mg/dL (ref 0.40–1.50)
GFR: 52.77 mL/min — ABNORMAL LOW (ref >60.00)
Glucose, Bld: 102 mg/dL — ABNORMAL HIGH (ref 70–99)
Potassium: 3.9 mEq/L (ref 3.5–5.1)
Sodium: 140 mEq/L (ref 135–145)
Total Bilirubin: 0.9 mg/dL (ref 0.2–1.2)
Total Protein: 6.3 g/dL (ref 6.0–8.3)

## 2021-02-26 LAB — CBC WITH DIFFERENTIAL/PLATELET
Basophils Absolute: 0 10*3/uL (ref 0.0–0.1)
Basophils Relative: 0.6 % (ref 0.0–3.0)
Eosinophils Absolute: 0.2 10*3/uL (ref 0.0–0.7)
Eosinophils Relative: 2.9 % (ref 0.0–5.0)
HCT: 40.2 % (ref 39.0–52.0)
Hemoglobin: 13.4 g/dL (ref 13.0–17.0)
Lymphocytes Relative: 11.7 % — ABNORMAL LOW (ref 12.0–46.0)
Lymphs Abs: 0.7 10*3/uL (ref 0.7–4.0)
MCHC: 33.2 g/dL (ref 30.0–36.0)
MCV: 90 fl (ref 78.0–100.0)
Monocytes Absolute: 0.9 10*3/uL (ref 0.1–1.0)
Monocytes Relative: 15.4 % — ABNORMAL HIGH (ref 3.0–12.0)
Neutro Abs: 4.1 10*3/uL (ref 1.4–7.7)
Neutrophils Relative %: 69.4 % (ref 43.0–77.0)
Platelets: 129 10*3/uL — ABNORMAL LOW (ref 150.0–400.0)
RBC: 4.47 Mil/uL (ref 4.22–5.81)
RDW: 14.8 % (ref 11.5–15.5)
WBC: 5.9 10*3/uL (ref 4.0–10.5)

## 2021-02-26 LAB — LIPID PANEL
Cholesterol: 101 mg/dL (ref 0–200)
HDL: 35.5 mg/dL — ABNORMAL LOW (ref >39.00)
LDL Cholesterol: 36 mg/dL (ref 0–99)
NonHDL: 65.16
Total CHOL/HDL Ratio: 3
Triglycerides: 147 mg/dL (ref 0.0–149.0)
VLDL: 29.4 mg/dL (ref 0.0–40.0)

## 2021-02-26 LAB — VITAMIN B12: Vitamin B-12: >1550 pg/mL — ABNORMAL HIGH (ref 211–911)

## 2021-03-01 ENCOUNTER — Ambulatory Visit
Admission: RE | Admit: 2021-03-01 | Discharge: 2021-03-01 | Disposition: A | Discharge status: Home / Self Care | Payer: PPO | Source: Ambulatory Visit | Attending: Radiation Oncology | Admitting: Radiation Oncology

## 2021-03-01 ENCOUNTER — Other Ambulatory Visit: Payer: Self-pay

## 2021-03-01 DIAGNOSIS — C61 Malignant neoplasm of prostate: Secondary | ICD-10-CM | POA: Diagnosis not present

## 2021-03-01 DIAGNOSIS — Z510 Encounter for antineoplastic radiation therapy: Secondary | ICD-10-CM | POA: Diagnosis not present

## 2021-03-02 ENCOUNTER — Ambulatory Visit (HOSPITAL_COMMUNITY): Payer: PPO

## 2021-03-02 ENCOUNTER — Ambulatory Visit
Admission: RE | Admit: 2021-03-02 | Discharge: 2021-03-02 | Disposition: A | Discharge status: Home / Self Care | Payer: PPO | Source: Ambulatory Visit | Attending: Radiation Oncology | Admitting: Radiation Oncology

## 2021-03-02 DIAGNOSIS — Z510 Encounter for antineoplastic radiation therapy: Secondary | ICD-10-CM | POA: Diagnosis not present

## 2021-03-02 DIAGNOSIS — C61 Malignant neoplasm of prostate: Secondary | ICD-10-CM | POA: Diagnosis not present

## 2021-03-03 ENCOUNTER — Other Ambulatory Visit: Payer: Self-pay

## 2021-03-03 ENCOUNTER — Ambulatory Visit (INDEPENDENT_AMBULATORY_CARE_PROVIDER_SITE_OTHER): Payer: PPO

## 2021-03-03 ENCOUNTER — Ambulatory Visit
Admission: RE | Admit: 2021-03-03 | Discharge: 2021-03-03 | Disposition: A | Discharge status: Home / Self Care | Payer: PPO | Source: Ambulatory Visit | Attending: Radiation Oncology | Admitting: Radiation Oncology

## 2021-03-03 ENCOUNTER — Other Ambulatory Visit: Payer: Self-pay | Admitting: *Deleted

## 2021-03-03 ENCOUNTER — Ambulatory Visit (HOSPITAL_COMMUNITY): Payer: PPO

## 2021-03-03 DIAGNOSIS — C61 Malignant neoplasm of prostate: Secondary | ICD-10-CM | POA: Diagnosis not present

## 2021-03-03 DIAGNOSIS — Z510 Encounter for antineoplastic radiation therapy: Secondary | ICD-10-CM | POA: Diagnosis not present

## 2021-03-03 DIAGNOSIS — I495 Sick sinus syndrome: Secondary | ICD-10-CM | POA: Diagnosis not present

## 2021-03-03 DIAGNOSIS — D696 Thrombocytopenia, unspecified: Secondary | ICD-10-CM

## 2021-03-03 LAB — CUP PACEART REMOTE DEVICE CHECK
Battery Remaining Longevity: 55 mo
Battery Remaining Percentage: 40 %
Battery Voltage: 2.95 V
Brady Statistic RV Percent Paced: 11 %
Date Time Interrogation Session: 20220629033500
Implantable Lead Implant Date: 20130528
Implantable Lead Location: 753860
Implantable Lead Model: 1948
Implantable Pulse Generator Implant Date: 20130528
Lead Channel Impedance Value: 610 Ohm
Lead Channel Pacing Threshold Amplitude: 0.5 V
Lead Channel Pacing Threshold Pulse Width: 0.4 ms
Lead Channel Sensing Intrinsic Amplitude: 11.2 mV
Lead Channel Setting Pacing Amplitude: 2.5 V
Lead Channel Setting Pacing Pulse Width: 0.4 ms
Lead Channel Setting Sensing Sensitivity: 2 mV
Pulse Gen Model: 1210
Pulse Gen Serial Number: 7328888

## 2021-03-03 LAB — VITAMIN B1: Vitamin B1 (Thiamine): 39 nmol/L — ABNORMAL HIGH (ref 8–30)

## 2021-03-04 ENCOUNTER — Other Ambulatory Visit: Payer: Self-pay

## 2021-03-04 ENCOUNTER — Ambulatory Visit
Admission: RE | Admit: 2021-03-04 | Discharge: 2021-03-04 | Disposition: A | Discharge status: Home / Self Care | Payer: PPO | Source: Ambulatory Visit | Attending: Radiation Oncology | Admitting: Radiation Oncology

## 2021-03-04 DIAGNOSIS — Z510 Encounter for antineoplastic radiation therapy: Secondary | ICD-10-CM | POA: Diagnosis not present

## 2021-03-04 DIAGNOSIS — C61 Malignant neoplasm of prostate: Secondary | ICD-10-CM | POA: Diagnosis not present

## 2021-03-05 ENCOUNTER — Other Ambulatory Visit: Payer: Self-pay

## 2021-03-05 ENCOUNTER — Ambulatory Visit
Admission: RE | Admit: 2021-03-05 | Discharge: 2021-03-05 | Disposition: A | Discharge status: Home / Self Care | Payer: PPO | Source: Ambulatory Visit | Attending: Radiation Oncology | Admitting: Radiation Oncology

## 2021-03-05 ENCOUNTER — Encounter: Payer: Self-pay | Admitting: Urology

## 2021-03-05 DIAGNOSIS — Z510 Encounter for antineoplastic radiation therapy: Secondary | ICD-10-CM | POA: Diagnosis not present

## 2021-03-05 DIAGNOSIS — C61 Malignant neoplasm of prostate: Secondary | ICD-10-CM

## 2021-03-05 DIAGNOSIS — R3911 Hesitancy of micturition: Secondary | ICD-10-CM | POA: Diagnosis not present

## 2021-03-06 ENCOUNTER — Emergency Department (HOSPITAL_BASED_OUTPATIENT_CLINIC_OR_DEPARTMENT_OTHER)
Admission: EM | Admit: 2021-03-06 | Discharge: 2021-03-06 | Disposition: A | Discharge status: Home / Self Care | Payer: PPO | Attending: Emergency Medicine | Admitting: Emergency Medicine

## 2021-03-06 ENCOUNTER — Other Ambulatory Visit: Payer: Self-pay

## 2021-03-06 ENCOUNTER — Encounter (HOSPITAL_BASED_OUTPATIENT_CLINIC_OR_DEPARTMENT_OTHER): Payer: Self-pay | Admitting: Emergency Medicine

## 2021-03-06 DIAGNOSIS — U071 COVID-19: Secondary | ICD-10-CM | POA: Insufficient documentation

## 2021-03-06 DIAGNOSIS — Z79899 Other long term (current) drug therapy: Secondary | ICD-10-CM | POA: Insufficient documentation

## 2021-03-06 DIAGNOSIS — I2510 Atherosclerotic heart disease of native coronary artery without angina pectoris: Secondary | ICD-10-CM | POA: Insufficient documentation

## 2021-03-06 DIAGNOSIS — I10 Essential (primary) hypertension: Secondary | ICD-10-CM | POA: Diagnosis not present

## 2021-03-06 DIAGNOSIS — Z8546 Personal history of malignant neoplasm of prostate: Secondary | ICD-10-CM | POA: Insufficient documentation

## 2021-03-06 DIAGNOSIS — J029 Acute pharyngitis, unspecified: Secondary | ICD-10-CM

## 2021-03-06 DIAGNOSIS — Z96651 Presence of right artificial knee joint: Secondary | ICD-10-CM | POA: Diagnosis not present

## 2021-03-06 DIAGNOSIS — Z87891 Personal history of nicotine dependence: Secondary | ICD-10-CM | POA: Insufficient documentation

## 2021-03-06 DIAGNOSIS — J028 Acute pharyngitis due to other specified organisms: Secondary | ICD-10-CM | POA: Diagnosis not present

## 2021-03-06 MED ORDER — MOLNUPIRAVIR EUA 200MG CAPSULE: 4.0000 | ORAL_CAPSULE | Freq: Two times a day (BID) | ORAL | 0 refills | Status: AC

## 2021-03-06 NOTE — ED Triage Notes (Signed)
Pt arrives pov with driver, ambulatory with report + covid test today. Pt c/o sore throat, and sinus congestion. Pt denies shob, endorses just completing radiation therapy for prostrate cancer. Pt also endorses hx of afib. Pt denies CP

## 2021-03-06 NOTE — Discharge Instructions (Addendum)
Take the molnupiravir and read the attached information regarding it. Follow-up with your primary care provider. Return to the ER if you start to experience chest pain, shortness of breath, uncontrollable vomiting, lightheadedness or dizziness.

## 2021-03-06 NOTE — ED Provider Notes (Signed)
[facility] EMERGENCY DEPARTMENT Provider Note   CSN: [id number] Arrival date & time: 03/06/21  1124     History Chief Complaint  Patient presents with   Sore Throat    Brian Davidson is a 85 y.o. male with a past medical history of hypertension, A. fib, prostate cancer status post radiation therapy presenting to the ED for sore throat, nasal congestion.  Had a positive home COVID test today.  Started experiencing symptoms about 12 hours ago.  Endorses a slight cough.  Denies any productive cough, chest pain, shortness of breath, vomiting, diarrhea.  He has not taken any medications help with his symptoms but did take his usual morning medications today. He tried salt water gargles last night.  HPI     Past Medical History:  Diagnosis Date   Arthritis    Biliary dyskinesia    Carotid artery disease ([place]) 07/01/2016   Dyslipidemia 07/13/2017   Dysrhythmia    a fib   GERD (gastroesophageal reflux disease)    occasional   H/O measles    History of chicken pox    History of kidney stones    Hypertension    Neuromuscular disorder (HCC)    neuropathy feet    Persistent atrial fibrillation (HCC)    Pneumonia 1992   Presence of permanent cardiac pacemaker    Prostate cancer ([place])    Tachycardia-bradycardia ([place])    a. s/p STJ dual chamber pacemaker   Thiamine deficiency 01/19/2018   Valvular heart disease 07/01/2016    Patient Active Problem List   Diagnosis Date Noted   Hyperglycemia 11/24/2020   Malignant neoplasm of prostate ([place]) 11/24/2020   Thoracic aortic aneurysm ([place]) 05/27/2020   Primary osteoarthritis of right knee 12/23/2019   Symptomatic bradycardia 05/31/2019   Vertigo 07/27/2018   Multiple closed fractures of ribs of right side 01/21/2018   Thiamine deficiency 01/19/2018   Peripheral neuropathy 07/13/2017   Hyperlipidemia 07/13/2017   Preventative health care 01/11/2017   Carotid artery disease ([place]) 07/01/2016   Mitral valve disorder  07/01/2016   Low back pain 12/31/2015   Osteopenia 12/31/2015   Allergic state 12/31/2015   Skin lesion 12/31/2015   History of chicken pox    Calculi, ureter 03/30/2015   OA (osteoarthritis) of knee 05/10/2014   Pacemaker-[employer] 02/01/2012   Permanent atrial fibrillation ([place])    Hypertension    Heart murmur    Chronic anticoagulation     Past Surgical History:  Procedure Laterality Date   CHOLECYSTECTOMY N/A 11/24/2015   Procedure: LAPAROSCOPIC CHOLECYSTECTOMY;  Surgeon: Greer Pickerel, MD;  Location: [facility] ORS;  Service: General;  Laterality: N/A;   CYSTOSCOPY  2016   EYE SURGERY     bil cataract   FRACTURE SURGERY  2005   left ankle   HERNIA REPAIR     left groin, no mesh   PERMANENT PACEMAKER INSERTION N/A 01/31/2012   SJM Accent SR RF implanted by DR Allred   TOTAL KNEE ARTHROPLASTY Right 12/23/2019   Procedure: TOTAL KNEE ARTHROPLASTY;  Surgeon: Gaynelle Arabian, MD;  Location: [facility];  Service: Orthopedics;  Laterality: Right;  54min       Family History  Problem Relation Age of Onset   Hypertension Mother    Arthritis Mother    Drug abuse Mother    Stroke Father    Heart disease Father    Prostate cancer Father        not treated   Heart disease Sister 25   Heart  disease Brother    Hypertension Son    Heart disease Sister    Neuropathy Brother    Hypertension Brother    Atrial fibrillation Brother    Atrial fibrillation Sister    Colon cancer Neg Hx     Social History   Tobacco Use   Smoking status: Former    Pack years: 0.00    Types: Pipe, Cigars    Quit date: 11/18/1985    Years since quitting: 35.3   Smokeless tobacco: Never  Vaping Use   Vaping Use: Never used  Substance Use Topics   Alcohol use: Yes    Alcohol/week: 3.0 standard drinks    Types: 3 Cans of beer per week    Comment: occ   Drug use: No    Home Medications Prior to Admission medications   Medication Sig Start Date End Date Taking? Authorizing Provider  molnupiravir EUA 200  mg CAPS Take 4 capsules (800 mg total) by mouth 2 (two) times daily for 5 days. 03/06/21 03/11/21 Yes Tillie Viverette, PA-C  Biotin 5000 MCG TABS Take 5,000 mg by mouth daily.    [provider]  Calcium Carbonate-Vitamin D (CALTRATE 600+D PO) Take 600 mg by mouth daily.     [provider]  Cholecalciferol 50 MCG (2000 UT) TABS Take 2,000 Units by mouth daily.     [provider]  Cyanocobalamin (VITAMIN B-12) 5000 MCG TBDP Take by mouth every other day.    [provider]  diltiazem (CARDIZEM CD) 240 MG 24 hr capsule Take 1 capsule (240 mg total) by mouth daily. 10/05/20   Mosie Lukes, MD  gabapentin (NEURONTIN) 300 MG capsule Take 1 capsule by mouth twice daily 02/24/21   Mosie Lukes, MD  magnesium oxide (MAG-OX) 400 MG tablet Take 400 mg by mouth daily.    [provider]  metoprolol tartrate (LOPRESSOR) 100 MG tablet Take 1 tablet (100 mg total) by mouth 2 (two) times daily. 12/09/20   Mosie Lukes, MD  rivaroxaban (XARELTO) 20 MG TABS tablet Take 1 tablet (20 mg total) by mouth daily. 05/26/20   Mosie Lukes, MD  rosuvastatin (CRESTOR) 10 MG tablet Take 1 tablet (10 mg total) by mouth daily. 12/15/20   Jettie Booze, MD  silodosin (RAPAFLO) 8 MG CAPS capsule Take 1 capsule (8 mg total) by mouth daily with supper. 01/28/21   Bruning, Ashlyn, PA-C  thiamine 100 MG tablet Take 100 mg by mouth every other day.    [provider]  Turmeric 500 MG TABS Take 500 mg by mouth daily.     [provider]    Allergies    Cardizem [diltiazem] and Sulfa antibiotics  Review of Systems   Review of Systems  Constitutional:  Negative for chills and fever.  HENT:  Positive for congestion and sore throat.   Respiratory:  Positive for cough. Negative for shortness of breath.   Cardiovascular:  Negative for chest pain.  Gastrointestinal:  Negative for diarrhea and vomiting.   Physical Exam Updated Vital Signs BP (!) 150/95 (BP  Location: Right Arm)   Pulse 84   Temp 98.5 F (36.9 C) (Oral)   Resp 18   Ht 6' (1.829 m)   Wt 100.2 kg   SpO2 96%   BMI 29.97 kg/m   Physical Exam Vitals and nursing note reviewed.  Constitutional:      General: He is not in acute distress.    Appearance: He is well-developed.  HENT:     Head: Normocephalic and atraumatic.     Nose: Nose normal.     Mouth/Throat:     Pharynx: No posterior oropharyngeal erythema.     Tonsils: No tonsillar exudate.     Comments: Patient does not appear to be in acute distress. No trismus or drooling present. No pooling of secretions. Patient is tolerating secretions and is not in respiratory distress. No neck pain or tenderness to palpation of the neck. Full active and passive range of motion of the neck. No evidence of RPA or PTA. Eyes:     General: No scleral icterus.       Right eye: No discharge.        Left eye: No discharge.     Conjunctiva/sclera: Conjunctivae normal.  Cardiovascular:     Rate and Rhythm: Normal rate and regular rhythm.     Heart sounds: Normal heart sounds. No murmur heard.   No friction rub. No gallop.  Pulmonary:     Effort: Pulmonary effort is normal. No respiratory distress.     Breath sounds: Normal breath sounds.  Abdominal:     General: Bowel sounds are normal. There is no distension.     Palpations: Abdomen is soft.     Tenderness: There is no abdominal tenderness. There is no guarding.  Musculoskeletal:        General: Normal range of motion.     Cervical back: Normal range of motion and neck supple.  Skin:    General: Skin is warm and dry.     Findings: No rash.  Neurological:     Mental Status: He is alert.     Motor: No abnormal muscle tone.     Coordination: Coordination normal.    ED Results / Procedures / Treatments   Labs (all labs ordered are listed, but only abnormal results are displayed) Labs Reviewed - No data to display  EKG None  Radiology No results  found.  Procedures Procedures   Medications Ordered in ED Medications - No data to display  ED Course  I have reviewed the triage vital signs and the nursing notes.  Pertinent labs & imaging results that were available during my care of the patient were reviewed by me and considered in my medical decision making (see chart for details).  Clinical Course as of 03/06/21 1307  Sat Mar 06, 2021  1200 SpO2: 96 % [HK]    Clinical Course User Index [HK] Delia Heady, PA-C   MDM Rules/Calculators/A&P                          CHAUNCE WINKELS was evaluated in Emergency Department on 03/06/21 for the symptoms described in the history of present illness. He/she was evaluated in the context of the global COVID-19 pandemic, which necessitated consideration that the patient might be at risk for infection with the SARS-CoV-2 virus that causes COVID-19. Institutional protocols and algorithms that pertain to the evaluation of patients at risk for COVID-19 are in a state of rapid change based on information released by regulatory bodies including the CDC and federal and state organizations. These policies and algorithms were followed during the patient's care in the ED.  85 year old male with past medical history of prostate cancer status postradiation therapy, A. fib, hypertension presenting to the ED with a chief complaint of sore throat and nasal congestion.  Symptoms began last night.  Had a positive home COVID test today.  Denies any shortness of breath, chest pain, vomiting or diarrhea.  He reports cough but no cough noted on my exam.  Lung sounds are clear bilaterally.  Oxygen saturations above 95% on room air without signs of respiratory distress. Patient is eligible for antiviral therapy based on his risk factors and age.  I spoke to [facility] ED pharmacist over the phone and we agree that molnupiravir would be the best option as paxlovid. appears to interact with his Xarelto.  Urged patient to start  this medication today and to speak to pharmacist when he picks up the medication for further information.  At this time there is no signs of respiratory distress or concerns that would require further work-up for his COVID.  Patient agrees.  We will have him follow-up with his primary care provider.  Return precautions given   Patient is hemodynamically stable, in NAD, and able to ambulate in the ED. Evaluation does not show pathology that would require ongoing emergent intervention or inpatient treatment. I explained the diagnosis to the patient. Pain has been managed and has no complaints prior to discharge. Patient is comfortable with above plan and is stable for discharge at this time. All questions were answered prior to disposition. Strict return precautions for returning to the ED were discussed. Encouraged follow up with PCP.   An After Visit Summary was printed and given to the patient.   Portions of this note were generated with [employer]. Dictation errors may occur despite best attempts at proofreading.  Final Clinical Impression(s) / ED Diagnoses Final diagnoses:  Viral pharyngitis  COVID-19 virus infection    Rx / DC Orders ED Discharge Orders          Ordered    molnupiravir EUA 200 mg CAPS  2 times daily        03/06/21 1258             Delia Heady, PA-C 03/06/21 1307    Blanchie Dessert, MD 03/06/21 1405

## 2021-03-08 ENCOUNTER — Encounter: Payer: Self-pay | Admitting: Medical

## 2021-03-09 ENCOUNTER — Telehealth: Payer: Self-pay

## 2021-03-09 NOTE — Telephone Encounter (Signed)
Received message from nursing secretary that patient would like to speak with a nurse regarding frequent urination.   Returned patient's call and asked for more details on patient's concern. Patient states he finished radiation to his prostate on Friday 03/05/21. That evening he started feeling poorly. He went to bed, and when he woke up his symptoms had not improved, so he took a home COVID test. Home test was positive and he then proceeded to ED for work up. Reports he was prescribed a new medication to take for 5 days to help manage symptoms (per chart review, appears to be molnupiravir EUA 200mg ). Reports he has been urinating more frequently since completing radiation and starting this new medication. Informed patient that Dr. Tammi Klippel and Ailene Ards PA-C were out of the office this week, so it would be beneficial to reach out to his urologist's office for advice/direction. Also encouraged patient to reach out to PCP's office to update them on recent COVID diagnosis so they can arrange follow-up.   Patient verbalized understanding and agreement of plan, and denied any other needs at this time. Provided my direct call back number should patient have any other questions/concerns.

## 2021-03-11 ENCOUNTER — Other Ambulatory Visit: Payer: PPO

## 2021-03-11 ENCOUNTER — Telehealth: Payer: PPO | Admitting: Family Medicine

## 2021-03-11 ENCOUNTER — Other Ambulatory Visit: Payer: Self-pay

## 2021-03-11 ENCOUNTER — Other Ambulatory Visit: Payer: Self-pay | Admitting: *Deleted

## 2021-03-11 MED ORDER — FLUTICASONE PROPIONATE 50 MCG/ACT NA SUSP: 2.0000 | Freq: Every day | NASAL | 1 refills | Status: DC

## 2021-03-11 NOTE — Telephone Encounter (Signed)
Patient was put on an antiviral in the ER.  With your message he wanted to know if he needed to be on something else after he finishes the antiviral the ER put him on.  I did send in the flonase.  He felt like he did not need a visit with you.  He feels a lot better, no fever and not as congested.

## 2021-03-14 ENCOUNTER — Encounter: Payer: Self-pay | Admitting: Family Medicine

## 2021-03-15 NOTE — Telephone Encounter (Signed)
He is still testing every day. Would you like to get him in as a virtual tom at 1 or I can get him in to speak with someone else?

## 2021-03-15 NOTE — Telephone Encounter (Signed)
Spoke with patient last week and advised him that he does not need anything.  He stated he will test until he is negative.  Advised him that he could have a positive test for up to about 3 months.

## 2021-03-16 ENCOUNTER — Telehealth (INDEPENDENT_AMBULATORY_CARE_PROVIDER_SITE_OTHER): Payer: PPO | Admitting: Family Medicine

## 2021-03-16 ENCOUNTER — Other Ambulatory Visit: Payer: Self-pay

## 2021-03-16 DIAGNOSIS — M79672 Pain in left foot: Secondary | ICD-10-CM | POA: Diagnosis not present

## 2021-03-16 DIAGNOSIS — L84 Corns and callosities: Secondary | ICD-10-CM | POA: Diagnosis not present

## 2021-03-16 DIAGNOSIS — R739 Hyperglycemia, unspecified: Secondary | ICD-10-CM | POA: Diagnosis not present

## 2021-03-16 DIAGNOSIS — I10 Essential (primary) hypertension: Secondary | ICD-10-CM | POA: Diagnosis not present

## 2021-03-16 DIAGNOSIS — B351 Tinea unguium: Secondary | ICD-10-CM | POA: Diagnosis not present

## 2021-03-16 DIAGNOSIS — U071 COVID-19: Secondary | ICD-10-CM

## 2021-03-16 DIAGNOSIS — M79671 Pain in right foot: Secondary | ICD-10-CM | POA: Diagnosis not present

## 2021-03-16 MED ORDER — MULTI-VITAMIN/MINERALS PO TABS: 1.0000 | ORAL_TABLET | Freq: Every day | ORAL | 3 refills | Status: AC

## 2021-03-16 MED ORDER — CEFDINIR 300 MG PO CAPS: 300.0000 mg | ORAL_CAPSULE | Freq: Two times a day (BID) | ORAL | 0 refills | Status: AC

## 2021-03-16 MED ORDER — GUAIFENESIN ER 600 MG PO TB12: 600.0000 mg | ORAL_TABLET | Freq: Two times a day (BID) | ORAL | 0 refills | Status: DC

## 2021-03-16 NOTE — Telephone Encounter (Signed)
Patient scheduled for today at 1pm for vv

## 2021-03-17 DIAGNOSIS — U071 COVID-19: Secondary | ICD-10-CM | POA: Insufficient documentation

## 2021-03-17 NOTE — Assessment & Plan Note (Signed)
Monitor and report any concerns, no changes to meds. Encouraged heart healthy diet such as the DASH diet and exercise as tolerated.  ?

## 2021-03-17 NOTE — Assessment & Plan Note (Signed)
hgba1c acceptable, minimize simple carbs. Increase exercise as tolerated.  

## 2021-03-17 NOTE — Assessment & Plan Note (Addendum)
With initial diagnosis he was treated with Molnupiravir at ER and he was feeling better. He notes his congestion, cough and malaise have worsened a bit again. He is encouraged to increase rest and hydration, add probiotics. Treat fevers as needed. He is sent in a prescription to use only if he spikes notable fevers or gets notably worse symptomatically. Start Mucinex bid and MV with minerals

## 2021-03-17 NOTE — Progress Notes (Addendum)
MyChart Phone Visit    Virtual Visit via Phone Note   This visit type was conducted due to national recommendations for restrictions regarding the COVID-19 Pandemic (e.g. social distancing) in an effort to limit this patient's exposure and mitigate transmission in our community. This patient is at least at moderate risk for complications without adequate follow up. This format is felt to be most appropriate for this patient at this time. Physical exam was limited by quality of audio technology used for the visit. S Chism, CMA was able to get the patient set up on a phone visit.  Patient location: home Patient and provider in visit Provider location: Office  I discussed the limitations of evaluation and management by telemedicine and the availability of in person appointments. The patient expressed understanding and agreed to proceed.  Visit Date: 03/16/2021  Today's healthcare provider: [name], MD     Subjective:    Patient ID: Brian Davidson, male    DOB: Jan 09, 1936, 85 y.o.   MRN: [id number]  Chief Complaint  Patient presents with   Covid Exposure    Pt is still having issues with being positive.    HPI Patient is in today for follow up on a COVID diagnosis. He was treated with Molnupiravir from the ER the weekend before last. He had been feeling better but he is noting an increase in malaise and congestion with some residual cough this week. Denies CP/palp/SOB/HA/fevers/GI or GU c/o. Taking meds as prescribed   Past Medical History:  Diagnosis Date   Arthritis    Biliary dyskinesia    Carotid artery disease ([place]) 07/01/2016   Dyslipidemia 07/13/2017   Dysrhythmia    a fib   GERD (gastroesophageal reflux disease)    occasional   H/O measles    History of chicken pox    History of kidney stones    Hypertension    Neuromuscular disorder (HCC)    neuropathy feet    Persistent atrial fibrillation (HCC)    Pneumonia 1992   Presence of permanent cardiac  pacemaker    Prostate cancer ([place])    Tachycardia-bradycardia ([place])    a. s/p STJ dual chamber pacemaker   Thiamine deficiency 01/19/2018   Valvular heart disease 07/01/2016    Past Surgical History:  Procedure Laterality Date   CHOLECYSTECTOMY N/A 11/24/2015   Procedure: LAPAROSCOPIC CHOLECYSTECTOMY;  Surgeon: Greer Pickerel, MD;  Location: [facility];  Service: General;  Laterality: N/A;   CYSTOSCOPY  2016   EYE SURGERY     bil cataract   FRACTURE SURGERY  2005   left ankle   HERNIA REPAIR     left groin, no mesh   PERMANENT PACEMAKER INSERTION N/A 01/31/2012   SJM [employer] SR RF implanted by DR Allred   TOTAL KNEE ARTHROPLASTY Right 12/23/2019   Procedure: TOTAL KNEE ARTHROPLASTY;  Surgeon: Gaynelle Arabian, MD;  Location: [facility];  Service: Orthopedics;  Laterality: Right;  36min    Family History  Problem Relation Age of Onset   Hypertension Mother    Arthritis Mother    Drug abuse Mother    Stroke Father    Heart disease Father    Prostate cancer Father        not treated   Heart disease Sister 78   Heart disease Brother    Hypertension Son    Heart disease Sister    Neuropathy Brother    Hypertension Brother    Atrial fibrillation Brother    Atrial fibrillation  Sister    Colon cancer Neg Hx     Social History   Socioeconomic History   Marital status: Married    Spouse name: [name]   Number of children: 1   Years of education: Not on file   Highest education level: Not on file  Occupational History   Occupation: Retired   Occupation: Dance movement psychotherapist for [employer]  Tobacco Use   Smoking status: Former    Types: Pipe, Cigars    Quit date: 11/18/1985    Years since quitting: 35.3   Smokeless tobacco: Never  Vaping Use   Vaping Use: Never used  Substance and Sexual Activity   Alcohol use: Yes    Alcohol/week: 3.0 standard drinks    Types: 3 Cans of beer per week    Comment: occ   Drug use: No   Sexual activity: Not Currently  Other Topics Concern   Not on  file  Social History Narrative   Lives with wife, still works part-time as a Geophysicist/field seismologist. He is active around the house and yard...   Parents are deceased and did not have cardiac issues but he has 2 siblings with atrial fibrillation and a sister-in-law  has a pacemaker.   Social Determinants of Health   Financial Resource Strain: Low Risk    Difficulty of Paying Living Expenses: Not very hard  Food Insecurity: Not on file  Transportation Needs: No Transportation Needs   Lack of Transportation (Medical): No   Lack of Transportation (Non-Medical): No  Physical Activity: Sufficiently Active   Days of Exercise per Week: 7 days   Minutes of Exercise per Session: 30 min  Stress: Not on file  Social Connections: Not on file  Intimate Partner Violence: Not on file    Outpatient Medications Prior to Visit  Medication Sig Dispense Refill   Biotin 5000 MCG TABS Take 5,000 mg by mouth daily.     Calcium Carbonate-Vitamin D (CALTRATE 600+D PO) Take 600 mg by mouth daily.      Cholecalciferol 50 MCG (2000 UT) TABS Take 2,000 Units by mouth daily.      Cyanocobalamin (VITAMIN B-12) 5000 MCG TBDP Take by mouth every other day.     diltiazem (CARDIZEM CD) 240 MG 24 hr capsule Take 1 capsule (240 mg total) by mouth daily. 90 capsule 1   fluticasone (FLONASE) 50 MCG/ACT nasal spray Place 2 sprays into both nostrils daily. 16 g 1   gabapentin (NEURONTIN) 300 MG capsule Take 1 capsule by mouth twice daily 180 capsule 1   magnesium oxide (MAG-OX) 400 MG tablet Take 400 mg by mouth daily.     metoprolol tartrate (LOPRESSOR) 100 MG tablet Take 1 tablet (100 mg total) by mouth 2 (two) times daily. 180 tablet 1   rivaroxaban (XARELTO) 20 MG TABS tablet Take 1 tablet (20 mg total) by mouth daily. 90 tablet 1   rosuvastatin (CRESTOR) 10 MG tablet Take 1 tablet (10 mg total) by mouth daily. 90 tablet 3   thiamine 100 MG tablet Take 100 mg by mouth every other day.     Turmeric 500 MG TABS Take 500 mg by mouth  daily.      silodosin (RAPAFLO) 8 MG CAPS capsule Take 1 capsule (8 mg total) by mouth daily with supper. 30 capsule 2   No facility-administered medications prior to visit.    Allergies  Allergen Reactions   Cardizem [Diltiazem]     Rash from Yellow dye in generic capsule.   Can take  different brand. Takes diltiazem - his allergy is to Cardizem   Sulfa Antibiotics     Unknown childhood reaction    Review of Systems  Constitutional:  Positive for malaise/fatigue. Negative for fever.  HENT:  Positive for congestion.   Eyes:  Negative for blurred vision.  Respiratory:  Positive for cough. Negative for shortness of breath.   Cardiovascular:  Negative for chest pain, palpitations and leg swelling.  Gastrointestinal:  Negative for abdominal pain, blood in stool and nausea.  Genitourinary:  Negative for dysuria and frequency.  Musculoskeletal:  Negative for falls.  Skin:  Negative for rash.  Neurological:  Negative for dizziness, loss of consciousness and headaches.  Endo/Heme/Allergies:  Negative for environmental allergies.  Psychiatric/Behavioral:  Negative for depression. The patient is not nervous/anxious.       Objective:    Physical Exam unable to obtain via phone visit.  There were no vitals taken for this visit. Wt Readings from Last 3 Encounters:  03/06/21 221 lb (100.2 kg)  12/22/20 227 lb (103 kg)  12/15/20 219 lb 4.2 oz (99.5 kg)    Diabetic Foot Exam - Simple   No data filed    Lab Results  Component Value Date   WBC 5.9 02/26/2021   HGB 13.4 02/26/2021   HCT 40.2 02/26/2021   PLT 129.0 (L) 02/26/2021   GLUCOSE 102 (H) 02/26/2021   CHOL 101 02/26/2021   TRIG 147.0 02/26/2021   HDL 35.50 (L) 02/26/2021   LDLCALC 36 02/26/2021   ALT 14 02/26/2021   AST 19 02/26/2021   NA 140 02/26/2021   K 3.9 02/26/2021   CL 104 02/26/2021   CREATININE 1.26 02/26/2021   BUN 19 02/26/2021   CO2 28 02/26/2021   TSH 2.01 11/24/2020   INR 1.5 (H) 12/16/2019    HGBA1C 5.7 11/24/2020    Lab Results  Component Value Date   TSH 2.01 11/24/2020   Lab Results  Component Value Date   WBC 5.9 02/26/2021   HGB 13.4 02/26/2021   HCT 40.2 02/26/2021   MCV 90.0 02/26/2021   PLT 129.0 (L) 02/26/2021   Lab Results  Component Value Date   NA 140 02/26/2021   K 3.9 02/26/2021   CO2 28 02/26/2021   GLUCOSE 102 (H) 02/26/2021   BUN 19 02/26/2021   CREATININE 1.26 02/26/2021   BILITOT 0.9 02/26/2021   ALKPHOS 69 02/26/2021   AST 19 02/26/2021   ALT 14 02/26/2021   PROT 6.3 02/26/2021   ALBUMIN 3.9 02/26/2021   CALCIUM 8.9 02/26/2021   ANIONGAP 9 12/24/2019   GFR 52.77 (L) 02/26/2021   Lab Results  Component Value Date   CHOL 101 02/26/2021   Lab Results  Component Value Date   HDL 35.50 (L) 02/26/2021   Lab Results  Component Value Date   LDLCALC 36 02/26/2021   Lab Results  Component Value Date   TRIG 147.0 02/26/2021   Lab Results  Component Value Date   CHOLHDL 3 02/26/2021   Lab Results  Component Value Date   HGBA1C 5.7 11/24/2020       Assessment & Plan:   Problem List Items Addressed This Visit     Hypertension    Monitor and report any concerns, no changes to meds. Encouraged heart healthy diet such as the DASH diet and exercise as tolerated.        Hyperglycemia    hgba1c acceptable, minimize simple carbs. Increase exercise as tolerated.  COVID-19    With initial diagnosis he was treated with Molnupiravir at ER and he was feeling better. He notes his congestion, cough and malaise have worsened a bit again. He is encouraged to increase rest and hydration, add probiotics. Treat fevers as needed. He is sent in a prescription to use only if he spikes notable fevers or gets notably worse symptomatically. Start Mucinex bid and MV with minerals       Relevant Medications   cefdinir (OMNICEF) 300 MG capsule    I have discontinued Brian Davidson "[name]"'s silodosin. I am also having him start on  cefdinir, guaiFENesin, and multivitamin with minerals. Additionally, I am having him maintain his Turmeric, magnesium oxide, Cholecalciferol, Calcium Carbonate-Vitamin D (CALTRATE 600+D PO), thiamine, Biotin, rivaroxaban, Vitamin B-12, diltiazem, metoprolol tartrate, rosuvastatin, gabapentin, and fluticasone.  Meds ordered this encounter  Medications   cefdinir (OMNICEF) 300 MG capsule    Sig: Take 1 capsule (300 mg total) by mouth 2 (two) times daily for 10 days.    Dispense:  20 capsule    Refill:  0   guaiFENesin (MUCINEX) 600 MG 12 hr tablet    Sig: Take 1 tablet (600 mg total) by mouth 2 (two) times daily.    Dispense:  20 tablet    Refill:  0   Multiple Vitamins-Minerals (MULTIVITAMIN WITH MINERALS) tablet    Sig: Take 1 tablet by mouth daily.    Dispense:  30 tablet    Refill:  3    I discussed the assessment and treatment plan with the patient. The patient was provided an opportunity to ask questions and all were answered. The patient agreed with the plan and demonstrated an understanding of the instructions.   The patient was advised to call back or seek an in-person evaluation if the symptoms worsen or if the condition fails to improve as anticipated.  I provided 21 minutes of face-to-face time during this encounter.   [name], MD [facility] [phone] (phone) [phone] (fax)  [place]

## 2021-03-19 ENCOUNTER — Other Ambulatory Visit: Payer: Self-pay

## 2021-03-19 ENCOUNTER — Other Ambulatory Visit (INDEPENDENT_AMBULATORY_CARE_PROVIDER_SITE_OTHER): Payer: PPO

## 2021-03-19 DIAGNOSIS — D696 Thrombocytopenia, unspecified: Secondary | ICD-10-CM | POA: Diagnosis not present

## 2021-03-19 LAB — CBC WITH DIFFERENTIAL/PLATELET
Basophils Absolute: 0.1 10*3/uL (ref 0.0–0.1)
Basophils Relative: 1.3 % (ref 0.0–3.0)
Eosinophils Absolute: 0.3 10*3/uL (ref 0.0–0.7)
Eosinophils Relative: 3.6 % (ref 0.0–5.0)
HCT: 39.7 % (ref 39.0–52.0)
Hemoglobin: 13.5 g/dL (ref 13.0–17.0)
Lymphocytes Relative: 13.1 % (ref 12.0–46.0)
Lymphs Abs: 0.9 10*3/uL (ref 0.7–4.0)
MCHC: 34.1 g/dL (ref 30.0–36.0)
MCV: 87.7 fl (ref 78.0–100.0)
Monocytes Absolute: 1 10*3/uL (ref 0.1–1.0)
Monocytes Relative: 13.9 % — ABNORMAL HIGH (ref 3.0–12.0)
Neutro Abs: 4.8 10*3/uL (ref 1.4–7.7)
Neutrophils Relative %: 68.1 % (ref 43.0–77.0)
Platelets: 165 10*3/uL (ref 150.0–400.0)
RBC: 4.52 Mil/uL (ref 4.22–5.81)
RDW: 15 % (ref 11.5–15.5)
WBC: 7.1 10*3/uL (ref 4.0–10.5)

## 2021-03-23 DIAGNOSIS — H6123 Impacted cerumen, bilateral: Secondary | ICD-10-CM | POA: Diagnosis not present

## 2021-03-23 NOTE — Progress Notes (Signed)
Remote pacemaker transmission.   

## 2021-03-24 ENCOUNTER — Other Ambulatory Visit: Payer: Self-pay | Admitting: Family Medicine

## 2021-03-24 DIAGNOSIS — C61 Malignant neoplasm of prostate: Secondary | ICD-10-CM | POA: Diagnosis not present

## 2021-03-29 DIAGNOSIS — C61 Malignant neoplasm of prostate: Secondary | ICD-10-CM | POA: Diagnosis not present

## 2021-03-30 ENCOUNTER — Ambulatory Visit
Admission: RE | Admit: 2021-03-30 | Discharge: 2021-03-30 | Disposition: A | Discharge status: Home / Self Care | Payer: PPO | Source: Ambulatory Visit | Attending: Cardiothoracic Surgery | Admitting: Cardiothoracic Surgery

## 2021-03-30 ENCOUNTER — Inpatient Hospital Stay: Admission: RE | Admit: 2021-03-30 | Payer: PPO | Source: Ambulatory Visit

## 2021-03-30 DIAGNOSIS — I2510 Atherosclerotic heart disease of native coronary artery without angina pectoris: Secondary | ICD-10-CM | POA: Diagnosis not present

## 2021-03-30 DIAGNOSIS — J90 Pleural effusion, not elsewhere classified: Secondary | ICD-10-CM | POA: Diagnosis not present

## 2021-03-30 DIAGNOSIS — I712 Thoracic aortic aneurysm, without rupture, unspecified: Secondary | ICD-10-CM

## 2021-03-30 DIAGNOSIS — I281 Aneurysm of pulmonary artery: Secondary | ICD-10-CM | POA: Diagnosis not present

## 2021-03-30 MED ORDER — IOPAMIDOL (ISOVUE-370) INJECTION 76%
75.0000 mL | Freq: Once | INTRAVENOUS | Status: AC | PRN
  Administered 2021-03-30: 75 mL via INTRAVENOUS

## 2021-03-31 ENCOUNTER — Other Ambulatory Visit: Payer: Self-pay | Admitting: Family Medicine

## 2021-04-01 ENCOUNTER — Other Ambulatory Visit: Payer: Self-pay

## 2021-04-01 ENCOUNTER — Ambulatory Visit: Payer: PPO | Admitting: Cardiothoracic Surgery

## 2021-04-01 VITALS — BP 140/70 | HR 67 | Resp 20 | Ht 72.0 in | Wt 222.0 lb

## 2021-04-01 DIAGNOSIS — I712 Thoracic aortic aneurysm, without rupture: Secondary | ICD-10-CM

## 2021-04-01 DIAGNOSIS — I7121 Aneurysm of the ascending aorta, without rupture: Secondary | ICD-10-CM

## 2021-04-05 DIAGNOSIS — H02054 Trichiasis without entropian left upper eyelid: Secondary | ICD-10-CM | POA: Diagnosis not present

## 2021-04-05 DIAGNOSIS — H04123 Dry eye syndrome of bilateral lacrimal glands: Secondary | ICD-10-CM | POA: Diagnosis not present

## 2021-04-08 DIAGNOSIS — L89892 Pressure ulcer of other site, stage 2: Secondary | ICD-10-CM | POA: Diagnosis not present

## 2021-04-08 NOTE — Progress Notes (Signed)
Chief complaint: Aneurysm surveillance  History present illness: Patient returns for annual evaluation of ascending aortic aneurysm.  He has been asymptomatic since last visitation.  He has undergone therapy for prostate cancer and is doing well  Active Ambulatory Problems    Diagnosis Date Noted   Permanent atrial fibrillation ([place])    Hypertension    Heart murmur    Chronic anticoagulation    Pacemaker-[employer] 02/01/2012   OA (osteoarthritis) of knee 05/10/2014   Calculi, ureter 03/30/2015   Low back pain 12/31/2015   History of chicken pox    Osteopenia 12/31/2015   Allergic state 12/31/2015   Skin lesion 12/31/2015   Carotid artery disease ([place]) 07/01/2016   Mitral valve disorder 07/01/2016   Preventative health care 01/11/2017   Peripheral neuropathy 07/13/2017   Hyperlipidemia 07/13/2017   Thiamine deficiency 01/19/2018   Multiple closed fractures of ribs of right side 01/21/2018   Vertigo 07/27/2018   Symptomatic bradycardia 05/31/2019   Primary osteoarthritis of right knee 12/23/2019   Thoracic aortic aneurysm ([place]) 05/27/2020   Hyperglycemia 11/24/2020   Malignant neoplasm of prostate ([place]) 11/24/2020   COVID-19 03/17/2021   Resolved Ambulatory Problems    Diagnosis Date Noted   Tachycardia-bradycardia syndrome ([place]) 01/31/2012   A-fib ([place]) 05/10/2014   Blood in the urine 04/07/2015   Gonalgia 05/10/2014   URI (upper respiratory infection) 01/21/2018   Past Medical History:  Diagnosis Date   Arthritis    Biliary dyskinesia    Dyslipidemia 07/13/2017   Dysrhythmia    GERD (gastroesophageal reflux disease)    H/O measles    History of kidney stones    Neuromuscular disorder (HCC)    Persistent atrial fibrillation ([place])    Pneumonia 1992   Presence of permanent cardiac pacemaker    Prostate cancer ([place])    Tachycardia-bradycardia ([place])    Valvular heart disease 07/01/2016     Current Outpatient Medications on File Prior to Visit  Medication Sig  Dispense Refill   Biotin 5000 MCG TABS Take 5,000 mg by mouth daily.     Calcium Carbonate-Vitamin D (CALTRATE 600+D PO) Take 600 mg by mouth daily.      Cholecalciferol 50 MCG (2000 UT) TABS Take 2,000 Units by mouth daily.      Cyanocobalamin (VITAMIN B-12) 5000 MCG TBDP Take by mouth every other day.     diltiazem (CARDIZEM CD) 240 MG 24 hr capsule TAKE ONE (1) CAPSULE EACH DAY BY MOUTH 90 capsule 1   fluticasone (FLONASE) 50 MCG/ACT nasal spray Place 2 sprays into both nostrils daily. 16 g 1   gabapentin (NEURONTIN) 300 MG capsule Take 1 capsule by mouth twice daily 180 capsule 1   guaiFENesin (MUCINEX) 600 MG 12 hr tablet Take 1 tablet (600 mg total) by mouth 2 (two) times daily. 20 tablet 0   magnesium oxide (MAG-OX) 400 MG tablet Take 400 mg by mouth daily.     metoprolol tartrate (LOPRESSOR) 100 MG tablet Take 1 tablet (100 mg total) by mouth 2 (two) times daily. 180 tablet 1   Multiple Vitamins-Minerals (MULTIVITAMIN WITH MINERALS) tablet Take 1 tablet by mouth daily. 30 tablet 3   rosuvastatin (CRESTOR) 10 MG tablet Take 1 tablet (10 mg total) by mouth daily. 90 tablet 3   thiamine 100 MG tablet Take 100 mg by mouth every other day.     Turmeric 500 MG TABS Take 500 mg by mouth daily.      XARELTO 20 MG TABS tablet Take 1 tablet by  mouth once daily 90 tablet 1   No current facility-administered medications on file prior to visit.  Physical exam: Well-appearing no acute distress Clear to auscultation bilaterally Regular rate and rhythm no murmur No peripheral edema  Imaging: I personally reviewed his CT scan from 03/30/2021 which shows a stable ascending aortic aneurysm that is less than 5 cm in maximal diameter   Impression/plan: Stable ascending aortic aneurysm Follow-up with repeat imaging in 1 year  Brian Davidson Z. Brian Davidson, [place]

## 2021-04-21 ENCOUNTER — Telehealth: Payer: Self-pay | Admitting: Interventional Cardiology

## 2021-04-21 ENCOUNTER — Ambulatory Visit (INDEPENDENT_AMBULATORY_CARE_PROVIDER_SITE_OTHER): Payer: PPO | Admitting: Pharmacist

## 2021-04-21 DIAGNOSIS — E785 Hyperlipidemia, unspecified: Secondary | ICD-10-CM | POA: Diagnosis not present

## 2021-04-21 DIAGNOSIS — I10 Essential (primary) hypertension: Secondary | ICD-10-CM | POA: Diagnosis not present

## 2021-04-21 DIAGNOSIS — Z7901 Long term (current) use of anticoagulants: Secondary | ICD-10-CM

## 2021-04-21 DIAGNOSIS — I4821 Permanent atrial fibrillation: Secondary | ICD-10-CM

## 2021-04-21 NOTE — Chronic Care Management (AMB) (Signed)
Chronic Care Management Pharmacy Note  04/21/2021 Name:  Brian Davidson MRN:  [id number] DOB:  04/19/1936  Subjective: Brian Davidson is an 85 y.o. year old male who is a primary patient of Mosie Lukes, MD.  The CCM team was consulted for assistance with disease management and care coordination needs.    Engaged with patient by telephone for follow up visit in response to provider referral for pharmacy case management and/or care coordination services.   Consent to Services:  The patient was given information about Chronic Care Management services, agreed to services, and gave verbal consent prior to initiation of services.  Please see initial visit note for detailed documentation.   Patient Care Team: Mosie Lukes, MD as PCP - General (Family Medicine) Thompson Grayer, MD as PCP - Electrophysiology (Cardiology) Jettie Booze, MD as PCP - Cardiology (Cardiology) Thompson Grayer, MD as Consulting Physician (Cardiology) Franchot Mimes, MD as Consulting Physician (Family Medicine) Armbruster, Carlota Raspberry, MD as Consulting Physician (Gastroenterology) Gaynelle Arabian, MD as Consulting Physician (Orthopedic Surgery) Cira Rue, RN as Oncology Nurse Navigator Cherre Robins, PharmD (Pharmacist)  Recent office visits: 03/16/2021 - PCP (Dr Charlett Blake) Video Visit for cough and congestion / f/u COVID. Recommended Mucinex bid for congestion and MVI with minerals daily. 11/24/2020 - PCP (Dr Charlett Blake) F/U chronic conditions. Labs checked. Med changes -Dr Charlett Blake recommended he consider adding daily Krill Oil Capsule and per lab notes to decrease vitamin B12 to 500 mg sublingual tablets take 1 tablet 3 x a week and then repeat labs in 3 months  Recent consult visits: 04/01/2021 - Cardiothoracic surgery (Dr Orvan Seen) f/u ascending aortic aneurysm. Noted to be stable < 5cm in maximal diameter. Recommended repeat imaging in 1 year.  03/05/2021 - completed radiation therapy for prostate  cancer 03/03/2021 - Cardio (RN visit) Pacemaker check 12/30/2020 - Phone Call - Cardio (Dr Irish Lack) patient c/o lightheadedness that is getting worse. Dr Irish Lack recommended hydration and stop irbesartan-HCTZ. Monitor BP. 12/22/2020 - Rad Oncology (Dr Tammi Klippel). Stage T2a adenocarcinoma of the prostate with Gleason score of 4+3, and PSA of 5.98. Discussed the patient's workup and outlined the nature of prostate cancer in this setting.  T stage, Gleason's score, and PSA put him into the unfavorable intermediate risk group. Accordingly, he is eligible for a variety of potential treatment options including brachytherapy, 5.5 weeks of external radiation, or prostatectomy. Cardio conditions noted to make him poor candidate for surgical intervention. At the conclusion of our conversation, the patient is interested in moving forward with 5.5 weeks of external beam therapy without ADT, reserving ADT for future use if the PSA were to continue to rise despite treatment 12/15/2020 - Cardio (Dr Irish Lack) Establish general cardio for non EP issues. Aortic atherosclerosis.  Started rosuvastatin 10 mg daily. 11/25/2020 - ENT (Dr Benjamine Mola) F/U Nosebleed, chronic rhinitis, deviated septum. Unable to see office notes.  Hospital visits: 03/23/2021 - Urgent care with [facility] - impacted cerumen. Procedure performed to remove ear wax.  03/06/2021 - ED Visit for viral pharyngitis / sore throat. COVID positive. Prescribed Molnupiravir 37m bid  Objective:  Lab Results  Component Value Date   CREATININE 1.26 02/26/2021   CREATININE 1.22 11/24/2020   CREATININE 1.31 (H) 05/26/2020    Lab Results  Component Value Date   HGBA1C 5.7 11/24/2020   Last diabetic Eye exam: No results found for: HMDIABEYEEXA  Last diabetic Foot exam: No results found for: HMDIABFOOTEX      Component Value Date/Time  CHOL 101 02/26/2021 1055   TRIG 147.0 02/26/2021 1055   HDL 35.50 (L) 02/26/2021 1055   CHOLHDL 3 02/26/2021 1055    VLDL 29.4 02/26/2021 1055   LDLCALC 36 02/26/2021 1055   LDLCALC 107 (H) 05/26/2020 1103    Hepatic Function Latest Ref Rng & Units 02/26/2021 11/24/2020 05/26/2020  Total Protein 6.0 - 8.3 g/dL 6.3 6.4 7.3  Albumin 3.5 - 5.2 g/dL 3.9 4.0 -  AST 0 - 37 U/L _0 ALT 0 - 53 U/L _1 Alk Phosphatase 39 - 117 U/L 69 66 -  Total Bilirubin 0.2 - 1.2 mg/dL 0.9 0.8 1.0  Bilirubin, Direct 0.0 - 0.3 mg/dL - - -    Lab Results  Component Value Date/Time   TSH 2.01 11/24/2020 10:47 AM   TSH 2.33 05/26/2020 11:03 AM    CBC Latest Ref Rng & Units 03/19/2021 02/26/2021 11/24/2020  WBC 4.0 - 10.5 K/uL 7.1 5.9 10.4  Hemoglobin 13.0 - 17.0 g/dL 13.5 13.4 14.5  Hematocrit 39.0 - 52.0 % 39.7 40.2 43.2  Platelets 150.0 - 400.0 K/uL 165.0 129.0(L) 169.0    No results found for: VD25OH  Clinical ASCVD: Yes  The ASCVD Risk score Mikey Bussing DC Jr., et al., 2013) failed to calculate for the following reasons:   The 2013 ASCVD risk score is only valid for ages 68 to 25     Social History   Tobacco Use  Smoking Status Former   Types: Pipe, Landscape architect   Quit date: 11/18/1985   Years since quitting: 35.4  Smokeless Tobacco Never   BP Readings from Last 3 Encounters:  04/01/21 140/70  03/06/21 (!) 149/88  12/22/20 138/79   Pulse Readings from Last 3 Encounters:  04/01/21 67  03/06/21 67  12/22/20 74   Wt Readings from Last 3 Encounters:  04/01/21 222 lb (100.7 kg)  03/06/21 221 lb (100.2 kg)  12/22/20 227 lb (103 kg)    Assessment: Review of patient past medical history, allergies, medications, health status, including review of consultants reports, laboratory and other test data, was performed as part of comprehensive evaluation and provision of chronic care management services.   SDOH:  (Social Determinants of Health) assessments and interventions performed:  SDOH Interventions    Flowsheet Row Most Recent Value  SDOH Interventions   Financial Strain Interventions Other (Comment)        CCM Care Plan  Allergies  Allergen Reactions   Cardizem [Diltiazem]     Rash from Yellow dye in generic capsule.   Can take different brand. Takes diltiazem - his allergy is to Cardizem   Sulfa Antibiotics     Unknown childhood reaction    Medications Reviewed Today     Reviewed by Cherre Robins, PharmD (Pharmacist) on 04/21/21 at [place] List Status: <None>   Medication Order Taking? Sig Documenting Provider Last Dose Status Informant  Biotin 5000 MCG TABS [id number] Yes Take 5,000 mg by mouth daily. [provider] Taking Active Self  Calcium Carbonate-Vitamin D (CALTRATE 600+D PO) [id number] Yes Take 600 mg by mouth daily.  [provider] Taking Active Self  Cholecalciferol 50 MCG (2000 UT) TABS [id number] Yes Take 2,000 Units by mouth daily.  [provider] Taking Active Self  Cyanocobalamin (VITAMIN B-12) 5000 MCG TBDP [id number] Yes Take by mouth every other day. [provider] Taking Active   diltiazem (CARDIZEM CD) 240 MG 24 hr capsule [id number] Yes TAKE ONE (1) CAPSULE EACH DAY BY MOUTH  Mosie Lukes, MD Taking Active   fluticasone Asencion Islam) 50 MCG/ACT nasal spray [id number] Yes Place 2 sprays into both nostrils daily. Mosie Lukes, MD Taking Active   gabapentin (NEURONTIN) 300 MG capsule [id number] Yes Take 1 capsule by mouth twice daily Mosie Lukes, MD Taking Active   magnesium oxide (MAG-OX) 400 MG tablet [id number] Yes Take 400 mg by mouth daily. [provider] Taking Active Self  metoprolol tartrate (LOPRESSOR) 100 MG tablet [id number] Yes Take 1 tablet (100 mg total) by mouth 2 (two) times daily. Mosie Lukes, MD Taking Active   Multiple Vitamins-Minerals (MULTIVITAMIN WITH MINERALS) tablet [id number] Yes Take 1 tablet by mouth daily. Mosie Lukes, MD Taking Active   rosuvastatin (CRESTOR) 10 MG tablet [id number] Yes Take 1 tablet (10 mg total) by mouth daily. Jettie Booze, MD Taking Active   thiamine  100 MG tablet [id number] Yes Take 100 mg by mouth every other day. [provider] Taking Active Self  Turmeric 500 MG TABS [id number] Yes Take 500 mg by mouth daily.  [provider] Taking Active Self  XARELTO 20 MG TABS tablet [id number] Yes Take 1 tablet by mouth once daily Mosie Lukes, MD Taking Active   Med List Note Starleen Arms 05/27/15 1156):               Patient Active Problem List   Diagnosis Date Noted   [id number] 03/17/2021   Hyperglycemia 11/24/2020   Malignant neoplasm of prostate ([place]) 11/24/2020   Thoracic aortic aneurysm ([place]) 05/27/2020   Primary osteoarthritis of right knee 12/23/2019   Symptomatic bradycardia 05/31/2019   Vertigo 07/27/2018   Multiple closed fractures of ribs of right side 01/21/2018   Thiamine deficiency 01/19/2018   Peripheral neuropathy 07/13/2017   Hyperlipidemia 07/13/2017   Preventative health care 01/11/2017   Carotid artery disease ([place]) 07/01/2016   Mitral valve disorder 07/01/2016   Low back pain 12/31/2015   Osteopenia 12/31/2015   Allergic state 12/31/2015   Skin lesion 12/31/2015   History of chicken pox    Calculi, ureter 03/30/2015   OA (osteoarthritis) of knee 05/10/2014   Pacemaker-[employer] 02/01/2012   Permanent atrial fibrillation ([place])    Hypertension    Heart murmur    Chronic anticoagulation     Immunization History  Administered Date(s) Administered   Influenza, High Dose Seasonal PF 06/18/2017, 06/11/2018, 04/29/2019, 04/28/2020   Influenza-Unspecified 06/11/2016   PFIZER(Purple Top)SARS-COV-2 Vaccination 09/24/2019, 10/10/2019, 05/30/2020   Pneumococcal Conjugate-13 07/20/2015   Pneumococcal Polysaccharide-23 09/05/2016   Tdap 11/14/2014, 05/29/2019   Zoster Recombinat (Shingrix) 12/26/2016, 03/21/2017    Conditions to be addressed/monitored: Atrial Fibrillation, CAD, HTN, HLD and pre DM; prostate cancer (active) neuropathy  Care Plan : General Pharmacy (Adult)   Updates made by Cherre Robins, PHARMD since 04/21/2021 12:00 AM     Problem: Chronic Disease Management support, education, and care coordination needs related to Hypertension, Hyperlipidemia/CAD, AFib, Osteopenia, Neuropathy/Pain   Priority: High  Onset Date: 01/19/2021  Note:   Current Barriers:  Unable to independently afford treatment regimen Unable to achieve control of lipids   Chronic Disease Management support, education, and care coordination needs related to Hypertension, Hyperlipidemia/CAD, AFib, Osteopenia, Neuropathy/Pain  Pharmacist Clinical Goal(s):  Over the next 180 days, patient will verbalize ability to afford treatment regimen achieve control of lipds as evidenced by LDL <70 maintain control of BP as evidenced by BP <130/80  through collaboration with PharmD and provider.   Interventions: 1:1 collaboration with Charlett Blake,  Bonnita Levan, MD regarding development and update of comprehensive plan of care as evidenced by provider attestation and co-signature Inter-disciplinary care team collaboration (see longitudinal plan of care) Comprehensive medication review performed; medication list updated in electronic medical record   Hypertension / Thoracic Aortic Aneurysm BP improving; goal is to maintain BP goal <130/80 Patient stopped irbesartan-HCTZ due to dizziness - per Dr Irish Lack in early 2022; BP has remained at goal until this morning BP yesterday was 121/66 and 136/69 an dHR was in 60's Patient felt lightheaded yesterday afternoon and last night as well as this morning This morning BP was 171/8 and HR 126; recheck and still 174/82 and HR 126.  Suspect patient might be in afib.  Current regimen:  Metoprolol tartrate 130m twice daily Diltiazem 2449mdaily  Interventions: Recommended patient contact cardiologist office ASAP to get recommendations. If he has not heard back from them by noon today, he is to call me back.  Discussed blood pressure goals and tips to getting  accurate blood pressure reading (at last visit)  Continue to check blood pressure 2 to 3 times per week and record Ensure daily salt intake < 2300 mg/day  Hyperlipidemia Lab Results  Component Value Date/Time   LDLCALC 100 (H) 11/24/2020 10:47 AM   LDLCALC 107 (H) 05/26/2020 11:03 AM  Not at goal; LDL goal < 70 Current regimen:  Rosuvastatin 1078maily (started April 2022)  Interventions: Discussed LDL goal - due to recheck lipids at next OV Continue to take rosuvastatin daily Recommend at least 150 minutes of exercise per week - restart activity after radiation therapy complete.   Osteopenia Current regimen:  Calcium 600m55mily Vitamin D 2000 units daily  Interventions: (addressed at previous visit) Consider repeat DEXA Scan (after completion of radiation therapy) Recommended maintain osteopenia medication regimen   Neuropathy Per patient neuropathic is controlled with current therapy Last B12 was 460 (07/13/2017) Last B1 was 87 (11/24/2020) Current regimen:  Gabapentin 300mg68mce a day Vitamin B12 500mcg21mry other day (dose lowered per Dr Blyth Charlett Blake2022) Vitamin B1 100mg d13m  Biotin 1 tablet daily      Atrial Fibrillation:  HR elevated today; patient is taking stroke prevention therapy daily CHADS2VASc score: 4 Current regimen:  Diltiazem 240mg da83mfor heart rate  Xarelto 20mg dai72mo prevent stroke  Interventions: PAP forms have been completed. Requested records for patient and his wife for 2022 out of pocket spend for medications from [facility]. [place] have met requirement of 4% of income OOP will send application. [facility] if faxing records to our office.  Reviewed last SCr and eGFR - last eGFR was 52.77 (6/24/022). Dose of Xarelto currently appropriate. If eGFR drops to < 50 should consider lower Xarelto dose of 15mg dail80m Continue current medications for atrial fibrillation  Pre-Diabetes: Controlled Current treatment: Diet and exercise  Does not  check BG at home Usually exercises 5 to 7 days per week. Will take off during radiation therapy that will start 01/26/21.  Interventions:  Continue to limiting sugar and carbohydrate intake Continue as planned to restart exercise after upcoming radiation therapy.   Medication management Pharmacist Clinical Goal(s): Over the next 90 days, patient will work with PharmD and providers to maintain optimal medication adherence [facility] Comprehensive medication review performed. Continue current medication management strategy  Patient Goals/Self-Care Activities Over the next 180 days, patient will:  take medications as prescribed, check blood pressure 2 to 3 times  per week, document, and provide at future appointments, and collaborate with provider on medication access solutions  Follow Up Plan: Telephone follow up appointment with care management team member scheduled for:    2 to 3 months     Medication Assistance: Requested OOP for 2022 from [place]. Might also have to include information from [facility] (patient only gets diltiazem from them) if has not met 4% OOP from [facility].   Patient's preferred pharmacy is:  [facility]) [place], [place] [address] [place] [address] Phone: [phone] Fax: [phone]  [facility] [address] [place], [place] - [address] [place] [address] Phone: [phone] Fax: [phone]  [place], [place] - [address] [place] [address] Phone: [phone] Fax: [phone]   Follow Up:  Patient agrees to Care Plan and Follow-up.  Plan: Telephone follow up appointment with care management team member scheduled for:  2 to 3 months  Cherre Robins, PharmD Clinical Pharmacist [place] [facility]

## 2021-04-21 NOTE — Telephone Encounter (Signed)
Spoke with the patient who states that he has been having some lightheadedness when he gets up and moves. He states that yesterday he was walking from his car into the store and got lightheaded and had to stop and hold onto the wall and rest. Symptoms resolved, however when he was in the store and he went to bed over to get groceries he would get lightheaded again. He states that this also occurred when he was outside mowing his lawn. Yesterday his BP was 121/66 with HR 69. Patient states that he did not ever feel like he was going to pass out. He states that he does feel fatigued often. Today he states that he is not lightheaded or dizzy but his head feels a little funny. This morning his BP was 171/81. This was prior to taking any medications. Patient is currently taking Lopressor 100 mg BID and diltiazem '240mg'$  daily. It was advised back in April 2022 that he try taking his diltiazem at night time instead of in the AM. Patient did not switch timing of his diltiazem and has continued to take it in the AM. Advised patient that he could try slowly adjusting timing of his diltizem to night time. Also advised on changing positions slowly and staying hydrated especially when he is outside working.

## 2021-04-21 NOTE — Telephone Encounter (Signed)
STAT if patient feels like he/she is going to faint   Are you dizzy now? Yes a little   Do you feel faint or have you passed out? No   Do you have any other symptoms? Lightheaded.   Have you checked your HR and BP (record if available)? BP 186/83 HR 92

## 2021-04-21 NOTE — Telephone Encounter (Signed)
I agree with changing time of diltizem.  Since BP is high prior to meds, I am hesitant to decrease the dose of any meds right now.

## 2021-04-21 NOTE — Patient Instructions (Signed)
Visit Information  PATIENT GOALS:  Goals Addressed             This Visit's Progress    Chronic Care Management Pharmacy Care Plan       CARE PLAN ENTRY (see longitudinal plan of care for additional care plan information)  Current Barriers:  Chronic Disease Management support, education, and care coordination needs related to Hypertension, Hyperlipidemia/CAD, AFib, Osteopenia, Neuropathy/Pain   Hypertension / Aneurysm BP Readings from Last 3 Encounters:  04/01/21 140/70  03/06/21 (!) 149/88  12/22/20 138/79  Pharmacist Clinical Goal(s): Over the next 90 days, patient will work with PharmD and providers to maintain BP goal <130/80 Current regimen:  Metoprolol tartrate '100mg'$  twice daily Diltiazem '240mg'$  daily  Interventions: Discussed blood pressure goals and tips to getting accurate blood pressure reading Requested patient to check blood pressure 2 to 3 times per week and record Patient self care activities - Over the next 90 days, patient will: Contact cardiologist ASAP regarding increased HR and BP. Check blood pressure 2 to 3 times per week, document, and provide at future appointments Ensure daily salt intake < 2300 mg/day  Hyperlipidemia Lab Results  Component Value Date/Time   LDLCALC 36 02/26/2021 10:55 AM   LDLCALC 107 (H) 05/26/2020 11:03 AM  Pharmacist Clinical Goal(s): Over the next 90 days, patient will work with PharmD and providers to achieve LDL goal < 70 Current regimen:  Rosuvastatin '10mg'$  daily (started April 2022)  Interventions: Discussed LDL goal Patient self care activities - Over the next 90 days, patient will: Continue to take rosuvastatin daily Goal is to get 150 minutes of exercise per week - restart activity after radiation therapy complete.  Check lipid panel with next labs.  Osteopenia Pharmacist Clinical Goal(s) Over the next 90 days, patient will work with PharmD and providers to reduce risk of fracture due to osteopenia Current  regimen:  Calcium '600mg'$  daily Vitamin D 2000 units daily  Interventions: Consider repeat DEXA Scan Patient self care activities - Over the next 90 days, patient will: Maintain osteopenia medication regimen    Neuropathy Pharmacist Clinical Goal(s) Over the next 90 days, patient will work with PharmD and providers to reduce risk symptoms of neuropathy and ensure adequate supplementation of vitamin deficiencies associated with neuropathy.  Current regimen:  Gabapentin '300mg'$  twice a day Vitamin B12 541mg every other day Vitamin B1 '100mg'$  daily  Biotin 1 tablet daily   Patient self care activities - Over the next 90 days, patient will: Decrease thiamine / B1 dose to '100mg'$  every OTHER day.  Come for labs as planned in June 2022.   Atrial Fibrillation:  Pharmacist Clinical Goal(s) Over the next 90 days, patient will work with PharmD and providers to identify potential programs to assist with Xarelto cost during coverage gap Current regimen:  Diltiazem '240mg'$  daily for heart rate  Xarelto '20mg'$  daily to prevent stroke  Interventions: Assisted patient in completing PAP application for Xarleto Patient self care activities - Over the next 90 days, patient will: [place] Blood[place] of pocket expense for all meds for 2022 and bring to office. If >4% of income, will send application for Xarelto assistance Continue current medications for atrial fibrillation  Medication management Pharmacist Clinical Goal(s): Over the next 90 days, patient will work with PharmD and providers to maintain optimal medication adherence Current pharmacy: [facility] Interventions Comprehensive medication review performed. Continue current medication management strategy Patient self care activities - Over the next 90 days, patient will: Focus on medication adherence by filling and  taking medications appropriately  Take medications as prescribed Report any questions or concerns to PharmD and/or provider(s)  Please see past  updates related to this goal by clicking on the "Past Updates" button in the selected goal          The patient verbalized understanding of instructions, educational materials, and care plan provided today and declined offer to receive copy of patient instructions, educational materials, and care plan.   Telephone follow up appointment with care management team member scheduled for: 2 to 3 months  Cherre Robins, PharmD Clinical Pharmacist [facility]

## 2021-04-23 ENCOUNTER — Encounter: Payer: Self-pay | Admitting: Urology

## 2021-04-23 NOTE — Progress Notes (Addendum)
Patient reports no pain, dysuria, hematuria, diarrhea, constipation or skin changes. Patient states mild weakness with his urinary stream, urinary frequency/ urgency, with nocturia x2, but has good energy levels.  I-PSS score of 10. Meaningful use questions complete and patient notified of 9:30am telephone appointment on 04/28/21 and expressed understanding.

## 2021-04-27 NOTE — Progress Notes (Signed)
  Radiation Oncology         [phone] ________________________________  Name: Brian Davidson MRN: [id number]  Date: 03/05/2021  DOB: November 12, 1935  End of Treatment Note  Diagnosis:   85 y.o. gentleman with Stage T2a adenocarcinoma of the prostate with Gleason score of 4+3, and PSA of 5.98.     Indication for treatment:  Curative, Definitive Radiotherapy       Radiation treatment dates:   01/26/21 - 03/05/21  Site/dose:   The prostate was treated to 70 Gy in 28 fractions of 2.5 Gy  Beams/energy:   The patient was treated with IMRT using volumetric arc therapy delivering 6 MV X-rays to clockwise and counterclockwise circumferential arcs with a 90 degree collimator offset to avoid dose scalloping.  Image guidance was performed with daily cone beam CT prior to each fraction to align to gold markers in the prostate and assure proper bladder and rectal fill volumes.  Immobilization was achieved with BodyFix custom mold.  Narrative: The patient tolerated radiation treatment relatively well with only minor urinary irritation and mild fatigue.  He reported mild increased frequency and hesitancy but otherwise, had no complaints.  Plan: The patient has completed radiation treatment. He will return to radiation oncology clinic for routine followup in one month. I advised him to call or return sooner if he has any questions or concerns related to his recovery or treatment. ________________________________  Sheral Apley. Tammi Klippel, M.D.

## 2021-04-28 ENCOUNTER — Ambulatory Visit
Admission: RE | Admit: 2021-04-28 | Discharge: 2021-04-28 | Disposition: A | Discharge status: Home / Self Care | Payer: PPO | Source: Ambulatory Visit | Attending: Urology | Admitting: Urology

## 2021-04-28 ENCOUNTER — Other Ambulatory Visit: Payer: Self-pay | Admitting: Urology

## 2021-04-28 DIAGNOSIS — C61 Malignant neoplasm of prostate: Secondary | ICD-10-CM

## 2021-04-28 NOTE — Progress Notes (Signed)
Radiation Oncology         [phone] ________________________________  Name: Brian Davidson MRN: [id number]  Date: 04/28/2021  DOB: 03-Jan-1936  Post Treatment Note  CC: Mosie Lukes, MD  Festus Aloe, MD  Diagnosis:   85 y.o. gentleman with Stage T2a adenocarcinoma of the prostate with Gleason score of 4+3, and PSA of 5.98.    Interval Since Last Radiation:  8 weeks  01/26/21 - 03/05/21:  The prostate was treated to 70 Gy in 28 fractions of 2.5 Gy  Narrative:  I spoke with the patient to conduct his routine scheduled 1 month follow up visit via telephone to spare the patient unnecessary potential exposure in the healthcare setting during the current COVID-19 pandemic.  The patient was notified in advance and gave permission to proceed with this visit format.  He tolerated radiation treatment relatively well with only minor urinary irritation and mild fatigue.  He reported mild increased frequency and hesitancy but otherwise, had no complaints.                              On review of systems, the patient states that he is doing very well in general.  His current IPSS score is 10, indicating moderate urinary symptoms with a mild weakened flow of stream, frequency/urgency and nocturia x2.  He specifically denies dysuria, gross hematuria, straining to void, incomplete bladder emptying or incontinence.  He reports that his LUTS are gradually improving and he feels that he is pretty close back to his baseline at this point.  He is also noted some gradual improvement in his energy level and has been able to remain active which he is pleased with.  He denies abdominal pain, nausea, vomiting, diarrhea or constipation.  He reports a healthy appetite and is maintaining his weight.  He had a recent follow-up visit with Dr. Junious Silk on 03/29/2021 with a PSA drawn prior to that visit and noted decreased to 2.0 from 5.98 in October 2021.  Overall, he is quite pleased with his progress to  date.  ALLERGIES:  is allergic to cardizem [diltiazem] and sulfa antibiotics.  Meds: Current Outpatient Medications  Medication Sig Dispense Refill   Biotin 5000 MCG TABS Take 5,000 mg by mouth daily.     Calcium Carbonate-Vitamin D (CALTRATE 600+D PO) Take 600 mg by mouth daily.      Cholecalciferol 50 MCG (2000 UT) TABS Take 2,000 Units by mouth daily.      Cyanocobalamin (VITAMIN B-12) 5000 MCG TBDP Take by mouth every other day.     diltiazem (CARDIZEM CD) 240 MG 24 hr capsule TAKE ONE (1) CAPSULE EACH DAY BY MOUTH 90 capsule 1   fluticasone (FLONASE) 50 MCG/ACT nasal spray Place 2 sprays into both nostrils daily. 16 g 1   gabapentin (NEURONTIN) 300 MG capsule Take 1 capsule by mouth twice daily 180 capsule 1   magnesium oxide (MAG-OX) 400 MG tablet Take 400 mg by mouth daily.     metoprolol tartrate (LOPRESSOR) 100 MG tablet Take 1 tablet (100 mg total) by mouth 2 (two) times daily. 180 tablet 1   Multiple Vitamins-Minerals (MULTIVITAMIN WITH MINERALS) tablet Take 1 tablet by mouth daily. 30 tablet 3   rosuvastatin (CRESTOR) 10 MG tablet Take 1 tablet (10 mg total) by mouth daily. 90 tablet 3   thiamine 100 MG tablet Take 100 mg by mouth every other day.     Turmeric 500 MG  TABS Take 500 mg by mouth daily.      XARELTO 20 MG TABS tablet Take 1 tablet by mouth once daily 90 tablet 1   No current facility-administered medications for this encounter.    Physical Findings:  vitals were not taken for this visit.  Pain Assessment Pain Score: 0-No pain/10 Unable to assess due to telephone follow-up visit format.  Lab Findings: Lab Results  Component Value Date   WBC 7.1 03/19/2021   HGB 13.5 03/19/2021   HCT 39.7 03/19/2021   MCV 87.7 03/19/2021   PLT 165.0 03/19/2021     Radiographic Findings: CT ANGIO CHEST AORTA W/CM & OR WO/CM  Result Date: 03/30/2021 CLINICAL DATA:  Follow-up of aneurysmal disease of the ascending thoracic aorta. EXAM: CT ANGIOGRAPHY CHEST WITH  CONTRAST TECHNIQUE: Multidetector CT imaging of the chest was performed using the standard protocol during bolus administration of intravenous contrast. Multiplanar CT image reconstructions and MIPs were obtained to evaluate the vascular anatomy. CONTRAST:  24m ISOVUE-370 IOPAMIDOL (ISOVUE-370) INJECTION 76% COMPARISON:  CT of the chest without contrast on 11/23/2020, CTA of the chest on 05/12/2020. FINDINGS: Cardiovascular: Stable dimensions of the thoracic aorta. The aortic root measures approximately 4.3-4.5 cm at the level of the sinuses of Valsalva. The ascending thoracic aorta measures 4.3 cm in greatest diameter. The proximal arch measures 2.9 cm and the distal arch 3.0 cm. The descending thoracic aorta measures 2.8 cm. Significant atherosclerosis again noted especially at the level of the arch and descending thoracic aorta. Severity of plaque shows some progression since 2021. No overt ulcerated plaque, evidence of penetrating ulcer disease or dissection. Visualized proximal great vessels demonstrate no significant obstructive disease or aneurysmal disease. The left vertebral artery is not confidently visualized and may be chronically occluded. The right vertebral artery is patent. Stable heart size and appearance of a single chamber pacemaker extending to the right ventricular apex. No pericardial fluid. Stable calcified coronary artery plaque. Central pulmonary arteries show stable dilatation with the main pulmonary artery measuring up to 4.2 cm. Mediastinum/Nodes: Stable small mediastinal lymph nodes. No enlarged mediastinal, hilar, or axillary lymph nodes. Thyroid gland, trachea, and esophagus demonstrate no significant findings. Lungs/Pleura: Stable small/trace bilateral pleural effusions, left greater than right. There is no evidence of pulmonary edema, consolidation, pneumothorax, nodule or pleural fluid. Upper Abdomen: No acute abnormality. Musculoskeletal: No chest wall abnormality. No acute or  significant osseous findings. Review of the MIP images confirms the above findings. IMPRESSION: 1. Stable dilatation of the thoracic aorta with dilated aortic root measuring up to 4.3-4.5 cm and dilated ascending thoracic aorta measuring up to 4.3 cm. Atherosclerosis of the aortic arch and descending thoracic aorta shows some progression since 2021. Recommend annual imaging followup by CTA or MRA. This recommendation follows 2010 ACCF/AHA/AATS/ACR/ASA/SCA/SCAI/SIR/STS/SVM Guidelines for the Diagnosis and Management of Patients with Thoracic Aortic Disease. Circulation. 2010; 121:[id number] Aortic aneurysm NOS (ICD10-I71.9) 2. 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 3. Stable dilated central pulmonary arteries consistent with component of underlying pulmonary hypertension. 4. Stable small bilateral pleural effusions, left greater than right. Aortic aneurysm NOS (ICD10-I71.9). Electronically Signed   By: GAletta EdouardM.D.   On: 03/30/2021 09:54    Impression/Plan: 1. 84y.o. gentleman with Stage T2a adenocarcinoma of the prostate with Gleason score of 4+3, and PSA of 5.98.   He will continue to follow up with urology for ongoing PSA determinations and has an appointment scheduled for labs on 08/02/2021 and will follow-up with Dr.  Eskridge thereafter. He understands what to expect with regards to PSA monitoring going forward. I will look forward to following his response to treatment via correspondence with urology, and would be happy to continue to participate in his care if clinically indicated. I talked to the patient about what to expect in the future, including his risk for erectile dysfunction and rectal bleeding. I encouraged him to call or return to the office if he has any questions regarding his previous radiation or possible radiation side effects. He was comfortable with this plan and will follow up as needed.     Nicholos Johns, PA-C

## 2021-05-04 DIAGNOSIS — L821 Other seborrheic keratosis: Secondary | ICD-10-CM | POA: Diagnosis not present

## 2021-05-04 DIAGNOSIS — L570 Actinic keratosis: Secondary | ICD-10-CM | POA: Diagnosis not present

## 2021-05-04 DIAGNOSIS — X32XXXS Exposure to sunlight, sequela: Secondary | ICD-10-CM | POA: Diagnosis not present

## 2021-05-04 DIAGNOSIS — I781 Nevus, non-neoplastic: Secondary | ICD-10-CM | POA: Diagnosis not present

## 2021-05-04 DIAGNOSIS — L814 Other melanin hyperpigmentation: Secondary | ICD-10-CM | POA: Diagnosis not present

## 2021-05-12 ENCOUNTER — Telehealth: Payer: Self-pay

## 2021-05-12 NOTE — Progress Notes (Addendum)
    Chronic Care Management Pharmacy Assistant   Name: SAYAN GIESER  MRN: [id number] DOB: [birth year]  05/12/21 Called [facility] pharmacy to get patient's Out of pocket expense report for Xarelto 20 mg to be faxed to [employer]. The pharmacist from [place] stated that she was unsure of how to send information over via fax but she will try and if not she will leave a message for the pharmacist for tomorrow. Pharmacist has my name and call back number available if any issues should arise.  05/13/21 Called [facility] pharmacy to get an update on whether the out of pocket expense has been faxed to [employer]. Tasha from the pharmacy spoke with the pharmacist and informed me that due to company policy, they cannot release information to a provider. They stated that the patient would have to come in and request the out of pocket expense report in person. Please advise.

## 2021-05-13 ENCOUNTER — Telehealth: Payer: Self-pay

## 2021-05-13 NOTE — Progress Notes (Signed)
    Chronic Care Management Pharmacy Assistant   Name: Brian Davidson  MRN: [id number] DOB: 23-Mar-1936  Opened in error

## 2021-05-13 NOTE — Progress Notes (Signed)
Opened in error

## 2021-05-26 DIAGNOSIS — B351 Tinea unguium: Secondary | ICD-10-CM | POA: Diagnosis not present

## 2021-05-26 DIAGNOSIS — L84 Corns and callosities: Secondary | ICD-10-CM | POA: Diagnosis not present

## 2021-05-26 DIAGNOSIS — M79671 Pain in right foot: Secondary | ICD-10-CM | POA: Diagnosis not present

## 2021-05-26 DIAGNOSIS — L89892 Pressure ulcer of other site, stage 2: Secondary | ICD-10-CM | POA: Diagnosis not present

## 2021-05-26 DIAGNOSIS — M79672 Pain in left foot: Secondary | ICD-10-CM | POA: Diagnosis not present

## 2021-06-02 ENCOUNTER — Ambulatory Visit (INDEPENDENT_AMBULATORY_CARE_PROVIDER_SITE_OTHER): Payer: PPO

## 2021-06-02 DIAGNOSIS — I495 Sick sinus syndrome: Secondary | ICD-10-CM | POA: Diagnosis not present

## 2021-06-03 LAB — CUP PACEART REMOTE DEVICE CHECK
Battery Remaining Longevity: 53 mo
Battery Remaining Percentage: 38 %
Battery Voltage: 2.95 V
Brady Statistic RV Percent Paced: 11 %
Date Time Interrogation Session: 20220928021052
Implantable Lead Implant Date: 20130528
Implantable Lead Location: 753860
Implantable Lead Model: 1948
Implantable Pulse Generator Implant Date: 20130528
Lead Channel Impedance Value: 630 Ohm
Lead Channel Pacing Threshold Amplitude: 0.5 V
Lead Channel Pacing Threshold Pulse Width: 0.4 ms
Lead Channel Sensing Intrinsic Amplitude: 12 mV
Lead Channel Setting Pacing Amplitude: 2.5 V
Lead Channel Setting Pacing Pulse Width: 0.4 ms
Lead Channel Setting Sensing Sensitivity: 2 mV
Pulse Gen Model: 1210
Pulse Gen Serial Number: 7328888

## 2021-06-07 ENCOUNTER — Other Ambulatory Visit: Payer: Self-pay | Admitting: Family Medicine

## 2021-06-07 ENCOUNTER — Other Ambulatory Visit: Payer: Self-pay

## 2021-06-08 ENCOUNTER — Encounter: Payer: Self-pay | Admitting: Family Medicine

## 2021-06-08 ENCOUNTER — Ambulatory Visit (INDEPENDENT_AMBULATORY_CARE_PROVIDER_SITE_OTHER): Payer: PPO | Admitting: Family Medicine

## 2021-06-08 VITALS — BP 112/76 | HR 79 | Temp 97.8°F | Resp 16 | Wt 220.2 lb

## 2021-06-08 DIAGNOSIS — R739 Hyperglycemia, unspecified: Secondary | ICD-10-CM | POA: Diagnosis not present

## 2021-06-08 DIAGNOSIS — E519 Thiamine deficiency, unspecified: Secondary | ICD-10-CM

## 2021-06-08 DIAGNOSIS — C61 Malignant neoplasm of prostate: Secondary | ICD-10-CM

## 2021-06-08 DIAGNOSIS — Z0000 Encounter for general adult medical examination without abnormal findings: Secondary | ICD-10-CM

## 2021-06-08 DIAGNOSIS — G6289 Other specified polyneuropathies: Secondary | ICD-10-CM | POA: Diagnosis not present

## 2021-06-08 DIAGNOSIS — U071 COVID-19: Secondary | ICD-10-CM | POA: Diagnosis not present

## 2021-06-08 DIAGNOSIS — I10 Essential (primary) hypertension: Secondary | ICD-10-CM | POA: Diagnosis not present

## 2021-06-08 DIAGNOSIS — E785 Hyperlipidemia, unspecified: Secondary | ICD-10-CM

## 2021-06-08 DIAGNOSIS — M8580 Other specified disorders of bone density and structure, unspecified site: Secondary | ICD-10-CM | POA: Diagnosis not present

## 2021-06-08 LAB — COMPREHENSIVE METABOLIC PANEL
ALT: 16 U/L (ref 0–53)
AST: 22 U/L (ref 0–37)
Albumin: 4 g/dL (ref 3.5–5.2)
Alkaline Phosphatase: 80 U/L (ref 39–117)
BUN: 19 mg/dL (ref 6–23)
CO2: 28 mEq/L (ref 19–32)
Calcium: 9 mg/dL (ref 8.4–10.5)
Chloride: 104 mEq/L (ref 96–112)
Creatinine, Ser: 1.2 mg/dL (ref 0.40–1.50)
GFR: 55.84 mL/min — ABNORMAL LOW (ref >60.00)
Glucose, Bld: 88 mg/dL (ref 70–99)
Potassium: 4 mEq/L (ref 3.5–5.1)
Sodium: 140 mEq/L (ref 135–145)
Total Bilirubin: 1 mg/dL (ref 0.2–1.2)
Total Protein: 6.6 g/dL (ref 6.0–8.3)

## 2021-06-08 LAB — CBC WITH DIFFERENTIAL/PLATELET
Basophils Absolute: 0 10*3/uL (ref 0.0–0.1)
Basophils Relative: 0.8 % (ref 0.0–3.0)
Eosinophils Absolute: 0.2 10*3/uL (ref 0.0–0.7)
Eosinophils Relative: 3.2 % (ref 0.0–5.0)
HCT: 41.7 % (ref 39.0–52.0)
Hemoglobin: 13.6 g/dL (ref 13.0–17.0)
Lymphocytes Relative: 14.6 % (ref 12.0–46.0)
Lymphs Abs: 0.8 10*3/uL (ref 0.7–4.0)
MCHC: 32.5 g/dL (ref 30.0–36.0)
MCV: 89.1 fl (ref 78.0–100.0)
Monocytes Absolute: 0.9 10*3/uL (ref 0.1–1.0)
Monocytes Relative: 15 % — ABNORMAL HIGH (ref 3.0–12.0)
Neutro Abs: 3.8 10*3/uL (ref 1.4–7.7)
Neutrophils Relative %: 66.4 % (ref 43.0–77.0)
Platelets: 135 10*3/uL — ABNORMAL LOW (ref 150.0–400.0)
RBC: 4.68 Mil/uL (ref 4.22–5.81)
RDW: 15.5 % (ref 11.5–15.5)
WBC: 5.8 10*3/uL (ref 4.0–10.5)

## 2021-06-08 LAB — TSH: TSH: 2.38 u[IU]/mL (ref 0.35–5.50)

## 2021-06-08 LAB — HEMOGLOBIN A1C: Hgb A1c MFr Bld: 5.8 % (ref 4.6–6.5)

## 2021-06-08 LAB — LIPID PANEL
Cholesterol: 102 mg/dL (ref 0–200)
HDL: 40.3 mg/dL (ref >39.00)
LDL Cholesterol: 44 mg/dL (ref 0–99)
NonHDL: 61.27
Total CHOL/HDL Ratio: 3
Triglycerides: 85 mg/dL (ref 0.0–149.0)
VLDL: 17 mg/dL (ref 0.0–40.0)

## 2021-06-08 LAB — VITAMIN D 25 HYDROXY (VIT D DEFICIENCY, FRACTURES): VITD: 69.87 ng/mL (ref 30.00–100.00)

## 2021-06-08 LAB — VITAMIN B12: Vitamin B-12: 1048 pg/mL — ABNORMAL HIGH (ref 211–911)

## 2021-06-08 NOTE — Assessment & Plan Note (Signed)
Supplement and monitor 

## 2021-06-08 NOTE — Progress Notes (Signed)
Patient ID: Brian Davidson, male    DOB: 1936-08-29  Age: 85 y.o. MRN: [id number]    Subjective:   Chief Complaint  Patient presents with   Annual Exam    Phlegm    Subjective  HPI Brian Davidson presents for office visit today for comprehensive physical exam today and follow up on management of chronic concerns. He noted a feeling of a lump in his throat that he mainly notices at night. He denies congestion, sore throat, and endorses hydrating well. It has developed after he got covid in July 1st 2022. Denies CP/palp/SOB/HA/congestion/fevers/GI or GU c/o. Taking meds as prescribed.  His PSA went down from 5.9 to 2 since he started radiation treatment for prostate cancer. He sees Dr. Tammi Klippel every 6 months.  He takes B12 5000 mcg twice a week, takes 1000IU of vitamin D every day, and takes thiamine 100 mg 4 times a week.   Right foot was giving him trouble was sore but now it is better and he is back to doing physical exercise. Still experiences numbness and soreness with occasional burning sensation in LE's that is better in am and worsens at pm. He broke his left ankle in the past as well.   Review of Systems  Constitutional:  Negative for chills, fatigue and fever.  HENT:  Negative for congestion, rhinorrhea, sinus pressure, sinus pain, sore throat and trouble swallowing.   Eyes:  Negative for pain.  Respiratory:  Negative for cough and shortness of breath.   Cardiovascular:  Negative for chest pain, palpitations and leg swelling.  Gastrointestinal:  Negative for abdominal pain, blood in stool, diarrhea, nausea and vomiting.  Genitourinary:  Negative for flank pain, frequency and penile pain.  Musculoskeletal:  Negative for back pain.  Neurological:  Negative for headaches.   History Past Medical History:  Diagnosis Date   Arthritis    Biliary dyskinesia    Carotid artery disease ([place]) 07/01/2016   Dyslipidemia 07/13/2017   Dysrhythmia    a fib   GERD (gastroesophageal  reflux disease)    occasional   H/O measles    History of chicken pox    History of kidney stones    Hypertension    Neuromuscular disorder ([facility])    neuropathy feet    Persistent atrial fibrillation ([facility])    Pneumonia 1992   Presence of permanent cardiac pacemaker    Prostate cancer ([place])    Tachycardia-bradycardia ([place])    a. s/p STJ dual chamber pacemaker   Thiamine deficiency 01/19/2018   Valvular heart disease 07/01/2016    He has a past surgical history that includes permanent pacemaker insertion (N/A, 01/31/2012); Cystoscopy (2016); Cholecystectomy (N/A, 11/24/2015); Fracture surgery (2005); Hernia repair; Eye surgery; and Total knee arthroplasty (Right, 12/23/2019).   His family history includes Arthritis in his mother; Drug abuse in his mother; Heart disease in his brother, father, and sister; Heart disease (age of onset: 55) in his sister; Hypertension in his daughter and mother; Neuropathy in his brother and mother; Prostate cancer in his father; Stroke in his father.He reports that he quit smoking about 35 years ago. His smoking use included pipe and cigars. He has never used smokeless tobacco. He reports current alcohol use of about 3.0 standard drinks per week. He reports that he does not use drugs.  Current Outpatient Medications on File Prior to Visit  Medication Sig Dispense Refill   Biotin 5000 MCG TABS Take 5,000 mg by mouth daily.     Calcium Carbonate-Vitamin  D (CALTRATE 600+D PO) Take 600 mg by mouth daily.      Cholecalciferol 50 MCG (2000 UT) TABS Take 2,000 Units by mouth daily.      Cyanocobalamin (VITAMIN B-12) 5000 MCG TBDP Take by mouth every other day.     diltiazem (CARDIZEM CD) 240 MG 24 hr capsule TAKE ONE (1) CAPSULE EACH DAY BY MOUTH 90 capsule 1   fluticasone (FLONASE) 50 MCG/ACT nasal spray Place 2 sprays into both nostrils daily. 16 g 1   gabapentin (NEURONTIN) 300 MG capsule Take 1 capsule by mouth twice daily 180 capsule 1   magnesium oxide (MAG-OX)  400 MG tablet Take 400 mg by mouth daily.     metoprolol tartrate (LOPRESSOR) 100 MG tablet Take 1 tablet by mouth twice daily 180 tablet 0   Multiple Vitamins-Minerals (MULTIVITAMIN WITH MINERALS) tablet Take 1 tablet by mouth daily. 30 tablet 3   rosuvastatin (CRESTOR) 10 MG tablet Take 1 tablet (10 mg total) by mouth daily. 90 tablet 3   thiamine 100 MG tablet Take 100 mg by mouth every other day.     Turmeric 500 MG TABS Take 500 mg by mouth daily.      XARELTO 20 MG TABS tablet Take 1 tablet by mouth once daily 90 tablet 1   No current facility-administered medications on file prior to visit.     Objective:  Objective  Physical Exam Constitutional:      General: He is not in acute distress.    Appearance: Normal appearance. He is not ill-appearing or toxic-appearing.  HENT:     Head: Normocephalic and atraumatic.     Right Ear: Tympanic membrane, ear canal and external ear normal.     Left Ear: Tympanic membrane, ear canal and external ear normal.     Nose: No congestion or rhinorrhea.     Mouth/Throat:     Mouth: Mucous membranes are moist.     Pharynx: Oropharynx is clear. No pharyngeal swelling, oropharyngeal exudate or uvula swelling.  Eyes:     Extraocular Movements: Extraocular movements intact.     Pupils: Pupils are equal, round, and reactive to light.  Cardiovascular:     Rate and Rhythm: Normal rate. Rhythm irregular.     Pulses: Normal pulses.     Heart sounds: Normal heart sounds. No murmur heard. Pulmonary:     Effort: Pulmonary effort is normal. No respiratory distress.     Breath sounds: Normal breath sounds. No wheezing, rhonchi or rales.  Abdominal:     General: Bowel sounds are normal.     Palpations: Abdomen is soft. There is no mass.     Tenderness: There is no abdominal tenderness. There is no guarding.     Hernia: No hernia is present.  Musculoskeletal:        General: Normal range of motion.     Cervical back: Normal range of motion and neck  supple.     Right lower leg: Edema present.     Left lower leg: Edema present.  Skin:    General: Skin is warm and dry.  Neurological:     Mental Status: He is alert and oriented to person, place, and time.     Cranial Nerves: No facial asymmetry.     Motor: Motor function is intact. No weakness.     Deep Tendon Reflexes:     Reflex Scores:      Patellar reflexes are 1+ on the right side and 1+ on the left side.  Psychiatric:        Behavior: Behavior normal.   BP 112/76   Pulse 79   Temp 97.8 F (36.6 C)   Resp 16   Wt 220 lb 3.2 oz (99.9 kg)   SpO2 98%   BMI 29.86 kg/m  Wt Readings from Last 3 Encounters:  06/08/21 220 lb 3.2 oz (99.9 kg)  04/01/21 222 lb (100.7 kg)  03/06/21 221 lb (100.2 kg)     Lab Results  Component Value Date   WBC 7.1 03/19/2021   HGB 13.5 03/19/2021   HCT 39.7 03/19/2021   PLT 165.0 03/19/2021   GLUCOSE 102 (H) 02/26/2021   CHOL 101 02/26/2021   TRIG 147.0 02/26/2021   HDL 35.50 (L) 02/26/2021   LDLCALC 36 02/26/2021   ALT 14 02/26/2021   AST 19 02/26/2021   NA 140 02/26/2021   K 3.9 02/26/2021   CL 104 02/26/2021   CREATININE 1.26 02/26/2021   BUN 19 02/26/2021   CO2 28 02/26/2021   TSH 2.01 11/24/2020   INR 1.5 (H) 12/16/2019   HGBA1C 5.7 11/24/2020    No results found.   Assessment & Plan:  Plan    No orders of the defined types were placed in this encounter.   Problem List Items Addressed This Visit     Hypertension    Well controlled, no changes to meds. Encouraged heart healthy diet such as the DASH diet and exercise as tolerated.       Relevant Orders   CBC with Differential/Platelet   Comprehensive metabolic panel   Lipid panel   TSH   Osteopenia    Encouraged to get adequate exercise, calcium and vitamin d intake      Relevant Orders   VITAMIN D 25 Hydroxy (Vit-D Deficiency, Fractures)   Preventative health care    Patient encouraged to maintain heart healthy diet, regular exercise, adequate sleep.  Consider daily probiotics. Take medications as prescribed. Labs ordered and reviewed. Aged out of screening colonoscopy      Relevant Orders   Hemoglobin A1c   CBC with Differential/Platelet   Comprehensive metabolic panel   Lipid panel   TSH   Vitamin B12   Peripheral neuropathy    He is using a cream from [facility] for the pain, and controls his risk factors. Notes it is best in am and worsens throught the day resulting some numbness at base of feet and occasional burning      Hyperlipidemia    Tolerating statin, encouraged heart healthy diet, avoid trans fats, minimize simple carbs and saturated fats. Increase exercise as tolerated      Relevant Orders   CBC with Differential/Platelet   Comprehensive metabolic panel   Lipid panel   TSH   Thiamine deficiency    Supplement and monitor      Relevant Orders   Vitamin B1   Hyperglycemia - Primary    hgba1c acceptable, minimize simple carbs. Increase exercise as tolerated.       Relevant Orders   Hemoglobin A1c   Malignant neoplasm of prostate [facility])    Has completed his radiation and is still following with urology and oncology has repeat labs soon      COVID-19    He had COVID on July 1 and feels largely recovered but he still has a sense of Globus. He is encouraged to use Mucinex prn and increase his water intake       Follow-up: Return in about 6 months (around 12/07/2021).  I, Suezanne Jacquet, acting as a scribe for [name], MD, have documented all relevent documentation on behalf of [name], MD, as directed by [name], MD while in the presence of [name], MD. DO:06/08/21.  I, Mosie Lukes, MD personally performed the services described in this documentation. All medical record entries made by the scribe were at my direction and in my presence. I have reviewed the chart and agree that the record reflects my personal performance and is accurate and complete

## 2021-06-08 NOTE — Assessment & Plan Note (Addendum)
He is using a cream from [facility] for the pain, and controls his risk factors. Notes it is best in am and worsens throught the day resulting some numbness at base of feet and occasional burning

## 2021-06-08 NOTE — Assessment & Plan Note (Signed)
He had COVID on July 1 and feels largely recovered but he still has a sense of Globus. He is encouraged to use Mucinex prn and increase his water intake

## 2021-06-08 NOTE — Assessment & Plan Note (Signed)
Has completed his radiation and is still following with urology and oncology has repeat labs soon

## 2021-06-08 NOTE — Assessment & Plan Note (Addendum)
Patient encouraged to maintain heart healthy diet, regular exercise, adequate sleep. Consider daily probiotics. Take medications as prescribed. Labs ordered and reviewed. Aged out of screening colonoscopy

## 2021-06-08 NOTE — Assessment & Plan Note (Signed)
Encouraged to get adequate exercise, calcium and vitamin d intake 

## 2021-06-08 NOTE — Assessment & Plan Note (Signed)
Well controlled, no changes to meds. Encouraged heart healthy diet such as the DASH diet and exercise as tolerated.  °

## 2021-06-08 NOTE — Assessment & Plan Note (Signed)
hgba1c acceptable, minimize simple carbs. Increase exercise as tolerated.  

## 2021-06-08 NOTE — Assessment & Plan Note (Signed)
Tolerating statin, encouraged heart healthy diet, avoid trans fats, minimize simple carbs and saturated fats. Increase exercise as tolerated 

## 2021-06-08 NOTE — Patient Instructions (Addendum)
You need 60-80 ounces of fluids, non caffeine and non alcohol, if you drink these need to balance with more water.   Paxlovid and Molnupiravir are the new COVID medication we can give you if you get COVID so make sure you test if you have symptoms because we have to treat by day 5 of symptoms for it to be effective. If you are positive let us know so we can treat. If a home test is negative and your symptoms are persistent get a PCR test. Can check testing locations at [email or web address] If you are positive we will make an appointment with [place] and we will send in Paxlovid or Molnupiravir if you would like it. Check with your pharmacy before we meet to confirm they have it in stock, if they do not then we can get the prescription at the [place] 83 Years and Older, Male Preventive care refers to lifestyle choices and visits with your health care provider that can promote health and wellness. This includes: A yearly physical exam. This is also called an annual wellness visit. Regular dental and eye exams. Immunizations. Screening for certain conditions. Healthy lifestyle choices, such as: Eating a healthy diet. Getting regular exercise. Not using drugs or products that contain nicotine and tobacco. Limiting alcohol use. What can I expect for my preventive care visit? Physical exam Your health care provider will check your: Height and weight. These may be used to calculate your BMI (body mass index). BMI is a measurement that tells if you are at a healthy weight. Heart rate and blood pressure. Body temperature. Skin for abnormal spots. Counseling Your health care provider may ask you questions about your: Past medical problems. Family's medical history. Alcohol, tobacco, and drug use. Emotional well-being. Home life and relationship well-being. Sexual activity. Diet, exercise, and sleep habits. History of falls. Memory and ability to understand  (cognition). Work and work Statistician. Access to firearms. What immunizations do I need? Vaccines are usually given at various ages, according to a schedule. Your health care provider will recommend vaccines for you based on your age, medical history, and lifestyle or other factors, such as travel or where you work. What tests do I need? Blood tests Lipid and cholesterol levels. These may be checked every 5 years, or more often depending on your overall health. Hepatitis C test. Hepatitis B test. Screening Lung cancer screening. You may have this screening every year starting at age 53 if you have a 30-pack-year history of smoking and currently smoke or have quit within the past 15 years. Colorectal cancer screening. All adults should have this screening starting at age 48 and continuing until age 46. Your health care provider may recommend screening at age 28 if you are at increased risk. You will have tests every 1-10 years, depending on your results and the type of screening test. Prostate cancer screening. Recommendations will vary depending on your family history and other risks. Genital exam to check for testicular cancer or hernias. Diabetes screening. This is done by checking your blood sugar (glucose) after you have not eaten for a while (fasting). You may have this done every 1-3 years. Abdominal aortic aneurysm (AAA) screening. You may need this if you are a current or former smoker. STD (sexually transmitted disease) testing, if you are at risk. Follow these instructions at home: Eating and drinking  Eat a diet that includes fresh fruits and vegetables, whole grains, lean protein, and low-fat dairy products. Limit  your intake of foods with high amounts of sugar, saturated fats, and salt. Take vitamin and mineral supplements as recommended by your health care provider. Do not drink alcohol if your health care provider tells you not to drink. If you drink alcohol: Limit how  much you have to 0-2 drinks a day. Be aware of how much alcohol is in your drink. In the [place] one drink equals one 12 oz bottle of beer (355 mL), one 5 oz glass of wine (148 mL), or one 1 oz glass of hard liquor (44 mL). Lifestyle Take daily care of your teeth and gums. Brush your teeth every morning and night with fluoride toothpaste. Floss one time each day. Stay active. Exercise for at least 30 minutes 5 or more days each week. Do not use any products that contain nicotine or tobacco, such as cigarettes, e-cigarettes, and chewing tobacco. If you need help quitting, ask your health care provider. Do not use drugs. If you are sexually active, practice safe sex. Use a condom or other form of protection to prevent STIs (sexually transmitted infections). Talk with your health care provider about taking a low-dose aspirin or statin. Find healthy ways to cope with stress, such as: Meditation, yoga, or listening to music. Journaling. Talking to a trusted person. Spending time with friends and family. Safety Always wear your seat belt while driving or riding in a vehicle. Do not drive: If you have been drinking alcohol. Do not ride with someone who has been drinking. When you are tired or distracted. While texting. Wear a helmet and other protective equipment during sports activities. If you have firearms in your house, make sure you follow all gun safety procedures. What's next? Visit your health care provider once a year for an annual wellness visit. Ask your health care provider how often you should have your eyes and teeth checked. Stay up to date on all vaccines. This information is not intended to replace advice given to you by your health care provider. Make sure you discuss any questions you have with your health care provider. Document Revised: 10/30/2020 Document Reviewed: 08/16/2018 Elsevier Patient Education  2022 [employer].

## 2021-06-09 NOTE — Progress Notes (Signed)
Remote pacemaker transmission.   

## 2021-06-10 ENCOUNTER — Other Ambulatory Visit: Payer: Self-pay

## 2021-06-10 DIAGNOSIS — R748 Abnormal levels of other serum enzymes: Secondary | ICD-10-CM

## 2021-06-10 NOTE — Addendum Note (Signed)
Addended by: Randolm Idol A on: 06/10/2021 09:39 AM   Modules accepted: Orders

## 2021-06-12 LAB — VITAMIN B1: Vitamin B1 (Thiamine): 34 nmol/L — ABNORMAL HIGH (ref 8–30)

## 2021-06-30 DIAGNOSIS — L89892 Pressure ulcer of other site, stage 2: Secondary | ICD-10-CM | POA: Diagnosis not present

## 2021-07-08 DIAGNOSIS — M25572 Pain in left ankle and joints of left foot: Secondary | ICD-10-CM | POA: Diagnosis not present

## 2021-07-08 DIAGNOSIS — M6701 Short Achilles tendon (acquired), right ankle: Secondary | ICD-10-CM | POA: Diagnosis not present

## 2021-07-08 DIAGNOSIS — M76822 Posterior tibial tendinitis, left leg: Secondary | ICD-10-CM | POA: Diagnosis not present

## 2021-07-08 DIAGNOSIS — M6702 Short Achilles tendon (acquired), left ankle: Secondary | ICD-10-CM | POA: Diagnosis not present

## 2021-07-22 ENCOUNTER — Ambulatory Visit (INDEPENDENT_AMBULATORY_CARE_PROVIDER_SITE_OTHER): Payer: PPO | Admitting: Pharmacist

## 2021-07-22 DIAGNOSIS — R7989 Other specified abnormal findings of blood chemistry: Secondary | ICD-10-CM

## 2021-07-22 DIAGNOSIS — R748 Abnormal levels of other serum enzymes: Secondary | ICD-10-CM

## 2021-07-22 DIAGNOSIS — E519 Thiamine deficiency, unspecified: Secondary | ICD-10-CM

## 2021-07-22 DIAGNOSIS — E785 Hyperlipidemia, unspecified: Secondary | ICD-10-CM

## 2021-07-22 DIAGNOSIS — I10 Essential (primary) hypertension: Secondary | ICD-10-CM

## 2021-07-22 DIAGNOSIS — I4821 Permanent atrial fibrillation: Secondary | ICD-10-CM

## 2021-07-22 NOTE — Chronic Care Management (AMB) (Signed)
Chronic Care Management Pharmacy Note  07/22/2021 Name:  Brian Davidson MRN:  [id number] DOB:  07-07-1936  Subjective: Brian Davidson is an 85 y.o. year old male who is a primary patient of Mosie Lukes, MD.  The CCM team was consulted for assistance with disease management and care coordination needs.    Engaged with patient by telephone for follow up visit in response to provider referral for pharmacy case management and/or care coordination services.   Consent to Services:  The patient was given information about Chronic Care Management services, agreed to services, and gave verbal consent prior to initiation of services.  Please see initial visit note for detailed documentation.   Patient Care Team: Mosie Lukes, MD as PCP - General (Family Medicine) Thompson Grayer, MD as PCP - Electrophysiology (Cardiology) Jettie Booze, MD as PCP - Cardiology (Cardiology) Thompson Grayer, MD as Consulting Physician (Cardiology) Franchot Mimes, MD as Consulting Physician (Family Medicine) Armbruster, Carlota Raspberry, MD as Consulting Physician (Gastroenterology) Gaynelle Arabian, MD as Consulting Physician (Orthopedic Surgery) Cira Rue, RN as Oncology Nurse Navigator Cherre Robins, RPH-CPP (Pharmacist)  Recent office visits: 06/08/2021 - Fam Med (Dr Charlett Blake) Preventative health visit. Checked labs. B12 and B1 above normal. Recommended decreasing dose of B12 and B1. Recheck labs in 3 months.  03/16/2021 - PCP (Dr Charlett Blake) Video Visit for cough and congestion / f/u COVID. Recommended Mucinex bid for congestion and MVI with minerals daily. 11/24/2020 - PCP (Dr Charlett Blake) F/U chronic conditions. Labs checked. Med changes -Dr Charlett Blake recommended he consider adding daily Krill Oil Capsule and per lab notes to decrease vitamin B12 to 500 mg sublingual tablets take 1 tablet 3 x a week and then repeat labs in 3 months  Recent consult visits: 06/02/2021 - Cardio -Device check 05/26/2021 - Podiatrist  (Dr Mallie Mussel) seen for corns and callosities 04/01/2021 - Cardiothoracic surgery (Dr Orvan Seen) f/u ascending aortic aneurysm. Noted to be stable < 5cm in maximal diameter. Recommended repeat imaging in 1 year.  03/05/2021 - completed radiation therapy for prostate cancer 03/03/2021 - Cardio (RN visit) Pacemaker check 12/30/2020 - Phone Call - Cardio (Dr Irish Lack) patient c/o lightheadedness that is getting worse. Dr Irish Lack recommended hydration and stop irbesartan-HCTZ. Monitor BP. 12/22/2020 - Rad Oncology (Dr Tammi Klippel). Stage T2a adenocarcinoma of the prostate with Gleason score of 4+3, and PSA of 5.98. Discussed the patient's workup and outlined the nature of prostate cancer in this setting.  T stage, Gleason's score, and PSA put him into the unfavorable intermediate risk group. Accordingly, he is eligible for a variety of potential treatment options including brachytherapy, 5.5 weeks of external radiation, or prostatectomy. Cardio conditions noted to make him poor candidate for surgical intervention. At the conclusion of our conversation, the patient is interested in moving forward with 5.5 weeks of external beam therapy without ADT, reserving ADT for future use if the PSA were to continue to rise despite treatment 12/15/2020 - Cardio (Dr Irish Lack) Establish general cardio for non EP issues. Aortic atherosclerosis. Started rosuvastatin 10 mg daily.   Hospital visits: 03/23/2021 - Urgent care with [facility] - impacted cerumen. Procedure performed to remove ear wax.  03/06/2021 - ED Visit for viral pharyngitis / sore throat. COVID positive. Prescribed Molnupiravir 33m bid  Objective:  Lab Results  Component Value Date   CREATININE 1.20 06/08/2021   CREATININE 1.26 02/26/2021   CREATININE 1.22 11/24/2020    Lab Results  Component Value Date   HGBA1C 5.8 06/08/2021  Last diabetic Eye exam: No results found for: HMDIABEYEEXA  Last diabetic Foot exam: No results found for: HMDIABFOOTEX       Component Value Date/Time   CHOL 102 06/08/2021 1003   TRIG 85.0 06/08/2021 1003   HDL 40.30 06/08/2021 1003   CHOLHDL 3 06/08/2021 1003   VLDL 17.0 06/08/2021 1003   LDLCALC 44 06/08/2021 1003   LDLCALC 107 (H) 05/26/2020 1103    Hepatic Function Latest Ref Rng & Units 06/08/2021 02/26/2021 11/24/2020  Total Protein 6.0 - 8.3 g/dL 6.6 6.3 6.4  Albumin 3.5 - 5.2 g/dL 4.0 3.9 4.0  AST 0 - 37 U/L 22 19 20   ALT 0 - 53 U/L 16 14 29   Alk Phosphatase 39 - 117 U/L 80 69 66  Total Bilirubin 0.2 - 1.2 mg/dL 1.0 0.9 0.8  Bilirubin, Direct 0.0 - 0.3 mg/dL - - -    Lab Results  Component Value Date/Time   TSH 2.38 06/08/2021 10:03 AM   TSH 2.01 11/24/2020 10:47 AM    CBC Latest Ref Rng & Units 06/08/2021 03/19/2021 02/26/2021  WBC 4.0 - 10.5 K/uL 5.8 7.1 5.9  Hemoglobin 13.0 - 17.0 g/dL 13.6 13.5 13.4  Hematocrit 39.0 - 52.0 % 41.7 39.7 40.2  Platelets 150.0 - 400.0 K/uL 135.0(L) 165.0 129.0(L)    Lab Results  Component Value Date/Time   VD25OH 69.87 06/08/2021 10:03 AM    Clinical ASCVD: Yes  The ASCVD Risk score (Arnett DK, et al., 2019) failed to calculate for the following reasons:   The 2019 ASCVD risk score is only valid for ages 69 to 36     Social History   Tobacco Use  Smoking Status Former   Types: Pipe, Landscape architect   Quit date: 11/18/1985   Years since quitting: 35.6  Smokeless Tobacco Never   BP Readings from Last 3 Encounters:  06/08/21 112/76  04/01/21 140/70  03/06/21 (!) 149/88   Pulse Readings from Last 3 Encounters:  06/08/21 79  04/01/21 67  03/06/21 67   Wt Readings from Last 3 Encounters:  06/08/21 220 lb 3.2 oz (99.9 kg)  04/01/21 222 lb (100.7 kg)  03/06/21 221 lb (100.2 kg)    Assessment: Review of patient past medical history, allergies, medications, health status, including review of consultants reports, laboratory and other test data, was performed as part of comprehensive evaluation and provision of chronic care management services.    SDOH:  (Social Determinants of Health) assessments and interventions performed:  SDOH Interventions    Flowsheet Row Most Recent Value  SDOH Interventions   Financial Strain Interventions Other (Comment)  [patient was able to purchase 3 month supply of Xarelto and will last until Medicare benefits reset in 2023.]       CCM Care Plan  Allergies  Allergen Reactions   Cardizem [Diltiazem]     Rash from Yellow dye in generic capsule.   Can take different brand. Takes diltiazem - his allergy is to Cardizem   Sulfa Antibiotics     Unknown childhood reaction    Medications Reviewed Today     Reviewed by Cherre Robins, RPH-CPP (Pharmacist) on 07/22/21 at [phone]  Med List Status: <None>   Medication Order Taking? Sig Documenting Provider Last Dose Status Informant  Biotin 5000 MCG TABS [id number] Yes Take 5,000 mg by mouth daily. [provider] Taking Active Self  Calcium Carbonate-Vitamin D (CALTRATE 600+D PO) [id number] Yes Take 600 mg by mouth daily.  [provider] Taking Active Self  Cholecalciferol 50  MCG (2000 UT) TABS [id number] Yes Take 2,000 Units by mouth daily.  [provider] Taking Active Self  diltiazem (CARDIZEM CD) 240 MG 24 hr capsule [id number] Yes TAKE ONE (1) CAPSULE EACH DAY BY MOUTH Mosie Lukes, MD Taking Active   fluticasone (FLONASE) 50 MCG/ACT nasal spray [id number] Yes Place 2 sprays into both nostrils daily. Mosie Lukes, MD Taking Active   gabapentin (NEURONTIN) 300 MG capsule [id number] Yes Take 1 capsule by mouth twice daily Mosie Lukes, MD Taking Active   magnesium oxide (MAG-OX) 400 MG tablet [id number] Yes Take 400 mg by mouth daily. [provider] Taking Active Self  metoprolol tartrate (LOPRESSOR) 100 MG tablet [id number] Yes Take 1 tablet by mouth twice daily Mosie Lukes, MD Taking Active   Multiple Vitamins-Minerals (MULTIVITAMIN WITH MINERALS) tablet [id number] Yes Take 1 tablet by mouth daily. Mosie Lukes, MD Taking Active   rosuvastatin (CRESTOR) 10 MG tablet [id number] Yes Take 1 tablet (10 mg total) by mouth daily. Jettie Booze, MD Taking Active   Turmeric 500 MG TABS [id number] Yes Take 500 mg by mouth daily.  [provider] Taking Active Self  XARELTO 20 MG TABS tablet [id number] Yes Take 1 tablet by mouth once daily Mosie Lukes, MD Taking Active   Med List Note Starleen Arms 05/27/15 1156):               Patient Active Problem List   Diagnosis Date Noted   DVVOH-60 03/17/2021   Hyperglycemia 11/24/2020   Malignant neoplasm of prostate ([place]) 11/24/2020   Thoracic aortic aneurysm 05/27/2020   Primary osteoarthritis of right knee 12/23/2019   Symptomatic bradycardia 05/31/2019   Vertigo 07/27/2018   Multiple closed fractures of ribs of right side 01/21/2018   Thiamine deficiency 01/19/2018   Peripheral neuropathy 07/13/2017   Hyperlipidemia 07/13/2017   Preventative health care 01/11/2017   Carotid artery disease ([place]) 07/01/2016   Mitral valve disorder 07/01/2016   Low back pain 12/31/2015   Osteopenia 12/31/2015   Allergic state 12/31/2015   Skin lesion 12/31/2015   History of chicken pox    Calculi, ureter 03/30/2015   OA (osteoarthritis) of knee 05/10/2014   Pacemaker-St.Jude 02/01/2012   Permanent atrial fibrillation ([place])    Hypertension    Heart murmur    Chronic anticoagulation     Immunization History  Administered Date(s) Administered   Influenza, High Dose Seasonal PF 06/18/2017, 06/11/2018, 04/29/2019, 04/28/2020, 05/10/2021   Influenza-Unspecified 06/11/2016   PFIZER(Purple Top)SARS-COV-2 Vaccination 09/24/2019, 10/10/2019, 05/30/2020, 04/22/2021   Pfizer Covid-19 Vaccine Bivalent Booster 34yr & up 05/29/2021   Pneumococcal Conjugate-13 07/20/2015   Pneumococcal Polysaccharide-23 09/05/2016   Tdap 11/14/2014, 05/29/2019   Zoster Recombinat (Shingrix) 12/26/2016, 03/21/2017    Conditions to be  addressed/monitored: Atrial Fibrillation, CAD, HTN, HLD and pre DM; prostate cancer (active) neuropathy  Care Plan : General Pharmacy (Adult)  Updates made by ECherre Robins RPH-CPP since 07/22/2021 12:00 AM     Problem: Chronic Disease Management support, education, and care coordination needs related to Hypertension, Hyperlipidemia/CAD, AFib, Osteopenia, Neuropathy/Pain   Priority: High  Onset Date: 01/19/2021  Note:   Current Barriers:  Unable to independently afford treatment regimen Unable to achieve control of lipids   Chronic Disease Management support, education, and care coordination needs related to Hypertension, Hyperlipidemia/CAD, AFib, Osteopenia, Neuropathy/Pain  Pharmacist Clinical Goal(s):  Over the next 180 days, patient will verbalize ability to afford treatment regimen achieve control of lipids as evidenced by LDL <  70 maintain control of blood pressure / hypertension  as evidenced by blood pressure  <130/80  through collaboration with PharmD and provider.   Interventions: 1:1 collaboration with Mosie Lukes, MD regarding development and update of comprehensive plan of care as evidenced by provider attestation and co-signature Inter-disciplinary care team collaboration (see longitudinal plan of care) Comprehensive medication review performed; medication list updated in electronic medical record   Hypertension / Thoracic Aortic Aneurysm BP improving; goal is to maintain BP goal <130/80 Patient stopped irbesartan-HCTZ due to dizziness - per Dr Irish Lack in early 2022 Denies dizziness since changed diltiazem to take in the afternoon  Current regimen:  Metoprolol tartrate 120m twice daily Diltiazem 24102mdaily each afternoon Interventions:.  Discussed blood pressure goals Continue to check blood pressure 2 to 3 times per week and record Ensure daily salt intake < 2300 mg/day Continue current medications  Hyperlipidemia At goal; LDL goal < 70 Current regimen:   Rosuvastatin 1067maily (started April 2022)  Interventions: Discussed LDL goal; since started rosuvastatin LDL is now at goal Continue to take rosuvastatin daily Recommend at least 150 minutes of exercise per week  Osteopenia Current regimen:  Calcium 600m38mily Vitamin D 2000 units daily  Interventions: (addressed at previous visit) Consider repeat DEXA Scan Recommended maintain osteopenia medication regimen   Neuropathy Per patient neuropathic is controlled with current therapy Last B12 was elevated at 1048 (06/08/2021) Last B1 was lower but still above normal at 34 (06/08/2021)  Patient was advised by PCP to lower dose of B12 and B1 or stop both supplements and recheck labs in January 2023.  Current regimen:  Gabapentin 300mg69mce a day Biotin 1 tablet daily    Interventions:  Updated medication list to remove vitamin B12 and B1 supplements Added order to check B1 level in January along with B12   Atrial Fibrillation:  HR controlled since last visit; patient is taking stroke prevention therapy daily CHADS2VASc score: 4 Current regimen:  Diltiazem 240mg 15my for heart rate  Xarelto 20mg d67m to prevent stroke  Interventions: Applied for medication assistance program for Xarelto in August 2022 but patient did not qualify.   Reviewed last SCr and eGFR - last eGFR was 55.84 (06/08/2021). Dose of Xarelto currently appropriate. If eGFR drops to < 50 should consider lower Xarelto dose of 15mg da50m.  Continue current medications for atrial fibrillation  Pre-Diabetes: Controlled; A1c goal is <6.5% Current treatment: Diet and exercise  Does not check BG at home Usually exercises 5 to 7 days per week. Will take off during radiation therapy that will start 01/26/21.  Interventions:  Continue to limiting sugar and carbohydrate intake Continue as planned to restart exercise after upcoming radiation therapy.   Medication management Pharmacist Clinical Goal(s): Over the  next 90 days, patient will work with PharmD and providers to maintain optimal medication adherence Current pharmacy: [facility] ([facility]em [facility] Comprehensive medication review performed. Continue current medication management strategy  Patient Goals/Self-Care Activities Over the next 180 days, patient will:  take medications as prescribed, check blood pressure 2 to 3 times per week, document, and provide at future appointments, and collaborate with provider on medication access solutions  Follow Up Plan: Telephone follow up appointment with care management team member scheduled for:    3 to 4 months.     Medication Assistance: Applied for medication assistance program for Xarelto. Patient did not meet 4% of income out of pocket spend.   Patient's preferred pharmacy  is:  [facility] - [place], [place] - [address] [place] [address] Phone: [phone] Fax: [phone]  [facility] [address] [place], [place] - [address] [place] [address] Phone: [phone] Fax: [phone]  [place], [place] - [address] [place] [address] Phone: [phone] Fax: [phone]   Follow Up:  Patient agrees to Care Plan and Follow-up.  Plan: Telephone follow up appointment with care management team member scheduled for:   3 months  Cherre Robins, PharmD Clinical Pharmacist [place] [facility]

## 2021-07-22 NOTE — Patient Instructions (Addendum)
[name] It was a pleasure speaking with you today.  I have attached a summary of our visit today and information about your health goals.  If you have any questions or concerns, please feel free to contact me either at the phone number below or with a MyChart message.   Keep up the good work!  Cherre Robins, PharmD Clinical Pharmacist [facility] [phone] (direct line)  [phone] (main office number)   Telephone follow up appointment with care management team member scheduled for: Thursday, Jan 27 2022 at 8:30am  If you need to cancel or re-schedule our visit, please call [phone] and our care guide team will be happy to assist you.   Patient verbalizes understanding of instructions provided today and agrees to view in [place].   Hypertension / Aneurysm BP Readings from Last 3 Encounters:  06/08/21 112/76  04/01/21 140/70  03/06/21 (!) 149/88   Pharmacist Clinical Goal(s): Over the next 90 days, patient will work with PharmD and providers to maintain BP goal <130/80 Current regimen:  Metoprolol tartrate 100mg  twice daily Diltiazem 240mg  daily each afternoon Interventions: Discussed blood pressure goals and tips to getting accurate blood pressure reading Requested patient to check blood pressure 2 to 3 times per week and record Patient self care activities - Over the next 90 days, patient will: Continue current therapy for blood pressure / heart rate Check blood pressure 2 to 3 times per week, document, and provide at future appointments Ensure daily salt intake < 2300 mg/day  Hyperlipidemia Lab Results  Component Value Date/Time   LDLCALC 44 06/08/2021 10:03 AM   LDLCALC 107 (H) 05/26/2020 11:03 AM   Pharmacist Clinical Goal(s): Over the next 90 days, patient will work with PharmD and providers to maintain LDL goal < 70 Current regimen:  Rosuvastatin 10mg  daily (started April 2022)  Interventions: Discussed LDL  goal Patient self care activities - Over the next 90 days, patient will: Continue to take rosuvastatin daily Goal is to get 150 minutes of exercise per week - restart activity after radiation therapy complete.   Osteopenia Pharmacist Clinical Goal(s) Over the next 90 days, patient will work with PharmD and providers to reduce risk of fracture due to osteopenia Current regimen:  Calcium 600mg  daily Vitamin D 2000 units daily  Interventions: Consider repeat DEXA Scan Patient self care activities - Over the next 90 days, patient will: Maintain osteopenia medication regimen    Neuropathy Pharmacist Clinical Goal(s) Over the next 90 days, patient will work with PharmD and providers to reduce risk symptoms of neuropathy and ensure adequate supplementation of vitamin deficiencies associated with neuropathy.  Current regimen:  Gabapentin 300mg  twice a day Biotin 1 tablet daily   Patient self care activities - Over the next 90 days, patient will: Come in for labs in January 2023 - will recheck B12 and B1 levels.    Atrial Fibrillation:  Pharmacist Clinical Goal(s) Over the next 90 days, patient will work with PharmD and providers to identify potential programs to assist with Xarelto cost during coverage gap Current regimen:  Diltiazem 240mg  daily for heart rate (take each afternoon)  Xarelto 20mg  daily to prevent stroke  Interventions: Patient did not meet assistance program required 4% of income out of pocket spend in 2022.  Patient self care activities - Over the next 90 days, patient will: Continue current medications for atrial fibrillation  Medication management Pharmacist Clinical Goal(s): Over the next 90 days, patient will work with PharmD and providers  to maintain optimal medication adherence Current pharmacy: [facility] Interventions Comprehensive medication review performed. Continue current medication management strategy Patient self care activities - Over the next 90  days, patient will: Focus on medication adherence by filling and taking medications appropriately  Take medications as prescribed Report any questions or concerns to PharmD and/or provider(s)

## 2021-08-02 DIAGNOSIS — C61 Malignant neoplasm of prostate: Secondary | ICD-10-CM | POA: Diagnosis not present

## 2021-08-03 DIAGNOSIS — L249 Irritant contact dermatitis, unspecified cause: Secondary | ICD-10-CM | POA: Diagnosis not present

## 2021-08-03 DIAGNOSIS — L570 Actinic keratosis: Secondary | ICD-10-CM | POA: Diagnosis not present

## 2021-08-04 DIAGNOSIS — I4821 Permanent atrial fibrillation: Secondary | ICD-10-CM | POA: Diagnosis not present

## 2021-08-04 DIAGNOSIS — E785 Hyperlipidemia, unspecified: Secondary | ICD-10-CM | POA: Diagnosis not present

## 2021-08-04 DIAGNOSIS — M79671 Pain in right foot: Secondary | ICD-10-CM | POA: Diagnosis not present

## 2021-08-04 DIAGNOSIS — I10 Essential (primary) hypertension: Secondary | ICD-10-CM

## 2021-08-04 DIAGNOSIS — Z87891 Personal history of nicotine dependence: Secondary | ICD-10-CM | POA: Diagnosis not present

## 2021-08-04 DIAGNOSIS — B351 Tinea unguium: Secondary | ICD-10-CM | POA: Diagnosis not present

## 2021-08-04 DIAGNOSIS — G629 Polyneuropathy, unspecified: Secondary | ICD-10-CM

## 2021-08-04 DIAGNOSIS — M79672 Pain in left foot: Secondary | ICD-10-CM | POA: Diagnosis not present

## 2021-08-04 DIAGNOSIS — R7303 Prediabetes: Secondary | ICD-10-CM

## 2021-08-04 DIAGNOSIS — L84 Corns and callosities: Secondary | ICD-10-CM | POA: Diagnosis not present

## 2021-08-10 ENCOUNTER — Telehealth: Payer: Self-pay | Admitting: *Deleted

## 2021-08-13 ENCOUNTER — Other Ambulatory Visit: Payer: Self-pay

## 2021-08-13 ENCOUNTER — Encounter: Payer: Self-pay | Admitting: Internal Medicine

## 2021-08-13 ENCOUNTER — Ambulatory Visit: Payer: PPO | Admitting: Internal Medicine

## 2021-08-13 VITALS — BP 120/66 | HR 75 | Ht 72.0 in | Wt 219.0 lb

## 2021-08-13 DIAGNOSIS — I779 Disorder of arteries and arterioles, unspecified: Secondary | ICD-10-CM

## 2021-08-13 DIAGNOSIS — I4821 Permanent atrial fibrillation: Secondary | ICD-10-CM

## 2021-08-13 DIAGNOSIS — R001 Bradycardia, unspecified: Secondary | ICD-10-CM | POA: Diagnosis not present

## 2021-08-13 NOTE — Progress Notes (Signed)
PCP: Brian Lukes, MD   Primary EP:  Brian Davidson is a 85 y.o. male who presents today for routine electrophysiology followup.  Since last being seen in our clinic, the patient reports doing very well.  Today, he denies symptoms of palpitations, chest pain, shortness of breath,  lower extremity edema, dizziness, presyncope, or syncope.  The patient is otherwise without complaint today.   Past Medical History:  Diagnosis Date   Arthritis    Biliary dyskinesia    Carotid artery disease ([place]) 07/01/2016   Dyslipidemia 07/13/2017   Dysrhythmia    a fib   GERD (gastroesophageal reflux disease)    occasional   H/O measles    History of chicken pox    History of kidney stones    Hypertension    Neuromuscular disorder (HCC)    neuropathy feet    Persistent atrial fibrillation (HCC)    Pneumonia 1992   Presence of permanent cardiac pacemaker    Prostate cancer ([place])    Tachycardia-bradycardia ([place])    a. s/p STJ dual chamber pacemaker   Thiamine deficiency 01/19/2018   Valvular heart disease 07/01/2016   Past Surgical History:  Procedure Laterality Date   CHOLECYSTECTOMY N/A 11/24/2015   Procedure: LAPAROSCOPIC CHOLECYSTECTOMY;  Surgeon: Greer Pickerel, MD;  Location: [facility];  Service: General;  Laterality: N/A;   CYSTOSCOPY  2016   EYE SURGERY     bil cataract   FRACTURE SURGERY  2005   left ankle   HERNIA REPAIR     left groin, no mesh   PERMANENT PACEMAKER INSERTION N/A 01/31/2012   SJM Accent SR RF implanted by Brian Kaytelyn Glore   TOTAL KNEE ARTHROPLASTY Right 12/23/2019   Procedure: TOTAL KNEE ARTHROPLASTY;  Surgeon: Gaynelle Arabian, MD;  Location: [facility];  Service: Orthopedics;  Laterality: Right;  68min    ROS- all systems are reviewed and negative except as per HPI above  Current Outpatient Medications  Medication Sig Dispense Refill   Biotin 5000 MCG TABS Take 5,000 mg by mouth daily.     Calcium Carbonate-Vitamin D (CALTRATE 600+D PO) Take 600 mg by mouth  daily.      Cholecalciferol 50 MCG (2000 UT) TABS Take 2,000 Units by mouth daily.      diltiazem (CARDIZEM CD) 240 MG 24 hr capsule TAKE ONE (1) CAPSULE EACH DAY BY MOUTH 90 capsule 1   fluticasone (FLONASE) 50 MCG/ACT nasal spray Place 2 sprays into both nostrils daily. 16 g 1   gabapentin (NEURONTIN) 300 MG capsule Take 1 capsule by mouth twice daily 180 capsule 1   magnesium oxide (MAG-OX) 400 MG tablet Take 400 mg by mouth daily.     metoprolol tartrate (LOPRESSOR) 100 MG tablet Take 1 tablet by mouth twice daily 180 tablet 0   Multiple Vitamins-Minerals (MULTIVITAMIN WITH MINERALS) tablet Take 1 tablet by mouth daily. 30 tablet 3   rosuvastatin (CRESTOR) 10 MG tablet Take 1 tablet (10 mg total) by mouth daily. 90 tablet 3   Turmeric 500 MG TABS Take 500 mg by mouth daily.      XARELTO 20 MG TABS tablet Take 1 tablet by mouth once daily 90 tablet 1   No current facility-administered medications for this visit.    Physical Exam: Vitals:   08/13/21 1555  BP: 120/66  Pulse: 75  SpO2: 99%  Weight: 219 lb (99.3 kg)  Height: 6' (1.829 m)    GEN- The patient is well appearing, alert and  oriented x 3 today.   Head- normocephalic, atraumatic Eyes-  Sclera clear, conjunctiva pink Ears- hearing intact Oropharynx- clear Lungs-  normal work of breathing Chest- pacemaker pocket is well healed Heart- iRRR GI- soft,  Extremities- no clubbing, cyanosis, or edema  Pacemaker interrogation- reviewed in detail today,  See PACEART report  ekg tracing ordered today is personally reviewed and shows afib, demand V pacing  Assessment and Plan:  1. Symptomatic bradycardia  Normal function See Brian Davidson Art report No changes today he is not device dependant today  2. Permanent afib Rate controlled On xarelto for chads2vasc score of 4 Labs reviewed  3. HTN Stable No change required today  Return to see EP APP in a year  Thompson Grayer MD, [facility] 08/13/2021 4:16 PM

## 2021-08-13 NOTE — Patient Instructions (Addendum)
Medication Instructions:  Your physician recommends that you continue on your current medications as directed. Please refer to the Current Medication list given to you today. *If you need a refill on your cardiac medications before your next appointment, please call your pharmacy*  Lab Work: None. If you have labs (blood work) drawn today and your tests are completely normal, you will receive your results only by: [place] (if you have MyChart) OR A paper copy in the mail If you have any lab test that is abnormal or we need to change your treatment, we will call you to review the results.  Testing/Procedures: None.  Follow-Up: At [facility], you and your health needs are our priority.  As part of our continuing mission to provide you with exceptional heart care, we have created designated Provider Care Teams.  These Care Teams include your primary Cardiologist (physician) and Advanced Practice Providers (APPs -  Physician Assistants and Nurse Practitioners) who all work together to provide you with the care you need, when you need it.  Your physician wants you to follow-up in: 12 months with  Thompson Grayer, MD or one of the following Advanced Practice Providers on your designated Care Team:    Legrand Como "Jonni Sanger" Chalmers Cater, [place]   You will receive a reminder letter in the mail two months in advance. If you don't receive a letter, please call our office to schedule the follow-up appointment.  Remote monitoring is used to monitor your Pacemaker from home. This monitoring reduces the number of office visits required to check your device to one time per year. It allows [place] to keep an eye on the functioning of your device to ensure it is working properly. You are scheduled for a device check from home on 09/01/21. You may send your transmission at any time that day. If you have a wireless device, the transmission will be sent automatically. After your physician reviews your transmission, you will  receive a postcard with your next transmission date.  We recommend signing up for the patient portal called "MyChart".  Sign up information is provided on this After Visit Summary.  MyChart is used to connect with patients for Virtual Visits (Telemedicine).  Patients are able to view lab/test results, encounter notes, upcoming appointments, etc.  Non-urgent messages can be sent to your provider as well.   To learn more about what you can do with MyChart, go to [email or web address].    Any Other Special Instructions Will Be Listed Below (If Applicable).

## 2021-08-17 ENCOUNTER — Encounter: Payer: Self-pay | Admitting: Physical Therapy

## 2021-08-17 ENCOUNTER — Ambulatory Visit: Payer: PPO | Attending: Orthopaedic Surgery | Admitting: Physical Therapy

## 2021-08-17 ENCOUNTER — Other Ambulatory Visit: Payer: Self-pay

## 2021-08-17 DIAGNOSIS — M25672 Stiffness of left ankle, not elsewhere classified: Secondary | ICD-10-CM | POA: Insufficient documentation

## 2021-08-17 DIAGNOSIS — M25572 Pain in left ankle and joints of left foot: Secondary | ICD-10-CM | POA: Diagnosis not present

## 2021-08-17 DIAGNOSIS — M6281 Muscle weakness (generalized): Secondary | ICD-10-CM | POA: Insufficient documentation

## 2021-08-17 NOTE — Therapy (Addendum)
[facility] [address]  [place], [place], [address] Phone: [phone]   Fax:  [phone]  Physical Therapy Evaluation / Discharge Summary  Patient Details  Name: CURBY CARSWELL MRN: [id number] Date of Birth: 1936/01/27 Referring Provider (PT): Armond Hang, MD   Encounter Date: 08/17/2021   PT End of Session - 08/17/21 1015     Visit Number 1    Date for PT Re-Evaluation 09/16/21    Authorization Type [employer]    PT Start Time 1015    PT Stop Time 1057    PT Time Calculation (min) 42 min    Activity Tolerance Patient tolerated treatment well    Behavior During Therapy [facility] for tasks assessed/performed             Past Medical History:  Diagnosis Date   Arthritis    Biliary dyskinesia    Carotid artery disease ([place]) 07/01/2016   Dyslipidemia 07/13/2017   Dysrhythmia    a fib   GERD (gastroesophageal reflux disease)    occasional   H/O measles    History of chicken pox    History of kidney stones    Hypertension    Neuromuscular disorder (HCC)    neuropathy feet    Persistent atrial fibrillation ([place])    Pneumonia 1992   Presence of permanent cardiac pacemaker    Prostate cancer ([place])    Tachycardia-bradycardia ([place])    a. s/p STJ dual chamber pacemaker   Thiamine deficiency 01/19/2018   Valvular heart disease 07/01/2016    Past Surgical History:  Procedure Laterality Date   CHOLECYSTECTOMY N/A 11/24/2015   Procedure: LAPAROSCOPIC CHOLECYSTECTOMY;  Surgeon: Greer Pickerel, MD;  Location: [facility];  Service: General;  Laterality: N/A;   CYSTOSCOPY  2016   EYE SURGERY     bil cataract   FRACTURE SURGERY  2005   left ankle   HERNIA REPAIR     left groin, no mesh   PERMANENT PACEMAKER INSERTION N/A 01/31/2012   SJM Accent SR RF implanted by DR Allred   TOTAL KNEE ARTHROPLASTY Right 12/23/2019   Procedure: TOTAL KNEE ARTHROPLASTY;  Surgeon: Gaynelle Arabian, MD;  Location: [facility];   Service: Orthopedics;  Laterality: Right;  68mn    There were no vitals filed for this visit.    Subjective Assessment - 08/17/21 1017     Subjective Pt reports he went to the MD in March d/t pain and swelling in L ankle - told he had OA related to h/o fracture in 2005 and put in a brace at the time. In late Oct/early Nov, swelling returned with pain in medial foot/ankle which has now resolved. Denies pain currently. Walks a couple miles a day at least 4 days/wk. When pain does occur he wears the brace for a few days and that seems to take care of it.    Patient Stated Goals "Not sure I need PT"    Currently in Pain? No/denies                [facility] - 08/17/21 1015       Assessment   Medical Diagnosis L posterior tibialis tendinitis & B Achilles tendon contractures    Referring Provider (PT) DArmond Hang MD    Onset Date/Surgical Date 07/08/21    Hand Dominance Right    Next MD Visit PRN    Prior Therapy none for current problem; remote h/o PT for plantar fasciitis; PT  s/p R TKA      Precautions   Precautions None      Restrictions   Weight Bearing Restrictions No      Balance Screen   Has the patient fallen in the past 6 months No    Has the patient had a decrease in activity level because of a fear of falling?  No    Is the patient reluctant to leave their home because of a fear of falling?  No      Home Ecologist residence    Living Arrangements Spouse/significant other    Type of [place] to enter;Ramped entrance    [place] of Steps [place] One level      Prior Function   Level of [place] Retired    Leisure walk ~1.5-2.5 miles at least 4x/wk, yard work      Charity fundraiser Status Within [employer] for tasks assessed      Sensation   Light Touch Impaired by gross assessment    Additional Comments some numbness  in L foot related to prior fracture + L>R peripheral neuropathy      Coordination   Gross Motor Movements are Fluid and Coordinated Yes      Posture/Postural Control   Posture/Postural Control Postural limitations    Posture Comments L>R pes planus in standing      ROM / Strength   AROM / PROM / Strength AROM;Strength      AROM   AROM Assessment Site Ankle    Right/Left Ankle Right;Left    Right Ankle Dorsiflexion 5    Right Ankle Plantar Flexion 51    Right Ankle Inversion 25    Right Ankle Eversion 14    Left Ankle Dorsiflexion 6    Left Ankle Plantar Flexion 50    Left Ankle Inversion 18    Left Ankle Eversion 14      Strength   Strength Assessment Site Ankle;Knee;Hip    Right/Left Hip Right;Left    Right Hip Flexion 4/5    Right Hip Extension 4/5    Right Hip External Rotation  4+/5    Right Hip Internal Rotation 4+/5    Right Hip ABduction 4+/5    Right Hip ADduction 4+/5    Left Hip Flexion 4/5    Left Hip Extension 4/5    Left Hip External Rotation 4+/5    Left Hip Internal Rotation 4+/5    Left Hip ABduction 4+/5    Left Hip ADduction 4+/5    Right/Left Knee Right;Left    Right Knee Flexion 5/5    Right Knee Extension 5/5    Left Knee Flexion 5/5    Left Knee Extension 5/5    Right/Left Ankle Right;Left    Right Ankle Dorsiflexion 5/5    Right Ankle Plantar Flexion 4/5   9 SLS heel raises   Right Ankle Inversion 4+/5    Right Ankle Eversion 4+/5    Left Ankle Dorsiflexion 5/5    Left Ankle Plantar Flexion 4/5   9 SLS heel raises   Left Ankle Inversion 4/5    Left Ankle Eversion 4/5                        Objective measurements completed on examination: See above findings.       [facility] Adult PT  Treatment/Exercise - 08/17/21 1015       Exercises   Exercises Ankle      Ankle Exercises: Stretches   Gastroc Stretch Limitations standing runner's stretch & toes on baseboard      Ankle Exercises: Standing   Heel Raises Limitations B  concentric with alternating SLS eccentric lowering      Ankle Exercises: Seated   Other Seated Ankle Exercises L 4-way ankle with green TB x 10   pt to perform bilaterally at home                    PT Education - 08/17/21 1054     Education Details PT eval findings, initial HEP (Access Code: [email or web address]) and plan for 30-day hold    Person(s) Educated Patient    Methods Explanation;Demonstration;Verbal cues;Handout    Comprehension Verbalized understanding;Verbal cues required;Returned demonstration                 PT Long Term Goals - 08/17/21 1123       PT LONG TERM GOAL #1   Title Patient will be independent with ongoing HEP for self-management at home    Status Achieved    Target Date 08/17/21                    Plan - 08/17/21 1057     Clinical Impression Statement Yvone Neu is an 37 y/ male who presents to OP PT for L posterior tibialis tendinitis and B Achilles tendon contractures. He reports initial onset of pain and swelling in March 2022 - was told it was OA resulting from his h/o ankle fracture in 2005 and given an ankle brace. Pain resolved but then returned in medial ankle posterior to medial malleolus in early Nov 2022 at which time he was referred to PT for above diagnoses. Pain has since resolved with short-term use of ankle brace. He denies any current pain or limitations related to either ankle and has been able to keep up with his walking 1.5-2.5 miles at least 4x/wk w/o restriction. Assessment revealing mild limitations in L>R ankle ROM and strength along with mild proximal LE weakness in hip flexion/extension. Yvone Neu was provided with a HEP to address the above deficits and does not feel the need to return regularly to PT but would like to remain on hold for 30-days in the event that issues arise with the HEP or the pain were to return.    Personal Factors and Comorbidities Comorbidity 3+;Time since onset of injury/illness/exacerbation;Past/Current  Experience;Age    Comorbidities L ankle fracture s/p ORIF and ligamentous reconstruction 2005, peripheral neuropathy, R TKA 12/23/2019, PPM, afib, CAD, MV disorder, HTN, thoracic aortic aneurysm, OA, osteopenia, LBP, vertigo, prostate cancer    Stability/Clinical Decision Making Stable/Uncomplicated    Clinical Decision Making Low    Rehab Potential Excellent    PT Frequency One time visit    PT Treatment/Interventions ADLs/Self Care Home Management;Therapeutic exercise;Patient/family education;Cryotherapy;Electrical Stimulation;Iontophoresis 46m/ml Dexamethasone;Gait training;Stair training;Functional mobility training;Therapeutic activities;Balance training;Neuromuscular re-education;Manual techniques;Scar mobilization;Passive range of motion;Dry needling;Taping;Vasopneumatic Device   if pt needs to return to PT   PT Next Visit Plan 30-day hold    PT Home Exercise Plan Access Code: [email or web address]    Consulted and Agree with Plan of Care Patient             Patient will benefit from skilled therapeutic intervention in order to improve the following deficits and impairments:  Decreased range of motion, Decreased strength, Increased edema, Impaired  flexibility, Postural dysfunction, Pain  Visit Diagnosis: Stiffness of left ankle, not elsewhere classified  Muscle weakness (generalized)  Pain in left ankle and joints of left foot     Problem List Patient Active Problem List   Diagnosis Date Noted   COVID-19 03/17/2021   Hyperglycemia 11/24/2020   Malignant neoplasm of prostate ([place]) 11/24/2020   Thoracic aortic aneurysm 05/27/2020   Primary osteoarthritis of right knee 12/23/2019   Symptomatic bradycardia 05/31/2019   Vertigo 07/27/2018   Multiple closed fractures of ribs of right side 01/21/2018   Thiamine deficiency 01/19/2018   Peripheral neuropathy 07/13/2017   Hyperlipidemia 07/13/2017   Preventative health care 01/11/2017   Carotid artery disease ([place]) 07/01/2016   Mitral  valve disorder 07/01/2016   Low back pain 12/31/2015   Osteopenia 12/31/2015   Allergic state 12/31/2015   Skin lesion 12/31/2015   History of chicken pox    Calculi, ureter 03/30/2015   OA (osteoarthritis) of knee 05/10/2014   Pacemaker-St.Jude 02/01/2012   Permanent atrial fibrillation ([place])    Hypertension    Heart murmur    Chronic anticoagulation     Percival Spanish, PT 08/17/2021, 11:27 AM  [facility] [address]  [place], [place], [address] Phone: [phone]   Fax:  [phone]  Name: GODWIN TEDESCO MRN: [id number] Date of Birth: [birth year]  PHYSICAL THERAPY DISCHARGE SUMMARY  Visits from Start of Care: 1  Current functional level related to goals / functional outcomes:   Refer to above clinical impression for status as of last visit on 08/17/2021. Patient was placed on hold for 30 days and has not needed to return to PT, therefore will proceed with discharge from PT for this episode.   Remaining deficits:   As above.   Education / Equipment:   HEP   Patient agrees to discharge. Patient goals were met. Patient is being discharged due to not returning since the last visit.  Percival Spanish, PT, MPT 09/23/21, 9:27 AM  [facility] [address]  [place], [place], [address] Phone: [phone]   Fax:  [phone]

## 2021-08-17 NOTE — Patient Instructions (Signed)
° ° °  Access Code: [email or web address] URL: [email or web address] Date: 08/17/2021 Prepared by: Annie Paras  Exercises Gastroc Stretch on Wall - 2-3 x daily - 7 x weekly - 3 reps - 30 sec hold Gastroc Stretch with Foot at Wall - 2-3 x daily - 7 x weekly - 3 reps - 30 sec hold Seated Ankle Plantar Flexion with Resistance Loop - 1 x daily - 7 x weekly - 2 sets - 10 reps - 3 sec hold Seated Ankle Dorsiflexion with Resistance - 1 x daily - 7 x weekly - 2 sets - 10 reps - 3 sec hold Seated Ankle Eversion with Resistance - 1 x daily - 7 x weekly - 2 sets - 10 reps - 3 sec hold Seated Ankle Inversion with Resistance - 1 x daily - 7 x weekly - 2 sets - 10 reps - 3 sec hold Standing Eccentric Heel Raise - 1 x daily - 7 x weekly - 2 sets - 10 reps - 3 sec hold

## 2021-08-20 ENCOUNTER — Ambulatory Visit: Payer: PPO

## 2021-08-23 ENCOUNTER — Other Ambulatory Visit: Payer: Self-pay

## 2021-08-23 ENCOUNTER — Other Ambulatory Visit: Payer: Self-pay | Admitting: Family Medicine

## 2021-08-23 ENCOUNTER — Encounter: Payer: Self-pay | Admitting: *Deleted

## 2021-08-23 ENCOUNTER — Inpatient Hospital Stay: Payer: PPO | Attending: Urology | Admitting: *Deleted

## 2021-08-23 DIAGNOSIS — C61 Malignant neoplasm of prostate: Secondary | ICD-10-CM

## 2021-08-23 NOTE — Progress Notes (Signed)
2 Identifiers used for verification purposes only.SCP reviewed and completed.SDOH assessed and completed. Vital signs unable to be taken. This was a telephone visit. Pt says all his urinary issues have resolved since radiation. He is sleeping well only has to get up once during the night to the bathroom. Pt has been back for 3 month visit with urologist and PSA numbers were decreased. Marland Kitchen He will see Dr. Junious Silk in Jan.2023. Pt is walking 3-4x a week for an hour or so with a friend. He is up to date on vaccines.Last colonoscopy was in 2012. He does see a dermatologist annually. Last bone density was in 2019. He has a hx of osteopenia, therefore I have ordered a DEXA.

## 2021-08-24 ENCOUNTER — Encounter: Payer: PPO | Admitting: Physical Therapy

## 2021-08-24 DIAGNOSIS — L249 Irritant contact dermatitis, unspecified cause: Secondary | ICD-10-CM | POA: Diagnosis not present

## 2021-08-24 DIAGNOSIS — L821 Other seborrheic keratosis: Secondary | ICD-10-CM | POA: Diagnosis not present

## 2021-08-31 ENCOUNTER — Encounter: Payer: PPO | Admitting: Physical Therapy

## 2021-09-01 ENCOUNTER — Ambulatory Visit (INDEPENDENT_AMBULATORY_CARE_PROVIDER_SITE_OTHER): Payer: PPO

## 2021-09-01 DIAGNOSIS — I495 Sick sinus syndrome: Secondary | ICD-10-CM | POA: Diagnosis not present

## 2021-09-01 LAB — CUP PACEART REMOTE DEVICE CHECK
Battery Remaining Longevity: 50 mo
Battery Remaining Percentage: 36 %
Battery Voltage: 2.95 V
Brady Statistic RV Percent Paced: 16 %
Date Time Interrogation Session: 20221228034134
Implantable Lead Implant Date: 20130528
Implantable Lead Location: 753860
Implantable Lead Model: 1948
Implantable Pulse Generator Implant Date: 20130528
Lead Channel Impedance Value: 630 Ohm
Lead Channel Pacing Threshold Amplitude: 0.75 V
Lead Channel Pacing Threshold Pulse Width: 0.4 ms
Lead Channel Sensing Intrinsic Amplitude: 12 mV
Lead Channel Setting Pacing Amplitude: 2.5 V
Lead Channel Setting Pacing Pulse Width: 0.4 ms
Lead Channel Setting Sensing Sensitivity: 2 mV
Pulse Gen Model: 1210
Pulse Gen Serial Number: 7328888

## 2021-09-04 ENCOUNTER — Other Ambulatory Visit: Payer: Self-pay | Admitting: Family Medicine

## 2021-09-07 ENCOUNTER — Telehealth: Payer: Self-pay

## 2021-09-07 ENCOUNTER — Other Ambulatory Visit: Payer: Self-pay

## 2021-09-07 ENCOUNTER — Ambulatory Visit (HOSPITAL_BASED_OUTPATIENT_CLINIC_OR_DEPARTMENT_OTHER)
Admission: RE | Admit: 2021-09-07 | Discharge: 2021-09-07 | Disposition: A | Discharge status: Home / Self Care | Payer: PPO | Source: Ambulatory Visit | Attending: Adult Health | Admitting: Adult Health

## 2021-09-07 DIAGNOSIS — C61 Malignant neoplasm of prostate: Secondary | ICD-10-CM | POA: Diagnosis not present

## 2021-09-07 DIAGNOSIS — M85851 Other specified disorders of bone density and structure, right thigh: Secondary | ICD-10-CM | POA: Diagnosis not present

## 2021-09-07 DIAGNOSIS — Z13820 Encounter for screening for osteoporosis: Secondary | ICD-10-CM | POA: Diagnosis not present

## 2021-09-07 NOTE — Telephone Encounter (Signed)
Called pt, per Mendel Ryder, bone density shows mild osteopenia.  Recommended pt supplement calcium and vitamin D and include weightbearing exercises.  Pt verbalized understanding and thanks

## 2021-09-09 ENCOUNTER — Encounter: Payer: PPO | Admitting: Physical Therapy

## 2021-09-10 ENCOUNTER — Other Ambulatory Visit (INDEPENDENT_AMBULATORY_CARE_PROVIDER_SITE_OTHER): Payer: PPO

## 2021-09-10 ENCOUNTER — Other Ambulatory Visit: Payer: Self-pay | Admitting: Family Medicine

## 2021-09-10 DIAGNOSIS — R748 Abnormal levels of other serum enzymes: Secondary | ICD-10-CM | POA: Diagnosis not present

## 2021-09-10 DIAGNOSIS — E519 Thiamine deficiency, unspecified: Secondary | ICD-10-CM | POA: Diagnosis not present

## 2021-09-10 LAB — VITAMIN B12: Vitamin B-12: 1067 pg/mL — ABNORMAL HIGH (ref 211–911)

## 2021-09-12 ENCOUNTER — Encounter: Payer: Self-pay | Admitting: Family Medicine

## 2021-09-13 ENCOUNTER — Other Ambulatory Visit: Payer: Self-pay

## 2021-09-13 DIAGNOSIS — R748 Abnormal levels of other serum enzymes: Secondary | ICD-10-CM

## 2021-09-13 NOTE — Telephone Encounter (Signed)
Appointment set

## 2021-09-14 NOTE — Progress Notes (Signed)
Remote pacemaker transmission.   

## 2021-09-15 LAB — VITAMIN B1: Vitamin B1 (Thiamine): 27 nmol/L (ref 8–30)

## 2021-09-16 ENCOUNTER — Encounter: Payer: Self-pay | Admitting: Family Medicine

## 2021-09-16 ENCOUNTER — Encounter: Payer: PPO | Admitting: Physical Therapy

## 2021-09-24 DIAGNOSIS — C61 Malignant neoplasm of prostate: Secondary | ICD-10-CM | POA: Diagnosis not present

## 2021-09-24 DIAGNOSIS — R3121 Asymptomatic microscopic hematuria: Secondary | ICD-10-CM | POA: Diagnosis not present

## 2021-10-04 ENCOUNTER — Other Ambulatory Visit: Payer: Self-pay | Admitting: Family Medicine

## 2021-10-06 DIAGNOSIS — B351 Tinea unguium: Secondary | ICD-10-CM | POA: Diagnosis not present

## 2021-10-06 DIAGNOSIS — M79672 Pain in left foot: Secondary | ICD-10-CM | POA: Diagnosis not present

## 2021-10-06 DIAGNOSIS — M79671 Pain in right foot: Secondary | ICD-10-CM | POA: Diagnosis not present

## 2021-10-06 DIAGNOSIS — L84 Corns and callosities: Secondary | ICD-10-CM | POA: Diagnosis not present

## 2021-10-09 ENCOUNTER — Other Ambulatory Visit: Payer: Self-pay | Admitting: Family Medicine

## 2021-10-13 ENCOUNTER — Telehealth: Payer: Self-pay | Admitting: Pharmacist

## 2021-10-13 NOTE — Chronic Care Management (AMB) (Signed)
° ° °  Chronic Care Management Pharmacy Assistant   Name: SERGI GELLNER  MRN: [id number] DOB: [birth year]  Reason for Encounter: Disease State Hypertension   Recent office visits:  None noted  Recent consult visits:  08/24/21-(Internal Medicine)Elyshia Warden . Notes not available.  08/23/21-(Oncology)Natro D. Hulan Fray, RN .  08/13/21-(Cardiology) Thompson Grayer, MD Seen for routine electrophysiology follow up. Follow up in 12 months. 08/04/21-(Podiatry) Yaakov Guthrie. Notes not available.  08/03/21-(Internal Medicine) Deirdre Pippins. Notes not available.  Hospital visits:  None in previous 6 months  Medications: Outpatient Encounter Medications as of 10/13/2021  Medication Sig   Biotin 5000 MCG TABS Take 5,000 mg by mouth daily.   Calcium Carbonate-Vitamin D (CALTRATE 600+D PO) Take 600 mg by mouth daily.    Cholecalciferol 50 MCG (2000 UT) TABS Take 2,000 Units by mouth daily.    diltiazem (CARDIZEM CD) 240 MG 24 hr capsule TAKE ONE (1) CAPSULE EACH DAY BY MOUTH   fluticasone (FLONASE) 50 MCG/ACT nasal spray Use 2 spray(s) in each nostril once daily   gabapentin (NEURONTIN) 300 MG capsule Take 1 capsule by mouth twice daily   magnesium oxide (MAG-OX) 400 MG tablet Take 400 mg by mouth daily.   metoprolol tartrate (LOPRESSOR) 100 MG tablet Take 1 tablet by mouth twice daily   Multiple Vitamins-Minerals (MULTIVITAMIN WITH MINERALS) tablet Take 1 tablet by mouth daily.   rosuvastatin (CRESTOR) 10 MG tablet Take 1 tablet (10 mg total) by mouth daily.   Turmeric 500 MG TABS Take 500 mg by mouth daily.    XARELTO 20 MG TABS tablet Take 1 tablet by mouth once daily   No facility-administered encounter medications on file as of 10/13/2021.   Current antihypertensive regimen:  Metoprolol tartrate 100mg  twice daily Diltiazem 240mg  daily each afternoon  I have attempted to reach out to complete an assessment call. I have left 3 voicemail's to return phone call.  Adherence Review: Is the  patient currently on ACE/ARB medication? No Does the patient have >5 day gap between last estimated fill dates? No   Star Rating Drugs: Rosuvastatin 10 mg Last filled:07/13/21 90 DS  Myriam Elta [place], [place]

## 2021-10-14 ENCOUNTER — Other Ambulatory Visit: Payer: Self-pay

## 2021-10-14 MED ORDER — ROSUVASTATIN CALCIUM 10 MG PO TABS: 10.0000 mg | ORAL_TABLET | Freq: Every day | ORAL | 0 refills | Status: DC

## 2021-10-14 NOTE — Telephone Encounter (Signed)
Pt's medication was sent to pt's pharmacy as requested. Confirmation received.  °

## 2021-10-21 ENCOUNTER — Ambulatory Visit (INDEPENDENT_AMBULATORY_CARE_PROVIDER_SITE_OTHER): Payer: PPO | Admitting: Medical

## 2021-10-21 VITALS — BP 150/70 | HR 73 | Resp 18 | Ht 73.0 in | Wt 214.9 lb

## 2021-10-21 DIAGNOSIS — R7989 Other specified abnormal findings of blood chemistry: Secondary | ICD-10-CM

## 2021-10-21 DIAGNOSIS — R5383 Other fatigue: Secondary | ICD-10-CM

## 2021-10-21 DIAGNOSIS — R432 Parageusia: Secondary | ICD-10-CM

## 2021-10-21 DIAGNOSIS — I10 Essential (primary) hypertension: Secondary | ICD-10-CM | POA: Diagnosis not present

## 2021-10-21 LAB — CBC WITH DIFFERENTIAL/PLATELET
Basophils Absolute: 0.1 10*3/uL (ref 0.0–0.1)
Basophils Relative: 0.9 % (ref 0.0–3.0)
Eosinophils Absolute: 0.2 10*3/uL (ref 0.0–0.7)
Eosinophils Relative: 4.3 % (ref 0.0–5.0)
HCT: 41.7 % (ref 39.0–52.0)
Hemoglobin: 13.7 g/dL (ref 13.0–17.0)
Lymphocytes Relative: 14.1 % (ref 12.0–46.0)
Lymphs Abs: 0.8 10*3/uL (ref 0.7–4.0)
MCHC: 32.8 g/dL (ref 30.0–36.0)
MCV: 88.4 fl (ref 78.0–100.0)
Monocytes Absolute: 0.7 10*3/uL (ref 0.1–1.0)
Monocytes Relative: 12.8 % — ABNORMAL HIGH (ref 3.0–12.0)
Neutro Abs: 3.9 10*3/uL (ref 1.4–7.7)
Neutrophils Relative %: 67.9 % (ref 43.0–77.0)
Platelets: 153 10*3/uL (ref 150.0–400.0)
RBC: 4.71 Mil/uL (ref 4.22–5.81)
RDW: 15.1 % (ref 11.5–15.5)
WBC: 5.7 10*3/uL (ref 4.0–10.5)

## 2021-10-21 LAB — COMPREHENSIVE METABOLIC PANEL
ALT: 17 U/L (ref 0–53)
AST: 23 U/L (ref 0–37)
Albumin: 4 g/dL (ref 3.5–5.2)
Alkaline Phosphatase: 85 U/L (ref 39–117)
BUN: 24 mg/dL — ABNORMAL HIGH (ref 6–23)
CO2: 31 mEq/L (ref 19–32)
Calcium: 9.1 mg/dL (ref 8.4–10.5)
Chloride: 103 mEq/L (ref 96–112)
Creatinine, Ser: 1.36 mg/dL (ref 0.40–1.50)
GFR: 47.93 mL/min — ABNORMAL LOW (ref >60.00)
Glucose, Bld: 95 mg/dL (ref 70–99)
Potassium: 4 mEq/L (ref 3.5–5.1)
Sodium: 138 mEq/L (ref 135–145)
Total Bilirubin: 0.9 mg/dL (ref 0.2–1.2)
Total Protein: 7 g/dL (ref 6.0–8.3)

## 2021-10-21 LAB — VITAMIN B12: Vitamin B-12: 799 pg/mL (ref 211–911)

## 2021-10-21 LAB — IRON: Iron: 56 ug/dL (ref 42–165)

## 2021-10-21 NOTE — Patient Instructions (Addendum)
For altered taste and fatigue we will get B12, B1, cmpand iron level.  Considered other possibilities such as dehydration, medications, GERD, oral infection, salty mucus draining from sinuses and possibly denture related.  No obvious etiology so if lab work is negative will refer you to ENT.  Hypertension and atrial fibrillation.  Currently blood pressure is 150/70 when I checked.  Continue current medications and check blood pressure daily.  Would like to see it come back down to 140/90.  Recent lack of sleep and extreme stressful events likely contributing to mild BP elevation.  If by early next week blood pressure not coming down or if increasing let us know as in that event would need to add on BP medication.  Follow-up in 2 weeks or sooner if needed.

## 2021-10-21 NOTE — Progress Notes (Signed)
Subjective:    Patient ID: Brian Davidson, male    DOB: 07-Mar-1936, 86 y.o.   MRN: [id number]  HPI  Pt in for evaluation of stating he has altered taste. He states tastes like mouth full of salt. Back in July he lost his smell/taste for 2 weeks completley.   Pt feels like he got his taste and smell back complety. Then in January after he stopped multi vitamin and b1. He stopped some of those vitamins when levels were very high.  He continue vitamin D   He states altered taste happened between Chritmas and new year around time he got mild uri type illness. But does not describe severity like covid. He tested for covid and was negtive.  Pt states some mucus present early in morning when he blows his nose. No obvious sinus pain. Salty taste in mouth present all the time.   Bp was high initially. No cardiac or neurologic signs or symptoms. On recheck 150/70. Describes recent a lot of stress. Wife sister has been sick past 2 weeks. She passed away last night. Pt slept 5 hours last night.  No gerd. Pt feels like he is hydrated.   Review of Systems  Constitutional:  Negative for chills, fatigue and fever.  HENT:  Negative for congestion and drooling.   Eyes:  Negative for photophobia.  Respiratory:  Negative for cough, chest tightness, shortness of breath and wheezing.   Cardiovascular:  Negative for chest pain and palpitations.  Genitourinary:  Negative for enuresis, flank pain, frequency, genital sores and hematuria.    Past Medical History:  Diagnosis Date   Arthritis    Biliary dyskinesia    Carotid artery disease ([place]) 07/01/2016   Dyslipidemia 07/13/2017   Dysrhythmia    a fib   GERD (gastroesophageal reflux disease)    occasional   H/O measles    History of chicken pox    History of kidney stones    Hypertension    Neuromuscular disorder (HCC)    neuropathy feet    Persistent atrial fibrillation (HCC)    Pneumonia 1992   Presence of permanent cardiac pacemaker     Prostate cancer ([place])    Tachycardia-bradycardia ([place])    a. 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 He is active around the house and yard...   Parents are deceased and did not have cardiac issues but he has 2 siblings with atrial fibrillation and a sister-in-law  has a pacemaker.  Social Determinants of Health   Financial Resource Strain: Low Risk    Difficulty of Paying Living Expenses: Not hard at all  Food Insecurity: No Food Insecurity   Worried About Charity fundraiser in the Last Year: Never true   [place] in the Last Year: Never true  Transportation Needs: No Transportation Needs   Lack of Transportation (Medical): No   Lack of Transportation (Non-Medical): No  Physical Activity: Sufficiently Active   Days of Exercise per Week: 4 days   Minutes of Exercise per Session: 60 min  Stress: No Stress Concern Present   Feeling of Stress : Not at all  Social Connections: Moderately Isolated   Frequency of Communication with Friends and Family: Three times a week   Frequency of Social Gatherings with Friends and Family: Three times a week   Attends  Religious Services: Never   Active Member of Clubs or Organizations: No   Attends Archivist Meetings: Never   Marital Status: Married  Human resources officer Violence: Not At Risk   Fear of Current or Ex-Partner: No   Emotionally Abused: No   Physically Abused: No   Sexually Abused: No    Past Surgical History:  Procedure Laterality Date   CHOLECYSTECTOMY N/A 11/24/2015   Procedure: LAPAROSCOPIC CHOLECYSTECTOMY;  Surgeon: Greer Pickerel, MD;  Location: [facility];  Service: General;  Laterality: N/A;   CYSTOSCOPY  2016   EYE SURGERY     bil cataract   FRACTURE SURGERY  2005   left ankle   HERNIA REPAIR     left groin, no mesh   PERMANENT PACEMAKER INSERTION N/A 01/31/2012   SJM Accent SR RF implanted by DR Allred   TOTAL KNEE ARTHROPLASTY Right 12/23/2019   Procedure: TOTAL KNEE ARTHROPLASTY;  Surgeon: Gaynelle Arabian, MD;  Location: [facility];  Service: Orthopedics;  Laterality: Right;  57min    Family History  Problem Relation Age of Onset   Neuropathy Mother    Hypertension Mother    Arthritis Mother    Drug abuse Mother    Stroke Father    Heart disease Father    Prostate cancer Father        not treated   Heart disease Sister 106   Heart disease Sister    Heart disease Brother    Neuropathy Brother    Hypertension Daughter    Colon cancer Neg Hx     Allergies  Allergen Reactions   Cardizem [Diltiazem]     Rash from Yellow dye in generic capsule.   Can take different brand. Takes diltiazem - his allergy is to Cardizem   Sulfa Antibiotics     Unknown childhood reaction    Current Outpatient Medications on File Prior to Visit  Medication Sig Dispense Refill   Biotin 5000 MCG TABS Take 5,000 mg by mouth daily.     Calcium Carbonate-Vitamin D (CALTRATE 600+D PO) Take 600 mg by mouth daily.      Cholecalciferol 50 MCG (2000 UT) TABS Take 2,000 Units by mouth daily.      diltiazem (CARDIZEM CD) 240 MG 24 hr capsule TAKE ONE (1) CAPSULE EACH DAY BY MOUTH 90 capsule 1    fluticasone (FLONASE) 50 MCG/ACT nasal spray Use 2 spray(s) in each nostril once daily 16 g 5   gabapentin (NEURONTIN) 300 MG capsule Take 1 capsule by mouth twice daily 180 capsule 1   magnesium oxide (MAG-OX) 400 MG tablet Take 400 mg by mouth daily.  metoprolol tartrate (LOPRESSOR) 100 MG tablet Take 1 tablet by mouth twice daily 180 tablet 1   Multiple Vitamins-Minerals (MULTIVITAMIN WITH MINERALS) tablet Take 1 tablet by mouth daily. 30 tablet 3   rosuvastatin (CRESTOR) 10 MG tablet Take 1 tablet (10 mg total) by mouth daily. Please make yearly appt with Dr. Irish Lack for April 2023 for future refills. Thank you 1st attempt 90 tablet 0   Turmeric 500 MG TABS Take 500 mg by mouth daily.      XARELTO 20 MG TABS tablet Take 1 tablet by mouth once daily 90 tablet 0   No current facility-administered medications on file prior to visit.    BP (!) 170/78    Pulse 73    Resp 18    Ht 6\' 1"  (1.854 m)    Wt 214 lb 14.4 oz (97.5 kg)    SpO2 99%    BMI 28.35 kg/m       Objective:   Physical Exam General Mental Status- Alert. General Appearance- Not in acute distress.   Skin General: Color- Normal Color. Moisture- Normal Moisture.  Neck Carotid Arteries- Normal color. Moisture- Normal Moisture. No carotid bruits. No JVD.  Chest and Lung Exam Auscultation: Breath Sounds:-Normal.  Cardiovascular Auscultation:Rythm- Regular. Murmurs & Other Heart Sounds:Auscultation of the heart reveals- No Murmurs.  Abdomen Inspection:-Inspeection Normal. Palpation/Percussion:Note:No mass. Palpation and Percussion of the abdomen reveal- Non Tender, Non Distended + BS, no rebound or guarding.   Neurologic Cranial Nerve exam:- CN III-XII intact(No nystagmus), symmetric smile. Strength:- 5/5 equal and symmetric strength both upper and lower extremities.    HEENT-no frontal or maxillary sinus pressure palpation.  Mouth buccal mucosa looks normal.  Posterior pharynx normal and no lesions on a gum  area.  Tongue also normal.    Assessment & Plan:   Patient Instructions  For altered taste and fatigue we will get B12, B1, cmpand iron level.  Considered other possibilities such as dehydration, medications, GERD, oral infection, salty mucus draining from sinuses and possibly denture related.  No obvious etiology so if lab work is negative will refer you to ENT.  Hypertension and atrial fibrillation.  Currently blood pressure is 150/70 when I checked.  Continue current medications and check blood pressure daily.  Would like to see it come back down to 140/90.  Recent lack of sleep and extreme stressful events likely contributing to mild BP elevation.  If by early next week blood pressure not coming down or if increasing let us know as in that event would need to add on BP medication.  Follow-up in 2 weeks or sooner if needed.

## 2021-10-22 NOTE — Addendum Note (Signed)
Addended by: Anabel Halon on: 10/22/2021 02:02 PM   Modules accepted: Orders

## 2021-10-28 LAB — VITAMIN B1: Vitamin B1 (Thiamine): 9 nmol/L (ref 8–30)

## 2021-11-15 ENCOUNTER — Other Ambulatory Visit: Payer: PPO

## 2021-12-01 ENCOUNTER — Ambulatory Visit (INDEPENDENT_AMBULATORY_CARE_PROVIDER_SITE_OTHER): Payer: PPO

## 2021-12-01 DIAGNOSIS — I495 Sick sinus syndrome: Secondary | ICD-10-CM | POA: Diagnosis not present

## 2021-12-01 LAB — CUP PACEART REMOTE DEVICE CHECK
Battery Remaining Longevity: 48 mo
Battery Remaining Percentage: 35 %
Battery Voltage: 2.93 V
Brady Statistic RV Percent Paced: 18 %
Date Time Interrogation Session: 20230329030959
Implantable Lead Implant Date: 20130528
Implantable Lead Location: 753860
Implantable Lead Model: 1948
Implantable Pulse Generator Implant Date: 20130528
Lead Channel Impedance Value: 640 Ohm
Lead Channel Pacing Threshold Amplitude: 0.75 V
Lead Channel Pacing Threshold Pulse Width: 0.4 ms
Lead Channel Sensing Intrinsic Amplitude: 12 mV
Lead Channel Setting Pacing Amplitude: 2.5 V
Lead Channel Setting Pacing Pulse Width: 0.4 ms
Lead Channel Setting Sensing Sensitivity: 2 mV
Pulse Gen Model: 1210
Pulse Gen Serial Number: 7328888

## 2021-12-06 NOTE — Progress Notes (Deleted)
? ?Subjective:  ? ? Patient ID: Brian Davidson, male    DOB: 1936/05/09, 86 y.o.   MRN: [id number] ? ?No chief complaint on file. ? ? ?HPI ?Patient is in today for a follow up.  ? ?Past Medical History:  ?Diagnosis Date  ? Arthritis   ? Biliary dyskinesia   ? Carotid artery disease ([place]) 07/01/2016  ? Dyslipidemia 07/13/2017  ? Dysrhythmia   ? a fib  ? GERD (gastroesophageal reflux disease)   ? occasional  ? H/O measles   ? History of chicken pox   ? History of kidney stones   ? Hypertension   ? Neuromuscular disorder ([place])   ? neuropathy feet   ? Persistent atrial fibrillation ([place])   ? Pneumonia 1992  ? Presence of permanent cardiac pacemaker   ? Prostate cancer ([place])   ? Tachycardia-bradycardia [facility])   ? a. s/p STJ dual chamber pacemaker  ? Thiamine deficiency 01/19/2018  ? Valvular heart disease 07/01/2016  ? ? ?Past Surgical History:  ?Procedure Laterality Date  ? CHOLECYSTECTOMY N/A 11/24/2015  ? Procedure: LAPAROSCOPIC CHOLECYSTECTOMY;  Surgeon: Greer Pickerel, MD;  Location: [facility];  Service: General;  Laterality: N/A;  ? CYSTOSCOPY  2016  ? EYE SURGERY    ? bil cataract  ? FRACTURE SURGERY  2005  ? left ankle  ? HERNIA REPAIR    ? left groin, no mesh  ? PERMANENT PACEMAKER INSERTION N/A 01/31/2012  ? SJM Accent SR RF implanted by DR Allred  ? TOTAL KNEE ARTHROPLASTY Right 12/23/2019  ? Procedure: TOTAL KNEE ARTHROPLASTY;  Surgeon: Gaynelle Arabian, MD;  Location: [facility];  Service: Orthopedics;  Laterality: Right;  42mn  ? ? ?Family History  ?Problem Relation Age of Onset  ? Neuropathy Mother   ? Hypertension Mother   ? Arthritis Mother   ? Drug abuse Mother   ? Stroke Father   ? Heart disease Father   ? Prostate cancer Father   ?     not treated  ? Heart disease Sister 743 ? Heart disease Sister   ? Heart disease Brother   ? Neuropathy Brother   ? Hypertension Daughter   ? Colon cancer Neg Hx   ? ? ?Social History  ? ?Socioeconomic History  ? Marital status: Married  ?  Spouse name: [name] ? Number of children: 1   ? Years of education: Not on file  ? Highest education level: Not on file  ?Occupational History  ? Occupation: Retired  ? 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 Smoking status: Former  ?  Types: Pipe, Cigars  ?  Quit date: 11/18/1985  ?  Years since quitting: 36.0  ? Smokeless tobacco: Never  ?Vaping Use  ? Vaping Use: Never used  ?Substance and Sexual Activity  ? Alcohol use: Yes  ?  Alcohol/week: 3.0 standard drinks  ?  Types: 3 Cans of beer per week  ?  Comment: occ  ? Drug use: No  ? Sexual activity: Not Currently  ?Other Topics Concern  ? Not on file  ?Social History Narrative  ? Lives with wife, still works part-time as a dGeophysicist/field seismologist He is active around the house and yard...  ? Parents are deceased and did not have cardiac issues but he has 2 siblings with atrial fibrillation and a sister-in-law  has a pacemaker.  ? ?Social Determinants of Health  ? ?Financial Resource Strain: Low Risk   ? Difficulty of Paying Living Expenses:  Not hard at all  ?Food Insecurity: No Food Insecurity  ? Worried About Charity fundraiser in the Last Year: Never true  ? Ran Out of Food in the Last Year: Never true  ?Transportation Needs: No Transportation Needs  ? Lack of Transportation (Medical): No  ? Lack of Transportation (Non-Medical): No  ?Physical Activity: Sufficiently Active  ? Days of Exercise per Week: 4 days  ? Minutes of Exercise per Session: 60 min  ?Stress: No Stress Concern Present  ? Feeling of Stress : Not at all  ?Social Connections: Moderately Isolated  ? Frequency of Communication with Friends and Family: Three times a week  ? Frequency of Social Gatherings with Friends and Family: Three times a week  ? Attends Religious Services: Never  ? Active Member of Clubs or Organizations: No  ? Attends Archivist Meetings: Never  ? Marital Status: Married  ?Intimate Partner Violence: Not At Risk  ? Fear of Current or Ex-Partner: No  ? Emotionally Abused: No  ? Physically Abused: No  ?  Sexually Abused: No  ? ? ?Outpatient Medications Prior to Visit  ?Medication Sig Dispense Refill  ? Biotin 5000 MCG TABS Take 5,000 mg by mouth daily.    ? Calcium Carbonate-Vitamin D (CALTRATE 600+D PO) Take 600 mg by mouth daily.     ? Cholecalciferol 50 MCG (2000 UT) TABS Take 2,000 Units by mouth daily.     ? diltiazem (CARDIZEM CD) 240 MG 24 hr capsule TAKE ONE (1) CAPSULE EACH DAY BY MOUTH 90 capsule 1  ? fluticasone (FLONASE) 50 MCG/ACT nasal spray Use 2 spray(s) in each nostril once daily 16 g 5  ? gabapentin (NEURONTIN) 300 MG capsule Take 1 capsule by mouth twice daily 180 capsule 1  ? magnesium oxide (MAG-OX) 400 MG tablet Take 400 mg by mouth daily.    ? metoprolol tartrate (LOPRESSOR) 100 MG tablet Take 1 tablet by mouth twice daily 180 tablet 1  ? Multiple Vitamins-Minerals (MULTIVITAMIN WITH MINERALS) tablet Take 1 tablet by mouth daily. 30 tablet 3  ? rosuvastatin (CRESTOR) 10 MG tablet Take 1 tablet (10 mg total) by mouth daily. Please make yearly appt with Dr. Irish Lack for April 2023 for future refills. Thank you 1st attempt 90 tablet 0  ? Turmeric 500 MG TABS Take 500 mg by mouth daily.     ? XARELTO 20 MG TABS tablet Take 1 tablet by mouth once daily 90 tablet 0  ? ?No facility-administered medications prior to visit.  ? ? ?Allergies  ?Allergen Reactions  ? Cardizem [Diltiazem]   ?  Rash from Yellow dye in generic capsule.   Can take different brand. ?Takes diltiazem - his allergy is to Cardizem  ? Sulfa Antibiotics   ?  Unknown childhood reaction  ? ? ?ROS ? ?   ?Objective:  ?  ?Physical Exam ? ?There were no vitals taken for this visit. ?Wt Readings from Last 3 Encounters:  ?10/21/21 214 lb 14.4 oz (97.5 kg)  ?08/13/21 219 lb (99.3 kg)  ?06/08/21 220 lb 3.2 oz (99.9 kg)  ? ? ?Diabetic Foot Exam - Simple   ?No data filed ?  ? ?Lab Results  ?Component Value Date  ? WBC 5.7 10/21/2021  ? HGB 13.7 10/21/2021  ? HCT 41.7 10/21/2021  ? PLT 153.0 10/21/2021  ? GLUCOSE 95 10/21/2021  ? CHOL 102  06/08/2021  ? TRIG 85.0 06/08/2021  ? HDL 40.30 06/08/2021  ? LDLCALC 44 06/08/2021  ? ALT 17  10/21/2021  ? AST 23 10/21/2021  ? NA 138 10/21/2021  ? K 4.0 10/21/2021  ? CL 103 10/21/2021  ? CREATININE 1.36 10/21/2021  ? BUN 24 (H) 10/21/2021  ? CO2 31 10/21/2021  ? TSH 2.38 06/08/2021  ? INR 1.5 (H) 12/16/2019  ? HGBA1C 5.8 06/08/2021  ? ? ?Lab Results  ?Component Value Date  ? TSH 2.38 06/08/2021  ? ?Lab Results  ?Component Value Date  ? WBC 5.7 10/21/2021  ? HGB 13.7 10/21/2021  ? HCT 41.7 10/21/2021  ? MCV 88.4 10/21/2021  ? PLT 153.0 10/21/2021  ? ?Lab Results  ?Component Value Date  ? NA 138 10/21/2021  ? K 4.0 10/21/2021  ? CO2 31 10/21/2021  ? GLUCOSE 95 10/21/2021  ? BUN 24 (H) 10/21/2021  ? CREATININE 1.36 10/21/2021  ? BILITOT 0.9 10/21/2021  ? ALKPHOS 85 10/21/2021  ? AST 23 10/21/2021  ? ALT 17 10/21/2021  ? PROT 7.0 10/21/2021  ? ALBUMIN 4.0 10/21/2021  ? CALCIUM 9.1 10/21/2021  ? ANIONGAP 9 12/24/2019  ? GFR 47.93 (L) 10/21/2021  ? ?Lab Results  ?Component Value Date  ? CHOL 102 06/08/2021  ? ?Lab Results  ?Component Value Date  ? HDL 40.30 06/08/2021  ? ?Lab Results  ?Component Value Date  ? LDLCALC 44 06/08/2021  ? ?Lab Results  ?Component Value Date  ? TRIG 85.0 06/08/2021  ? ?Lab Results  ?Component Value Date  ? CHOLHDL 3 06/08/2021  ? ?Lab Results  ?Component Value Date  ? HGBA1C 5.8 06/08/2021  ? ? ?   ?Assessment & Plan:  ? ?Problem List Items Addressed This Visit   ?None ? ? ?I am having [name] "[name]" maintain his Turmeric, magnesium oxide, Cholecalciferol, Calcium Carbonate-Vitamin D (CALTRATE 600+D PO), Biotin, multivitamin with minerals, gabapentin, metoprolol tartrate, fluticasone, diltiazem, Xarelto, and rosuvastatin. ? ?No orders of the defined types were placed in this encounter. ? ? ? ? ?

## 2021-12-07 ENCOUNTER — Encounter: Payer: Self-pay | Admitting: Family Medicine

## 2021-12-07 ENCOUNTER — Ambulatory Visit (INDEPENDENT_AMBULATORY_CARE_PROVIDER_SITE_OTHER): Payer: PPO | Admitting: Family Medicine

## 2021-12-07 VITALS — BP 160/78 | HR 70 | Resp 20 | Ht 73.0 in | Wt 211.0 lb

## 2021-12-07 DIAGNOSIS — Z0000 Encounter for general adult medical examination without abnormal findings: Secondary | ICD-10-CM

## 2021-12-07 DIAGNOSIS — R319 Hematuria, unspecified: Secondary | ICD-10-CM

## 2021-12-07 DIAGNOSIS — R739 Hyperglycemia, unspecified: Secondary | ICD-10-CM | POA: Diagnosis not present

## 2021-12-07 DIAGNOSIS — I10 Essential (primary) hypertension: Secondary | ICD-10-CM | POA: Diagnosis not present

## 2021-12-07 DIAGNOSIS — E782 Mixed hyperlipidemia: Secondary | ICD-10-CM | POA: Diagnosis not present

## 2021-12-07 DIAGNOSIS — G6289 Other specified polyneuropathies: Secondary | ICD-10-CM

## 2021-12-07 DIAGNOSIS — M8580 Other specified disorders of bone density and structure, unspecified site: Secondary | ICD-10-CM

## 2021-12-07 LAB — URINALYSIS, ROUTINE W REFLEX MICROSCOPIC
Bilirubin Urine: NEGATIVE
Ketones, ur: NEGATIVE
Leukocytes,Ua: NEGATIVE
Nitrite: NEGATIVE
Specific Gravity, Urine: 1.015 (ref 1.000–1.030)
Total Protein, Urine: NEGATIVE
Urine Glucose: NEGATIVE
Urobilinogen, UA: 1 (ref 0.0–1.0)
pH: 7 (ref 5.0–8.0)

## 2021-12-07 LAB — LIPID PANEL
Cholesterol: 102 mg/dL (ref 0–200)
HDL: 42.4 mg/dL (ref >39.00)
LDL Cholesterol: 45 mg/dL (ref 0–99)
NonHDL: 59.75
Total CHOL/HDL Ratio: 2
Triglycerides: 75 mg/dL (ref 0.0–149.0)
VLDL: 15 mg/dL (ref 0.0–40.0)

## 2021-12-07 LAB — COMPREHENSIVE METABOLIC PANEL
ALT: 17 U/L (ref 0–53)
AST: 23 U/L (ref 0–37)
Albumin: 4.1 g/dL (ref 3.5–5.2)
Alkaline Phosphatase: 82 U/L (ref 39–117)
BUN: 18 mg/dL (ref 6–23)
CO2: 27 mEq/L (ref 19–32)
Calcium: 9.3 mg/dL (ref 8.4–10.5)
Chloride: 103 mEq/L (ref 96–112)
Creatinine, Ser: 1.25 mg/dL (ref 0.40–1.50)
GFR: 52.98 mL/min — ABNORMAL LOW (ref >60.00)
Glucose, Bld: 84 mg/dL (ref 70–99)
Potassium: 4.2 mEq/L (ref 3.5–5.1)
Sodium: 138 mEq/L (ref 135–145)
Total Bilirubin: 1 mg/dL (ref 0.2–1.2)
Total Protein: 6.9 g/dL (ref 6.0–8.3)

## 2021-12-07 LAB — HEMOGLOBIN A1C: Hgb A1c MFr Bld: 5.9 % (ref 4.6–6.5)

## 2021-12-07 LAB — TSH: TSH: 2.05 u[IU]/mL (ref 0.35–5.50)

## 2021-12-07 NOTE — Assessment & Plan Note (Signed)
hgba1c acceptable, minimize simple carbs. Increase exercise as tolerated.  

## 2021-12-07 NOTE — Progress Notes (Signed)
? ?Subjective:  ? ?By signing my name below, I, Brian Davidson, attest that this documentation has been prepared under the direction and in the presence of Brian Homans MD, 12/07/2021 ? ? Patient ID: Brian Davidson, male    DOB: 09/06/1935, 86 y.o.   MRN: [id number] ? ?Chief Complaint  ?Patient presents with  ? Follow-up  ? ? ?HPI ?Patient is in today for an office visit.  ? ?He reports no recent febrile illness or hospitalizations. Overall, he is doing well. ? ?He is requesting a refill of 20 MG of Xarelto.  ? ?Patient complains of persistent altered taste. The symptoms begun at the beginning of the year. He reports being diagnosed with COVID - 19 in 03/2021. He is planning to see a specialist for his symptoms.  ? ?As of today's visit, his blood pressure is elevating, a report of 160/78. He is taking a multivitamin. He is also taking 240 MG of Diltiazem during the evening and his previous concerned symptoms were resolved. He denies any changes to his sleep schedule.  ?BP Readings from Last 3 Encounters:  ?12/07/21 (!) 160/78  ?10/21/21 (!) 150/70  ?08/13/21 120/66  ? ?His vitamin levels are well. ?Lab Results  ?Component Value Date  ? [id number] 10/21/2021  ? ?He is UTD on vaccinations. ? ?He denies Headaches/CP/palp/SOB/HA/congestion/fevers/GI or GU c/o. He is taking his medications as prescribed  ? ? ?Past Medical History:  ?Diagnosis Date  ? Arthritis   ? Biliary dyskinesia   ? Carotid artery disease ([place]) 07/01/2016  ? Dyslipidemia 07/13/2017  ? Dysrhythmia   ? a fib  ? GERD (gastroesophageal reflux disease)   ? occasional  ? H/O measles   ? History of chicken pox   ? History of kidney stones   ? Hypertension   ? Neuromuscular disorder ([place])   ? neuropathy feet   ? Persistent atrial fibrillation ([place])   ? Pneumonia 1992  ? Presence of permanent cardiac pacemaker   ? Prostate cancer ([place])   ? Tachycardia-bradycardia [facility])   ? a. s/p STJ dual chamber pacemaker  ? Thiamine deficiency 01/19/2018  ? Valvular  heart disease 07/01/2016  ? ? ?Past Surgical History:  ?Procedure Laterality Date  ? CHOLECYSTECTOMY N/A 11/24/2015  ? Procedure: LAPAROSCOPIC CHOLECYSTECTOMY;  Surgeon: Greer Pickerel, MD;  Location: [facility];  Service: General;  Laterality: N/A;  ? CYSTOSCOPY  2016  ? EYE SURGERY    ? bil cataract  ? FRACTURE SURGERY  2005  ? left ankle  ? HERNIA REPAIR    ? left groin, no mesh  ? PERMANENT PACEMAKER INSERTION N/A 01/31/2012  ? SJM Accent SR RF implanted by DR Allred  ? TOTAL KNEE ARTHROPLASTY Right 12/23/2019  ? Procedure: TOTAL KNEE ARTHROPLASTY;  Surgeon: Gaynelle Arabian, MD;  Location: [facility];  Service: Orthopedics;  Laterality: Right;  86mn  ? ? ?Family History  ?Problem Relation Age of Onset  ? Neuropathy Mother   ? Hypertension Mother   ? Arthritis Mother   ? Drug abuse Mother   ? Stroke Father   ? Heart disease Father   ? Prostate cancer Father   ?     not treated  ? Heart disease Sister 771 ? Heart disease Sister   ? Heart disease Brother   ? Neuropathy Brother   ? Hypertension Daughter   ? Colon cancer Neg Hx   ? ? ?Social History  ? ?Socioeconomic History  ? Marital status: Married  ?  Spouse name: Brian Davidson ?  Number of children: 1  ? Years of education: Not on file  ? Highest education level: Not on file  ?Occupational History  ? Occupation: Retired  ? Occupation: Dance movement psychotherapist for [employer]  ?Tobacco Use  ? Smoking status: Former  ?  Types: Pipe, Cigars  ?  Quit date: 11/18/1985  ?  Years since quitting: 36.0  ? Smokeless tobacco: Never  ?Vaping Use  ? Vaping Use: Never used  ?Substance and Sexual Activity  ? Alcohol use: Yes  ?  Alcohol/week: 3.0 standard drinks  ?  Types: 3 Cans of beer per week  ?  Comment: occ  ? Drug use: No  ? Sexual activity: Not Currently  ?Other Topics Concern  ? Not on file  ?Social History Narrative  ? Lives with wife, still works part-time as a Geophysicist/field seismologist. He is active around the house and yard...  ? Parents are deceased and did not have cardiac issues but he has 2 siblings with  atrial fibrillation and a sister-in-law  has a pacemaker.  ? ?Social Determinants of Health  ? ?Financial Resource Strain: Low Risk   ? Difficulty of Paying Living Expenses: Not hard at all  ?Food Insecurity: No Food Insecurity  ? Worried About Charity fundraiser in the Last Year: Never true  ? Ran Out of Food in the Last Year: Never true  ?Transportation Needs: No Transportation Needs  ? Lack of Transportation (Medical): No  ? Lack of Transportation (Non-Medical): No  ?Physical Activity: Sufficiently Active  ? Days of Exercise per Week: 4 days  ? Minutes of Exercise per Session: 60 min  ?Stress: No Stress Concern Present  ? Feeling of Stress : Not at all  ?Social Connections: Moderately Isolated  ? Frequency of Communication with Friends and Family: Three times a week  ? Frequency of Social Gatherings with Friends and Family: Three times a week  ? Attends Religious Services: Never  ? Active Member of Clubs or Organizations: No  ? Attends Archivist Meetings: Never  ? Marital Status: Married  ?Intimate Partner Violence: Not At Risk  ? Fear of Current or Ex-Partner: No  ? Emotionally Abused: No  ? Physically Abused: No  ? Sexually Abused: No  ? ? ?Outpatient Medications Prior to Visit  ?Medication Sig Dispense Refill  ? Calcium Carbonate-Vitamin D (CALTRATE 600+D PO) Take 600 mg by mouth daily.     ? Cholecalciferol 50 MCG (2000 UT) TABS Take 2,000 Units by mouth daily.     ? diltiazem (CARDIZEM CD) 240 MG 24 hr capsule TAKE ONE (1) CAPSULE EACH DAY BY MOUTH 90 capsule 1  ? fluticasone (FLONASE) 50 MCG/ACT nasal spray Use 2 spray(s) in each nostril once daily 16 g 5  ? gabapentin (NEURONTIN) 300 MG capsule Take 1 capsule by mouth twice daily 180 capsule 1  ? metoprolol tartrate (LOPRESSOR) 100 MG tablet Take 1 tablet by mouth twice daily 180 tablet 1  ? Multiple Vitamins-Minerals (MULTIVITAMIN WITH MINERALS) tablet Take 1 tablet by mouth daily. 30 tablet 3  ? rosuvastatin (CRESTOR) 10 MG tablet Take 1  tablet (10 mg total) by mouth daily. Please make yearly appt with Dr. Irish Lack for April 2023 for future refills. Thank you 1st attempt 90 tablet 0  ? Turmeric 500 MG TABS Take 500 mg by mouth daily.     ? XARELTO 20 MG TABS tablet Take 1 tablet by mouth once daily 90 tablet 0  ? Biotin 5000 MCG TABS Take 5,000 mg  by mouth daily.    ? magnesium oxide (MAG-OX) 400 MG tablet Take 400 mg by mouth daily.    ? ?No facility-administered medications prior to visit.  ? ? ?Allergies  ?Allergen Reactions  ? Cardizem [Diltiazem]   ?  Rash from Yellow dye in generic capsule.   Can take different brand. ?Takes diltiazem - his allergy is to Cardizem  ? Sulfa Antibiotics   ?  Unknown childhood reaction  ? ? ?Review of Systems  ?Constitutional:  Negative for fever.  ?HENT:  Negative for congestion.   ?Respiratory:  Negative for shortness of breath.   ?Cardiovascular:  Negative for chest pain and palpitations.  ?Gastrointestinal: Negative.   ?Genitourinary: Negative.   ?Neurological:  Negative for headaches.  ? ?   ?Objective:  ?  ?Physical Exam ?Constitutional:   ?   General: He is not in acute distress. ?   Appearance: Normal appearance. He is not ill-appearing.  ?HENT:  ?   Head: Normocephalic and atraumatic.  ?   Right Ear: External ear normal.  ?   Left Ear: External ear normal.  ?Eyes:  ?   Extraocular Movements: Extraocular movements intact.  ?   Pupils: Pupils are equal, round, and reactive to light.  ?Cardiovascular:  ?   Rate and Rhythm: Normal rate and regular rhythm.  ?   Heart sounds: Normal heart sounds. No murmur heard. ?  No gallop.  ?Pulmonary:  ?   Effort: Pulmonary effort is normal. No respiratory distress.  ?   Breath sounds: Normal breath sounds. No wheezing or rales.  ?Skin: ?   General: Skin is warm and dry.  ?Neurological:  ?   Mental Status: He is alert and oriented to person, place, and time.  ?Psychiatric:     ?   Judgment: Judgment normal.  ? ? ?BP (!) 160/78   Pulse 70   Resp 20   Ht '6\' 1"'$  (1.854 m)    Wt 211 lb (95.7 kg)   SpO2 98%   BMI 27.84 kg/m?  ?Wt Readings from Last 3 Encounters:  ?12/07/21 211 lb (95.7 kg)  ?10/21/21 214 lb 14.4 oz (97.5 kg)  ?08/13/21 219 lb (99.3 kg)  ? ? ?Diabetic Foot Exam - Simple

## 2021-12-07 NOTE — Assessment & Plan Note (Signed)
Encourage heart healthy diet such as MIND or DASH diet, increase exercise, avoid trans fats, simple carbohydrates and processed foods, consider a krill or fish or flaxseed oil cap daily.  Tolerating Rosuvastatin 

## 2021-12-07 NOTE — Assessment & Plan Note (Signed)
Is following with urology and while he is asymptomatic but will repeat urinalysis today so he can follow up with urology ?

## 2021-12-07 NOTE — Assessment & Plan Note (Addendum)
Running high recently often in the 601V systolic at home. Despite this fact he feels well. Will check cmp today and if renal functions good will add Losartan 25 mg daily. Repeat bp and cmp in 1 month. Renal ultrasound ordered today to further investigate.  ?

## 2021-12-07 NOTE — Patient Instructions (Addendum)
Call ENT, Dr Benjamine Mola for appointment ?Call cardiology, Dr Irish Lack for follow up ? ?Hypertension, Adult ?High blood pressure (hypertension) is when the force of blood pumping through the arteries is too strong. The arteries are the blood vessels that carry blood from the heart throughout the body. Hypertension forces the heart to work harder to pump blood and may cause arteries to become narrow or stiff. Untreated or uncontrolled hypertension can cause a heart attack, heart failure, a stroke, kidney disease, and other problems. ?A blood pressure reading consists of a higher number over a lower number. Ideally, your blood pressure should be below 120/80. The first ("top") number is called the systolic pressure. It is a measure of the pressure in your arteries as your heart beats. The second ("bottom") number is called the diastolic pressure. It is a measure of the pressure in your arteries as the heart relaxes. ?What are the causes? ?The exact cause of this condition is not known. There are some conditions that result in or are related to high blood pressure. ?What increases the risk? ?Some risk factors for high blood pressure are under your control. The following factors may make you more likely to develop this condition: ?Smoking. ?Having type 2 diabetes mellitus, high cholesterol, or both. ?Not getting enough exercise or physical activity. ?Being overweight. ?Having too much fat, sugar, calories, or salt (sodium) in your diet. ?Drinking too much alcohol. ?Some risk factors for high blood pressure may be difficult or impossible to change. Some of these factors include: ?Having chronic kidney disease. ?Having a family history of high blood pressure. ?Age. Risk increases with age. ?Race. You may be at higher risk if you are African American. ?Gender. Men are at higher risk than women before age 48. After age 77, women are at higher risk than men. ?Having obstructive sleep apnea. ?Stress. ?What are the signs or  symptoms? ?High blood pressure may not cause symptoms. Very high blood pressure (hypertensive crisis) may cause: ?Headache. ?Anxiety. ?Shortness of breath. ?Nosebleed. ?Nausea and vomiting. ?Vision changes. ?Severe chest pain. ?Seizures. ?How is this diagnosed? ?This condition is diagnosed by measuring your blood pressure while you are seated, with your arm resting on a flat surface, your legs uncrossed, and your feet flat on the floor. The cuff of the blood pressure monitor will be placed directly against the skin of your upper arm at the level of your heart. It should be measured at least twice using the same arm. Certain conditions can cause a difference in blood pressure between your right and left arms. ?Certain factors can cause blood pressure readings to be lower or higher than normal for a short period of time: ?When your blood pressure is higher when you are in a health care provider's office than when you are at home, this is called white coat hypertension. Most people with this condition do not need medicines. ?When your blood pressure is higher at home than when you are in a health care provider's office, this is called masked hypertension. Most people with this condition may need medicines to control blood pressure. ?If you have a high blood pressure reading during one visit or you have normal blood pressure with other risk factors, you may be asked to: ?Return on a different day to have your blood pressure checked again. ?Monitor your blood pressure at home for 1 week or longer. ?If you are diagnosed with hypertension, you may have other blood or imaging tests to help your health care provider understand your overall  risk for other conditions. ?How is this treated? ?This condition is treated by making healthy lifestyle changes, such as eating healthy foods, exercising more, and reducing your alcohol intake. Your health care provider may prescribe medicine if lifestyle changes are not enough to get your  blood pressure under control, and if: ?Your systolic blood pressure is above 130. ?Your diastolic blood pressure is above 80. ?Your personal target blood pressure may vary depending on your medical conditions, your age, and other factors. ?Follow these instructions at home: ?Eating and drinking ? ?Eat a diet that is high in fiber and potassium, and low in sodium, added sugar, and fat. An example eating plan is called the DASH (Dietary Approaches to Stop Hypertension) diet. To eat this way: ?Eat plenty of fresh fruits and vegetables. Try to fill one half of your plate at each meal with fruits and vegetables. ?Eat whole grains, such as whole-wheat pasta, brown rice, or whole-grain bread. Fill about one fourth of your plate with whole grains. ?Eat or drink low-fat dairy products, such as skim milk or low-fat yogurt. ?Avoid fatty cuts of meat, processed or cured meats, and poultry with skin. Fill about one fourth of your plate with lean proteins, such as fish, chicken without skin, beans, eggs, or tofu. ?Avoid pre-made and processed foods. These tend to be higher in sodium, added sugar, and fat. ?Reduce your daily sodium intake. Most people with hypertension should eat less than 1,500 mg of sodium a day. ?Do not drink alcohol if: ?Your health care provider tells you not to drink. ?You are pregnant, may be pregnant, or are planning to become pregnant. ?If you drink alcohol: ?Limit how much you use to: ?0-1 drink a day for women. ?0-2 drinks a day for men. ?Be aware of how much alcohol is in your drink. In the [place] one drink equals one 12 oz bottle of beer (355 mL), one 5 oz glass of wine (148 mL), or one 1? oz glass of hard liquor (44 mL). ?Lifestyle ? ?Work with your health care provider to maintain a healthy body weight or to lose weight. Ask what an ideal weight is for you. ?Get at least 30 minutes of exercise most days of the week. Activities may include walking, swimming, or biking. ?Include exercise to strengthen  your muscles (resistance exercise), such as Pilates or lifting weights, as part of your weekly exercise routine. Try to do these types of exercises for 30 minutes at least 3 days a week. ?Do not use any products that contain nicotine or tobacco, such as cigarettes, e-cigarettes, and chewing tobacco. If you need help quitting, ask your health care provider. ?Monitor your blood pressure at home as told by your health care provider. ?Keep all follow-up visits as told by your health care provider. This is important. ?Medicines ?Take over-the-counter and prescription medicines only as told by your health care provider. Follow directions carefully. Blood pressure medicines must be taken as prescribed. ?Do not skip doses of blood pressure medicine. Doing this puts you at risk for problems and can make the medicine less effective. ?Ask your health care provider about side effects or reactions to medicines that you should watch for. ?Contact a health care provider if you: ?Think you are having a reaction to a medicine you are taking. ?Have headaches that keep coming back (recurring). ?Feel dizzy. ?Have swelling in your ankles. ?Have trouble with your vision. ?Get help right away if you: ?Develop a severe headache or confusion. ?Have unusual weakness or numbness. ?  Feel faint. ?Have severe pain in your chest or abdomen. ?Vomit repeatedly. ?Have trouble breathing. ?Summary ?Hypertension is when the force of blood pumping through your arteries is too strong. If this condition is not controlled, it may put you at risk for serious complications. ?Your personal target blood pressure may vary depending on your medical conditions, your age, and other factors. For most people, a normal blood pressure is less than 120/80. ?Hypertension is treated with lifestyle changes, medicines, or a combination of both. Lifestyle changes include losing weight, eating a healthy, low-sodium diet, exercising more, and limiting alcohol. ?This  information is not intended to replace advice given to you by your health care provider. Make sure you discuss any questions you have with your health care provider. ?Document Revised: 05/02/2018 Document Reviewed: 08/

## 2021-12-08 DIAGNOSIS — B351 Tinea unguium: Secondary | ICD-10-CM | POA: Diagnosis not present

## 2021-12-08 DIAGNOSIS — L84 Corns and callosities: Secondary | ICD-10-CM | POA: Diagnosis not present

## 2021-12-08 DIAGNOSIS — M79672 Pain in left foot: Secondary | ICD-10-CM | POA: Diagnosis not present

## 2021-12-08 DIAGNOSIS — M79671 Pain in right foot: Secondary | ICD-10-CM | POA: Diagnosis not present

## 2021-12-08 LAB — URINE CULTURE
MICRO NUMBER:: 13219991
SPECIMEN QUALITY:: ADEQUATE

## 2021-12-14 NOTE — Progress Notes (Signed)
Remote pacemaker transmission.   

## 2021-12-16 ENCOUNTER — Telehealth: Payer: Self-pay

## 2021-12-16 ENCOUNTER — Other Ambulatory Visit: Payer: Self-pay | Admitting: Family Medicine

## 2021-12-16 MED ORDER — LOSARTAN POTASSIUM 25 MG PO TABS: 25.0000 mg | ORAL_TABLET | Freq: Every day | ORAL | 2 refills | Status: DC

## 2021-12-16 NOTE — Telephone Encounter (Signed)
Per your note on 4/4, did you want to start him on Losartan '25mg'$  with repeat bp and cmp in a month?  ?

## 2021-12-17 ENCOUNTER — Other Ambulatory Visit: Payer: Self-pay

## 2021-12-17 DIAGNOSIS — I10 Essential (primary) hypertension: Secondary | ICD-10-CM

## 2021-12-17 NOTE — Telephone Encounter (Signed)
Repeat CMP ordered for 01/10/22 when pt has nurse visit and lab appointment. ?

## 2021-12-20 DIAGNOSIS — L821 Other seborrheic keratosis: Secondary | ICD-10-CM | POA: Diagnosis not present

## 2021-12-20 DIAGNOSIS — L814 Other melanin hyperpigmentation: Secondary | ICD-10-CM | POA: Diagnosis not present

## 2021-12-20 DIAGNOSIS — L820 Inflamed seborrheic keratosis: Secondary | ICD-10-CM | POA: Diagnosis not present

## 2021-12-20 DIAGNOSIS — X32XXXS Exposure to sunlight, sequela: Secondary | ICD-10-CM | POA: Diagnosis not present

## 2021-12-26 ENCOUNTER — Other Ambulatory Visit: Payer: Self-pay

## 2021-12-26 ENCOUNTER — Emergency Department (HOSPITAL_BASED_OUTPATIENT_CLINIC_OR_DEPARTMENT_OTHER): Payer: PPO

## 2021-12-26 ENCOUNTER — Emergency Department (HOSPITAL_BASED_OUTPATIENT_CLINIC_OR_DEPARTMENT_OTHER)
Admission: EM | Admit: 2021-12-26 | Discharge: 2021-12-26 | Disposition: A | Discharge status: Home / Self Care | Payer: PPO | Attending: Emergency Medicine | Admitting: Emergency Medicine

## 2021-12-26 ENCOUNTER — Encounter (HOSPITAL_BASED_OUTPATIENT_CLINIC_OR_DEPARTMENT_OTHER): Payer: Self-pay | Admitting: Emergency Medicine

## 2021-12-26 DIAGNOSIS — S0993XA Unspecified injury of face, initial encounter: Secondary | ICD-10-CM | POA: Diagnosis present

## 2021-12-26 DIAGNOSIS — R7989 Other specified abnormal findings of blood chemistry: Secondary | ICD-10-CM | POA: Diagnosis not present

## 2021-12-26 DIAGNOSIS — S80211A Abrasion, right knee, initial encounter: Secondary | ICD-10-CM | POA: Insufficient documentation

## 2021-12-26 DIAGNOSIS — S80212A Abrasion, left knee, initial encounter: Secondary | ICD-10-CM | POA: Insufficient documentation

## 2021-12-26 DIAGNOSIS — S60512A Abrasion of left hand, initial encounter: Secondary | ICD-10-CM | POA: Insufficient documentation

## 2021-12-26 DIAGNOSIS — I517 Cardiomegaly: Secondary | ICD-10-CM | POA: Diagnosis not present

## 2021-12-26 DIAGNOSIS — S2241XA Multiple fractures of ribs, right side, initial encounter for closed fracture: Secondary | ICD-10-CM | POA: Diagnosis not present

## 2021-12-26 DIAGNOSIS — W19XXXA Unspecified fall, initial encounter: Secondary | ICD-10-CM | POA: Insufficient documentation

## 2021-12-26 DIAGNOSIS — M4802 Spinal stenosis, cervical region: Secondary | ICD-10-CM | POA: Diagnosis not present

## 2021-12-26 DIAGNOSIS — S0292XA Unspecified fracture of facial bones, initial encounter for closed fracture: Secondary | ICD-10-CM | POA: Diagnosis not present

## 2021-12-26 DIAGNOSIS — S0081XA Abrasion of other part of head, initial encounter: Secondary | ICD-10-CM | POA: Diagnosis not present

## 2021-12-26 DIAGNOSIS — S0501XA Injury of conjunctiva and corneal abrasion without foreign body, right eye, initial encounter: Secondary | ICD-10-CM | POA: Insufficient documentation

## 2021-12-26 DIAGNOSIS — R0781 Pleurodynia: Secondary | ICD-10-CM | POA: Diagnosis not present

## 2021-12-26 DIAGNOSIS — Z79899 Other long term (current) drug therapy: Secondary | ICD-10-CM | POA: Insufficient documentation

## 2021-12-26 DIAGNOSIS — Z7901 Long term (current) use of anticoagulants: Secondary | ICD-10-CM | POA: Insufficient documentation

## 2021-12-26 DIAGNOSIS — T148XXA Other injury of unspecified body region, initial encounter: Secondary | ICD-10-CM

## 2021-12-26 DIAGNOSIS — Y93K1 Activity, walking an animal: Secondary | ICD-10-CM | POA: Insufficient documentation

## 2021-12-26 DIAGNOSIS — S60511A Abrasion of right hand, initial encounter: Secondary | ICD-10-CM | POA: Insufficient documentation

## 2021-12-26 DIAGNOSIS — S0240EA Zygomatic fracture, right side, initial encounter for closed fracture: Secondary | ICD-10-CM | POA: Diagnosis not present

## 2021-12-26 DIAGNOSIS — S0181XA Laceration without foreign body of other part of head, initial encounter: Secondary | ICD-10-CM | POA: Diagnosis not present

## 2021-12-26 DIAGNOSIS — R0789 Other chest pain: Secondary | ICD-10-CM | POA: Diagnosis not present

## 2021-12-26 DIAGNOSIS — S61421A Laceration with foreign body of right hand, initial encounter: Secondary | ICD-10-CM | POA: Diagnosis not present

## 2021-12-26 DIAGNOSIS — S61411A Laceration without foreign body of right hand, initial encounter: Secondary | ICD-10-CM

## 2021-12-26 DIAGNOSIS — S199XXA Unspecified injury of neck, initial encounter: Secondary | ICD-10-CM | POA: Diagnosis not present

## 2021-12-26 DIAGNOSIS — I672 Cerebral atherosclerosis: Secondary | ICD-10-CM | POA: Diagnosis not present

## 2021-12-26 DIAGNOSIS — S0240CA Maxillary fracture, right side, initial encounter for closed fracture: Secondary | ICD-10-CM | POA: Diagnosis not present

## 2021-12-26 DIAGNOSIS — M7981 Nontraumatic hematoma of soft tissue: Secondary | ICD-10-CM | POA: Diagnosis not present

## 2021-12-26 DIAGNOSIS — I6529 Occlusion and stenosis of unspecified carotid artery: Secondary | ICD-10-CM | POA: Diagnosis not present

## 2021-12-26 LAB — TROPONIN I (HIGH SENSITIVITY): Troponin I (High Sensitivity): 7 ng/L (ref <18)

## 2021-12-26 LAB — CBC WITH DIFFERENTIAL/PLATELET
Abs Immature Granulocytes: 0.04 10*3/uL (ref 0.00–0.07)
Basophils Absolute: 0.1 10*3/uL (ref 0.0–0.1)
Basophils Relative: 1 %
Eosinophils Absolute: 0.1 10*3/uL (ref 0.0–0.5)
Eosinophils Relative: 1 %
HCT: 41.5 % (ref 39.0–52.0)
Hemoglobin: 13.9 g/dL (ref 13.0–17.0)
Immature Granulocytes: 1 %
Lymphocytes Relative: 10 %
Lymphs Abs: 0.7 10*3/uL (ref 0.7–4.0)
MCH: 30.3 pg (ref 26.0–34.0)
MCHC: 33.5 g/dL (ref 30.0–36.0)
MCV: 90.6 fL (ref 80.0–100.0)
Monocytes Absolute: 0.8 10*3/uL (ref 0.1–1.0)
Monocytes Relative: 11 %
Neutro Abs: 5.5 10*3/uL (ref 1.7–7.7)
Neutrophils Relative %: 76 %
Platelets: 137 10*3/uL — ABNORMAL LOW (ref 150–400)
RBC: 4.58 MIL/uL (ref 4.22–5.81)
RDW: 14.6 % (ref 11.5–15.5)
WBC: 7.2 10*3/uL (ref 4.0–10.5)
nRBC: 0 % (ref 0.0–0.2)

## 2021-12-26 LAB — BASIC METABOLIC PANEL
Anion gap: 10 (ref 5–15)
BUN: 26 mg/dL — ABNORMAL HIGH (ref 8–23)
CO2: 20 mmol/L — ABNORMAL LOW (ref 22–32)
Calcium: 8.6 mg/dL — ABNORMAL LOW (ref 8.9–10.3)
Chloride: 108 mmol/L (ref 98–111)
Creatinine, Ser: 1.21 mg/dL (ref 0.61–1.24)
GFR, Estimated: 59 mL/min — ABNORMAL LOW (ref >60)
Glucose, Bld: 124 mg/dL — ABNORMAL HIGH (ref 70–99)
Potassium: 4 mmol/L (ref 3.5–5.1)
Sodium: 138 mmol/L (ref 135–145)

## 2021-12-26 LAB — CBG MONITORING, ED: Glucose-Capillary: 119 mg/dL — ABNORMAL HIGH (ref 70–99)

## 2021-12-26 LAB — BRAIN NATRIURETIC PEPTIDE: B Natriuretic Peptide: 325 pg/mL — ABNORMAL HIGH (ref 0.0–100.0)

## 2021-12-26 MED ORDER — ONDANSETRON HCL 4 MG/2ML IJ SOLN
4.0000 mg | Freq: Once | INTRAMUSCULAR | Status: AC
Start: 2021-12-26 — End: 2021-12-26
  Administered 2021-12-26: 4 mg via INTRAVENOUS
  Filled 2021-12-26: qty 2

## 2021-12-26 MED ORDER — BACITRACIN ZINC 500 UNIT/GM EX OINT
TOPICAL_OINTMENT | Freq: Two times a day (BID) | CUTANEOUS | Status: DC
  Filled 2021-12-26: qty 28.35

## 2021-12-26 MED ORDER — LIDOCAINE HCL (PF) 1 % IJ SOLN
5.0000 mL | Freq: Once | INTRAMUSCULAR | Status: DC
  Filled 2021-12-26: qty 5

## 2021-12-26 NOTE — ED Triage Notes (Signed)
Pt arrives pov, slow gait to triage, report unwitnessed fall in triage. Endorses thinners, no memory of fall. Hematoma to right side of head, abrasion. Also reports right hand pain and bilateral foot pain. Right rib pain. Denies blurred vision, denies n/v. Endorses HA  ?

## 2021-12-26 NOTE — Discharge Instructions (Addendum)
Follow-up with oral maxillofacial surgery.  Phone number for oral surgery was provided.  Call to make an appointment. ? ?Follow-up with your primary care doctor as well given your fall. ? ?Return in 10 days to remove the sutures. ? ?Return immediately if you have recurrent symptoms, fevers pain or any additional concerns.  Otherwise do not drive for the next 2 weeks. ?

## 2021-12-26 NOTE — ED Notes (Signed)
[employer] rep at bedside to interrogate pacemaker ?

## 2021-12-26 NOTE — ED Provider Notes (Signed)
?[facility] EMERGENCY DEPARTMENT ?Provider Note ? ? ?CSN: [id number] ?Arrival date & time: 12/26/21  1037 ? ?  ? ?History ? ?Chief Complaint  ?Patient presents with  ? Fall  ? ? ?Brian Davidson is a 86 y.o. male. ? ?Patient presents to ER chief complaint of fall, lapse of consciousness.  Complaining of headache and right-sided chest pain.  He states he was out walking his dog and he does not recall what else happened.  He was able to walk back home and sat down on the porch where his wife found him bloodied.  He explained that he thinks he might of fallen but does not recall the event.  He is not sure if he passed out or not.  Denies fevers or cough or vomiting or diarrhea.  Denies any chest pain or abdominal pain other than the right chest rib area. ? ? ?  ? ?Home Medications ?Prior to Admission medications   ?Medication Sig Start Date End Date Taking? Authorizing Provider  ?Calcium Carbonate-Vitamin D (CALTRATE 600+D PO) Take 600 mg by mouth daily.     [provider]  ?Cholecalciferol 50 MCG (2000 UT) TABS Take 2,000 Units by mouth daily.     [provider]  ?diltiazem (CARDIZEM CD) 240 MG 24 hr capsule TAKE ONE (1) CAPSULE EACH DAY BY MOUTH 10/04/21   Mosie Lukes, MD  ?fluticasone Asencion Islam) 50 MCG/ACT nasal spray Use 2 spray(s) in each nostril once daily 09/10/21   Mosie Lukes, MD  ?gabapentin (NEURONTIN) 300 MG capsule Take 1 capsule by mouth twice daily 08/23/21   Mosie Lukes, MD  ?losartan (COZAAR) 25 MG tablet Take 1 tablet (25 mg total) by mouth daily. 12/16/21   Mosie Lukes, MD  ?metoprolol tartrate (LOPRESSOR) 100 MG tablet Take 1 tablet by mouth twice daily 09/06/21   Mosie Lukes, MD  ?Multiple Vitamins-Minerals (MULTIVITAMIN WITH MINERALS) tablet Take 1 tablet by mouth daily. 03/16/21   Mosie Lukes, MD  ?rosuvastatin (CRESTOR) 10 MG tablet Take 1 tablet (10 mg total) by mouth daily. Please make yearly appt with Dr. Irish Lack for April 2023 for future  refills. Thank you 1st attempt 10/14/21   Jettie Booze, MD  ?Turmeric 500 MG TABS Take 500 mg by mouth daily.     [provider]  ?Alveda Reasons 20 MG TABS tablet Take 1 tablet by mouth once daily 10/11/21   Mosie Lukes, MD  ?   ? ?Allergies    ?Cardizem [diltiazem] and Sulfa antibiotics   ? ?Review of Systems   ?Review of Systems  ?Constitutional:  Negative for fever.  ?HENT:  Negative for ear pain and sore throat.   ?Eyes:  Negative for pain.  ?Respiratory:  Negative for cough.   ?Gastrointestinal:  Negative for abdominal pain.  ?Genitourinary:  Negative for flank pain.  ?Musculoskeletal:  Negative for back pain.  ?Skin:  Negative for color change and rash.  ?Neurological:  Negative for weakness.  ?All other systems reviewed and are negative. ? ?Physical Exam ?Updated Vital Signs ?BP (!) 175/109   Pulse 94   Temp 98.2 ?F (36.8 ?C) (Oral)   Resp 20   Ht 6' (1.829 m)   Wt 93.9 kg   SpO2 99%   BMI 28.07 kg/m?  ?Physical Exam ?Constitutional:   ?   Appearance: He is well-developed.  ?HENT:  ?   Head:  ?   Comments: Abrasion to the right supraorbital region with tenderness in that  region, no gross deformity noted. ?   Nose: Nose normal.  ?Eyes:  ?   Extraocular Movements: Extraocular movements intact.  ?Cardiovascular:  ?   Rate and Rhythm: Normal rate.  ?Pulmonary:  ?   Effort: Pulmonary effort is normal.  ?Skin: ?   Coloration: Skin is not jaundiced.  ?   Comments: Multiple skin abrasions on bilateral knees and palms of the hands.  He does have a laceration on the right palmar surface ulnar aspect of the hand approximately 3 cm angulated.  ?Neurological:  ?   Mental Status: He is alert. Mental status is at baseline.  ? ? ?ED Results / Procedures / Treatments   ?Labs ?(all labs ordered are listed, but only abnormal results are displayed) ?Labs Reviewed  ?CBC WITH DIFFERENTIAL/PLATELET - Abnormal; Notable for the following components:  ?    Result Value  ? Platelets 137 (*)   ? All other components  within normal limits  ?BASIC METABOLIC PANEL - Abnormal; Notable for the following components:  ? CO2 20 (*)   ? Glucose, Bld 124 (*)   ? BUN 26 (*)   ? Calcium 8.6 (*)   ? GFR, Estimated 59 (*)   ? All other components within normal limits  ?BRAIN NATRIURETIC PEPTIDE - Abnormal; Notable for the following components:  ? B Natriuretic Peptide 325.0 (*)   ? All other components within normal limits  ?CBG MONITORING, ED - Abnormal; Notable for the following components:  ? Glucose-Capillary 119 (*)   ? All other components within normal limits  ?TROPONIN I (HIGH SENSITIVITY)  ? ? ?EKG ?EKG Interpretation ? ?Date/Time:  Sunday December 26 2021 11:34:52 EDT ?Ventricular Rate:  89 ?PR Interval:    ?QRS Duration: 90 ?QT Interval:  372 ?QTC Calculation: 453 ?R Axis:   10 ?Text Interpretation: Atrial fibrillation Ventricular premature complex Borderline low voltage, extremity leads Confirmed by Thamas Jaegers ([id number]) on 12/26/2021 1:01:09 PM ? ?Radiology ?DG Ribs Unilateral W/Chest Right ? ?Result Date: 12/26/2021 ?CLINICAL DATA:  Acute RIGHT chest and rib pain following fall. Initial encounter. EXAM: RIGHT RIBS AND CHEST - 3+ VIEW COMPARISON:  08/04/2020 and prior studies FINDINGS: Cardiomegaly and LEFT-sided single lead pacemaker again noted. There is no evidence of focal airspace disease, pulmonary edema, suspicious pulmonary nodule/mass, pleural effusion, or pneumothorax. No acute bony abnormalities are identified. IMPRESSION: Cardiomegaly without evidence of acute cardiopulmonary disease. No evidence of acute rib fracture on this study. Electronically Signed   By: Margarette Canada M.D.   On: 12/26/2021 14:02  ? ?CT Head Wo Contrast ? ?Result Date: 12/26/2021 ?CLINICAL DATA:  86 year old male with acute head and neck injury following fall. Initial encounter. EXAM: CT HEAD WITHOUT CONTRAST CT CERVICAL SPINE WITHOUT CONTRAST TECHNIQUE: Multidetector CT imaging of the head and cervical spine was performed following the standard protocol  without intravenous contrast. Multiplanar CT image reconstructions of the cervical spine were also generated. RADIATION DOSE REDUCTION: This exam was performed according to the departmental dose-optimization program which includes automated exposure control, adjustment of the mA and/or kV according to patient size and/or use of iterative reconstruction technique. COMPARISON:  05/24/2018 head CT FINDINGS: CT HEAD FINDINGS Brain: No evidence of acute infarction, hemorrhage, hydrocephalus, extra-axial collection or mass lesion/mass effect. Mild atrophy and chronic small-vessel white matter ischemic changes again noted. Vascular: Carotid and vertebral atherosclerotic calcifications are noted. Skull: No focal lesions. Sinuses/Orbits: Fractures of the RIGHT zygoma, anterior and lateral walls of the RIGHT maxillary sinus and lateral wall of the RIGHT  orbit noted. A probable fracture of the RIGHT orbital floor is identified. Fluid/blood in the RIGHT maxillary sinus is noted. The globes retain their spherical shape. No intraconal abnormality identified. Other: RIGHT facial soft tissue swelling is noted. CT CERVICAL SPINE FINDINGS Alignment: Normal. Skull base and vertebrae: No acute fracture. No primary bone lesion or focal pathologic process. Soft tissues and spinal canal: No prevertebral fluid or swelling. No visible canal hematoma. Disc levels: Mild to moderate multilevel degenerative disc disease, spondylosis and mild facet arthropathy are identified. These findings contribute to mild central spinal and LEFT foraminal narrowing at multiple levels and mild to moderate central spinal and moderate foraminal narrowing at C5-6 and C6-7. Upper chest: No acute abnormality Other: None IMPRESSION: 1. No evidence of acute intracranial abnormality. Mild atrophy and chronic small-vessel white matter ischemic changes. 2. Fractures of the RIGHT zygoma, anterior and lateral walls of the RIGHT maxillary sinus and lateral wall of the  RIGHT orbit with probable fracture of the RIGHT orbital floor. Recommend maxillofacial CT for further evaluation. 3. No static evidence of acute injury to the cervical spine. Degenerative changes as described. Electroni

## 2021-12-26 NOTE — ED Notes (Signed)
Pacemaker interrogation complete

## 2021-12-26 NOTE — ED Notes (Signed)
2nd attempt to interrogate pacemaker ?

## 2021-12-26 NOTE — ED Notes (Signed)
3rd attempt to interrogate pacemaker; pt moved to hallway near ambulance bay for better reception ?

## 2021-12-28 ENCOUNTER — Other Ambulatory Visit: Payer: Self-pay

## 2021-12-28 ENCOUNTER — Telehealth: Payer: Self-pay

## 2021-12-28 DIAGNOSIS — I10 Essential (primary) hypertension: Secondary | ICD-10-CM

## 2021-12-28 NOTE — Telephone Encounter (Signed)
Vascular US reordered for VAS. ?

## 2021-12-30 ENCOUNTER — Telehealth: Payer: Self-pay

## 2021-12-30 ENCOUNTER — Ambulatory Visit (INDEPENDENT_AMBULATORY_CARE_PROVIDER_SITE_OTHER): Payer: PPO

## 2021-12-30 DIAGNOSIS — Z0000 Encounter for general adult medical examination without abnormal findings: Secondary | ICD-10-CM | POA: Diagnosis not present

## 2021-12-30 NOTE — Progress Notes (Signed)
? ?Subjective:  ? Brian Davidson is a 86 y.o. male who presents for Medicare Annual/Subsequent preventive examination. ? ?I connected with  Brian Davidson on 12/30/21 by a audio enabled telemedicine application and verified that I am speaking with the correct person using two identifiers. ? ?Patient Location: Home ? ?Provider Location: Office/Clinic ? ?I discussed the limitations of evaluation and management by telemedicine. The patient expressed understanding and agreed to proceed.  ? ?Review of Systems    ? ?Cardiac Risk Factors include: advanced age (>68mn, >>62women);hypertension;dyslipidemia ? ?   ?Objective:  ?  ?Today's Vitals  ? 12/30/21 1106  ?PainSc: 5   ? ?There is no height or weight on file to calculate BMI. ? ? ?  12/30/2021  ? 11:05 AM 12/26/2021  ? 10:46 AM 08/17/2021  ? 10:16 AM 04/23/2021  ?  2:56 PM 03/06/2021  ? 11:38 AM 12/22/2020  ? 10:22 AM 12/27/2019  ? 10:17 AM  ?Advanced Directives  ?Does Patient Have a Medical Advance Directive? No No Yes No Yes No Yes  ?Type of Advance Directive   Healthcare Power of Attorney      ?Does patient want to make changes to medical advance directive?   No - Patient declined No - Patient declined   No - Patient declined  ?Copy of [place]?   No - copy requested      ?Would patient like information on creating a medical advance directive? No - Patient declined     No - Patient declined   ? ? ?Current Medications (verified) ?Outpatient Encounter Medications as of 12/30/2021  ?Medication Sig  ? Calcium Carbonate-Vitamin D (CALTRATE 600+D PO) Take 600 mg by mouth daily.   ? Cholecalciferol 50 MCG (2000 UT) TABS Take 2,000 Units by mouth daily.   ? diltiazem (CARDIZEM CD) 240 MG 24 hr capsule TAKE ONE (1) CAPSULE EACH DAY BY MOUTH  ? fluticasone (FLONASE) 50 MCG/ACT nasal spray Use 2 spray(s) in each nostril once daily  ? gabapentin (NEURONTIN) 300 MG capsule Take 1 capsule by mouth twice daily  ? losartan (COZAAR) 25 MG tablet Take 1  tablet (25 mg total) by mouth daily.  ? metoprolol tartrate (LOPRESSOR) 100 MG tablet Take 1 tablet by mouth twice daily  ? Multiple Vitamins-Minerals (MULTIVITAMIN WITH MINERALS) tablet Take 1 tablet by mouth daily.  ? rosuvastatin (CRESTOR) 10 MG tablet Take 1 tablet (10 mg total) by mouth daily. Please make yearly appt with Brian. VIrish Davidson April 2023 for future refills. Thank you 1st attempt  ? Turmeric 500 MG TABS Take 500 mg by mouth daily.   ? XARELTO 20 MG TABS tablet Take 1 tablet by mouth once daily  ? ?No facility-administered encounter medications on file as of 12/30/2021.  ? ? ?Allergies (verified) ?Cardizem [diltiazem] and Sulfa antibiotics  ? ?History: ?Past Medical History:  ?Diagnosis Date  ? Arthritis   ? Biliary dyskinesia   ? Carotid artery disease ([place] 07/01[place]2016  ? Dyslipidemia 07/13/2017  ? Dysrhythmia   ? a fib  ? GERD (gastroesophageal reflux disease)   ? occasional  ? H/O measles   ? History of chicken pox   ? History of kidney stones   ? Hypertension   ? [place]uscular [place]   ? neuropathy feet   ? Persistent atrial fibrillation ([place]   ? Pneumonia 1992  ? Presence of permanent cardiac pacemaker   ? [place] cancer ([place]   ? Tachycardia[facility]adycardia ([facility]   ?  a. s/p STJ dual chamber pacemaker  ? Thiamine deficiency 01/19/2018  ? Valvular heart disease 07/01/2016  ? ?Past Surgical History:  ?Procedure Laterality Date  ? CHOLECYSTECTOMY N/A 11/24/2015  ? Procedure: LAPAROSCOPIC CHOLECYSTECTOMY;  Surgeon: Brian Pickerel, MD;  Location: [facility];  Service: General;  Laterality: N/A;  ? CYSTOSCOPY  2016  ? EYE SURGERY    ? bil cataract  ? FRACTURE SURGERY  2005  ? left ankle  ? HERNIA REPAIR    ? left groin, no mesh  ? PERMANENT PACEMAKER INSERTION N/A 01/31/2012  ? SJM Accent SR RF implanted by Brian Davidson  ? TOTAL KNEE ARTHROPLASTY Right 12/23/2019  ? Procedure: TOTAL KNEE ARTHROPLASTY;  Surgeon: Brian Arabian, MD;  Location: [facility] OR[facility];  Service: Orthopedics;  Laterality: Right;  35mn  ? ?Family  History  ?Problem Relation Age of Onset  ? Neuropathy Mother   ? Hypertension Mother   ? Arthritis Mother   ? Drug abuse Mother   ? Stroke Father   ? Heart disease Father   ? Prostate cancer Father   ?     not treated  ? Heart disease Sister [id number] ? Heart disease Sister   ? Heart disease Brother   ? Neuropathy Brother   ? Hypertension Daughter   ? Colon cancer Neg Hx   ? ?Social History  ? ?Socioeconomic History  ? Marital status: Married  ?  Spouse name: Brian Davidson ? Number of children: 1  ? Years of education: Not on file  ? Highest education level: Not on file  ?Occupational History  ? Occupation: Retired  ? 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 Smoking status: Former  ?  Types: Pipe, Cigars  ?  Quit date: 11/18/1985  ?  Years since quitting: 36.1  ? Smokeless tobacco: Never  ?Vaping Use  ? Vaping Use: Never used  ?Substance and Sexual Activity  ? Alcohol use: Yes  ?  Alcohol/week: 3.0 standard drinks  ?  Types: 3 Cans of beer per week  ?  Comment: occ  ? Drug use: No  ? Sexual activity: Not Currently  ?Other Topics Concern  ? Not on file  ?Social History Narrative  ? Lives with wife, still works part-time as a dGeophysicist/field seismologist He is active around the house and yard...  ? Parents are deceased and did not have cardiac issues but he has 2 siblings with atrial fibrillation and a sister-in-law  has a pacemaker.  ? ?Social Determinants of Health  ? ?Financial Resource Strain: Low Risk   ? Difficulty of Paying Living Expenses: Not hard at all  ?Food Insecurity: No Food Insecurity  ? Worried About RCharity fundraiserin the Last Year: Never true  ? Ran Out of Food in the Last Year: Never true  ?Transportation Needs: No Transportation Needs  ? Lack of Transportation (Medical): No  ? Lack of Transportation (Non-Medical): No  ?Physical Activity: Sufficiently Active  ? Days of Exercise per Week: 4 days  ? Minutes of Exercise per Session: 60 min  ?Stress: No Stress Concern Present  ? Feeling of Stress : Not at all   ?Social Connections: Moderately Isolated  ? Frequency of Communication with Friends and Family: Three times a week  ? Frequency of Social Gatherings with Friends and Family: Three times a week  ? Attends Religious Services: Never  ? Active Member of Clubs or Organizations: No  ? Attends CArchivistMeetings: Never  ? Marital Status:  Married  ? ? ?Tobacco Counseling ?Counseling given: Not Answered ? ? ?Clinical Intake: ? ?Pre-visit preparation completed: Yes ? ?Pain : 0-10 ?Pain Score: 5  ?Pain Type: Acute pain ?Pain Location: Rib cage ?Pain Orientation: Right ?Pain Descriptors / Indicators: Sore ?Pain Onset: In the past 7 days ?Pain Frequency: Constant ? ?  ? ?Nutritional Risks: None ?Diabetes: No ? ?How often do you need to have someone help you when you read instructions, pamphlets, or other written materials from your doctor or pharmacy?: 1 - Never ? ?Diabetic?No ? ?Interpreter Needed?: No ? ?Information entered by :: Kambrie Eddleman ? ? ?Activities of Daily Living ? ?  12/30/2021  ? 11:10 AM 03/16/2021  ? 11:45 AM  ?In your present state of health, do you have any difficulty performing the following activities:  ?Hearing? 0 0  ?Vision? 0 0  ?Difficulty concentrating or making decisions? 0 0  ?Walking or climbing stairs? 0 0  ?Dressing or bathing? 0 0  ?Doing errands, shopping? 0 0  ?Preparing Food and eating ? N   ?Using the Toilet? N   ?In the past six months, have you accidently leaked urine? N   ?Do you have problems with loss of bowel control? N   ?Managing your Medications? N   ?Managing your Finances? N   ?Housekeeping or managing your Housekeeping? N   ? ? ?Patient Care Team: ?Mosie Lukes, MD as PCP - General (Family Medicine) ?Thompson Grayer, MD as PCP - Electrophysiology (Cardiology) ?Jettie Booze, MD as PCP - Cardiology (Cardiology) ?Thompson Grayer, MD as Consulting Physician (Cardiology) ?Franchot Mimes, MD as Consulting Physician (Family Medicine) ?Armbruster, Carlota Raspberry, MD as  Consulting Physician (Gastroenterology) ?Brian Arabian, MD as Consulting Physician (Orthopedic Surgery) ?Cherre Robins, RPH-CPP (Pharmacist) ?Tyler Pita, MD as Consulting Physician (Radiation Oncology) ?Es

## 2021-12-30 NOTE — Patient Instructions (Signed)
Brian Davidson , ?Thank you for taking time to come for your Medicare Wellness Visit. I appreciate your ongoing commitment to your health goals. Please review the following plan we discussed and let me know if I can assist you in the future.  ? ?Screening recommendations/referrals: ?Colonoscopy: no longer needed ?Recommended yearly ophthalmology/optometry visit for glaucoma screening and checkup ?Recommended yearly dental visit for hygiene and checkup ? ?Vaccinations: ?Influenza vaccine: up to date ?Pneumococcal vaccine: up to date ?Tdap vaccine: up to date ?Shingles vaccine: up to date   ?Covid-19: completed ? ?Advanced directives: no  ? ?Conditions/risks identified: see problem list  ? ?Next appointment: Follow up in one year for your annual wellness visit.  ? ?Preventive Care 84 Years and Older, Male ?Preventive care refers to lifestyle choices and visits with your health care provider that can promote health and wellness. ?What does preventive care include? ?A yearly physical exam. This is also called an annual well check. ?Dental exams once or twice a year. ?Routine eye exams. Ask your health care provider how often you should have your eyes checked. ?Personal lifestyle choices, including: ?Daily care of your teeth and gums. ?Regular physical activity. ?Eating a healthy diet. ?Avoiding tobacco and drug use. ?Limiting alcohol use. ?Practicing safe sex. ?Taking low doses of aspirin every day. ?Taking vitamin and mineral supplements as recommended by your health care provider. ?What happens during an annual well check? ?The services and screenings done by your health care provider during your annual well check will depend on your age, overall health, lifestyle risk factors, and family history of disease. ?Counseling  ?Your health care provider may ask you questions about your: ?Alcohol use. ?Tobacco use. ?Drug use. ?Emotional well-being. ?Home and relationship well-being. ?Sexual activity. ?Eating habits. ?History of  falls. ?Memory and ability to understand (cognition). ?Work and work Statistician. ?Screening  ?You may have the following tests or measurements: ?Height, weight, and BMI. ?Blood pressure. ?Lipid and cholesterol levels. These may be checked every 5 years, or more frequently if you are over 54 years old. ?Skin check. ?Lung cancer screening. You may have this screening every year starting at age 43 if you have a 30-pack-year history of smoking and currently smoke or have quit within the past 15 years. ?Fecal occult blood test (FOBT) of the stool. You may have this test every year starting at age 55. ?Flexible sigmoidoscopy or colonoscopy. You may have a sigmoidoscopy every 5 years or a colonoscopy every 10 years starting at age 6. ?Prostate cancer screening. Recommendations will vary depending on your family history and other risks. ?Hepatitis C blood test. ?Hepatitis B blood test. ?Sexually transmitted disease (STD) testing. ?Diabetes screening. This is done by checking your blood sugar (glucose) after you have not eaten for a while (fasting). You may have this done every 1-3 years. ?Abdominal aortic aneurysm (AAA) screening. You may need this if you are a current or former smoker. ?Osteoporosis. You may be screened starting at age 47 if you are at high risk. ?Talk with your health care provider about your test results, treatment options, and if necessary, the need for more tests. ?Vaccines  ?Your health care provider may recommend certain vaccines, such as: ?Influenza vaccine. This is recommended every year. ?Tetanus, diphtheria, and acellular pertussis (Tdap, Td) vaccine. You may need a Td booster every 10 years. ?Zoster vaccine. You may need this after age 12. ?Pneumococcal 13-valent conjugate (PCV13) vaccine. One dose is recommended after age 42. ?Pneumococcal polysaccharide (PPSV23) vaccine. One dose is recommended after  age 65. ?Talk to your health care provider about which screenings and vaccines you need and  how often you need them. ?This information is not intended to replace advice given to you by your health care provider. Make sure you discuss any questions you have with your health care provider. ?Document Released: 09/18/2015 Document Revised: 05/11/2016 Document Reviewed: 06/23/2015 ?Elsevier Interactive Patient Education ? 2017 [place]. ? ?Fall Prevention in the Home ?Falls can cause injuries. They can happen to people of all ages. There are many things you can do to make your home safe and to help prevent falls. ?What can I do on the outside of my home? ?Regularly fix the edges of walkways and driveways and fix any cracks. ?Remove anything that might make you trip as you walk through a door, such as a raised step or threshold. ?Trim any bushes or trees on the path to your home. ?Use bright outdoor lighting. ?Clear any walking paths of anything that might make someone trip, such as rocks or tools. ?Regularly check to see if handrails are loose or broken. Make sure that both sides of any steps have handrails. ?Any raised decks and porches should have guardrails on the edges. ?Have any leaves, snow, or ice cleared regularly. ?Use sand or salt on walking paths during winter. ?Clean up any spills in your garage right away. This includes oil or grease spills. ?What can I do in the bathroom? ?Use night lights. ?Install grab bars by the toilet and in the tub and shower. Do not use towel bars as grab bars. ?Use non-skid mats or decals in the tub or shower. ?If you need to sit down in the shower, use a plastic, non-slip stool. ?Keep the floor dry. Clean up any water that spills on the floor as soon as it happens. ?Remove soap buildup in the tub or shower regularly. ?Attach bath mats securely with double-sided non-slip rug tape. ?Do not have throw rugs and other things on the floor that can make you trip. ?What can I do in the bedroom? ?Use night lights. ?Make sure that you have a light by your bed that is easy to  reach. ?Do not use any sheets or blankets that are too big for your bed. They should not hang down onto the floor. ?Have a firm chair that has side arms. You can use this for support while you get dressed. ?Do not have throw rugs and other things on the floor that can make you trip. ?What can I do in the kitchen? ?Clean up any spills right away. ?Avoid walking on wet floors. ?Keep items that you use a lot in easy-to-reach places. ?If you need to reach something above you, use a strong step stool that has a grab bar. ?Keep electrical cords out of the way. ?Do not use floor polish or wax that makes floors slippery. If you must use wax, use non-skid floor wax. ?Do not have throw rugs and other things on the floor that can make you trip. ?What can I do with my stairs? ?Do not leave any items on the stairs. ?Make sure that there are handrails on both sides of the stairs and use them. Fix handrails that are broken or loose. Make sure that handrails are as long as the stairways. ?Check any carpeting to make sure that it is firmly attached to the stairs. Fix any carpet that is loose or worn. ?Avoid having throw rugs at the top or bottom of the stairs. If you do  have throw rugs, attach them to the floor with carpet tape. ?Make sure that you have a light switch at the top of the stairs and the bottom of the stairs. If you do not have them, ask someone to add them for you. ?What else can I do to help prevent falls? ?Wear shoes that: ?Do not have high heels. ?Have rubber bottoms. ?Are comfortable and fit you well. ?Are closed at the toe. Do not wear sandals. ?If you use a stepladder: ?Make sure that it is fully opened. Do not climb a closed stepladder. ?Make sure that both sides of the stepladder are locked into place. ?Ask someone to hold it for you, if possible. ?Clearly mark and make sure that you can see: ?Any grab bars or handrails. ?First and last steps. ?Where the edge of each step is. ?Use tools that help you move  around (mobility aids) if they are needed. These include: ?Canes. ?Walkers. ?Scooters. ?Crutches. ?Turn on the lights when you go into a dark area. Replace any light bulbs as soon as they burn out. ?Set up y

## 2021-12-30 NOTE — Telephone Encounter (Signed)
Pt wanted to not go to Dr. Benjamine Mola because he told him he could help him. So, could another referral be placed and not be sent to this provider. ?

## 2022-01-01 ENCOUNTER — Other Ambulatory Visit: Payer: Self-pay | Admitting: Interventional Cardiology

## 2022-01-05 ENCOUNTER — Encounter (HOSPITAL_BASED_OUTPATIENT_CLINIC_OR_DEPARTMENT_OTHER): Payer: Self-pay

## 2022-01-05 ENCOUNTER — Emergency Department (HOSPITAL_BASED_OUTPATIENT_CLINIC_OR_DEPARTMENT_OTHER)
Admission: EM | Admit: 2022-01-05 | Discharge: 2022-01-05 | Disposition: A | Discharge status: Home / Self Care | Payer: PPO | Attending: Emergency Medicine | Admitting: Emergency Medicine

## 2022-01-05 ENCOUNTER — Other Ambulatory Visit: Payer: Self-pay

## 2022-01-05 DIAGNOSIS — W19XXXD Unspecified fall, subsequent encounter: Secondary | ICD-10-CM | POA: Insufficient documentation

## 2022-01-05 DIAGNOSIS — S0181XD Laceration without foreign body of other part of head, subsequent encounter: Secondary | ICD-10-CM | POA: Diagnosis not present

## 2022-01-05 DIAGNOSIS — Z4802 Encounter for removal of sutures: Secondary | ICD-10-CM | POA: Diagnosis not present

## 2022-01-05 DIAGNOSIS — S0081XD Abrasion of other part of head, subsequent encounter: Secondary | ICD-10-CM | POA: Diagnosis not present

## 2022-01-05 DIAGNOSIS — Z7901 Long term (current) use of anticoagulants: Secondary | ICD-10-CM | POA: Diagnosis not present

## 2022-01-05 DIAGNOSIS — I10 Essential (primary) hypertension: Secondary | ICD-10-CM | POA: Insufficient documentation

## 2022-01-05 DIAGNOSIS — S61411D Laceration without foreign body of right hand, subsequent encounter: Secondary | ICD-10-CM | POA: Diagnosis not present

## 2022-01-05 DIAGNOSIS — I4891 Unspecified atrial fibrillation: Secondary | ICD-10-CM | POA: Diagnosis not present

## 2022-01-05 DIAGNOSIS — S6991XD Unspecified injury of right wrist, hand and finger(s), subsequent encounter: Secondary | ICD-10-CM | POA: Diagnosis present

## 2022-01-05 DIAGNOSIS — Z79899 Other long term (current) drug therapy: Secondary | ICD-10-CM | POA: Diagnosis not present

## 2022-01-05 NOTE — ED Provider Notes (Signed)
?[facility] ?Provider Note ? ? ?CSN: [id number] ?Arrival date & time: 01/05/22  1039 ? ?  ? ?History ? ?Chief Complaint  ?Patient presents with  ? Suture / Staple Removal  ? ? ?Brian Davidson is a 86 y.o. male. ? ?Patient with history of afib and hypertension presents today requesting suture removal. States that 10 days ago he fell and had a laceration to the palmar surface of his right hand. Was seen for same and had 2 sutures placed. Presents today for removal of same. Denies and fevers, chills, or drainage from wounds. Additionally, states that when he fell he was told to follow-up with maxillofacial surgery for management of facial fractures, however states that when he called the number he was referred to he was told that this specialist did not deal with his kind of injuries. He is requesting appropriate referral. Denies any headaches, vision changes, or painful eye movements. ? ?The history is provided by the patient. No language interpreter was used.  ?Suture / Staple Removal ?Pertinent negatives include no headaches.  ? ?  ? ?Home Medications ?Prior to Admission medications   ?Medication Sig Start Date End Date Taking? Authorizing Provider  ?Calcium Carbonate-Vitamin D (CALTRATE 600+D PO) Take 600 mg by mouth daily.     [provider]  ?Cholecalciferol 50 MCG (2000 UT) TABS Take 2,000 Units by mouth daily.     [provider]  ?diltiazem (CARDIZEM CD) 240 MG 24 hr capsule TAKE ONE (1) CAPSULE EACH DAY BY MOUTH 10/04/21   Mosie Lukes, MD  ?fluticasone Asencion Islam) 50 MCG/ACT nasal spray Use 2 spray(s) in each nostril once daily 09/10/21   Mosie Lukes, MD  ?gabapentin (NEURONTIN) 300 MG capsule Take 1 capsule by mouth twice daily 08/23/21   Mosie Lukes, MD  ?losartan (COZAAR) 25 MG tablet Take 1 tablet (25 mg total) by mouth daily. 12/16/21   Mosie Lukes, MD  ?metoprolol tartrate (LOPRESSOR) 100 MG tablet Take 1 tablet by mouth twice daily 09/06/21    Mosie Lukes, MD  ?Multiple Vitamins-Minerals (MULTIVITAMIN WITH MINERALS) tablet Take 1 tablet by mouth daily. 03/16/21   Mosie Lukes, MD  ?rosuvastatin (CRESTOR) 10 MG tablet Take 1 tablet (10 mg total) by mouth daily. 01/03/22   Jettie Booze, MD  ?Turmeric 500 MG TABS Take 500 mg by mouth daily.     [provider]  ?Alveda Reasons 20 MG TABS tablet Take 1 tablet by mouth once daily 10/11/21   Mosie Lukes, MD  ?   ? ?Allergies    ?Cardizem [diltiazem] and Sulfa antibiotics   ? ?Review of Systems   ?Review of Systems  ?Constitutional:  Negative for chills and fever.  ?Skin:  Positive for wound.  ?Neurological:  Negative for dizziness, tremors, seizures, syncope, facial asymmetry, speech difficulty, weakness, light-headedness, numbness and headaches.  ?All other systems reviewed and are negative. ? ?Physical Exam ?Updated Vital Signs ?BP (!) 151/70 (BP Location: Right Arm)   Pulse 72   Temp 98 ?F (36.7 ?C) (Oral)   Resp 18   Ht 6' (1.829 m)   Wt 90.7 kg   SpO2 99%   BMI 27.12 kg/m?  ?Physical Exam ?Vitals and nursing note reviewed.  ?Constitutional:   ?   General: He is not in acute distress. ?   Appearance: Normal appearance. He is normal weight. He is not ill-appearing, toxic-appearing or diaphoretic.  ?HENT:  ?   Head: Normocephalic and atraumatic.  ?  Comments: Well healing abrasion noted over the right orbit. No drainage, erythema, or warmth ?Eyes:  ?   Extraocular Movements: Extraocular movements intact.  ?   Pupils: Pupils are equal, round, and reactive to light.  ?Cardiovascular:  ?   Rate and Rhythm: Normal rate.  ?Pulmonary:  ?   Effort: Pulmonary effort is normal. No respiratory distress.  ?Musculoskeletal:     ?   General: Normal range of motion.  ?   Cervical back: Normal range of motion.  ?   Comments: Well healing laceration noted over the palmar surface of the right palm. 2 sutures in place. No erythema or drainage noted.  ?Skin: ?   General: Skin is warm and dry.   ?Neurological:  ?   General: No focal deficit present.  ?   Mental Status: He is alert.  ?Psychiatric:     ?   Mood and Affect: Mood normal.     ?   Behavior: Behavior normal.  ? ? ?ED Results / Procedures / Treatments   ?Labs ?(all labs ordered are listed, but only abnormal results are displayed) ?Labs Reviewed - No data to display ? ?EKG ?None ? ?Radiology ?No results found. ? ?Procedures ?Marland KitchenSuture Removal ? ?Date/Time: 01/05/2022 11:23 AM ?Performed by: Bud Face, PA-C ?Authorized by: Bud Face, PA-C  ? ?Consent:  ?  Consent obtained:  Verbal ?  Consent given by:  Patient ?  Risks, benefits, and alternatives were discussed: yes   ?  Risks discussed:  Bleeding, pain and wound separation ?  Alternatives discussed:  No treatment ?Universal protocol:  ?  Procedure explained and questions answered to patient or proxy's satisfaction: yes   ?  Patient identity confirmed:  Verbally with patient ?Location:  ?  Location:  Upper extremity ?  Upper extremity location:  Hand ?  Hand location:  R hand ?Procedure details:  ?  Wound appearance:  No signs of infection ?  Number of sutures removed:  2 ?Post-procedure details:  ?  Post-removal:  Band-Aid applied and antibiotic ointment applied ?  Procedure completion:  Tolerated well, no immediate complications  ? ? ?Medications Ordered in ED ?Medications - No data to display ? ?ED Course/ Medical Decision Making/ A&P ?  ?                        ?Medical Decision Making ? ?Staple removal  ? ?Pt to ER for staple/suture removal and wound check as above. Procedure tolerated well, no further concerns at this time. Vitals normal, no signs of infection. Scar minimization & return precautions given at dc.  ? ?Additionally, referral to reconstructive surgery given for management of facial fractures seen on prior imaging. Patient is stable for discharge at this time, educated on red flag symptoms that would prompt immediate return. Discharged in stable condition.  ? ?Findings and  plan of care discussed with supervising physician Dr. Pearline Cables who is in agreement.  ?  ? ?Final Clinical Impression(s) / ED Diagnoses ?Final diagnoses:  ?Visit for suture removal  ? ? ?Rx / DC Orders ?ED Discharge Orders   ? ? None  ? ?  ?An After Visit Summary was printed and given to the patient. ? ? ?  ?Bud Face, PA-C ?01/05/22 1141 ? ?  ?Jeanell Sparrow, DO ?01/08/22 1216 ? ?

## 2022-01-05 NOTE — ED Notes (Signed)
ED Provider at bedside. 

## 2022-01-05 NOTE — ED Notes (Signed)
Discharge paperwork provided, no further questions at this time, pt A&O x 4 at time of departure, NAD noted. ?

## 2022-01-05 NOTE — ED Triage Notes (Signed)
Here for suture removal. Also wants right eye checked, hematoma noted. ?

## 2022-01-05 NOTE — Discharge Instructions (Signed)
2 sutures were removed from your hand today. The wound is well appearing on my exam. Please continue to monitor for signs of infection, this would be reason to return to the ER for further evaluation and management. I have also given you a referral to plastic and reconstructive surgery for management of your facial fractures.  Please call to schedule appointment.  ? ?Return if development of any new or worsening symptoms. ?

## 2022-01-10 ENCOUNTER — Other Ambulatory Visit (INDEPENDENT_AMBULATORY_CARE_PROVIDER_SITE_OTHER): Payer: PPO

## 2022-01-10 ENCOUNTER — Ambulatory Visit (INDEPENDENT_AMBULATORY_CARE_PROVIDER_SITE_OTHER): Payer: PPO | Admitting: *Deleted

## 2022-01-10 VITALS — BP 138/74 | HR 73

## 2022-01-10 DIAGNOSIS — I10 Essential (primary) hypertension: Secondary | ICD-10-CM

## 2022-01-10 NOTE — Progress Notes (Signed)
Pt here for Blood pressure check per Dr. Charlett Blake ? ?Pt currently takes: Losartan '25mg'$  in the evening ?Metoprolol '100mg'$  bid ? ? ?Pt reports compliance with medication. ? ?BP today @ =138/74  ?HR = 73 ? ?Charlett Blake out of office, Dr. Lorelei Pont is DOD ? ? ?

## 2022-01-11 LAB — COMPREHENSIVE METABOLIC PANEL
ALT: 19 U/L (ref 0–53)
AST: 22 U/L (ref 0–37)
Albumin: 3.8 g/dL (ref 3.5–5.2)
Alkaline Phosphatase: 101 U/L (ref 39–117)
BUN: 22 mg/dL (ref 6–23)
CO2: 27 mEq/L (ref 19–32)
Calcium: 8.8 mg/dL (ref 8.4–10.5)
Chloride: 104 mEq/L (ref 96–112)
Creatinine, Ser: 1.44 mg/dL (ref 0.40–1.50)
GFR: 44.68 mL/min — ABNORMAL LOW (ref >60.00)
Glucose, Bld: 104 mg/dL — ABNORMAL HIGH (ref 70–99)
Potassium: 4 mEq/L (ref 3.5–5.1)
Sodium: 139 mEq/L (ref 135–145)
Total Bilirubin: 0.9 mg/dL (ref 0.2–1.2)
Total Protein: 6.7 g/dL (ref 6.0–8.3)

## 2022-01-13 ENCOUNTER — Ambulatory Visit (HOSPITAL_COMMUNITY)
Admission: RE | Admit: 2022-01-13 | Discharge: 2022-01-13 | Disposition: A | Discharge status: Home / Self Care | Payer: PPO | Source: Ambulatory Visit | Attending: Family Medicine | Admitting: Family Medicine

## 2022-01-13 DIAGNOSIS — I10 Essential (primary) hypertension: Secondary | ICD-10-CM | POA: Insufficient documentation

## 2022-01-13 NOTE — Progress Notes (Signed)
Renal artery duplex completed. ?Refer to "CV Proc" under chart review to view preliminary results. ? ?01/13/2022 9:56 AM ?Kelby Aline., MHA, RVT, RDCS, RDMS   ?

## 2022-01-14 ENCOUNTER — Other Ambulatory Visit: Payer: Self-pay | Admitting: *Deleted

## 2022-01-14 ENCOUNTER — Telehealth: Payer: Self-pay | Admitting: Family Medicine

## 2022-01-14 DIAGNOSIS — I701 Atherosclerosis of renal artery: Secondary | ICD-10-CM

## 2022-01-14 DIAGNOSIS — K551 Chronic vascular disorders of intestine: Secondary | ICD-10-CM

## 2022-01-14 DIAGNOSIS — I771 Stricture of artery: Secondary | ICD-10-CM

## 2022-01-14 NOTE — Telephone Encounter (Signed)
Results given to patient and referral has been sent. ?

## 2022-01-14 NOTE — Telephone Encounter (Signed)
Pt states some one contacted him regarding imagining results. Not information on chart. ?

## 2022-01-14 NOTE — Telephone Encounter (Signed)
Patient called back and was transferred to [facility] for results.  ?

## 2022-01-14 NOTE — Telephone Encounter (Signed)
Raynelle Bring Frierson-Sandells, CMA  ?01/13/2022  5:00 PM EDT   ?  ?Called pt, no answer, left VM asking for return call.  ? Mosie Lukes, MD  ?01/13/2022  1:19 PM EDT   ?  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 ?

## 2022-01-14 NOTE — Telephone Encounter (Signed)
Patient called stated Brian Davidson called him stating his renal study was back , I made him aware its nothing in his chart and he stated the [place] was done and  he can see it in mychart  ?

## 2022-01-20 ENCOUNTER — Ambulatory Visit: Payer: PPO | Admitting: Vascular Surgery

## 2022-01-20 ENCOUNTER — Encounter: Payer: Self-pay | Admitting: Vascular Surgery

## 2022-01-20 VITALS — BP 168/76 | HR 66 | Temp 98.3°F | Resp 20 | Ht 72.0 in | Wt 210.8 lb

## 2022-01-20 DIAGNOSIS — I771 Stricture of artery: Secondary | ICD-10-CM

## 2022-01-20 DIAGNOSIS — I701 Atherosclerosis of renal artery: Secondary | ICD-10-CM

## 2022-01-20 NOTE — Progress Notes (Signed)
ASSESSMENT & PLAN   CELIAC ARTERY STENOSIS: This patient has a widely patent SMA but a greater than 70% celiac artery stenosis by duplex.  However he is asymptomatic.  He has no postprandial abdominal pain and no significant weight loss.  I would not recommend CT angiogram or further work-up unless he developed symptoms.  I do not think routine follow-up is needed.  We have discussed the importance of exercise and nutrition.  Fortunately he is not a smoker.  He is on a statin.  MILD LEFT RENAL ARTERY STENOSIS: The patient has a less than 60% left renal artery stenosis with no significant stenosis on the right.  His blood pressure is under good control on his current medications.  I would not recommend an aggressive work-up for this unless his blood pressure suddenly became more difficult to control.  In that case a CT angiogram would be helpful.  Again I do not think routine follow-up is necessary.  CHRONIC VENOUS INSUFFICIENCY: This patient has large dilated varicose veins of both lower extremities (CEAP C2 venous disease).  However this is asymptomatic.  We have discussed the importance of intermittent leg elevation and the proper positioning for this.  We discussed potentially using compression stockings but he was not interested in this.  I encouraged him to avoid prolonged sitting and standing.  We discussed the importance of exercise.  I would only consider formal venous reflux testing if he became symptomatic.  REASON FOR CONSULT:    Renal artery stenosis and mesenteric arterial occlusive disease.  The consult is requested by Dr. Willette Alma.  HPI:   Brian Davidson is a 86 y.o. male who underwent a renal artery duplex to work-up hypertension.  This showed a mild less than 60% left renal artery stenosis with no significant stenosis on the right.  He was incidentally noted to have a greater than 70% celiac artery stenosis.  He sent for vascular consultation.  On my history, the patient  states that his blood pressure has been under good control.  He is currently on a calcium channel blocker (diltiazem), an ARB (losartan), and a beta-blocker (Lopressor).  He denies any hematuria.  He denies any postprandial abdominal pain or significant weight loss.  He denies any claudication, rest pain, or nonhealing ulcers.  Past Medical History:  Diagnosis Date   Arthritis    Biliary dyskinesia    Carotid artery disease ([place]) 07/01/2016   Dyslipidemia 07/13/2017   Dysrhythmia    a fib   GERD (gastroesophageal reflux disease)    occasional   H/O measles    History of chicken pox    History of kidney stones    Hypertension    Neuromuscular disorder (HCC)    neuropathy feet    Persistent atrial fibrillation ([facility])    Pneumonia 1992   Presence of permanent cardiac pacemaker    Prostate cancer ([place])    Tachycardia-bradycardia ([place])    a. s/p STJ dual chamber pacemaker   Thiamine deficiency 01/19/2018   Valvular heart disease 07/01/2016    Family History  Problem Relation Age of Onset   Neuropathy Mother    Hypertension Mother    Arthritis Mother    Drug abuse Mother    Stroke Father    Heart disease Father    Prostate cancer Father        not treated   Heart disease Sister 51   Heart disease Sister    Heart disease Brother    Neuropathy Brother  Hypertension Daughter    Colon cancer Neg Hx     SOCIAL HISTORY: Social History   Tobacco Use   Smoking status: Former    Types: Pipe, Cigars    Quit date: 11/18/1985    Years since quitting: 36.1   Smokeless tobacco: Never  Substance Use Topics   Alcohol use: Yes    Alcohol/week: 3.0 standard drinks    Types: 3 Cans of beer per week    Comment: occ    Allergies  Allergen Reactions   Cardizem [Diltiazem]     Rash from Yellow dye in generic capsule.   Can take different brand. Takes diltiazem - his allergy is to Cardizem   Sulfa Antibiotics     Unknown childhood reaction    Current Outpatient Medications   Medication Sig Dispense Refill   Calcium Carbonate-Vitamin D (CALTRATE 600+D PO) Take 600 mg by mouth daily.      Cholecalciferol 50 MCG (2000 UT) TABS Take 2,000 Units by mouth daily.      diltiazem (CARDIZEM CD) 240 MG 24 hr capsule TAKE ONE (1) CAPSULE EACH DAY BY MOUTH 90 capsule 1   fluticasone (FLONASE) 50 MCG/ACT nasal spray Use 2 spray(s) in each nostril once daily 16 g 5   gabapentin (NEURONTIN) 300 MG capsule Take 1 capsule by mouth twice daily 180 capsule 1   losartan (COZAAR) 25 MG tablet Take 1 tablet (25 mg total) by mouth daily. 30 tablet 2   metoprolol tartrate (LOPRESSOR) 100 MG tablet Take 1 tablet by mouth twice daily 180 tablet 1   Multiple Vitamins-Minerals (MULTIVITAMIN WITH MINERALS) tablet Take 1 tablet by mouth daily. 30 tablet 3   rosuvastatin (CRESTOR) 10 MG tablet Take 1 tablet (10 mg total) by mouth daily. 90 tablet 0   Turmeric 500 MG TABS Take 500 mg by mouth daily.      XARELTO 20 MG TABS tablet Take 1 tablet by mouth once daily 90 tablet 0   No current facility-administered medications for this visit.    REVIEW OF SYSTEMS:  '[X]'$  denotes positive finding, '[ ]'$  denotes negative finding Cardiac  Comments:  Chest pain or chest pressure:    Shortness of breath upon exertion:    Short of breath when lying flat:    Irregular heart rhythm:        Vascular    Pain in calf, thigh, or hip brought on by ambulation:    Pain in feet at night that wakes you up from your sleep:     Blood clot in your veins:    Leg swelling:         Pulmonary    Oxygen at home:    Productive cough:     Wheezing:         Neurologic    Sudden weakness in arms or legs:     Sudden numbness in arms or legs:     Sudden onset of difficulty speaking or slurred speech:    Temporary loss of vision in one eye:     Problems with dizziness:         Gastrointestinal    Blood in stool:     Vomited blood:         Genitourinary    Burning when urinating:     Blood in urine:         Psychiatric    Major depression:         Hematologic    Bleeding problems:    Problems  with blood clotting too easily:        Skin    Rashes or ulcers:        Constitutional    Fever or chills:    -  PHYSICAL EXAM:   Vitals:   01/20/22 1404  BP: (!) 168/76  Pulse: 66  Resp: 20  Temp: 98.3 F (36.8 C)  SpO2: 96%  Weight: 210 lb 12.8 oz (95.6 kg)  Height: 6' (1.829 m)   Body mass index is 28.59 kg/m. GENERAL: The patient is a well-nourished male, in no acute distress. The vital signs are documented above. CARDIAC: There is a regular rate and rhythm.  VASCULAR: I do not detect carotid bruits. On the right side, he has a palpable femoral, popliteal, and posterior tibial pulse. On the left side, he has a palpable femoral, popliteal, and dorsalis pedis pulse. He has large varicose veins in both legs but especially on the right. PULMONARY: There is good air exchange bilaterally without wheezing or rales. ABDOMEN: Soft and non-tender with normal pitched bowel sounds.  I do not palpate an abdominal aortic aneurysm. MUSCULOSKELETAL: There are no major deformities. NEUROLOGIC: No focal weakness or paresthesias are detected. SKIN: There are no ulcers or rashes noted. PSYCHIATRIC: The patient has a normal affect.  DATA:    RENAL ARTERY DUPLEX: I reviewed his renal artery duplex scan that was done on 01/14/2022.  There was no evidence of renal artery stenosis on the right.  There was a less than 60% left renal artery stenosis.  There was a greater than 70% celiac artery stenosis.  The SMA was widely patent.  The IMA had evidence of stenosis.  Deitra [facility]

## 2022-01-27 ENCOUNTER — Ambulatory Visit (INDEPENDENT_AMBULATORY_CARE_PROVIDER_SITE_OTHER): Payer: PPO | Admitting: Pharmacist

## 2022-01-27 DIAGNOSIS — M8580 Other specified disorders of bone density and structure, unspecified site: Secondary | ICD-10-CM

## 2022-01-27 DIAGNOSIS — I10 Essential (primary) hypertension: Secondary | ICD-10-CM

## 2022-01-27 DIAGNOSIS — E782 Mixed hyperlipidemia: Secondary | ICD-10-CM

## 2022-01-27 MED ORDER — LOSARTAN POTASSIUM 25 MG PO TABS: 25.0000 mg | ORAL_TABLET | Freq: Every day | ORAL | 1 refills | Status: DC

## 2022-01-27 NOTE — Patient Instructions (Addendum)
Mr. Inniss It was a pleasure speaking with you  Below is a summary of your health goals and care plan   Make sure to get adequate hydration - suggested hydrate prior to working outside, during work and after to prevent dehydration.  Recommend a little snack prior to working outside / strenuous activity to prevent low blood sugar- cheese with 4 or 5 crackers, peanut butter with crackers or apple, yogurt or half a sandwich.   If you have any questions or concerns, please feel free to contact me either at the phone number below or with a MyChart message.   Keep up the good work!  Cherre Robins, PharmD Clinical Pharmacist [facility] [phone] (direct line)  [phone] (main office number)  Chronic Care Management Care Plan  Hypertension / Aneurysm BP Readings from Last 3 Encounters:  01/20/22 (!) 168/76  01/10/22 138/74  01/05/22 (!) 151/70   Pharmacist Clinical Goal(s): Over the next 90 days, patient will work with PharmD and providers to achieve BP goal <130/80 Current regimen:  Metoprolol tartrate '100mg'$  twice daily Diltiazem '240mg'$  daily each afternoon Losartan '25mg'$  daily  Interventions: Discussed blood pressure goals and tips to getting accurate blood pressure reading Requested patient to check blood pressure 2 to 3 times per week and record Patient self care activities - Over the next 90 days, patient will: Continue current therapy for blood pressure / heart rate Check blood pressure 2 to 3 times per week, document, and provide at future appointments Ensure daily salt intake < 2300 mg/day  Hyperlipidemia Lab Results  Component Value Date/Time   LDLCALC 45 12/07/2021 10:50 AM   LDLCALC 107 (H) 05/26/2020 11:03 AM   Pharmacist Clinical Goal(s): Over the next 90 days, patient will work with PharmD and providers to maintain LDL goal < 70 Current regimen:  Rosuvastatin '10mg'$  daily  Interventions: Discussed LDL goal Patient self care  activities - Over the next 90 days, patient will: Continue to take rosuvastatin daily Goal is to get 150 minutes of exercise per week - restart activity after radiation therapy complete.   Osteopenia Pharmacist Clinical Goal(s) Over the next 90 days, patient will work with PharmD and providers to reduce risk of fracture due to osteopenia Current regimen:  Calcium '600mg'$  daily Vitamin D 2000 units daily  Interventions: Maintain osteopenia medication regimen    Neuropathy Pharmacist Clinical Goal(s) Over the next 90 days, patient will work with PharmD and providers to reduce risk symptoms of neuropathy and ensure adequate supplementation of vitamin deficiencies associated with neuropathy.  Current regimen:  Gabapentin '300mg'$  twice a day Biotin 1 tablet daily   Interventions:  None today, continue current therapy  Atrial Fibrillation:  Pharmacist Clinical Goal(s) Over the next 90 days, patient will work with PharmD and providers to identify potential programs to assist with Xarelto cost during coverage gap Current regimen:  Diltiazem '240mg'$  daily for heart rate (take each afternoon)  Xarelto '20mg'$  daily to prevent stroke  Interventions: Patient did not meet assistance program required 4% of income out of pocket spend in 2022. Will assess when reaches coverage gap in 2023.  Continue current medications for atrial fibrillation  Medication management Pharmacist Clinical Goal(s): Over the next 90 days, patient will work with PharmD and providers to maintain optimal medication adherence Current pharmacy: [facility] Interventions Comprehensive medication review performed. Continue current medication management strategy Focus on medication adherence by filling and taking medications appropriately  Take medications as prescribed Report any questions or concerns to PharmD  and/or provider(s)  Patient verbalizes understanding of instructions and care plan provided today and agrees to view in  [place]. Active MyChart status and patient understanding of how to access instructions and care plan via MyChart confirmed with patient.

## 2022-01-27 NOTE — Chronic Care Management (AMB) (Signed)
Chronic Care Management Pharmacy Note  01/27/2022 Name:  Brian Davidson MRN:  [id number] DOB:  [birth year]  Summary:  Patient reports he is doing well. Blood pressure has been 130's / 70 to 80 since losartan 35m daily was started. He is requesting Rx for 90 day supply for losartan. Rx sent.  Patient reports he has had 2 episodes of lightheadedness. One occurred yesterday between 10 and 11am. He was outside working. Discussed adequate hydration - suggested that he hydrate prior to working outside, during work and after to prevent dehydration. Also would have been that his blood glucose was a little low due to activity. Recommended he have a little snack prior to working outside - cheese with 4 or 5 crackers, peanut butter with crackers or apple, yogurt or half a sandwich.  Discussed cost of Xarelto - patient has not reached Medicare coverage gap this year yet but will plan to reapply for patient assistance program when he does. Family has to spend 4% of income out of pocket to qualify for Xarelto patient assistance program. Follow up planned in 3 months to review for patient assistance program.   Subjective: Brian HYUNis an 86y.o. year old male who is a primary patient of BMosie Lukes MD.  The CCM team was consulted for assistance with disease management and care coordination needs.    Engaged with patient by telephone for follow up visit in response to provider referral for pharmacy case management and/or care coordination services.   Consent to Services:  The patient was given information about Chronic Care Management services, agreed to services, and gave verbal consent prior to initiation of services.  Please see initial visit note for detailed documentation.   Patient Care Team: BMosie Lukes MD as PCP - General (Family Medicine) AThompson Grayer MD as PCP - Electrophysiology (Cardiology) VJettie Booze MD as PCP - Cardiology (Cardiology) AThompson Grayer MD as  Consulting Physician (Cardiology) RFranchot Mimes MD as Consulting Physician (Family Medicine) Armbruster, SCarlota Raspberry MD as Consulting Physician (Gastroenterology) AGaynelle Arabian MD as Consulting Physician (Orthopedic Surgery) ECherre Robins RWalnut(Pharmacist) MTyler Pita MD as Consulting Physician (Radiation Oncology) EFestus Aloe MD as Consulting Physician (Urology) CDelice BisonLCharlestine Massed NP as Nurse Practitioner (Hematology and Oncology) DHarmon Pier RN as Registered Nurse  Recent office visits: 12/07/2021 - Fam Med (Dr BCharlett Blake Preventative health visit. BP elevated - added losartan. Checked labs. Ordered renal ultrasound.  10/21/2021 - Fam Med ([place] Seen for altered taste. Felt like change in taste occurred after he stopped MVI and B1 supplement. Referred to ENT. 06/08/2021 - Fam Med (Dr BCharlett Blake Preventative health visit. Checked labs. B12 and B1 above normal. Recommended decreasing dose of B12 and B1. Recheck labs in 3 months.  03/16/2021 - PCP (Dr BCharlett Blake Video Visit for cough and congestion / f/u COVID. Recommended Mucinex bid for congestion and MVI with minerals daily. 11/24/2020 - PCP (Dr BCharlett Blake F/U chronic conditions. Labs checked. Med changes -Dr BCharlett Blakerecommended he consider adding daily Krill Oil Capsule and per lab notes to decrease vitamin B12 to 500 mg sublingual tablets take 1 tablet 3 x a week and then repeat labs in 3 months  Recent consult visits: 01/20/2022 - Vasc Surgery (Dr DScot Dock Initial consult for mid renal artery stenosis and mesenteric atrerial occlusive disease. 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 Since asymptomatic - no CT angiogram or further w/u needed. 09/28/2021 - Urology (Dr EJunious Silk treated with external beam radiation therapy for prostate  cancer for 5.5 weeks - completed 03/05/2021.  PSA checked and is decreasing. No medications changes. F/U planned in 3 months for renal ultrasound and cystoscopy. 08/13/21- Cardio (Dr Rayann Heman)  Seen for electrophysiology follow up. EKG checked. No med changes noted. F/U 12 months. . 08/04/21-(Podiatry) Yaakov Guthrie. Seen for corns and calluses.  03/05/2021 - completed radiation therapy for prostate cancer  Hospital visits: 01/05/2022 - ED at [facility]. Seen for suture removal.  Referral to reconstructive surgery given for management of facial fractures seen on prior imaging 12/26/2021 - ED [facility]. Seen for fall wiht fracture of facial bone and laceration of right hand. Received sutures to hand   Objective:  Lab Results  Component Value Date   CREATININE 1.44 01/10/2022   CREATININE 1.21 12/26/2021   CREATININE 1.25 12/07/2021    Lab Results  Component Value Date   HGBA1C 5.9 12/07/2021   Last diabetic Eye exam: No results found for: HMDIABEYEEXA  Last diabetic Foot exam: No results found for: HMDIABFOOTEX      Component Value Date/Time   CHOL 102 12/07/2021 1050   TRIG 75.0 12/07/2021 1050   HDL 42.40 12/07/2021 1050   CHOLHDL 2 12/07/2021 1050   VLDL 15.0 12/07/2021 1050   LDLCALC 45 12/07/2021 1050   LDLCALC 107 (H) 05/26/2020 1103       Latest Ref Rng & Units 01/10/2022    1:59 PM 12/07/2021   10:50 AM 10/21/2021   10:20 AM  Hepatic Function  Total Protein 6.0 - 8.3 g/dL 6.7   6.9   7.0    Albumin 3.5 - 5.2 g/dL 3.8   4.1   4.0    AST 0 - 37 U/L _0 ALT 0 - 53 U/L _1 Alk Phosphatase 39 - 117 U/L 101   82   85    Total Bilirubin 0.2 - 1.2 mg/dL 0.9   1.0   0.9      Lab Results  Component Value Date/Time   TSH 2.05 12/07/2021 10:50 AM   TSH 2.38 06/08/2021 10:03 AM       Latest Ref Rng & Units 12/26/2021   11:40 AM 10/21/2021   10:20 AM 06/08/2021   10:03 AM  CBC  WBC 4.0 - 10.5 K/uL 7.2   5.7   5.8    Hemoglobin 13.0 - 17.0 g/dL 13.9   13.7   13.6    Hematocrit 39.0 - 52.0 % 41.5   41.7   41.7    Platelets 150 - 400 K/uL 137   153.0   135.0      Lab Results  Component Value Date/Time   VD25OH  69.87 06/08/2021 10:03 AM    Clinical ASCVD: Yes  The ASCVD Risk score (Arnett DK, et al., 2019) failed to calculate for the following reasons:   The 2019 ASCVD risk score is only valid for ages 71 to 12     Social History   Tobacco Use  Smoking Status Former   Types: Pipe, Cigars   Quit date: 11/18/1985   Years since quitting: 36.2  Smokeless Tobacco Never   BP Readings from Last 3 Encounters:  01/20/22 (!) 168/76  01/10/22 138/74  01/05/22 (!) 151/70   Pulse Readings from Last 3 Encounters:  01/20/22 66  01/10/22 73  01/05/22 72   Wt Readings from Last 3 Encounters:  01/20/22 210 lb 12.8 oz (95.6  kg)  01/05/22 200 lb (90.7 kg)  12/26/21 207 lb (93.9 kg)    Assessment: Review of patient past medical history, allergies, medications, health status, including review of consultants reports, laboratory and other test data, was performed as part of comprehensive evaluation and provision of chronic care management services.   SDOH:  (Social Determinants of Health) assessments and interventions performed:  SDOH Interventions    Flowsheet Row Most Recent Value  SDOH Interventions   Financial Strain Interventions Other (Comment)  [patient did not qualify for patient assistance program for Xalreto in 2022, plan to retry when reaches coverage gap in 2023.]        CCM Care Plan  Allergies  Allergen Reactions   Cardizem [Diltiazem]     Rash from Yellow dye in generic capsule.   Can take different brand. Takes diltiazem - his allergy is to Cardizem   Sulfa Antibiotics     Unknown childhood reaction    Medications Reviewed Today     Reviewed by Cherre Robins, RPH-CPP (Pharmacist) on 01/27/22 at [place] List Status: <None>   Medication Order Taking? Sig Documenting Provider Last Dose Status Informant  Calcium Carbonate-Vitamin D (CALTRATE 600+D PO) [id number] Yes Take 600 mg by mouth daily.  [provider] Taking Active Self  Cholecalciferol 50 MCG (2000 UT)  TABS [id number] Yes Take 2,000 Units by mouth daily.  [provider] Taking Active Self  diltiazem (CARDIZEM CD) 240 MG 24 hr capsule [id number] Yes TAKE ONE (1) CAPSULE EACH DAY BY MOUTH Mosie Lukes, MD Taking Active   fluticasone (FLONASE) 50 MCG/ACT nasal spray [id number] Yes Use 2 spray(s) in each nostril once daily Mosie Lukes, MD Taking Active   gabapentin (NEURONTIN) 300 MG capsule [id number] Yes Take 1 capsule by mouth twice daily Mosie Lukes, MD Taking Active   losartan (COZAAR) 25 MG tablet [id number] Yes Take 1 tablet (25 mg total) by mouth daily. Mosie Lukes, MD Taking Active   metoprolol tartrate (LOPRESSOR) 100 MG tablet [id number] Yes Take 1 tablet by mouth twice daily Mosie Lukes, MD Taking Active   Multiple Vitamins-Minerals (MULTIVITAMIN WITH MINERALS) tablet [id number] Yes Take 1 tablet by mouth daily. Mosie Lukes, MD Taking Active   rosuvastatin (CRESTOR) 10 MG tablet [id number] Yes Take 1 tablet (10 mg total) by mouth daily. Jettie Booze, MD Taking Active   Turmeric 500 MG TABS [id number] Yes Take 500 mg by mouth daily.  [provider] Taking Active Self  XARELTO 20 MG TABS tablet [id number] Yes Take 1 tablet by mouth once daily Mosie Lukes, MD Taking Active   Med List Note Starleen Arms 05/27/15 1156):               Patient Active Problem List   Diagnosis Date Noted   DPOEU-23 03/17/2021   Hyperglycemia 11/24/2020   Malignant neoplasm of prostate ([place]) 11/24/2020   Thoracic aortic aneurysm ([place]) 05/27/2020   Primary osteoarthritis of right knee 12/23/2019   Symptomatic bradycardia 05/31/2019   Vertigo 07/27/2018   Multiple closed fractures of ribs of right side 01/21/2018   Thiamine deficiency 01/19/2018   Peripheral neuropathy 07/13/2017   Hyperlipidemia 07/13/2017   Preventative health care 01/11/2017   Carotid artery disease ([place]) 07/01/2016   Mitral valve disorder 07/01/2016   Low back pain  12/31/2015   Osteopenia 12/31/2015   Allergic state 12/31/2015   Skin lesion 12/31/2015   History of chicken pox    Hematuria 04/07/2015  Calculi, ureter 03/30/2015   OA (osteoarthritis) of knee 05/10/2014   Pacemaker-St.Jude 02/01/2012   Permanent atrial fibrillation ([place])    Hypertension    Heart murmur    Chronic anticoagulation     Immunization History  Administered Date(s) Administered   Influenza, High Dose Seasonal PF 06/18/2017, 06/11/2018, 04/29/2019, 04/28/2020, 05/10/2021   Influenza-Unspecified 06/11/2016   PFIZER(Purple Top)SARS-COV-2 Vaccination 09/24/2019, 10/10/2019, 05/30/2020, 04/22/2021   Pfizer Covid-19 Vaccine Bivalent Booster 48yr & up 05/29/2021   Pneumococcal Conjugate-13 07/20/2015   Pneumococcal Polysaccharide-23 09/05/2016   Tdap 11/14/2014, 05/29/2019   Zoster Recombinat (Shingrix) 12/26/2016, 03/21/2017    Conditions to be addressed/monitored: Atrial Fibrillation, CAD, HTN, HLD and pre DM; prostate cancer (active) neuropathy  Care Plan : General Pharmacy (Adult)  Updates made by ECherre Robins RPH-CPP since 01/27/2022 12:00 AM     Problem: Chronic Disease Management support, education, and care coordination needs related to Hypertension, Hyperlipidemia/CAD, AFib, Osteopenia, Neuropathy/Pain   Priority: High  Onset Date: 01/19/2021  Note:   Current Barriers:  Unable to independently afford treatment regimen Unable to achieve control of lipids   Chronic Disease Management support, education, and care coordination needs related to Hypertension, Hyperlipidemia/CAD, AFib, Osteopenia, Neuropathy/Pain  Pharmacist Clinical Goal(s):  Over the next 180 days, patient will verbalize ability to afford treatment regimen achieve control of lipids as evidenced by LDL <70 maintain control of blood pressure / hypertension  as evidenced by blood pressure  <130/80  through collaboration with PharmD and provider.   Interventions: 1:1 collaboration with BMosie Lukes MD regarding development and update of comprehensive plan of care as evidenced by provider attestation and co-signature Inter-disciplinary care team collaboration (see longitudinal plan of care) Comprehensive medication review performed; medication list updated in electronic medical record   Hypertension / Thoracic Aortic Aneurysm BP improving; goal is to maintain BP goal <130/80 Patient stopped irbesartan-HCTZ due to dizziness - per Dr VIrish Lackin early 2022 Denies dizziness since changed diltiazem to take in the afternoon  Current regimen:  Metoprolol tartrate 1067mtwice daily Diltiazem 24061maily each afternoon Losartan 40m37mily (started 12/2021) Interventions:.  Discussed blood pressure goals Continue to check blood pressure 2 to 3 times per week and record Ensure daily salt intake < 2300 mg/day Continue current medications Updated prescription for losartan to get 90 day supply per patient request.  Hyperlipidemia At goal; LDL goal < 70 Current regimen:  Rosuvastatin 10mg69mly  Interventions: Discussed LDL goal; since started rosuvastatin LDL is now at goal Continue to take rosuvastatin daily Recommend at least 150 minutes of exercise per week  Osteopenia Current regimen:  Calcium 600mg 30my Vitamin D 2000 units daily  DEXA 09/07/2021:  Neck of femur -1.5 (previous was -1.6 on 01/25/2018) Total femur -0.6 (previous was -0.4 on 01/25/2018) Forearm Radius 33% +0.5 (no previous value) Interventions:  Recommended maintain osteopenia medication regimen   Neuropathy Per patient neuropathic is controlled with current therapy Last B12 was elevated at 1048 (06/08/2021) Last B1 was lower but still above normal at 34 (06/08/2021)  Patient was advised by PCP to lower dose of B12 and B1 or stop both supplements. B12 and B1 checked in the last 4 months and both were normal.  Current regimen:  Gabapentin 300mg t34m a day Biotin 1 tablet daily    Interventions:   Updated medication list to remove vitamin B12 and B1 supplements   Atrial Fibrillation:  HR controlled since last visit; patient is taking stroke prevention therapy daily CHADS2VASc score: 4 Current regimen:  Diltiazem 218m daily for heart rate  Xarelto 271mdaily to prevent stroke  Interventions: Applied for medication assistance program for Xarelto in August 2022 but patient did not qualify.  Will continue to follow, has to reach coverage gap and will reapply for 2023 - he should qualify based on reported income today. Reviewed last SCr and eGFR - last eGFR was 52 based on Scr from 01/10/2022. Dose of Xarelto currently appropriate. If eGFR drops to < 50 should consider lower Xarelto dose of 1578maily .  Continue current medications for atrial fibrillation  Pre-Diabetes: Controlled; A1c goal is <6.5% Current treatment: Diet and exercise  Does not check BG at home Usually exercises 5 to 7 days per week. Will take off during radiation therapy that will start 01/26/21.  Interventions:  Continue to limiting sugar and carbohydrate intake Continue as planned to restart exercise after upcoming radiation therapy.   Medication management Pharmacist Clinical Goal(s): Over the next 90 days, patient will work with PharmD and providers to maintain optimal medication adherence Current pharmacy: [facility] (gets only diltiazem at [place]) Interventions Comprehensive medication review performed. Continue current medication management strategy  Patient Goals/Self-Care Activities Over the next 180 days, patient will:  take medications as prescribed,  check blood pressure 2 to 3 times per week, document, and provide at future appointments, and  collaborate with provider on medication access solutions  Follow Up Plan: Telephone follow up appointment with care management team member scheduled for:    3 to 4 months.      Medication Assistance: Applied for medication assistance program for  Xarelto. Patient did not meet 4% of income out of pocket spend.   [facility] [place] [place] [id number]: [phone]: [phone] Psychiatric Centerrket [address]iCleveland HeightsC [id number]gAmesti [id number]: [phone]: [phone] [place]IC[place] HICPaint[address]one: [phone]: [phone] Follow Up:  Patient agrees to Care Plan and Follow-up.  Plan: Telephone follow up appointment with care management team member scheduled for:   3 months  TamCherre RobinsharmD Clinical Pharmacist LeBWisdomdCalabashgDha Endoscopy LLC

## 2022-02-01 DIAGNOSIS — M25561 Pain in right knee: Secondary | ICD-10-CM | POA: Diagnosis not present

## 2022-02-02 DIAGNOSIS — I10 Essential (primary) hypertension: Secondary | ICD-10-CM

## 2022-02-02 DIAGNOSIS — I712 Thoracic aortic aneurysm, without rupture, unspecified: Secondary | ICD-10-CM

## 2022-02-02 DIAGNOSIS — Z87891 Personal history of nicotine dependence: Secondary | ICD-10-CM

## 2022-02-02 DIAGNOSIS — M8580 Other specified disorders of bone density and structure, unspecified site: Secondary | ICD-10-CM

## 2022-02-02 DIAGNOSIS — R3121 Asymptomatic microscopic hematuria: Secondary | ICD-10-CM | POA: Diagnosis not present

## 2022-02-02 DIAGNOSIS — E785 Hyperlipidemia, unspecified: Secondary | ICD-10-CM

## 2022-02-02 DIAGNOSIS — I4891 Unspecified atrial fibrillation: Secondary | ICD-10-CM | POA: Diagnosis not present

## 2022-02-09 ENCOUNTER — Other Ambulatory Visit: Payer: Self-pay | Admitting: Thoracic Surgery (Cardiothoracic Vascular Surgery)

## 2022-02-09 DIAGNOSIS — M79671 Pain in right foot: Secondary | ICD-10-CM | POA: Diagnosis not present

## 2022-02-09 DIAGNOSIS — B351 Tinea unguium: Secondary | ICD-10-CM | POA: Diagnosis not present

## 2022-02-09 DIAGNOSIS — I712 Thoracic aortic aneurysm, without rupture, unspecified: Secondary | ICD-10-CM

## 2022-02-09 DIAGNOSIS — L84 Corns and callosities: Secondary | ICD-10-CM | POA: Diagnosis not present

## 2022-02-09 DIAGNOSIS — M79672 Pain in left foot: Secondary | ICD-10-CM | POA: Diagnosis not present

## 2022-02-10 ENCOUNTER — Other Ambulatory Visit: Payer: Self-pay | Admitting: Family Medicine

## 2022-02-17 DIAGNOSIS — Z96651 Presence of right artificial knee joint: Secondary | ICD-10-CM | POA: Diagnosis not present

## 2022-02-21 ENCOUNTER — Other Ambulatory Visit: Payer: Self-pay | Admitting: Family Medicine

## 2022-02-23 ENCOUNTER — Other Ambulatory Visit: Payer: Self-pay | Admitting: Family Medicine

## 2022-03-02 ENCOUNTER — Ambulatory Visit (INDEPENDENT_AMBULATORY_CARE_PROVIDER_SITE_OTHER): Payer: PPO

## 2022-03-02 DIAGNOSIS — I495 Sick sinus syndrome: Secondary | ICD-10-CM | POA: Diagnosis not present

## 2022-03-03 LAB — CUP PACEART REMOTE DEVICE CHECK
Battery Remaining Longevity: 46 mo
Battery Remaining Percentage: 33 %
Battery Voltage: 2.93 V
Brady Statistic RV Percent Paced: 17 %
Date Time Interrogation Session: 20230628034646
Implantable Lead Implant Date: 20130528
Implantable Lead Location: 753860
Implantable Lead Model: 1948
Implantable Pulse Generator Implant Date: 20130528
Lead Channel Impedance Value: 640 Ohm
Lead Channel Pacing Threshold Amplitude: 0.75 V
Lead Channel Pacing Threshold Pulse Width: 0.4 ms
Lead Channel Sensing Intrinsic Amplitude: 12 mV
Lead Channel Setting Pacing Amplitude: 2.5 V
Lead Channel Setting Pacing Pulse Width: 0.4 ms
Lead Channel Setting Sensing Sensitivity: 2 mV
Pulse Gen Model: 1210
Pulse Gen Serial Number: 7328888

## 2022-03-11 DIAGNOSIS — J3489 Other specified disorders of nose and nasal sinuses: Secondary | ICD-10-CM | POA: Diagnosis not present

## 2022-03-11 DIAGNOSIS — R439 Unspecified disturbances of smell and taste: Secondary | ICD-10-CM | POA: Diagnosis not present

## 2022-03-11 DIAGNOSIS — R432 Parageusia: Secondary | ICD-10-CM | POA: Diagnosis not present

## 2022-03-22 NOTE — Progress Notes (Signed)
Remote pacemaker transmission.   

## 2022-03-22 NOTE — Progress Notes (Signed)
Cardiology Office Note   Date:  03/23/2022   [name], DOB 04-23-1936, [name]  PCP:  Brian Canary, MD    No chief complaint on file.  AFib/pacer  Wt Readings from Last 3 Encounters:  03/23/22 208 lb (94.3 kg)  01/20/22 210 lb 12.8 oz (95.6 kg)  01/05/22 200 lb (90.7 kg)       History of Present Illness: Brian Davidson is a 86 y.o. male  With a pacemaker, followed by Brian. Johney Davidson.  He saw Brian. Shelva Davidson 20 years ago for AFib.  He had cardioversion many years ago and was treated with Coumadin.  THis was later switched to Xarelto.   He is referred to me by EP PA, Brian Davidson for :"I wanted to establish him with gen cards for management of his non EP issues (HTN, carotid artery disease, moderate ascending aorta dilation, and moderate mitral stenosis) since he has been stable from a PPM/AFib standpoint and only requires annual follow up at this time    Echo in 05/2019: "Left ventricular ejection fraction, by visual estimation, is 60 to  65%. The left ventricle has normal function. Normal left ventricular size.  There is no left ventricular hypertrophy.   2. Left ventricular diastolic function could not be evaluated pattern of  LV diastolic filling.   3. Global right ventricle has normal systolic function.The right  ventricular size is normal. No increase in right ventricular wall  thickness.   4. Left atrial size was moderately dilated.   5. Right atrial size was normal.   6. The mitral valve is normal in structure. No evidence of mitral valve  regurgitation. Moderate mitral stenosis.   7. The tricuspid valve is normal in structure. Tricuspid valve  regurgitation is mild.   8. The aortic valve is normal in structure. Aortic valve regurgitation is  mild by color flow Doppler. Mild aortic valve sclerosis without stenosis.   9. The pulmonic valve was normal in structure. Pulmonic valve  regurgitation is not visualized by color flow Doppler.  10. There is  moderate dilatation of the ascending aorta measuring 43 mm.  11. Normal pulmonary artery systolic pressure.  12. The inferior vena cava is normal in size with greater than 50%  respiratory variability, suggesting right atrial pressure of 3 mmHg. "   In 2021, he reported :" that when he stands from a sitting position, he gets dizzy.  He holds a wall and is ok after a few seconds. Walks 2.5 miles daily.  Exercise tolerance better after pacer. "   Had knee replacement on 4/19.   He had both COVID shots in 2021.    Aortic aneurysm 4.4 cm at last check.  Walks 2.5 miles,  4 times a week.  Denies : exertional Chest pain. Dizziness. Leg edema. Nitroglycerin use. Orthopnea. Palpitations. Paroxysmal nocturnal dyspnea. Shortness of breath. Syncope.    Had a fall on 01/05/22. PM interrogation was negative.    Past Medical History:  Diagnosis Date   Arthritis    Biliary dyskinesia    Carotid artery disease (HCC) 07/01/2016   Dyslipidemia 07/13/2017   Dysrhythmia    a fib   GERD (gastroesophageal reflux disease)    occasional   H/O measles    History of chicken pox    History of kidney stones    Hypertension    Neuromuscular disorder (HCC)    neuropathy feet    Persistent atrial fibrillation (HCC)    Pneumonia 1992  Presence of permanent cardiac pacemaker    Prostate cancer (HCC)    Tachycardia-bradycardia (HCC)    a. s/p STJ dual chamber pacemaker   Thiamine deficiency 01/19/2018   Valvular heart disease 07/01/2016    Past Surgical History:  Procedure Laterality Date   CHOLECYSTECTOMY N/A 11/24/2015   Procedure: LAPAROSCOPIC CHOLECYSTECTOMY;  Surgeon: Brian Adu, MD;  Location: [facility];  Service: General;  Laterality: N/A;   CYSTOSCOPY  2016   EYE SURGERY     bil cataract   FRACTURE SURGERY  2005   left ankle   HERNIA REPAIR     left groin, no mesh   PERMANENT PACEMAKER INSERTION N/A 01/31/2012   SJM Accent SR RF implanted by Brian Davidson   TOTAL KNEE ARTHROPLASTY Right 12/23/2019    Procedure: TOTAL KNEE ARTHROPLASTY;  Surgeon: Brian Gross, MD;  Location: [facility];  Service: Orthopedics;  Laterality: Right;      Current Outpatient Medications  Medication Sig Dispense Refill   Calcium Carbonate-Vitamin D (CALTRATE 600+D PO) Take 600 mg by mouth daily.      Cholecalciferol 50 MCG (2000 UT) TABS Take 2,000 Units by mouth daily.      diltiazem (CARDIZEM CD) 240 MG 24 hr capsule TAKE ONE (1) CAPSULE EACH DAY BY MOUTH 90 capsule 1   fluticasone (FLONASE) 50 MCG/ACT nasal spray Use 2 spray(s) in each nostril once daily 16 g 5   gabapentin (NEURONTIN) 300 MG capsule Take 1 capsule by mouth twice daily 180 capsule 0   losartan (COZAAR) 25 MG tablet Take 1 tablet (25 mg total) by mouth daily. 90 tablet 1   metoprolol tartrate (LOPRESSOR) 100 MG tablet Take 1 tablet by mouth twice daily 180 tablet 1   Multiple Vitamins-Minerals (MULTIVITAMIN WITH MINERALS) tablet Take 1 tablet by mouth daily. 30 tablet 3   rosuvastatin (CRESTOR) 10 MG tablet Take 1 tablet (10 mg total) by mouth daily. 90 tablet 0   Turmeric 500 MG TABS Take 500 mg by mouth daily.      XARELTO 20 MG TABS tablet Take 1 tablet by mouth once daily 90 tablet 0   No current facility-administered medications for this visit.    Allergies:   Cardizem [diltiazem] and Sulfa antibiotics    Social History:  The patient  reports that he quit smoking about 36 years ago. His smoking use included pipe and cigars. He has never used smokeless tobacco. He reports current alcohol use of about 3.0 standard drinks of alcohol per week. He reports that he does not use drugs.   Family History:  The patient's family history includes Arthritis in his mother; Drug abuse in his mother; Heart disease in his brother, father, and sister; Heart disease (age of onset: 42) in his sister; Hypertension in his daughter and mother; Neuropathy in his brother and mother; Prostate cancer in his father; Stroke in his father.    ROS:  Please  see the history of present illness.   Otherwise, review of systems are positive for some unsteadiness on his feet.   All other systems are reviewed and negative.    PHYSICAL EXAM: VS:  BP 130/70 (BP Location: Left Arm, Patient Position: Sitting, Cuff Size: Normal)   Pulse 78   Ht 6' (1.829 m)   Wt 208 lb (94.3 kg)   SpO2 96%   BMI 28.21 kg/m  , BMI Body mass index is 28.21 kg/m. GEN: Well nourished, well developed, in no acute distress HEENT: normal Neck: no JVD, carotid bruits,  or masses Cardiac: irregularly irregular; no murmurs, rubs, or gallops,no edema  Respiratory:  clear to auscultation bilaterally, normal work of breathing GI: soft, nontender, nondistended, + BS MS: no deformity or atrophy; varicose veins Skin: warm and dry, no rash Neuro:  Strength and sensation are intact Psych: euthymic mood, full affect   EKG:   The ekg ordered 4/23 demonstrates A-fib, rate controlled   Recent Labs: 12/07/2021: TSH 2.05 12/26/2021: B Natriuretic Peptide 325.0; Hemoglobin 13.9; Platelets 137 01/10/2022: ALT 19; BUN 22; Creatinine, Ser 1.44; Potassium 4.0; Sodium 139   Lipid Panel    Component Value Date/Time   CHOL 102 12/07/2021 1050   TRIG 75.0 12/07/2021 1050   HDL 42.40 12/07/2021 1050   CHOLHDL 2 12/07/2021 1050   VLDL 15.0 12/07/2021 1050   LDLCALC 45 12/07/2021 1050   LDLCALC 107 (H) 05/26/2020 1103     Other studies Reviewed: Additional studies/ records that were reviewed today with results demonstrating: labs reviewed; cr 1.44.  Hbg 13.9   ASSESSMENT AND PLAN:  Dilated aortic root: F/u CT scan is planned Has f/u with [facility]CTS as well.   Pacer: followed by EP.  No cardiac cause of fall detected when pacemaker was interrogated in the ER in April 2023 Hyperlipidemia: April 2023 HDL 42 LDL 45 triglycerides 75 total cholesterol 102 Atrial fibrillation: Rate controlled. No palpitations.  Mitral stenosis: No obvious heart failure.  PreDM: high fiber diet.  Anticoagulated:  Tolerating Xarelto.  Use the cane if  feeling unstable.  We talked about the importance of preventing falls. HTN: The current medical regimen is effective;  continue present plan and medications.   Current medicines are reviewed at length with the patient today.  The patient concerns regarding his medicines were addressed.  The following changes have been made:  No change  Labs/ tests ordered today include:  No orders of the defined types were placed in this encounter.   Recommend 150 minutes/week of aerobic exercise Low fat, low carb, high fiber diet recommended  Disposition:   FU in 1 year   Signed, Lance Muss, MD  03/23/2022 5:00 PM    [facility] [address] [place], [place], [place]  [address] Phone: [phone]; Fax: [phone]

## 2022-03-23 ENCOUNTER — Ambulatory Visit: Payer: PPO | Admitting: Interventional Cardiology

## 2022-03-23 ENCOUNTER — Encounter: Payer: Self-pay | Admitting: Interventional Cardiology

## 2022-03-23 VITALS — BP 130/70 | HR 78 | Ht 72.0 in | Wt 208.0 lb

## 2022-03-23 DIAGNOSIS — E782 Mixed hyperlipidemia: Secondary | ICD-10-CM | POA: Diagnosis not present

## 2022-03-23 DIAGNOSIS — Z950 Presence of cardiac pacemaker: Secondary | ICD-10-CM | POA: Diagnosis not present

## 2022-03-23 DIAGNOSIS — Z7901 Long term (current) use of anticoagulants: Secondary | ICD-10-CM

## 2022-03-23 DIAGNOSIS — I779 Disorder of arteries and arterioles, unspecified: Secondary | ICD-10-CM

## 2022-03-23 DIAGNOSIS — I10 Essential (primary) hypertension: Secondary | ICD-10-CM

## 2022-03-23 DIAGNOSIS — I4821 Permanent atrial fibrillation: Secondary | ICD-10-CM

## 2022-03-23 NOTE — Patient Instructions (Signed)

## 2022-03-27 ENCOUNTER — Other Ambulatory Visit: Payer: Self-pay | Admitting: Interventional Cardiology

## 2022-03-28 DIAGNOSIS — L03032 Cellulitis of left toe: Secondary | ICD-10-CM | POA: Diagnosis not present

## 2022-03-28 DIAGNOSIS — L89892 Pressure ulcer of other site, stage 2: Secondary | ICD-10-CM | POA: Diagnosis not present

## 2022-03-29 ENCOUNTER — Ambulatory Visit
Admission: RE | Admit: 2022-03-29 | Discharge: 2022-03-29 | Disposition: A | Discharge status: Home / Self Care | Payer: PPO | Source: Ambulatory Visit | Attending: Thoracic Surgery (Cardiothoracic Vascular Surgery) | Admitting: Thoracic Surgery (Cardiothoracic Vascular Surgery)

## 2022-03-29 ENCOUNTER — Ambulatory Visit: Payer: PPO | Admitting: Surgical

## 2022-03-29 VITALS — BP 125/69 | HR 80 | Resp 20 | Ht 72.0 in | Wt 206.0 lb

## 2022-03-29 DIAGNOSIS — I7121 Aneurysm of the ascending aorta, without rupture: Secondary | ICD-10-CM

## 2022-03-29 DIAGNOSIS — I712 Thoracic aortic aneurysm, without rupture, unspecified: Secondary | ICD-10-CM

## 2022-03-29 DIAGNOSIS — I3139 Other pericardial effusion (noninflammatory): Secondary | ICD-10-CM | POA: Diagnosis not present

## 2022-03-29 NOTE — Progress Notes (Signed)
Subjective:     Patient ID: Brian Davidson, male    DOB: 1936/04/08, 85 y.o.   MRN: [id number]  Chief Complaint  Patient presents with   Thoracic Aortic Aneurysm    1 year f/u with Chest CT    HPI Patient is in today for for routine follow-up of his thoracic aortic aneurysm.  Last seen by Dr. Julien Girt in July 2022.  At that time the aneurysm measured 4.3 cm in greatest diameter.  The aortic root measured 4.3 to 4.5 cm at the level of the sinus of Valsalva.  This was a stable exam.  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  He underwent repeat scan today and the full report as described below.  The measurements are quite stable and he remains asymptomatic in regards to any chest pain.  He does have occasional dizziness that he relates to getting up too quickly and has had an occasional fall.  He does have known chronic persistent atrial fibrillation.  ROS: Per HPI, otherwise unremarkable     Objective:    Ht 6' (1.829 m)   BMI 28.21 kg/m  BP Readings from Last 3 Encounters:  03/29/22 125/69  03/23/22 130/70  01/20/22 (!) 168/76   Wt Readings from Last 3 Encounters:  03/29/22 206 lb (93.4 kg)  03/23/22 208 lb (94.3 kg)  01/20/22 210 lb 12.8 oz (95.6 kg)      Physical Exam Constitutional:      General: He is not in acute distress.    Appearance: Normal appearance. He is normal weight. He is not ill-appearing.  HENT:     Head: Normocephalic and atraumatic.  Cardiovascular:     Rate and Rhythm: Normal rate. Rhythm irregular.     Pulses: Normal pulses.     Heart sounds: No murmur heard.    No friction rub. No gallop.     Comments: Bilateral varicose veins, lower extremities Pulmonary:     Effort: Pulmonary effort is normal.     Breath sounds: Normal breath sounds.  Abdominal:     General: Bowel sounds  are normal.     Palpations: Abdomen is soft.     Tenderness: There is no abdominal tenderness.  Musculoskeletal:     Right lower leg: Edema present.     Left lower leg: Edema present.  Skin:    General: Skin is warm and dry.     Coloration: Skin is not jaundiced or pale.  Neurological:     General: No focal deficit present.     Mental Status: He is alert.  Psychiatric:        Mood and Affect: Mood normal.        Behavior: Behavior normal.        Thought Content: Thought content normal.     No results found for any visits on 03/29/22. Narrative & Impression  CLINICAL DATA:  Follow-up aortic aneurysm   EXAM: CT CHEST WITHOUT CONTRAST   TECHNIQUE: Multidetector CT imaging of the chest was performed following the standard protocol without IV contrast.   RADIATION DOSE REDUCTION: This exam was performed according to the departmental dose-optimization program which includes automated exposure control, adjustment of the mA and/or kV according to patient size and/or use of iterative reconstruction technique.   COMPARISON:  Chest CT dated March 30, 2021   FINDINGS: Cardiovascular: Normal  heart size. Trace pericardial effusion. Severe left main and three-vessel coronary artery calcifications. Aortic root measures up to 4.2 cm, unchanged when compared with prior exam and remeasured in similar plane. Ascending thoracic aorta measuring 4.3 x 4.3 cm, unchanged. Normal caliber aortic arch and descending thoracic aorta. Severe calcific atherosclerotic disease. Partially pacer lead with tip in the right ventricle. Stable dilation of the main pulmonary artery, measuring up to 4.4 cm.   Mediastinum/Nodes: Esophagus is unremarkable. Thyroid is unremarkable. No pathologically enlarged nodes seen in the chest.   Lungs/Pleura: Central airways are patent. No consolidation or pneumothorax. Trace left pleural effusion.   Upper Abdomen: Cholecystectomy clips.  No acute abnormality.    Musculoskeletal: Subacute appearing left lateral seventh rib fracture. No aggressive appearing osseous lesions.   IMPRESSION: 1. Stable mildly dilated aortic root, measuring up to 4.2 cm and mildly dilated ascending thoracic aorta measuring up to 4.3 cm. Recommend annual imaging followup by CTA or MRA. This recommendation follows 2010 ACCF/AHA/AATS/ACR/ASA/SCA/SCAI/SIR/STS/SVM Guidelines for the Diagnosis and Management of Patients with Thoracic Aortic Disease. Circulation. 2010; 121: W888-B169. Aortic aneurysm NOS (ICD10-I71.9) 2. Trace left pleural effusion. 3. Aortic Atherosclerosis (ICD10-I70.0).     Electronically Signed   By: Yetta Glassman M.D.   On: 03/29/2022 12:44         Assessment & Plan:   Problem List Items Addressed This Visit     Thoracic aortic aneurysm ([place]) - Primary    No orders of the defined types were placed in this encounter.  A/P: The patient is very stable in regards to his small thoracic aortic aneurysm.  The size is unchanged from previous scan last year.  He will continue ongoing cardiology management of his atrial fibrillation.  He sees vascular surgery in regards to his carotid disease.  His blood pressure is under good control and we discussed lifestyle, nutrition and medical management of risk factors.  He is very active for his age.  We will see him in 1 year with a repeat CTA of his chest and consideration may be needed as to how long we need to continue to follow this as he ages out of potential surgery due to to the risk/benefits. No follow-ups on file.  John Giovanni, PA-C

## 2022-03-29 NOTE — Patient Instructions (Signed)
Continue lifestyle, nutrition and medical management of chronic conditions as discussed

## 2022-04-01 ENCOUNTER — Other Ambulatory Visit: Payer: Self-pay | Admitting: Family Medicine

## 2022-04-07 DIAGNOSIS — B351 Tinea unguium: Secondary | ICD-10-CM | POA: Diagnosis not present

## 2022-04-07 DIAGNOSIS — M79672 Pain in left foot: Secondary | ICD-10-CM | POA: Diagnosis not present

## 2022-04-07 DIAGNOSIS — L89892 Pressure ulcer of other site, stage 2: Secondary | ICD-10-CM | POA: Diagnosis not present

## 2022-04-07 DIAGNOSIS — M79671 Pain in right foot: Secondary | ICD-10-CM | POA: Diagnosis not present

## 2022-04-09 ENCOUNTER — Emergency Department (HOSPITAL_BASED_OUTPATIENT_CLINIC_OR_DEPARTMENT_OTHER)
Admission: EM | Admit: 2022-04-09 | Discharge: 2022-04-09 | Disposition: A | Discharge status: Home / Self Care | Payer: PPO | Attending: Emergency Medicine | Admitting: Emergency Medicine

## 2022-04-09 ENCOUNTER — Encounter (HOSPITAL_BASED_OUTPATIENT_CLINIC_OR_DEPARTMENT_OTHER): Payer: Self-pay | Admitting: Emergency Medicine

## 2022-04-09 ENCOUNTER — Other Ambulatory Visit: Payer: Self-pay

## 2022-04-09 ENCOUNTER — Emergency Department (HOSPITAL_BASED_OUTPATIENT_CLINIC_OR_DEPARTMENT_OTHER): Payer: PPO

## 2022-04-09 DIAGNOSIS — K529 Noninfective gastroenteritis and colitis, unspecified: Secondary | ICD-10-CM | POA: Diagnosis not present

## 2022-04-09 DIAGNOSIS — Z7901 Long term (current) use of anticoagulants: Secondary | ICD-10-CM | POA: Insufficient documentation

## 2022-04-09 DIAGNOSIS — R1030 Lower abdominal pain, unspecified: Secondary | ICD-10-CM

## 2022-04-09 DIAGNOSIS — R1032 Left lower quadrant pain: Secondary | ICD-10-CM | POA: Diagnosis not present

## 2022-04-09 DIAGNOSIS — R109 Unspecified abdominal pain: Secondary | ICD-10-CM | POA: Diagnosis present

## 2022-04-09 DIAGNOSIS — R1031 Right lower quadrant pain: Secondary | ICD-10-CM | POA: Diagnosis not present

## 2022-04-09 DIAGNOSIS — I700 Atherosclerosis of aorta: Secondary | ICD-10-CM | POA: Diagnosis not present

## 2022-04-09 DIAGNOSIS — R197 Diarrhea, unspecified: Secondary | ICD-10-CM

## 2022-04-09 DIAGNOSIS — I10 Essential (primary) hypertension: Secondary | ICD-10-CM | POA: Diagnosis not present

## 2022-04-09 LAB — COMPREHENSIVE METABOLIC PANEL
ALT: 13 U/L (ref 0–44)
AST: 21 U/L (ref 15–41)
Albumin: 3.1 g/dL — ABNORMAL LOW (ref 3.5–5.0)
Alkaline Phosphatase: 76 U/L (ref 38–126)
Anion gap: 5 (ref 5–15)
BUN: 20 mg/dL (ref 8–23)
CO2: 22 mmol/L (ref 22–32)
Calcium: 8.4 mg/dL — ABNORMAL LOW (ref 8.9–10.3)
Chloride: 109 mmol/L (ref 98–111)
Creatinine, Ser: 1.33 mg/dL — ABNORMAL HIGH (ref 0.61–1.24)
GFR, Estimated: 53 mL/min — ABNORMAL LOW (ref >60)
Glucose, Bld: 121 mg/dL — ABNORMAL HIGH (ref 70–99)
Potassium: 4.2 mmol/L (ref 3.5–5.1)
Sodium: 136 mmol/L (ref 135–145)
Total Bilirubin: 0.9 mg/dL (ref 0.3–1.2)
Total Protein: 6.5 g/dL (ref 6.5–8.1)

## 2022-04-09 LAB — CBC WITH DIFFERENTIAL/PLATELET
Abs Immature Granulocytes: 0.03 10*3/uL (ref 0.00–0.07)
Basophils Absolute: 0 10*3/uL (ref 0.0–0.1)
Basophils Relative: 0 %
Eosinophils Absolute: 0.1 10*3/uL (ref 0.0–0.5)
Eosinophils Relative: 1 %
HCT: 40.8 % (ref 39.0–52.0)
Hemoglobin: 13.4 g/dL (ref 13.0–17.0)
Immature Granulocytes: 0 %
Lymphocytes Relative: 9 %
Lymphs Abs: 0.8 10*3/uL (ref 0.7–4.0)
MCH: 29.6 pg (ref 26.0–34.0)
MCHC: 32.8 g/dL (ref 30.0–36.0)
MCV: 90.3 fL (ref 80.0–100.0)
Monocytes Absolute: 1.1 10*3/uL — ABNORMAL HIGH (ref 0.1–1.0)
Monocytes Relative: 12 %
Neutro Abs: 7.2 10*3/uL (ref 1.7–7.7)
Neutrophils Relative %: 78 %
Platelets: 167 10*3/uL (ref 150–400)
RBC: 4.52 MIL/uL (ref 4.22–5.81)
RDW: 14.3 % (ref 11.5–15.5)
WBC: 9.3 10*3/uL (ref 4.0–10.5)
nRBC: 0 % (ref 0.0–0.2)

## 2022-04-09 LAB — URINALYSIS, ROUTINE W REFLEX MICROSCOPIC
Bilirubin Urine: NEGATIVE
Glucose, UA: NEGATIVE mg/dL
Ketones, ur: NEGATIVE mg/dL
Leukocytes,Ua: NEGATIVE
Nitrite: NEGATIVE
Protein, ur: NEGATIVE mg/dL
Specific Gravity, Urine: 1.01 (ref 1.005–1.030)
pH: 6.5 (ref 5.0–8.0)

## 2022-04-09 LAB — LIPASE, BLOOD: Lipase: 38 U/L (ref 11–51)

## 2022-04-09 LAB — URINALYSIS, MICROSCOPIC (REFLEX)

## 2022-04-09 LAB — MAGNESIUM: Magnesium: 1.9 mg/dL (ref 1.7–2.4)

## 2022-04-09 MED ORDER — SODIUM CHLORIDE 0.9 % IV BOLUS
1000.0000 mL | Freq: Once | INTRAVENOUS | Status: AC
  Administered 2022-04-09: 1000 mL via INTRAVENOUS

## 2022-04-09 MED ORDER — IOHEXOL 300 MG/ML  SOLN
100.0000 mL | Freq: Once | INTRAMUSCULAR | Status: AC | PRN
  Administered 2022-04-09: 100 mL via INTRAVENOUS

## 2022-04-09 NOTE — ED Notes (Signed)
Pt has history of afib

## 2022-04-09 NOTE — Discharge Instructions (Addendum)
You were seen in the emergency department for diarrhea and low abdominal pain.  Your lab work was fairly unremarkable but your CAT scan showed some nonspecific thickening of your large bowel called colitis.  You felt well enough to go home at this time.  I am recommending a clear liquid diet, advance as tolerated if pain improved.  Follow-up with your doctor.  Return to the emergency department if any fever or worsening symptoms.

## 2022-04-09 NOTE — ED Triage Notes (Signed)
Pt states he had diarrhea since Thursday and now has burning pain in lower abdominal area. Denies n/v but feels weak. Pt was able to eat last this amm

## 2022-04-09 NOTE — ED Provider Notes (Signed)
[facility]PART[facility] Provider Note   CSN: [id number] Arrival date & time: 04/09/22  1234     History  Chief Complaint  Patient presents with   Abdominal Pain    Brian Davidson is a 86 y.o. male.  He is here with complaint of diarrhea that started 3 days ago.  Kept him up all night long multiple episodes nonbloody.  Yesterday took some Imodium and has not had any diarrhea since about noon.  Has had some intermittent stabbing lower abdominal pain last few minutes happening multiple times an hour.  Today felt generally weak all over and continued to have the stabbing lower abdominal pain.  No fevers or chills nausea vomiting.  Did just finish a course of amoxicillin for a toe infection.  Prior history of cholecystectomy and hernia surgery.  He is on blood thinners for his atrial fibrillation.  The history is provided by the patient.  Abdominal Pain Pain location:  RLQ, LLQ and suprapubic Pain quality: stabbing   Pain severity:  Moderate Onset quality:  Gradual Duration:  2 days Timing:  Intermittent Progression:  Unchanged Chronicity:  New Context: not sick contacts and not trauma   Relieved by:  Nothing Worsened by:  Palpation Ineffective treatments: brat diet. Associated symptoms: diarrhea and fatigue   Associated symptoms: no chest pain, no constipation, no cough, no dysuria, no fever, no hematemesis, no hematochezia, no hematuria, no melena, no nausea, no shortness of breath and no vomiting        Home Medications Prior to Admission medications   Medication Sig Start Date End Date Taking? Authorizing Provider  Calcium Carbonate-Vitamin D (CALTRATE 600+D PO) Take 600 mg by mouth daily.     [provider]  Cholecalciferol 50 MCG (2000 UT) TABS Take 2,000 Units by mouth daily.     [provider]  diltiazem (CARDIZEM CD) 240 MG 24 hr capsule TAKE ONE (1) CAPSULE EACH DAY BY MOUTH 04/01/22   Mosie Lukes, MD  fluticasone [facility]) 50  MCG/ACT nasal spray Use 2 spray(s) in each nostril once daily 09/10/21   Mosie Lukes, MD  gabapentin (NEURONTIN) 300 MG capsule Take 1 capsule by mouth twice daily 02/23/22   Mosie Lukes, MD  losartan (COZAAR) 25 MG tablet Take 1 tablet (25 mg total) by mouth daily. 01/27/22   Mosie Lukes, MD  metoprolol tartrate (LOPRESSOR) 100 MG tablet Take 1 tablet by mouth twice daily 02/21/22   Mosie Lukes, MD  Multiple Vitamins-Minerals (MULTIVITAMIN WITH MINERALS) tablet Take 1 tablet by mouth daily. 03/16/21   Mosie Lukes, MD  rosuvastatin (CRESTOR) 10 MG tablet Take 1 tablet (10 mg total) by mouth daily. 03/28/22   Jettie Booze, MD  Turmeric 500 MG TABS Take 500 mg by mouth daily.     [provider]  XARELTO 20 MG TABS tablet Take 1 tablet by mouth once daily 02/11/22   Mosie Lukes, MD      Allergies    Cardizem [diltiazem] and Sulfa antibiotics    Review of Systems   Review of Systems  Constitutional:  Positive for fatigue. Negative for fever.  Respiratory:  Negative for cough and shortness of breath.   Cardiovascular:  Negative for chest pain.  Gastrointestinal:  Positive for abdominal pain and diarrhea. Negative for constipation, hematemesis, hematochezia, melena, nausea and vomiting.  Genitourinary:  Negative for dysuria and hematuria.    Physical Exam Updated Vital Signs BP 132/65   Pulse 76  Temp 98.1 F (36.7 C) (Oral)   Resp (!) 24   Ht 6' (1.829 m)   Wt 93.4 kg   SpO2 95%   BMI 27.94 kg/m  Physical Exam Vitals and nursing note reviewed.  Constitutional:      General: He is not in acute distress.    Appearance: Normal appearance. He is well-developed.  HENT:     Head: Normocephalic and atraumatic.  Eyes:     Conjunctiva/sclera: Conjunctivae normal.  Cardiovascular:     Rate and Rhythm: Normal rate. Rhythm irregular.     Heart sounds: No murmur heard. Pulmonary:     Effort: Pulmonary effort is normal. No respiratory distress.      Breath sounds: Normal breath sounds.  Abdominal:     Palpations: Abdomen is soft.     Tenderness: There is abdominal tenderness in the right lower quadrant, suprapubic area and left lower quadrant. There is no guarding or rebound.  Musculoskeletal:     Cervical back: Neck supple.     Right lower leg: No edema.     Left lower leg: No edema.  Skin:    General: Skin is warm and dry.     Capillary Refill: Capillary refill takes less than 2 seconds.  Neurological:     General: No focal deficit present.     Mental Status: He is alert.     ED Results / Procedures / Treatments   Labs (all labs ordered are listed, but only abnormal results are displayed) Labs Reviewed  COMPREHENSIVE METABOLIC PANEL - Abnormal; Notable for the following components:      Result Value   Glucose, Bld 121 (*)    Creatinine, Ser 1.33 (*)    Calcium 8.4 (*)    Albumin 3.1 (*)    GFR, Estimated 53 (*)    All other components within normal limits  CBC WITH DIFFERENTIAL/PLATELET - Abnormal; Notable for the following components:   Monocytes Absolute 1.1 (*)    All other components within normal limits  URINALYSIS, ROUTINE W REFLEX MICROSCOPIC - Abnormal; Notable for the following components:   Hgb urine dipstick TRACE (*)    All other components within normal limits  URINALYSIS, MICROSCOPIC (REFLEX) - Abnormal; Notable for the following components:   Bacteria, UA RARE (*)    All other components within normal limits  GASTROINTESTINAL PANEL BY PCR, STOOL (REPLACES STOOL CULTURE)  C DIFFICILE QUICK SCREEN W PCR REFLEX    LIPASE, BLOOD  MAGNESIUM    EKG EKG Interpretation  Date/Time:  Saturday April 09 2022 12:50:28 EDT Ventricular Rate:  74 PR Interval:    QRS Duration: 94 QT Interval:  358 QTC Calculation: 398 R Axis:   -20 Text Interpretation: Atrial fibrillation Borderline left axis deviation No significant change since prior 4/23 Confirmed by Aletta Edouard [phone]) on 04/09/2022 12:54:48  PM  Radiology CT Abdomen Pelvis W Contrast  Result Date: 04/09/2022 CLINICAL DATA:  Left lower quadrant abdominal pain EXAM: CT ABDOMEN AND PELVIS WITH CONTRAST TECHNIQUE: Multidetector CT imaging of the abdomen and pelvis was performed using the standard protocol following bolus administration of intravenous contrast. RADIATION DOSE REDUCTION: This exam was performed according to the departmental dose-optimization program which includes automated exposure control, adjustment of the mA and/or kV according to patient size and/or use of iterative reconstruction technique. CONTRAST:  137m OMNIPAQUE IOHEXOL 300 MG/ML  SOLN COMPARISON:  None Available. FINDINGS: Lower chest: No acute abnormality. Cardiomegaly. Coronary artery calcifications. Hepatobiliary: No focal liver abnormality is seen. Status post  cholecystectomy. Postoperative biliary dilatation. Pancreas: Unremarkable. No pancreatic ductal dilatation or surrounding inflammatory changes. Spleen: Normal in size without significant abnormality. Adrenals/Urinary Tract: Adrenal glands are unremarkable. Kidneys are normal, without renal calculi, solid lesion, or hydronephrosis. Bladder is unremarkable. Stomach/Bowel: Stomach is within normal limits. Appendix visualized and may be surgically absent. Long segment wall thickening of the distal transverse, descending, and proximal sigmoid colon with adjacent fat stranding and inflammatory fluid (series 5, image 55). Sigmoid diverticulosis, which is largely uninvolved with the above-described process. Vascular/Lymphatic: Aortic atherosclerosis. No enlarged abdominal or pelvic lymph nodes. Reproductive: No mass or other significant abnormality. Biopsy marking clips in the prostate. Other: No abdominal wall hernia or abnormality. Small volume ascites throughout the pelvis. Musculoskeletal: No acute or significant osseous findings. IMPRESSION: 1. Long segment wall thickening of the distal transverse, descending, and  proximal sigmoid colon with adjacent fat stranding and inflammatory fluid. Findings are consistent with nonspecific infectious, inflammatory, or ischemic colitis. 2. Sigmoid diverticulosis, which is largely uninvolved with the above-described inflammatory process. 3. Small volume ascites throughout the pelvis, likely reactive. 4. Cardiomegaly and coronary artery disease. Aortic Atherosclerosis (ICD10-I70.0). Electronically Signed   By: Delanna Ahmadi M.D.   On: 04/09/2022 14:44    Procedures Procedures    Medications Ordered in ED Medications  sodium chloride 0.9 % bolus 1,000 mL (0 mLs Intravenous Stopped 04/09/22 1435)  iohexol (OMNIPAQUE) 300 MG/ML solution 100 mL (100 mLs Intravenous Contrast Given 04/09/22 1417)    ED Course/ Medical Decision Making/ A&P Clinical Course as of 04/09/22 1748  Sat Apr 09, 2022  1503 Had a shared decision-making discussion with patient and wife regarding admission versus home with bowel rest.  They would like to go home.  He says his abdominal pain spasms seem to be less frequent and he is not having any more diarrhea.  He does not have a white count and his renal function although elevated is baseline for him.  Recommended clear liquid diet and slowly advance as tolerated.  Return to the emergency department if any fevers or worsening symptoms. [MB]    Clinical Course User Index [MB] Hayden Rasmussen, MD                           Medical Decision Making Amount and/or Complexity of Data Reviewed Labs: ordered. Radiology: ordered.  Risk Prescription drug management.  This patient complains of diarrhea lower abdominal pain weakness; this involves an extensive number of treatment Options and is a complaint that carries with it a high risk of complications and morbidity. The differential includes anemia, metabolic derangement, dehydration, arrhythmia, colitis, diverticulitis, UTI  I ordered, reviewed and interpreted labs, which included CBC with normal  white count normal hemoglobin, chemistries normal other than mild elevated creatinine, urinalysis without signs of infection, stool studies and C. difficile ordered not obtained I ordered medication IV fluids and reviewed PMP when indicated. I ordered imaging studies which included CT abdomen and pelvis and I independently    visualized and interpreted imaging which showed nonspecific colitis, diverticulosis Additional history obtained from patient's wife Previous records obtained and reviewed in epic including recent ED visits  Cardiac monitoring reviewed, normal sinus rhythm Social determinants considered, no significant barriers Critical Interventions: None  After the interventions stated above, I reevaluated the patient and found patient is having less frequent episodes of abdominal pain and has had no diarrhea here. Admission and further testing considered, he was offered admission to the hospital for  bowel rest and IV fluids pain control versus discharge home with close follow-up.  Patient elects to go home.  He will continue with a clear liquid diet and advance as tolerated.  Return instructions discussed.          Final Clinical Impression(s) / ED Diagnoses Final diagnoses:  Diarrhea, unspecified type  Lower abdominal pain  Colitis    Rx / DC Orders ED Discharge Orders     None         Hayden Rasmussen, MD 04/09/22 1750

## 2022-04-21 ENCOUNTER — Ambulatory Visit (INDEPENDENT_AMBULATORY_CARE_PROVIDER_SITE_OTHER): Payer: PPO | Admitting: Pharmacist

## 2022-04-21 DIAGNOSIS — E782 Mixed hyperlipidemia: Secondary | ICD-10-CM

## 2022-04-21 DIAGNOSIS — I10 Essential (primary) hypertension: Secondary | ICD-10-CM

## 2022-04-21 DIAGNOSIS — I4821 Permanent atrial fibrillation: Secondary | ICD-10-CM

## 2022-04-21 NOTE — Chronic Care Management (AMB) (Signed)
Chronic Care Management Pharmacy Note  04/21/2022 Name:  Brian Davidson MRN:  [id number] DOB:  1936-04-13  Summary:  Patient reports he is doing well. Blood pressure at home continues to be at goal  120 to 135 / 70 to 80. He states he occasionally has dizziness when out in the son but only lasts for a minute or less. Reminded patient to hydrate before, during and after outside work.  Discussed cost of Xarelto - patient has not reached Medicare coverage gap this year. Unfortunately [employer] no longer offers patient assistance program to Medicare patients even when they reach medicare coverage gap. Provided patient with information on [employer] program if needed ($85 / 30 days and $240 /90 days of xarelto) Patient was seen in ER 04/09/2022 for diarrhea - patient reports today diarrhea has resolved.  Discussed Health Maintenance. - reminded patient to get annual flu vaccine.   No follow up planned but patient is aware he can contact Clinical Pharmacist Practitioner if medication related needs arise in the future.   Subjective: Brian Davidson is an 86 y.o. year old male who is a primary patient of Mosie Lukes, MD.  The CCM team was consulted for assistance with disease management and care coordination needs.    Engaged with patient by telephone for follow up visit in response to provider referral for pharmacy case management and/or care coordination services.   Consent to Services:  The patient was given information about Chronic Care Management services, agreed to services, and gave verbal consent prior to initiation of services.  Please see initial visit note for detailed documentation.   Patient Care Team: Mosie Lukes, MD as PCP - General (Family Medicine) Thompson Grayer, MD as PCP - Electrophysiology (Cardiology) Jettie Booze, MD as PCP - Cardiology (Cardiology) Thompson Grayer, MD as Consulting Physician (Cardiology) Franchot Mimes, MD as Consulting Physician  (Family Medicine) Armbruster, Carlota Raspberry, MD as Consulting Physician (Gastroenterology) Gaynelle Arabian, MD as Consulting Physician (Orthopedic Surgery) Tyler Pita, MD as Consulting Physician (Radiation Oncology) Festus Aloe, MD as Consulting Physician (Urology) Delice Bison Charlestine Massed, NP as Nurse Practitioner (Hematology and Oncology) Harmon Pier, RN as Registered Nurse  Recent office visits: 12/07/2021 - Fam Med (Dr Charlett Blake) Preventative health visit. BP elevated - added losartan. Checked labs. Ordered renal ultrasound.  10/21/2021 - Fam Med ([place], [place]) Seen for altered taste. Felt like change in taste occurred after he stopped MVI and B1 supplement. Referred to ENT.   Recent consult visits: 03/29/2022 - Cardio/Thoracic surgery ([facility]) F/U thoracic aortic aneurysm. No change in aneurysm noted on CT. F/U 1 year.  03/23/2022 - Cardio (Dr Irish Lack) F/U Afib. No med changes noted.  03/11/2022 [facility] - initial consult for taste alteration. Ordered maxillofacial CT scan.Scan showed Minimal right maxillary sinus mucosal thickening. Patent sinus drainage pathways. F/U as needed.  02/17/2022 - Ortho ([place]) 2 years s/p rigth TKA. Fall 2 weeks ago. Recommended ice and Voltaren gel or Lidocaine cream as needed. F/U 1 year.  02/09/2022 - Podiatry (Dr Mallie Mussel) Laverle Hobby for corns and callosities 02/02/2022 - Urology (Dr Junious Silk) F/U prostate cancer treated with EBXRT in 2022. No medication changes noted.  02/01/2022 - Ortho (Dr Athena Masse) seen for pain in rigth knee. Xray checked. No acute fracture identified. Recommended ACE bandage; RICE therapy. 01/20/2022 - Vasc Surgery (Dr Scot Dock) Initial consult for mid renal artery stenosis and mesenteric atrerial occlusive disease. 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 Since asymptomatic -  no CT angiogram or further w/u needed. 09/28/2021 - Urology (Dr Junious Silk) treated with external beam radiation therapy for prostate  cancer for 5.5 weeks - completed 03/05/2021.  PSA checked and is decreasing. No medications changes. F/U planned in 3 months for renal ultrasound and cystoscopy.   Hospital visits: 04/09/2022 - ED Visit for diarrhea and abdominal pain. Labs and imaging ordered. Recommended bowel rest  01/05/2022 - ED at [facility] Seen for suture removal.  Referral to reconstructive surgery given for management of facial fractures seen on prior imaging 12/26/2021 - ED [facility]. Seen for fall wiht fracture of facial bone and laceration of right hand. Received sutures to hand   Objective:  Lab Results  Component Value Date   CREATININE 1.33 (H) 04/09/2022   CREATININE 1.44 01/10/2022   CREATININE 1.21 12/26/2021    Lab Results  Component Value Date   HGBA1C 5.9 12/07/2021   Last diabetic Eye exam: No results found for: "HMDIABEYEEXA"  Last diabetic Foot exam: No results found for: "HMDIABFOOTEX"      Component Value Date/Time   CHOL 102 12/07/2021 1050   TRIG 75.0 12/07/2021 1050   HDL 42.40 12/07/2021 1050   CHOLHDL 2 12/07/2021 1050   VLDL 15.0 12/07/2021 1050   LDLCALC 45 12/07/2021 1050   LDLCALC 107 (H) 05/26/2020 1103       Latest Ref Rng & Units 04/09/2022    1:21 PM 01/10/2022    1:59 PM 12/07/2021   10:50 AM  Hepatic Function  Total Protein 6.5 - 8.1 g/dL 6.5  6.7  6.9   Albumin 3.5 - 5.0 g/dL 3.1  3.8  4.1   AST 15 - 41 U/L 21  22  23    ALT 0 - 44 U/L 13  19  17    Alk Phosphatase 38 - 126 U/L 76  101  82   Total Bilirubin 0.3 - 1.2 mg/dL 0.9  0.9  1.0     Lab Results  Component Value Date/Time   TSH 2.05 12/07/2021 10:50 AM   TSH 2.38 06/08/2021 10:03 AM       Latest Ref Rng & Units 04/09/2022    1:21 PM 12/26/2021   11:40 AM 10/21/2021   10:20 AM  CBC  WBC 4.0 - 10.5 K/uL 9.3  7.2  5.7   Hemoglobin 13.0 - 17.0 g/dL 13.4  13.9  13.7   Hematocrit 39.0 - 52.0 % 40.8  41.5  41.7   Platelets 150 - 400 K/uL 167  137  153.0     Lab Results  Component  Value Date/Time   VD25OH 69.87 06/08/2021 10:03 AM    Clinical ASCVD: Yes  The ASCVD Risk score (Arnett DK, et al., 2019) failed to calculate for the following reasons:   The 2019 ASCVD risk score is only valid for ages 21 to 38     Social History   Tobacco Use  Smoking Status Former   Types: Pipe, Cigars   Quit date: 11/18/1985   Years since quitting: 36.4  Smokeless Tobacco Never   BP Readings from Last 3 Encounters:  04/09/22 (!) 145/77  03/29/22 125/69  03/23/22 130/70   Pulse Readings from Last 3 Encounters:  04/09/22 69  03/29/22 80  03/23/22 78   Wt Readings from Last 3 Encounters:  04/09/22 206 lb (93.4 kg)  03/29/22 206 lb (93.4 kg)  03/23/22 208 lb (94.3 kg)    Assessment: Review of patient past medical history, allergies, medications, health status, including  review of consultants reports, laboratory and other test data, was performed as part of comprehensive evaluation and provision of chronic care management services.   SDOH:  (Social Determinants of Health) assessments and interventions performed:      CCM Care Plan  Allergies  Allergen Reactions   Cardizem [Diltiazem]     Rash from Yellow dye in generic capsule.   Can take different brand. Takes diltiazem - his allergy is to Cardizem   Sulfa Antibiotics     Unknown childhood reaction    Medications Reviewed Today     Reviewed by Hayden Rasmussen, MD (Physician) on 04/09/22 at 1256  Med List Status: <None>   Medication Order Taking? Sig Documenting Provider Last Dose Status Informant  Calcium Carbonate-Vitamin D (CALTRATE 600+D PO) [id number]  Take 600 mg by mouth daily.  [provider]  Active Self  Cholecalciferol 50 MCG (2000 UT) TABS [id number]  Take 2,000 Units by mouth daily.  [provider]  Active Self  diltiazem (CARDIZEM CD) 240 MG 24 hr capsule [id number]  TAKE ONE (1) CAPSULE EACH DAY BY MOUTH Mosie Lukes, MD  Active   fluticasone (FLONASE) 50 MCG/ACT nasal  spray [id number]  Use 2 spray(s) in each nostril once daily Mosie Lukes, MD  Active   gabapentin (NEURONTIN) 300 MG capsule [id number]  Take 1 capsule by mouth twice daily Mosie Lukes, MD  Active   losartan (COZAAR) 25 MG tablet [id number]  Take 1 tablet (25 mg total) by mouth daily. Mosie Lukes, MD  Active   metoprolol tartrate (LOPRESSOR) 100 MG tablet [id number]  Take 1 tablet by mouth twice daily Mosie Lukes, MD  Active   Multiple Vitamins-Minerals (MULTIVITAMIN WITH MINERALS) tablet [id number]  Take 1 tablet by mouth daily. Mosie Lukes, MD  Active   rosuvastatin (CRESTOR) 10 MG tablet [id number]  Take 1 tablet (10 mg total) by mouth daily. Jettie Booze, MD  Active   Turmeric 500 MG TABS [id number]  Take 500 mg by mouth daily.  [provider]  Active Self  XARELTO 20 MG TABS tablet [id number]  Take 1 tablet by mouth once daily Mosie Lukes, MD  Active   Med List Note Starleen Arms 05/27/15 1156):               Patient Active Problem List   Diagnosis Date Noted   COVID-19 03/17/2021   Hyperglycemia 11/24/2020   Malignant neoplasm of prostate ([place]) 11/24/2020   Thoracic aortic aneurysm ([place]) 05/27/2020   Primary osteoarthritis of right knee 12/23/2019   Symptomatic bradycardia 05/31/2019   Vertigo 07/27/2018   Multiple closed fractures of ribs of right side 01/21/2018   Thiamine deficiency 01/19/2018   Peripheral neuropathy 07/13/2017   Hyperlipidemia 07/13/2017   Preventative health care 01/11/2017   Carotid artery disease ([place]) 07/01/2016   Mitral valve disorder 07/01/2016   Low back pain 12/31/2015   Osteopenia 12/31/2015   Allergic state 12/31/2015   Skin lesion 12/31/2015   History of chicken pox    Hematuria 04/07/2015   Calculi, ureter 03/30/2015   OA (osteoarthritis) of knee 05/10/2014   Pacemaker-[employer] 02/01/2012   Permanent atrial fibrillation ([place])    Hypertension    Heart murmur    Chronic anticoagulation      Immunization History  Administered Date(s) Administered   Influenza, High Dose Seasonal PF 06/18/2017, 06/11/2018, 04/29/2019, 04/28/2020, 05/10/2021   Influenza-Unspecified 06/11/2016   PFIZER(Purple Top)SARS-COV-2 Vaccination 09/24/2019, 10/10/2019, 05/30/2020, 04/22/2021  Pension scheme manager 78yr & up 05/29/2021   Pneumococcal Conjugate-13 07/20/2015   Pneumococcal Polysaccharide-23 09/05/2016   Tdap 11/14/2014, 05/29/2019   Zoster Recombinat (Shingrix) 12/26/2016, 03/21/2017    Conditions to be addressed/monitored: Atrial Fibrillation, CAD, HTN, HLD and pre DM; prostate cancer (active) neuropathy  Care Plan : General Pharmacy (Adult)  Updates made by ECherre Robins RPH-CPP since 04/21/2022 12:00 AM     Problem: Chronic Disease Management support, education, and care coordination needs related to Hypertension, Hyperlipidemia/CAD, AFib, Osteopenia, Neuropathy/Pain Resolved 04/21/2022  Priority: High  Onset Date: 01/19/2021  Note:   Current Barriers:  Reaches Medicare coverage gap with Xarelto  Unable to achieve control of lipids  - met goal Chronic Disease Management support, education, and care coordination needs related to Hypertension, Hyperlipidemia/CAD, AFib, Osteopenia, Neuropathy/Pain  Pharmacist Clinical Goal(s):  Over the next 180 days, patient will verbalize ability to afford treatment regimen achieve control of lipids as evidenced by LDL <70 maintain control of blood pressure / hypertension  as evidenced by blood pressure  <130/80  through collaboration with PharmD and provider.   Interventions: 1:1 collaboration with BMosie Lukes MD regarding development and update of comprehensive plan of care as evidenced by provider attestation and co-signature Inter-disciplinary care team collaboration (see longitudinal plan of care) Comprehensive medication review performed; medication list updated in electronic medical record   Hypertension /  Thoracic Aortic Aneurysm BP improving; goal is to maintain BP goal <130/80 Patient stopped irbesartan-HCTZ due to dizziness - per Dr VIrish Lackin early 2022 Denies dizziness since changed diltiazem to take in the afternoon  Current regimen:  Metoprolol tartrate 1069mtwice daily Diltiazem 24024maily each afternoon Losartan 45m28mily (started 12/2021) Interventions:.  Discussed blood pressure goals Continue to check blood pressure 2 to 3 times per week and record Ensure daily salt intake < 2300 mg/day Continue current medications Updated prescription for losartan to get 90 day supply per patient request.  Hyperlipidemia At goal; LDL goal < 70 Current regimen:  Rosuvastatin 10mg73mly  Interventions: Discussed LDL goal; since started rosuvastatin LDL is now at goal Continue to take rosuvastatin daily Recommend at least 150 minutes of exercise per week  Osteopenia Current regimen:  Calcium 600mg 43my Vitamin D 2000 units daily  DEXA 09/07/2021:  Neck of femur -1.5 (previous was -1.6 on 01/25/2018) Total femur -0.6 (previous was -0.4 on 01/25/2018) Forearm Radius 33% +0.5 (no previous value) Interventions:  Recommended maintain osteopenia medication regimen   Neuropathy Per patient neuropathic is controlled with current therapy Last B12 was elevated at 1048 (06/08/2021) Last B1 was lower but still above normal at 34 (06/08/2021)  Patient was advised by PCP to lower dose of B12 and B1 or stop both supplements. B12 and B1 checked in the last 4 months and both were normal.  Current regimen:  Gabapentin 300mg t66m a day   Interventions:  None today   Atrial Fibrillation:  HR controlled since last visit; patient is taking stroke prevention therapy daily CHADS2VASc score: 4 Current regimen:  Diltiazem 240mg da63mfor heart rate  Xarelto 20mg dai73mo prevent stroke  Interventions: Applied for medication assistance program for Xarelto in August 2022 but patient did not  qualify.  Will continue to follow, has to reach coverage gap and will reapply for 2023 - he should qualify based on reported income today. Reviewed last SCr and eGFR - last eGFR was 52 based on Scr from 01/10/2022. Dose of Xarelto currently appropriate. If eGFR drops to < 50  should consider lower Xarelto dose of 103m daily .  Continue current medications for atrial fibrillation  Pre-Diabetes: Controlled; A1c goal is <6.5% Current treatment: Diet and exercise  Does not check BG at home Usually exercises 5 to 7 days per week. Will take off during radiation therapy that will start 01/26/21.  Interventions:  Continue to limiting sugar and carbohydrate intake Continue as planned to restart exercise after upcoming radiation therapy.   Medication management Pharmacist Clinical Goal(s): Over the next 90 days, patient will work with PharmD and providers to maintain optimal medication adherence Current pharmacy: [facility] (gets only diltiazem at [facility]) Interventions Comprehensive medication review performed. Continue current medication management strategy  Patient Goals/Self-Care Activities Over the next 180 days, patient will:  take medications as prescribed,  check blood pressure 2 to 3 times per week, document, and provide at future appointments, and  collaborate with provider on medication access solutions  Follow Up Plan: No further follow up required: Patient has met all medication related goals.       Medication Assistance: Applied for medication assistance program for Xarelto in 2022. Patient did not meet 4% of income out of pocket spend. Unfortunately for 2023 Medicare patients are not eligible to apply for [facility] Networmedication assistance program for xarelto. Pateint is aware and provided information about JGwendel Hansonwhich is a program with JDorie Rank contacted mail order pharmacy to get Xarelto for $85/30 days or $240 / 90 days.   Patient's preferred pharmacy is[facility]  [facility] [id number]: [phone]: [phone] [facility] [address] [place] [address] [place]Phone: [phone]: [phone] [place]- [address] [place]Phone: [phone]: [phone]  Plan: No further follow up required: Patient has met medication related goals.   TCherre Robins PharmD Clinical Pharmacist [facility]

## 2022-04-21 NOTE — Patient Instructions (Signed)
[name] It was a pleasure speaking with you today.  Below is a summary of your health goals and summary of our recent visit. You can also view your updated Chronic Care Management Care plan through your MyChart account.   Patient Goals/Self-Care Activities take medications as prescribed,  check blood pressure 2 to 3 times per week, document, and provide at future appointments, and  collaborate with provider on medication access solutions Remember to get annual flu vaccine this Fall.   As always if you have any questions or concerns especially regarding medications, please feel free to contact me either at the phone number below or with a MyChart message.   Keep up the good work!  Cherre Robins, PharmD Clinical Pharmacist [facility] [place] [facility] [phone] (direct line)  [phone] (main office number)   Patient verbalizes understanding of instructions and care plan provided today and agrees to view in [place]. Active MyChart status and patient understanding of how to access instructions and care plan via MyChart confirmed with patient.

## 2022-05-05 DIAGNOSIS — E782 Mixed hyperlipidemia: Secondary | ICD-10-CM | POA: Diagnosis not present

## 2022-05-05 DIAGNOSIS — I4891 Unspecified atrial fibrillation: Secondary | ICD-10-CM

## 2022-05-05 DIAGNOSIS — I10 Essential (primary) hypertension: Secondary | ICD-10-CM

## 2022-05-05 DIAGNOSIS — I712 Thoracic aortic aneurysm, without rupture, unspecified: Secondary | ICD-10-CM | POA: Diagnosis not present

## 2022-05-17 DIAGNOSIS — L89893 Pressure ulcer of other site, stage 3: Secondary | ICD-10-CM | POA: Diagnosis not present

## 2022-05-23 ENCOUNTER — Other Ambulatory Visit: Payer: Self-pay | Admitting: Family Medicine

## 2022-05-30 DIAGNOSIS — Z8546 Personal history of malignant neoplasm of prostate: Secondary | ICD-10-CM | POA: Diagnosis not present

## 2022-06-01 ENCOUNTER — Ambulatory Visit (INDEPENDENT_AMBULATORY_CARE_PROVIDER_SITE_OTHER): Payer: PPO

## 2022-06-01 DIAGNOSIS — I495 Sick sinus syndrome: Secondary | ICD-10-CM | POA: Diagnosis not present

## 2022-06-02 DIAGNOSIS — N400 Enlarged prostate without lower urinary tract symptoms: Secondary | ICD-10-CM | POA: Diagnosis not present

## 2022-06-02 DIAGNOSIS — Z8546 Personal history of malignant neoplasm of prostate: Secondary | ICD-10-CM | POA: Diagnosis not present

## 2022-06-02 LAB — CUP PACEART REMOTE DEVICE CHECK
Battery Remaining Longevity: 43 mo
Battery Remaining Percentage: 31 %
Battery Voltage: 2.92 V
Brady Statistic RV Percent Paced: 17 %
Date Time Interrogation Session: 20230927021712
Implantable Lead Implant Date: 20130528
Implantable Lead Location: 753860
Implantable Lead Model: 1948
Implantable Pulse Generator Implant Date: 20130528
Lead Channel Impedance Value: 640 Ohm
Lead Channel Pacing Threshold Amplitude: 0.75 V
Lead Channel Pacing Threshold Pulse Width: 0.4 ms
Lead Channel Sensing Intrinsic Amplitude: 12 mV
Lead Channel Setting Pacing Amplitude: 2.5 V
Lead Channel Setting Pacing Pulse Width: 0.4 ms
Lead Channel Setting Sensing Sensitivity: 2 mV
Pulse Gen Model: 1210
Pulse Gen Serial Number: 7328888

## 2022-06-09 ENCOUNTER — Telehealth: Payer: PPO | Admitting: Family Medicine

## 2022-06-09 ENCOUNTER — Telehealth (INDEPENDENT_AMBULATORY_CARE_PROVIDER_SITE_OTHER): Payer: PPO | Admitting: Family Medicine

## 2022-06-09 DIAGNOSIS — E8809 Other disorders of plasma-protein metabolism, not elsewhere classified: Secondary | ICD-10-CM | POA: Diagnosis not present

## 2022-06-09 DIAGNOSIS — E519 Thiamine deficiency, unspecified: Secondary | ICD-10-CM | POA: Diagnosis not present

## 2022-06-09 DIAGNOSIS — R739 Hyperglycemia, unspecified: Secondary | ICD-10-CM

## 2022-06-09 DIAGNOSIS — E782 Mixed hyperlipidemia: Secondary | ICD-10-CM

## 2022-06-09 DIAGNOSIS — N189 Chronic kidney disease, unspecified: Secondary | ICD-10-CM

## 2022-06-09 DIAGNOSIS — G6289 Other specified polyneuropathies: Secondary | ICD-10-CM | POA: Diagnosis not present

## 2022-06-09 DIAGNOSIS — Z0000 Encounter for general adult medical examination without abnormal findings: Secondary | ICD-10-CM | POA: Diagnosis not present

## 2022-06-09 NOTE — Assessment & Plan Note (Signed)
Supplement and monitor calcium and Vitamin D

## 2022-06-09 NOTE — Assessment & Plan Note (Signed)
Supplement and monitor 

## 2022-06-09 NOTE — Progress Notes (Signed)
Remote pacemaker transmission.   

## 2022-06-09 NOTE — Assessment & Plan Note (Signed)
RSV (respiratory syncitial virus) vaccine at pharmacy, Arexvy Covid booster  newversion t pharmacy High dose flu shot

## 2022-06-09 NOTE — Assessment & Plan Note (Signed)
Encourage heart healthy diet such as MIND or DASH diet, increase exercise, avoid trans fats, simple carbohydrates and processed foods, consider a krill or fish or flaxseed oil cap daily.  Tolerating Rosuvastatin 

## 2022-06-09 NOTE — Assessment & Plan Note (Signed)
Hydrate and monitor 

## 2022-06-09 NOTE — Assessment & Plan Note (Signed)
hgba1c acceptable, minimize simple carbs. Increase exercise as tolerated.  

## 2022-06-10 DIAGNOSIS — E8809 Other disorders of plasma-protein metabolism, not elsewhere classified: Secondary | ICD-10-CM | POA: Insufficient documentation

## 2022-06-10 NOTE — Assessment & Plan Note (Signed)
Continues to bother him daily but he manges to stay busy and is not overwhelmed. He notes when he walks the pain improves some. No changes for now.

## 2022-06-10 NOTE — Progress Notes (Signed)
MyChart Video Visit    Virtual Visit via Video Note   This visit type was conducted due to national recommendations for restrictions regarding the COVID-19 Pandemic (e.g. social distancing) in an effort to limit this patient's exposure and mitigate transmission in our community. This patient is at least at moderate risk for complications without adequate follow up. This format is felt to be most appropriate for this patient at this time. Physical exam was limited by quality of the video and audio technology used for the visit. Shamaine, CMA was able to get the patient set up on a video visit.  Patient location: home Patient and provider in visit Provider location: Office  I discussed the limitations of evaluation and management by telemedicine and the availability of in person appointments. The patient expressed understanding and agreed to proceed.  Visit Date: 06/09/2022  Today's healthcare provider: [name], MD     Subjective:    Patient ID: Brian Davidson, male    DOB: 07/11/1936, 86 y.o.   MRN: [id number]  No chief complaint on file.   HPI Patient is in today for follow-up on chronic medical concerns.  Overall he is doing well.  No recent febrile illness or hospitalizations.  His peripheral nerve neuropathy continues to bother him daily but he notes when he walks it is somewhat improved.  He does not feel it has worsened significantly. Denies CP/palp/SOB/HA/congestion/fevers/GI or GU c/o. Taking meds as prescribed   Past Medical History:  Diagnosis Date   Arthritis    Biliary dyskinesia    Carotid artery disease ([place]) 07/01/2016   Dyslipidemia 07/13/2017   Dysrhythmia    a fib   GERD (gastroesophageal reflux disease)    occasional   H/O measles    History of chicken pox    History of kidney stones    Hypertension    Neuromuscular disorder (HCC)    neuropathy feet    Persistent atrial fibrillation (HCC)    Pneumonia 1992   Presence of permanent cardiac  pacemaker    Prostate cancer ([place])    Tachycardia-bradycardia ([place])    a. s/p STJ dual chamber pacemaker   Thiamine deficiency 01/19/2018   Valvular heart disease 07/01/2016    Past Surgical History:  Procedure Laterality Date   CHOLECYSTECTOMY N/A 11/24/2015   Procedure: LAPAROSCOPIC CHOLECYSTECTOMY;  Surgeon: Greer Pickerel, MD;  Location: [facility];  Service: General;  Laterality: N/A;   CYSTOSCOPY  2016   EYE SURGERY     bil cataract   FRACTURE SURGERY  2005   left ankle   HERNIA REPAIR     left groin, no mesh   PERMANENT PACEMAKER INSERTION N/A 01/31/2012   SJM Accent SR RF implanted by DR Allred   TOTAL KNEE ARTHROPLASTY Right 12/23/2019   Procedure: TOTAL KNEE ARTHROPLASTY;  Surgeon: Gaynelle Arabian, MD;  Location: [facility];  Service: Orthopedics;  Laterality: Right;  46mn    Family History  Problem Relation Age of Onset   Neuropathy Mother    Hypertension Mother    Arthritis Mother    Drug abuse Mother    Stroke Father    Heart disease Father    Prostate cancer Father        not treated   Heart disease Sister 766  Heart disease Sister    Heart disease Brother    Neuropathy Brother    Hypertension Daughter    Colon cancer Neg Hx     Social History   Socioeconomic History  Marital status: Married    Spouse name: [name]   Number of children: 1   Years of education: Not on file   Highest education level: Not on file  Occupational History   Occupation: Retired   Occupation: Dance movement psychotherapist for [employer]  Tobacco Use   Smoking status: Former    Types: Pipe, Cigars    Quit date: 11/18/1985    Years since quitting: 36.5   Smokeless tobacco: Never  Vaping Use   Vaping Use: Never used  Substance and Sexual Activity   Alcohol use: Yes    Alcohol/week: 3.0 standard drinks of alcohol    Types: 3 Cans of beer per week    Comment: occ   Drug use: No   Sexual activity: Not Currently  Other Topics Concern   Not on file  Social History Narrative   Lives with wife,  still works part-time as a Geophysicist/field seismologist. He is active around the house and yard...   Parents are deceased and did not have cardiac issues but he has 2 siblings with atrial fibrillation and a sister-in-law  has a pacemaker.   Social Determinants of Health   Financial Resource Strain: Low Risk  (01/27/2022)   Overall Financial Resource Strain (CARDIA)    Difficulty of Paying Living Expenses: Not very hard  Food Insecurity: No Food Insecurity (08/23/2021)   Hunger Vital Sign    Worried About Running Out of Food in the Last Year: Never true    Ran Out of Food in the Last Year: Never true  Transportation Needs: No Transportation Needs (08/23/2021)   PRAPARE - Hydrologist (Medical): No    Lack of Transportation (Non-Medical): No  Physical Activity: Sufficiently Active (08/23/2021)   Exercise Vital Sign    Days of Exercise per Week: 4 days    Minutes of Exercise per Session: 60 min  Stress: No Stress Concern Present (08/23/2021)   [place]    Feeling of Stress : Not at all  Social Connections: Moderately Isolated (08/23/2021)   Social Connection and Isolation Panel [NHANES]    Frequency of Communication with Friends and Family: Three times a week    Frequency of Social Gatherings with Friends and Family: Three times a week    Attends Religious Services: Never    Active Member of Clubs or Organizations: No    Attends Archivist Meetings: Never    Marital Status: Married  Human resources officer Violence: Not At Risk (08/23/2021)   Humiliation, Afraid, Rape, and Kick questionnaire    Fear of Current or Ex-Partner: No    Emotionally Abused: No    Physically Abused: No    Sexually Abused: No    Outpatient Medications Prior to Visit  Medication Sig Dispense Refill   Calcium Carbonate-Vitamin D (CALTRATE 600+D PO) Take 600 mg by mouth daily.      Cholecalciferol 50 MCG (2000 UT) TABS Take 2,000  Units by mouth daily.      diltiazem (CARDIZEM CD) 240 MG 24 hr capsule TAKE ONE (1) CAPSULE EACH DAY BY MOUTH 90 capsule 1   fluticasone (FLONASE) 50 MCG/ACT nasal spray Use 2 spray(s) in each nostril once daily 16 g 5   gabapentin (NEURONTIN) 300 MG capsule Take 1 capsule by mouth twice daily 180 capsule 0   losartan (COZAAR) 25 MG tablet Take 1 tablet (25 mg total) by mouth daily. 90 tablet 1   metoprolol tartrate (LOPRESSOR) 100 MG  tablet Take 1 tablet by mouth twice daily 180 tablet 1   Multiple Vitamins-Minerals (MULTIVITAMIN WITH MINERALS) tablet Take 1 tablet by mouth daily. 30 tablet 3   rosuvastatin (CRESTOR) 10 MG tablet Take 1 tablet (10 mg total) by mouth daily. 90 tablet 3   Turmeric 500 MG TABS Take 500 mg by mouth daily.      XARELTO 20 MG TABS tablet Take 1 tablet by mouth once daily 90 tablet 0   No facility-administered medications prior to visit.    Allergies  Allergen Reactions   Cardizem [Diltiazem]     Rash from Yellow dye in generic capsule.   Can take different brand. Takes diltiazem - his allergy is to Cardizem   Sulfa Antibiotics     Unknown childhood reaction    Review of Systems  Constitutional:  Negative for fever and malaise/fatigue.  HENT:  Negative for congestion.   Eyes:  Negative for blurred vision.  Respiratory:  Negative for shortness of breath.   Cardiovascular:  Negative for chest pain, palpitations and leg swelling.  Gastrointestinal:  Negative for abdominal pain, blood in stool and nausea.  Genitourinary:  Negative for dysuria and frequency.  Musculoskeletal:  Positive for myalgias. Negative for falls.  Skin:  Negative for rash.  Neurological:  Positive for tingling. Negative for dizziness, loss of consciousness and headaches.  Endo/Heme/Allergies:  Negative for environmental allergies.  Psychiatric/Behavioral:  Negative for depression. The patient is not nervous/anxious.        Objective:    Physical Exam Constitutional:       General: He is not in acute distress.    Appearance: Normal appearance. He is not ill-appearing or toxic-appearing.  HENT:     Head: Normocephalic and atraumatic.     Right Ear: External ear normal.     Left Ear: External ear normal.     Nose: Nose normal.  Eyes:     General:        Right eye: No discharge.        Left eye: No discharge.  Pulmonary:     Effort: Pulmonary effort is normal.  Skin:    Findings: No rash.  Neurological:     Mental Status: He is alert and oriented to person, place, and time.  Psychiatric:        Behavior: Behavior normal.     There were no vitals taken for this visit. Wt Readings from Last 3 Encounters:  04/09/22 206 lb (93.4 kg)  03/29/22 206 lb (93.4 kg)  03/23/22 208 lb (94.3 kg)    Diabetic Foot Exam - Simple   No data filed    Lab Results  Component Value Date   WBC 9.3 04/09/2022   HGB 13.4 04/09/2022   HCT 40.8 04/09/2022   PLT 167 04/09/2022   GLUCOSE 121 (H) 04/09/2022   CHOL 102 12/07/2021   TRIG 75.0 12/07/2021   HDL 42.40 12/07/2021   LDLCALC 45 12/07/2021   ALT 13 04/09/2022   AST 21 04/09/2022   NA 136 04/09/2022   K 4.2 04/09/2022   CL 109 04/09/2022   CREATININE 1.33 (H) 04/09/2022   BUN 20 04/09/2022   CO2 22 04/09/2022   TSH 2.05 12/07/2021   INR 1.5 (H) 12/16/2019   HGBA1C 5.9 12/07/2021    Lab Results  Component Value Date   TSH 2.05 12/07/2021   Lab Results  Component Value Date   WBC 9.3 04/09/2022   HGB 13.4 04/09/2022   HCT 40.8 04/09/2022  MCV 90.3 04/09/2022   PLT 167 04/09/2022   Lab Results  Component Value Date   NA 136 04/09/2022   K 4.2 04/09/2022   CO2 22 04/09/2022   GLUCOSE 121 (H) 04/09/2022   BUN 20 04/09/2022   CREATININE 1.33 (H) 04/09/2022   BILITOT 0.9 04/09/2022   ALKPHOS 76 04/09/2022   AST 21 04/09/2022   ALT 13 04/09/2022   PROT 6.5 04/09/2022   ALBUMIN 3.1 (L) 04/09/2022   CALCIUM 8.4 (L) 04/09/2022   ANIONGAP 5 04/09/2022   GFR 44.68 (L) 01/10/2022   Lab  Results  Component Value Date   CHOL 102 12/07/2021   Lab Results  Component Value Date   HDL 42.40 12/07/2021   Lab Results  Component Value Date   LDLCALC 45 12/07/2021   Lab Results  Component Value Date   TRIG 75.0 12/07/2021   Lab Results  Component Value Date   CHOLHDL 2 12/07/2021   Lab Results  Component Value Date   HGBA1C 5.9 12/07/2021       Assessment & Plan:   Problem List Items Addressed This Visit     Preventative health care    RSV (respiratory syncitial virus) vaccine at pharmacy, Arexvy Covid booster  newversion t pharmacy High dose flu shot       Peripheral neuropathy    Continues to bother him daily but he manges to stay busy and is not overwhelmed. He notes when he walks the pain improves some. No changes for now.       Hyperlipidemia    Encourage heart healthy diet such as MIND or DASH diet, increase exercise, avoid trans fats, simple carbohydrates and processed foods, consider a krill or fish or flaxseed oil cap daily. Tolerating Rosuvastatin      Thiamine deficiency    Supplement and monitor      Hyperglycemia    hgba1c acceptable, minimize simple carbs. Increase exercise as tolerated.       Hypocalcemia    Supplement and monitor calcium and Vitamin D      CRI (chronic renal insufficiency)    Hydrate and monitor      Hypoalbuminemia    Mild, increase protein in diet and monitor       I am having Brian Davidson "Brian Davidson" maintain his Turmeric, Cholecalciferol, Calcium Carbonate-Vitamin D (CALTRATE 600+D PO), multivitamin with minerals, fluticasone, losartan, Xarelto, metoprolol tartrate, rosuvastatin, diltiazem, and gabapentin.  No orders of the defined types were placed in this encounter.   I discussed the assessment and treatment plan with the patient. The patient was provided an opportunity to ask questions and all were answered. The patient agreed with the plan and demonstrated an understanding of the instructions.    The patient was advised to call back or seek an in-person evaluation if the symptoms worsen or if the condition fails to improve as anticipated.   [name], MD [facility] [phone] (phone) [phone] (fax)  [place]

## 2022-06-10 NOTE — Assessment & Plan Note (Signed)
Mild, increase protein in diet and monitor

## 2022-06-28 ENCOUNTER — Other Ambulatory Visit: Payer: Self-pay | Admitting: Family Medicine

## 2022-06-29 DIAGNOSIS — H59812 Chorioretinal scars after surgery for detachment, left eye: Secondary | ICD-10-CM | POA: Diagnosis not present

## 2022-06-29 DIAGNOSIS — H524 Presbyopia: Secondary | ICD-10-CM | POA: Diagnosis not present

## 2022-06-29 DIAGNOSIS — H04123 Dry eye syndrome of bilateral lacrimal glands: Secondary | ICD-10-CM | POA: Diagnosis not present

## 2022-06-29 DIAGNOSIS — H52223 Regular astigmatism, bilateral: Secondary | ICD-10-CM | POA: Diagnosis not present

## 2022-06-29 DIAGNOSIS — H02054 Trichiasis without entropian left upper eyelid: Secondary | ICD-10-CM | POA: Diagnosis not present

## 2022-06-29 DIAGNOSIS — Z961 Presence of intraocular lens: Secondary | ICD-10-CM | POA: Diagnosis not present

## 2022-07-04 DIAGNOSIS — L821 Other seborrheic keratosis: Secondary | ICD-10-CM | POA: Diagnosis not present

## 2022-07-04 DIAGNOSIS — D485 Neoplasm of uncertain behavior of skin: Secondary | ICD-10-CM | POA: Diagnosis not present

## 2022-07-04 DIAGNOSIS — E663 Overweight: Secondary | ICD-10-CM | POA: Diagnosis not present

## 2022-07-04 DIAGNOSIS — D3612 Benign neoplasm of peripheral nerves and autonomic nervous system, upper limb, including shoulder: Secondary | ICD-10-CM | POA: Diagnosis not present

## 2022-07-11 ENCOUNTER — Other Ambulatory Visit (INDEPENDENT_AMBULATORY_CARE_PROVIDER_SITE_OTHER): Payer: PPO

## 2022-07-11 ENCOUNTER — Other Ambulatory Visit: Payer: Self-pay

## 2022-07-11 DIAGNOSIS — E782 Mixed hyperlipidemia: Secondary | ICD-10-CM

## 2022-07-11 DIAGNOSIS — R739 Hyperglycemia, unspecified: Secondary | ICD-10-CM

## 2022-07-11 DIAGNOSIS — I10 Essential (primary) hypertension: Secondary | ICD-10-CM

## 2022-07-11 LAB — HEMOGLOBIN A1C: Hgb A1c MFr Bld: 5.7 % (ref 4.6–6.5)

## 2022-07-11 LAB — CBC WITH DIFFERENTIAL/PLATELET
Basophils Absolute: 0 10*3/uL (ref 0.0–0.1)
Basophils Relative: 0.8 % (ref 0.0–3.0)
Eosinophils Absolute: 0.1 10*3/uL (ref 0.0–0.7)
Eosinophils Relative: 3 % (ref 0.0–5.0)
HCT: 40.1 % (ref 39.0–52.0)
Hemoglobin: 13.3 g/dL (ref 13.0–17.0)
Lymphocytes Relative: 16.8 % (ref 12.0–46.0)
Lymphs Abs: 0.8 10*3/uL (ref 0.7–4.0)
MCHC: 33.1 g/dL (ref 30.0–36.0)
MCV: 90.7 fl (ref 78.0–100.0)
Monocytes Absolute: 0.7 10*3/uL (ref 0.1–1.0)
Monocytes Relative: 14.3 % — ABNORMAL HIGH (ref 3.0–12.0)
Neutro Abs: 3.2 10*3/uL (ref 1.4–7.7)
Neutrophils Relative %: 65.1 % (ref 43.0–77.0)
Platelets: 128 10*3/uL — ABNORMAL LOW (ref 150.0–400.0)
RBC: 4.42 Mil/uL (ref 4.22–5.81)
RDW: 15.2 % (ref 11.5–15.5)
WBC: 5 10*3/uL (ref 4.0–10.5)

## 2022-07-11 LAB — LIPID PANEL
Cholesterol: 93 mg/dL (ref 0–200)
HDL: 44.2 mg/dL (ref >39.00)
LDL Cholesterol: 37 mg/dL (ref 0–99)
NonHDL: 49.27
Total CHOL/HDL Ratio: 2
Triglycerides: 63 mg/dL (ref 0.0–149.0)
VLDL: 12.6 mg/dL (ref 0.0–40.0)

## 2022-07-11 LAB — COMPREHENSIVE METABOLIC PANEL
ALT: 19 U/L (ref 0–53)
AST: 24 U/L (ref 0–37)
Albumin: 3.8 g/dL (ref 3.5–5.2)
Alkaline Phosphatase: 88 U/L (ref 39–117)
BUN: 18 mg/dL (ref 6–23)
CO2: 27 mEq/L (ref 19–32)
Calcium: 8.8 mg/dL (ref 8.4–10.5)
Chloride: 105 mEq/L (ref 96–112)
Creatinine, Ser: 1.08 mg/dL (ref 0.40–1.50)
GFR: 62.88 mL/min (ref >60.00)
Glucose, Bld: 90 mg/dL (ref 70–99)
Potassium: 4.4 mEq/L (ref 3.5–5.1)
Sodium: 139 mEq/L (ref 135–145)
Total Bilirubin: 0.9 mg/dL (ref 0.2–1.2)
Total Protein: 6.6 g/dL (ref 6.0–8.3)

## 2022-07-11 LAB — TSH: TSH: 1.9 u[IU]/mL (ref 0.35–5.50)

## 2022-07-19 DIAGNOSIS — M79671 Pain in right foot: Secondary | ICD-10-CM | POA: Diagnosis not present

## 2022-07-19 DIAGNOSIS — B351 Tinea unguium: Secondary | ICD-10-CM | POA: Diagnosis not present

## 2022-07-19 DIAGNOSIS — M79672 Pain in left foot: Secondary | ICD-10-CM | POA: Diagnosis not present

## 2022-07-23 ENCOUNTER — Other Ambulatory Visit: Payer: Self-pay | Admitting: Family Medicine

## 2022-08-02 NOTE — Progress Notes (Signed)
Electrophysiology Office Note Date: 08/09/2022  ID:  Brian Davidson, DOB March 16, 1936, MRN [id number]  PCP: Brian Lukes, MD Primary Cardiologist: Brian Grooms, MD Electrophysiologist: Brian. Rayann Davidson -> Brian. Quentin Davidson  CC: Pacemaker follow-up  Brian Davidson is a 86 y.o. male seen today for Brian Epley, MD for routine electrophysiology followup. Since last being seen in our clinic the patient reports doing very well.  he denies chest pain, palpitations, dyspnea, PND, orthopnea, nausea, vomiting, dizziness, syncope, edema, weight gain, or early satiety.   Device History: St. Jude Single Chamber PPM implanted 2013 for symptomatic bradycardia / permanent AF  Past Medical History:  Diagnosis Date   Arthritis    Biliary dyskinesia    Carotid artery disease ([place]) 07/01/2016   Dyslipidemia 07/13/2017   Dysrhythmia    a fib   GERD (gastroesophageal reflux disease)    occasional   H/O measles    History of chicken pox    History of kidney stones    Hypertension    Neuromuscular disorder (HCC)    neuropathy feet    Persistent atrial fibrillation (HCC)    Pneumonia 1992   Presence of permanent cardiac pacemaker    Prostate cancer ([place])    Tachycardia-bradycardia ([place])    a. s/p STJ dual chamber pacemaker   Thiamine deficiency 01/19/2018   Valvular heart disease 07/01/2016   Past Surgical History:  Procedure Laterality Date   CHOLECYSTECTOMY N/A 11/24/2015   Procedure: LAPAROSCOPIC CHOLECYSTECTOMY;  Surgeon: Brian Pickerel, MD;  Location: [facility];  Service: General;  Laterality: N/A;   CYSTOSCOPY  2016   EYE SURGERY     bil cataract   FRACTURE SURGERY  2005   left ankle   HERNIA REPAIR     left groin, no mesh   PERMANENT PACEMAKER INSERTION N/A 01/31/2012   SJM Accent SR RF implanted by Brian Davidson   TOTAL KNEE ARTHROPLASTY Right 12/23/2019   Procedure: TOTAL KNEE ARTHROPLASTY;  Surgeon: Brian Arabian, MD;  Location: [facility];  Service: Orthopedics;  Laterality: Right;   39mn    Current Outpatient Medications  Medication Sig Dispense Refill   Calcium Carbonate-Vitamin D (CALTRATE 600+D PO) Take 600 mg by mouth daily.      Cholecalciferol 50 MCG (2000 UT) TABS Take 2,000 Units by mouth daily.      diltiazem (CARDIZEM CD) 240 MG 24 hr capsule TAKE ONE (1) CAPSULE EACH DAY BY MOUTH 90 capsule 1   fluticasone (FLONASE) 50 MCG/ACT nasal spray Use 2 spray(s) in each nostril once daily 16 g 5   gabapentin (NEURONTIN) 300 MG capsule Take 1 capsule by mouth twice daily 180 capsule 0   losartan (COZAAR) 25 MG tablet Take 1 tablet (25 mg total) by mouth daily. 90 tablet 1   metoprolol tartrate (LOPRESSOR) 100 MG tablet Take 1 tablet by mouth twice daily 180 tablet 1   Multiple Vitamins-Minerals (MULTIVITAMIN WITH MINERALS) tablet Take 1 tablet by mouth daily. 30 tablet 3   rosuvastatin (CRESTOR) 10 MG tablet Take 1 tablet (10 mg total) by mouth daily. 90 tablet 3   Turmeric 500 MG TABS Take 500 mg by mouth daily.      XARELTO 20 MG TABS tablet Take 1 tablet by mouth once daily 90 tablet 0   No current facility-administered medications for this visit.    Allergies:   Cardizem [diltiazem] and Sulfa antibiotics   Social History: Social History   Socioeconomic History   Marital status: Married    Spouse  name: Brian Davidson   Number of children: 1   Years of education: Not on file   Highest education level: Not on file  Occupational History   Occupation: Retired   Occupation: Dance movement psychotherapist for car [employer]  Tobacco Use   Smoking status: Former    Types: Pipe, Cigars    Quit date: 11/18/1985    Years since quitting: 36.7   Smokeless tobacco: Never  Vaping Use   Vaping Use: Never used  Substance and Sexual Activity   Alcohol use: Yes    Alcohol/week: 3.0 standard drinks of alcohol    Types: 3 Cans of beer per week    Comment: occ   Drug use: No   Sexual activity: Not Currently  Other Topics Concern   Not on file  Social History Narrative   Lives with  wife, still works part-time as a Geophysicist/field seismologist. He is active around the house and yard...   Parents are deceased and did not have cardiac issues but he has 2 siblings with atrial fibrillation and a sister-in-law  has a pacemaker.   Social Determinants of Health   Financial Resource Strain: Low Risk  (01/27/2022)   Overall Financial Resource Strain (CARDIA)    Difficulty of Paying Living Expenses: Not very hard  Food Insecurity: No Food Insecurity (08/23/2021)   Hunger Vital Sign    Worried About Running Out of Food in the Last Year: Never true    Ran Out of Food in the Last Year: Never true  Transportation Needs: No Transportation Needs (08/23/2021)   PRAPARE - Hydrologist (Medical): No    Lack of Transportation (Non-Medical): No  Physical Activity: Sufficiently Active (08/23/2021)   Exercise Vital Sign    Days of Exercise per Week: 4 days    Minutes of Exercise per Session: 60 min  Stress: No Stress Concern Present (08/23/2021)   [place]    Feeling of Stress : Not at all  Social Connections: Moderately Isolated (08/23/2021)   Social Connection and Isolation Panel [NHANES]    Frequency of Communication with Friends and Family: Three times a week    Frequency of Social Gatherings with Friends and Family: Three times a week    Attends Religious Services: Never    Active Member of Clubs or Organizations: No    Attends Archivist Meetings: Never    Marital Status: Married  Human resources officer Violence: Not At Risk (08/23/2021)   Humiliation, Afraid, Rape, and Kick questionnaire    Fear of Current or Ex-Partner: No    Emotionally Abused: No    Physically Abused: No    Sexually Abused: No    Family History: Family History  Problem Relation Age of Onset   Neuropathy Mother    Hypertension Mother    Arthritis Mother    Drug abuse Mother    Stroke Father    Heart disease Father     Prostate cancer Father        not treated   Heart disease Sister 43   Heart disease Sister    Heart disease Brother    Neuropathy Brother    Hypertension Daughter    Colon cancer Neg Hx      Review of Systems: All other systems reviewed and are otherwise negative except as noted above.  Physical Exam: Vitals:   08/09/22 0916  BP: 134/60  Pulse: 64  SpO2: 99%  Weight: 205 lb 6.4 oz (93.2  kg)  Height: 6' (1.829 m)     GEN- The patient is well appearing, alert and oriented x 3 today.   HEENT: normocephalic, atraumatic; sclera clear, conjunctiva pink; hearing intact; oropharynx clear; neck supple, no JVP Lymph- no cervical lymphadenopathy Lungs- Clear to ausculation bilaterally, normal work of breathing.  No wheezes, rales, rhonchi Heart- Irregular  rate and rhythm, no murmurs, rubs or gallops, PMI not laterally displaced GI- soft, non-tender, non-distended, bowel sounds present, no hepatosplenomegaly Extremities- no clubbing or cyanosis. No peripheral edema; DP/PT/radial pulses 2+ bilaterally MS- no significant deformity or atrophy Skin- warm and dry, no rash or lesion; PPM pocket well healed Psych- euthymic mood, full affect Neuro- strength and sensation are intact  PPM Interrogation-  reviewed in detail today,  See PACEART report.  EKG:  EKG is not ordered today.  Recent Labs: 12/26/2021: B Natriuretic Peptide 325.0 04/09/2022: Magnesium 1.9 07/11/2022: ALT 19; BUN 18; Creatinine, Ser 1.08; Hemoglobin 13.3; Platelets 128.0; Potassium 4.4; Sodium 139; TSH 1.90   Wt Readings from Last 3 Encounters:  08/09/22 205 lb 6.4 oz (93.2 kg)  04/09/22 206 lb (93.4 kg)  03/29/22 206 lb (93.4 kg)     Other studies Reviewed: Additional studies/ records that were reviewed today include: Previous EP office notes, Previous remote checks, Most recent labwork.   Assessment and Plan:  1. Symptomatic bradycardia s/p St. Jude PPM  Normal PPM function See Pace Art report No changes  today  2. Permanent AF Rate controlled Continue Xarelto for CHA2DS2VASc of at least 4  3. HTN Stable on current regimen   Current medicines are reviewed at length with the patient today.    Labs/ stable 11/20203   Disposition:   Follow up with Brian. Quentin Davidson in 12 months    Signed, Shirley Friar, PA-C  08/09/2022 9:23 AM  [facility] [address] [place] [address] [phone] (office) [phone] (fax)

## 2022-08-09 ENCOUNTER — Encounter: Payer: Self-pay | Admitting: Student

## 2022-08-09 ENCOUNTER — Ambulatory Visit: Payer: PPO | Attending: Student | Admitting: Student

## 2022-08-09 VITALS — BP 134/60 | HR 64 | Ht 72.0 in | Wt 205.4 lb

## 2022-08-09 DIAGNOSIS — I495 Sick sinus syndrome: Secondary | ICD-10-CM | POA: Diagnosis not present

## 2022-08-09 DIAGNOSIS — I10 Essential (primary) hypertension: Secondary | ICD-10-CM

## 2022-08-09 DIAGNOSIS — I4821 Permanent atrial fibrillation: Secondary | ICD-10-CM | POA: Diagnosis not present

## 2022-08-09 LAB — CUP PACEART INCLINIC DEVICE CHECK
Battery Remaining Longevity: 42 mo
Battery Voltage: 2.92 V
Brady Statistic RV Percent Paced: 19 %
Date Time Interrogation Session: 20231205093810
Implantable Lead Connection Status: 753985
Implantable Lead Implant Date: 20130528
Implantable Lead Location: 753860
Implantable Lead Model: 1948
Implantable Pulse Generator Implant Date: 20130528
Lead Channel Impedance Value: 675 Ohm
Lead Channel Pacing Threshold Amplitude: 0.75 V
Lead Channel Pacing Threshold Amplitude: 0.75 V
Lead Channel Pacing Threshold Pulse Width: 0.4 ms
Lead Channel Pacing Threshold Pulse Width: 0.4 ms
Lead Channel Sensing Intrinsic Amplitude: 12 mV
Lead Channel Setting Pacing Amplitude: 2.5 V
Lead Channel Setting Pacing Pulse Width: 0.4 ms
Lead Channel Setting Sensing Sensitivity: 2 mV
Pulse Gen Model: 1210
Pulse Gen Serial Number: 7328888

## 2022-08-09 NOTE — Patient Instructions (Signed)
Medication Instructions:  Your physician recommends that you continue on your current medications as directed. Please refer to the Current Medication list given to you today.  *If you need a refill on your cardiac medications before your next appointment, please call your pharmacy*   Lab Work: None If you have labs (blood work) drawn today and your tests are completely normal, you will receive your results only by: [place] (if you have MyChart) OR A paper copy in the mail If you have any lab test that is abnormal or we need to change your treatment, we will call you to review the results.  Follow-Up: At [facility], you and your health needs are our priority.  As part of our continuing mission to provide you with exceptional heart care, we have created designated Provider Care Teams.  These Care Teams include your primary Cardiologist (physician) and Advanced Practice Providers (APPs -  Physician Assistants and Nurse Practitioners) who all work together to provide you with the care you need, when you need it.  Your next appointment:   1 year(s)  The format for your next appointment:   In Person  Provider:   Lars Mage, MD     Important Information About Sugar

## 2022-08-16 ENCOUNTER — Other Ambulatory Visit: Payer: Self-pay | Admitting: Family Medicine

## 2022-08-25 ENCOUNTER — Other Ambulatory Visit: Payer: Self-pay | Admitting: Family Medicine

## 2022-08-25 ENCOUNTER — Telehealth: Payer: Self-pay | Admitting: Family Medicine

## 2022-08-25 MED ORDER — DICLOFENAC SODIUM 1 % EX GEL: 2.0000 g | Freq: Four times a day (QID) | CUTANEOUS | 2 refills | Status: DC

## 2022-08-25 NOTE — Telephone Encounter (Signed)
Patient states he is having a neuropathy flare up on his left leg and would like some cream to be sent it. He stated he got a cream called voltrim was sent to him last time. Please advise.

## 2022-08-31 ENCOUNTER — Ambulatory Visit (INDEPENDENT_AMBULATORY_CARE_PROVIDER_SITE_OTHER): Payer: PPO

## 2022-08-31 DIAGNOSIS — I495 Sick sinus syndrome: Secondary | ICD-10-CM

## 2022-09-01 LAB — CUP PACEART REMOTE DEVICE CHECK
Battery Remaining Longevity: 41 mo
Battery Remaining Percentage: 29 %
Battery Voltage: 2.92 V
Brady Statistic RV Percent Paced: 29 %
Date Time Interrogation Session: 20231227034638
Implantable Lead Connection Status: 753985
Implantable Lead Implant Date: 20130528
Implantable Lead Location: 753860
Implantable Lead Model: 1948
Implantable Pulse Generator Implant Date: 20130528
Lead Channel Impedance Value: 650 Ohm
Lead Channel Pacing Threshold Amplitude: 0.75 V
Lead Channel Pacing Threshold Pulse Width: 0.4 ms
Lead Channel Sensing Intrinsic Amplitude: 12 mV
Lead Channel Setting Pacing Amplitude: 2.5 V
Lead Channel Setting Pacing Pulse Width: 0.4 ms
Lead Channel Setting Sensing Sensitivity: 2 mV
Pulse Gen Model: 1210
Pulse Gen Serial Number: 7328888

## 2022-09-06 ENCOUNTER — Other Ambulatory Visit: Payer: Self-pay | Admitting: Family Medicine

## 2022-09-19 DIAGNOSIS — M21619 Bunion of unspecified foot: Secondary | ICD-10-CM | POA: Diagnosis not present

## 2022-09-19 DIAGNOSIS — M2041 Other hammer toe(s) (acquired), right foot: Secondary | ICD-10-CM | POA: Diagnosis not present

## 2022-09-19 DIAGNOSIS — M21611 Bunion of right foot: Secondary | ICD-10-CM | POA: Diagnosis not present

## 2022-09-19 DIAGNOSIS — M79671 Pain in right foot: Secondary | ICD-10-CM | POA: Diagnosis not present

## 2022-09-20 NOTE — Progress Notes (Signed)
Remote pacemaker transmission.   

## 2022-09-21 DIAGNOSIS — M79672 Pain in left foot: Secondary | ICD-10-CM | POA: Diagnosis not present

## 2022-09-21 DIAGNOSIS — B351 Tinea unguium: Secondary | ICD-10-CM | POA: Diagnosis not present

## 2022-09-21 DIAGNOSIS — M79671 Pain in right foot: Secondary | ICD-10-CM | POA: Diagnosis not present

## 2022-09-27 ENCOUNTER — Other Ambulatory Visit: Payer: Self-pay | Admitting: Family Medicine

## 2022-10-11 DIAGNOSIS — L570 Actinic keratosis: Secondary | ICD-10-CM | POA: Diagnosis not present

## 2022-11-23 DIAGNOSIS — M79671 Pain in right foot: Secondary | ICD-10-CM | POA: Diagnosis not present

## 2022-11-23 DIAGNOSIS — M79672 Pain in left foot: Secondary | ICD-10-CM | POA: Diagnosis not present

## 2022-11-23 DIAGNOSIS — B351 Tinea unguium: Secondary | ICD-10-CM | POA: Diagnosis not present

## 2022-11-30 ENCOUNTER — Ambulatory Visit (INDEPENDENT_AMBULATORY_CARE_PROVIDER_SITE_OTHER): Payer: PPO

## 2022-11-30 DIAGNOSIS — I495 Sick sinus syndrome: Secondary | ICD-10-CM | POA: Diagnosis not present

## 2022-11-30 LAB — CUP PACEART REMOTE DEVICE CHECK
Battery Remaining Longevity: 38 mo
Battery Remaining Percentage: 28 %
Battery Voltage: 2.9 V
Brady Statistic RV Percent Paced: 23 %
Date Time Interrogation Session: 20240327032318
Implantable Lead Connection Status: 753985
Implantable Lead Implant Date: 20130528
Implantable Lead Location: 753860
Implantable Lead Model: 1948
Implantable Pulse Generator Implant Date: 20130528
Lead Channel Impedance Value: 660 Ohm
Lead Channel Pacing Threshold Amplitude: 0.75 V
Lead Channel Pacing Threshold Pulse Width: 0.4 ms
Lead Channel Sensing Intrinsic Amplitude: 12 mV
Lead Channel Setting Pacing Amplitude: 2.5 V
Lead Channel Setting Pacing Pulse Width: 0.4 ms
Lead Channel Setting Sensing Sensitivity: 2 mV
Pulse Gen Model: 1210
Pulse Gen Serial Number: 7328888

## 2022-12-04 ENCOUNTER — Other Ambulatory Visit: Payer: Self-pay | Admitting: Family Medicine

## 2022-12-14 NOTE — Assessment & Plan Note (Signed)
hgba1c acceptable, minimize simple carbs. Increase exercise as tolerated.  

## 2022-12-14 NOTE — Assessment & Plan Note (Signed)
Added Losartan at last visit

## 2022-12-14 NOTE — Assessment & Plan Note (Signed)
Patient encouraged to maintain heart healthy diet, regular exercise, adequate sleep. Consider daily probiotics. Take medications as prescribed Labs ordered and reviewed. Given and reviewed copy of ACP documents from [employer] and encouraged to complete and return. Has aged out of colonoscopies

## 2022-12-14 NOTE — Assessment & Plan Note (Signed)
Hydrate and monitor 

## 2022-12-14 NOTE — Assessment & Plan Note (Signed)
Encourage heart healthy diet such as MIND or DASH diet, increase exercise, avoid trans fats, simple carbohydrates and processed foods, consider a krill or fish or flaxseed oil cap daily. Tolerating Rosuvastatin 

## 2022-12-14 NOTE — Assessment & Plan Note (Signed)
Needs to maintain high protein intake and will monitor

## 2022-12-14 NOTE — Assessment & Plan Note (Signed)
Supplement and monitor 

## 2022-12-15 ENCOUNTER — Ambulatory Visit (INDEPENDENT_AMBULATORY_CARE_PROVIDER_SITE_OTHER): Payer: PPO | Admitting: Family Medicine

## 2022-12-15 VITALS — BP 138/74 | HR 67 | Temp 97.8°F | Resp 16 | Ht 72.0 in | Wt 201.2 lb

## 2022-12-15 DIAGNOSIS — E782 Mixed hyperlipidemia: Secondary | ICD-10-CM

## 2022-12-15 DIAGNOSIS — E8809 Other disorders of plasma-protein metabolism, not elsewhere classified: Secondary | ICD-10-CM

## 2022-12-15 DIAGNOSIS — E519 Thiamine deficiency, unspecified: Secondary | ICD-10-CM | POA: Diagnosis not present

## 2022-12-15 DIAGNOSIS — R739 Hyperglycemia, unspecified: Secondary | ICD-10-CM | POA: Diagnosis not present

## 2022-12-15 DIAGNOSIS — Z0000 Encounter for general adult medical examination without abnormal findings: Secondary | ICD-10-CM

## 2022-12-15 DIAGNOSIS — I10 Essential (primary) hypertension: Secondary | ICD-10-CM

## 2022-12-15 DIAGNOSIS — N189 Chronic kidney disease, unspecified: Secondary | ICD-10-CM

## 2022-12-15 LAB — CBC WITH DIFFERENTIAL/PLATELET
Basophils Absolute: 0 10*3/uL (ref 0.0–0.1)
Basophils Relative: 0.7 % (ref 0.0–3.0)
Eosinophils Absolute: 0.2 10*3/uL (ref 0.0–0.7)
Eosinophils Relative: 3.2 % (ref 0.0–5.0)
HCT: 42.9 % (ref 39.0–52.0)
Hemoglobin: 14.3 g/dL (ref 13.0–17.0)
Lymphocytes Relative: 17.9 % (ref 12.0–46.0)
Lymphs Abs: 1.1 10*3/uL (ref 0.7–4.0)
MCHC: 33.2 g/dL (ref 30.0–36.0)
MCV: 93.3 fl (ref 78.0–100.0)
Monocytes Absolute: 0.9 10*3/uL (ref 0.1–1.0)
Monocytes Relative: 15.1 % — ABNORMAL HIGH (ref 3.0–12.0)
Neutro Abs: 3.8 10*3/uL (ref 1.4–7.7)
Neutrophils Relative %: 63.1 % (ref 43.0–77.0)
Platelets: 132 10*3/uL — ABNORMAL LOW (ref 150.0–400.0)
RBC: 4.6 Mil/uL (ref 4.22–5.81)
RDW: 15 % (ref 11.5–15.5)
WBC: 6 10*3/uL (ref 4.0–10.5)

## 2022-12-15 LAB — COMPREHENSIVE METABOLIC PANEL
ALT: 21 U/L (ref 0–53)
AST: 27 U/L (ref 0–37)
Albumin: 3.9 g/dL (ref 3.5–5.2)
Alkaline Phosphatase: 89 U/L (ref 39–117)
BUN: 21 mg/dL (ref 6–23)
CO2: 28 mEq/L (ref 19–32)
Calcium: 9 mg/dL (ref 8.4–10.5)
Chloride: 106 mEq/L (ref 96–112)
Creatinine, Ser: 1.22 mg/dL (ref 0.40–1.50)
GFR: 54.16 mL/min — ABNORMAL LOW (ref >60.00)
Glucose, Bld: 84 mg/dL (ref 70–99)
Potassium: 4.1 mEq/L (ref 3.5–5.1)
Sodium: 142 mEq/L (ref 135–145)
Total Bilirubin: 1 mg/dL (ref 0.2–1.2)
Total Protein: 6.5 g/dL (ref 6.0–8.3)

## 2022-12-15 LAB — LIPID PANEL
Cholesterol: 106 mg/dL (ref 0–200)
HDL: 51.7 mg/dL (ref >39.00)
LDL Cholesterol: 43 mg/dL (ref 0–99)
NonHDL: 54.18
Total CHOL/HDL Ratio: 2
Triglycerides: 55 mg/dL (ref 0.0–149.0)
VLDL: 11 mg/dL (ref 0.0–40.0)

## 2022-12-15 LAB — TSH: TSH: 1.78 u[IU]/mL (ref 0.35–5.50)

## 2022-12-15 LAB — HEMOGLOBIN A1C: Hgb A1c MFr Bld: 5.6 % (ref 4.6–6.5)

## 2022-12-15 NOTE — Progress Notes (Signed)
Subjective:    Patient ID: Brian Davidson, male    DOB: 01-08-1936, 87 y.o.   MRN: [id number]  Chief Complaint  Patient presents with   Follow-up    Follow up    HPI Patient is in today for follow up on chronic medical concerns. No recent febrile illness or hospitalizations. Denies CP/palp/SOB/HA/congestion/fevers/GI or GU c/o. Taking meds as prescribed. He denies any acute concerns. He is walking regularly and eating a hart healthy diet. His taste is still not good since his COVID shots. He notes sweet tastes sour.   Past Medical History:  Diagnosis Date   Arthritis    Biliary dyskinesia    Carotid artery disease (HCC) 07/01/2016   Dyslipidemia 07/13/2017   Dysrhythmia    a fib   GERD (gastroesophageal reflux disease)    occasional   H/O measles    History of chicken pox    History of kidney stones    Hypertension    Neuromuscular disorder (HCC)    neuropathy feet    Persistent atrial fibrillation (HCC)    Pneumonia 1992   Presence of permanent cardiac pacemaker    Prostate cancer (HCC)    Tachycardia-bradycardia (HCC)    a. s/p STJ dual chamber pacemaker   Thiamine deficiency 01/19/2018   Valvular heart disease 07/01/2016    Past Surgical History:  Procedure Laterality Date   CHOLECYSTECTOMY N/A 11/24/2015   Procedure: LAPAROSCOPIC CHOLECYSTECTOMY;  Surgeon: Gaynelle Adu, MD;  Location: [facility];  Service: General;  Laterality: N/A;   CYSTOSCOPY  2016   EYE SURGERY     bil cataract   FRACTURE SURGERY  2005   left ankle   HERNIA REPAIR     left groin, no mesh   PERMANENT PACEMAKER INSERTION N/A 01/31/2012   SJM Accent SR RF implanted by DR Allred   TOTAL KNEE ARTHROPLASTY Right 12/23/2019   Procedure: TOTAL KNEE ARTHROPLASTY;  Surgeon: Ollen Gross, MD;  Location: [facility];  Service: Orthopedics;  Laterality: Right;     Family History  Problem Relation Age of Onset   Neuropathy Mother    Hypertension Mother    Arthritis Mother    Drug abuse Mother     Stroke Father    Heart disease Father    Prostate cancer Father        not treated   Heart disease Sister 38   Heart disease Sister    Heart disease Brother    Neuropathy Brother    Hypertension Daughter    Colon cancer Neg Hx     Social History   Socioeconomic History   Marital status: Married    Spouse name: Bonita Quin   Number of children: 1   Years of education: Not on file   Highest education level: Not on file  Occupational History   Occupation: Retired   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 He is active around the house and yard...   Parents are deceased and did not have cardiac issues but he has 2 siblings with atrial fibrillation and a sister-in-law  has a pacemaker.   Social Determinants of Health   Financial Resource Strain: Low Risk  (01/27/2022)   Overall Financial Resource Strain (CARDIA)    Difficulty of Paying Living Expenses: Not very hard  Food Insecurity: No Food Insecurity (08/23/2021)   Hunger Vital Sign    Worried About Running Out of Food in the Last Year: Never true    Ran Out of Food in the Last Year: Never true  Transportation Needs: No Transportation Needs (08/23/2021)   PRAPARE - Administrator, Civil Service (Medical): No    Lack of Transportation (Non-Medical): No  Physical Activity: Sufficiently Active (08/23/2021)   Exercise Vital Sign    Days of Exercise per Week: 4 days    Minutes of Exercise per Session: 60 min  Stress: No  Stress Concern Present (08/23/2021)   [employer] of Occupational Health - Occupational Stress Questionnaire    Feeling of Stress : Not at all  Social Connections: Moderately Isolated (08/23/2021)   Social Connection and Isolation Panel [NHANES]    Frequency of Communication with Friends and Family: Three times a week    Frequency of Social Gatherings with Friends and Family: Three times a week    Attends Religious Services: Never    Active Member of Clubs or Organizations: No    Attends Banker Meetings: Never    Marital Status: Married  Catering manager Violence: Not At Risk (08/23/2021)   Humiliation, Afraid, Rape, and Kick questionnaire    Fear of Current or Ex-Partner: No    Emotionally Abused: No    Physically Abused: No    Sexually Abused: No    Outpatient Medications Prior to Visit  Medication Sig Dispense Refill   Calcium Carbonate-Vitamin D (CALTRATE 600+D PO) Take 600 mg by mouth daily.      Cholecalciferol 50 MCG (2000 UT) TABS Take 2,000 Units by mouth daily.      diclofenac Sodium (VOLTAREN) 1 % GEL Apply 2 g topically 4 (four) times daily. 350 g 2   diltiazem (CARDIZEM CD) 240 MG 24 hr capsule Take 1 capsule (240 mg total) by mouth daily. 90 capsule 1   fluticasone (FLONASE) 50 MCG/ACT nasal spray Use 2 spray(s) in each nostril once daily 16 g 5   gabapentin (NEURONTIN) 300 MG capsule Take 1 capsule (300 mg total) by mouth 2 (two) times daily. 180 capsule 1   losartan (COZAAR) 25 MG tablet Take 1 tablet (25 mg total) by mouth daily. 90 tablet 1   metoprolol tartrate (LOPRESSOR) 100 MG tablet Take 1 tablet by mouth twice daily 180 tablet 0   Multiple Vitamins-Minerals (MULTIVITAMIN WITH MINERALS) tablet Take 1 tablet by mouth daily. 30 tablet 3   rosuvastatin (CRESTOR) 10 MG tablet Take 1 tablet (10 mg total) by mouth daily. 90 tablet 3   Turmeric 500 MG TABS Take 500 mg by mouth daily.      XARELTO 20 MG TABS tablet Take 1 tablet by mouth once daily  90 tablet 0   No facility-administered medications prior to visit.    Allergies  Allergen Reactions   Cardizem [Diltiazem]     Rash from Yellow dye in generic capsule.   Can take different brand. Takes diltiazem - his allergy is to Cardizem   Sulfa  Antibiotics     Unknown childhood reaction    Review of Systems  Constitutional:  Negative for fever and malaise/fatigue.  HENT:  Negative for congestion.   Eyes:  Negative for blurred vision.  Respiratory:  Negative for shortness of breath.   Cardiovascular:  Negative for chest pain, palpitations and leg swelling.  Gastrointestinal:  Negative for abdominal pain, blood in stool and nausea.  Genitourinary:  Negative for dysuria and frequency.  Musculoskeletal:  Negative for falls.  Skin:  Negative for rash.  Neurological:  Negative for dizziness, loss of consciousness and headaches.  Endo/Heme/Allergies:  Negative for environmental allergies.  Psychiatric/Behavioral:  Negative for depression. The patient is not nervous/anxious.        Objective:    Physical Exam Constitutional:      General: He is not in acute distress.    Appearance: Normal appearance. He is not ill-appearing or toxic-appearing.  HENT:     Head: Normocephalic and atraumatic.     Right Ear: External ear normal.     Left Ear: External ear normal.     Nose: Nose normal. No congestion.     Mouth/Throat:     Mouth: Mucous membranes are moist.  Eyes:     General:        Right eye: No discharge.        Left eye: No discharge.     Extraocular Movements: Extraocular movements intact.     Conjunctiva/sclera: Conjunctivae normal.  Cardiovascular:     Heart sounds: No murmur heard. Pulmonary:     Effort: Pulmonary effort is normal.  Abdominal:     General: Bowel sounds are normal.     Tenderness: There is no abdominal tenderness.  Skin:    Findings: No rash.  Neurological:     Mental Status: He is alert and oriented to person, place, and time.  Psychiatric:         Behavior: Behavior normal.     BP 138/74 (BP Location: Right Arm, Patient Position: Sitting, Cuff Size: Normal)   Pulse 67   Temp 97.8 F (36.6 C) (Oral)   Resp 16   Ht 6' (1.829 m)   Wt 201 lb 3.2 oz (91.3 kg)   SpO2 97%   BMI 27.29 kg/m  Wt Readings from Last 3 Encounters:  12/15/22 201 lb 3.2 oz (91.3 kg)  08/09/22 205 lb 6.4 oz (93.2 kg)  04/09/22 206 lb (93.4 kg)    Diabetic Foot Exam - Simple   No data filed    Lab Results  Component Value Date   WBC 5.0 07/11/2022   HGB 13.3 07/11/2022   HCT 40.1 07/11/2022   PLT 128.0 (L) 07/11/2022   GLUCOSE 90 07/11/2022   CHOL 93 07/11/2022   TRIG 63.0 07/11/2022   HDL 44.20 07/11/2022   LDLCALC 37 07/11/2022   ALT 19 07/11/2022   AST 24 07/11/2022   NA 139 07/11/2022   K 4.4 07/11/2022   CL 105 07/11/2022   CREATININE 1.08 07/11/2022   BUN 18 07/11/2022   CO2 27 07/11/2022   TSH 1.90 07/11/2022   INR 1.5 (H) 12/16/2019   HGBA1C 5.7 07/11/2022    Lab Results  Component Value Date   TSH 1.90 07/11/2022   Lab Results  Component Value Date   WBC 5.0 07/11/2022   HGB 13.3 07/11/2022   HCT 40.1 07/11/2022   MCV 90.7 07/11/2022   PLT 128.0 (L) 07/11/2022   Lab Results  Component Value Date   NA  139 07/11/2022   K 4.4 07/11/2022   CO2 27 07/11/2022   GLUCOSE 90 07/11/2022   BUN 18 07/11/2022   CREATININE 1.08 07/11/2022   BILITOT 0.9 07/11/2022   ALKPHOS 88 07/11/2022   AST 24 07/11/2022   ALT 19 07/11/2022   PROT 6.6 07/11/2022   ALBUMIN 3.8 07/11/2022   CALCIUM 8.8 07/11/2022   ANIONGAP 5 04/09/2022   GFR 62.88 07/11/2022   Lab Results  Component Value Date   CHOL 93 07/11/2022   Lab Results  Component Value Date   HDL 44.20 07/11/2022   Lab Results  Component Value Date   LDLCALC 37 07/11/2022   Lab Results  Component Value Date   TRIG 63.0 07/11/2022   Lab Results  Component Value Date   CHOLHDL 2 07/11/2022   Lab Results  Component Value Date   HGBA1C 5.7 07/11/2022        Assessment & Plan:  Chronic renal impairment, unspecified CKD stage Assessment & Plan: Hydrate and monitor    Hyperglycemia Assessment & Plan: hgba1c acceptable, minimize simple carbs. Increase exercise as tolerated.   Orders: -     Hemoglobin A1c  Mixed hyperlipidemia Assessment & Plan: Encourage heart healthy diet such as MIND or DASH diet, increase exercise, avoid trans fats, simple carbohydrates and processed foods, consider a krill or fish or flaxseed oil cap daily. Tolerating Rosuvastatin   Primary hypertension Assessment & Plan: Added Losartan at last visit  Orders: -     CBC with Differential/Platelet -     Comprehensive metabolic panel -     Lipid panel -     TSH  Hypoalbuminemia Assessment & Plan: Needs to maintain high protein intake and will monitor   Thiamine deficiency Assessment & Plan: Supplement and monitor   Orders: -     Vitamin B1  Preventative health care Assessment & Plan: Patient encouraged to maintain heart healthy diet, regular exercise, adequate sleep. Consider daily probiotics. Take medications as prescribed Labs ordered and reviewed. Given and reviewed copy of ACP documents from [employer] and encouraged to complete and return. Has aged out of colonoscopies     Danise Edge, MD

## 2022-12-15 NOTE — Patient Instructions (Signed)
RSV, Respiratory Syncitial Virus Vaccine, Arexvy at the pharmacy  Hypertension, Adult High blood pressure (hypertension) is when the force of blood pumping through the arteries is too strong. The arteries are the blood vessels that carry blood from the heart throughout the body. Hypertension forces the heart to work harder to pump blood and may cause arteries to become narrow or stiff. Untreated or uncontrolled hypertension can lead to a heart attack, heart failure, a stroke, kidney disease, and other problems. A blood pressure reading consists of a higher number over a lower number. Ideally, your blood pressure should be below 120/80. The first ("top") number is called the systolic pressure. It is a measure of the pressure in your arteries as your heart beats. The second ("bottom") number is called the diastolic pressure. It is a measure of the pressure in your arteries as the heart relaxes. What are the causes? The exact cause of this condition is not known. There are some conditions that result in high blood pressure. What increases the risk? Certain factors may make you more likely to develop high blood pressure. Some of these risk factors are under your control, including: Smoking. Not getting enough exercise or physical activity. Being overweight. Having too much fat, sugar, calories, or salt (sodium) in your diet. Drinking too much alcohol. Other risk factors include: Having a personal history of heart disease, diabetes, high cholesterol, or kidney disease. Stress. Having a family history of high blood pressure and high cholesterol. Having obstructive sleep apnea. Age. The risk increases with age. What are the signs or symptoms? High blood pressure may not cause symptoms. Very high blood pressure (hypertensive crisis) may cause: Headache. Fast or irregular heartbeats (palpitations). Shortness of breath. Nosebleed. Nausea and vomiting. Vision changes. Severe chest pain, dizziness,  and seizures. How is this diagnosed? This condition is diagnosed by measuring your blood pressure while you are seated, with your arm resting on a flat surface, your legs uncrossed, and your feet flat on the floor. The cuff of the blood pressure monitor will be placed directly against the skin of your upper arm at the level of your heart. Blood pressure should be measured at least twice using the same arm. Certain conditions can cause a difference in blood pressure between your right and left arms. If you have a high blood pressure reading during one visit or you have normal blood pressure with other risk factors, you may be asked to: Return on a different day to have your blood pressure checked again. Monitor your blood pressure at home for 1 week or longer. If you are diagnosed with hypertension, you may have other blood or imaging tests to help your health care provider understand your overall risk for other conditions. How is this treated? This condition is treated by making healthy lifestyle changes, such as eating healthy foods, exercising more, and reducing your alcohol intake. You may be referred for counseling on a healthy diet and physical activity. Your health care provider may prescribe medicine if lifestyle changes are not enough to get your blood pressure under control and if: Your systolic blood pressure is above 130. Your diastolic blood pressure is above 80. Your personal target blood pressure may vary depending on your medical conditions, your age, and other factors. Follow these instructions at home: Eating and drinking  Eat a diet that is high in fiber and potassium, and low in sodium, added sugar, and fat. An example of this eating plan is called the DASH diet. DASH stands for  Dietary Approaches to Stop Hypertension. To eat this way: Eat plenty of fresh fruits and vegetables. Try to fill one half of your plate at each meal with fruits and vegetables. Eat whole grains, such as  whole-wheat pasta, brown rice, or whole-grain bread. Fill about one fourth of your plate with whole grains. Eat or drink low-fat dairy products, such as skim milk or low-fat yogurt. Avoid fatty cuts of meat, processed or cured meats, and poultry with skin. Fill about one fourth of your plate with lean proteins, such as fish, chicken without skin, beans, eggs, or tofu. Avoid pre-made and processed foods. These tend to be higher in sodium, added sugar, and fat. Reduce your daily sodium intake. Many people with hypertension should eat less than 1,500 mg of sodium a day. Do not drink alcohol if: Your health care provider tells you not to drink. You are pregnant, may be pregnant, or are planning to become pregnant. If you drink alcohol: Limit how much you have to: 0-1 drink a day for women. 0-2 drinks a day for men. Know how much alcohol is in your drink. In the [place] one drink equals one 12 oz bottle of beer (355 mL), one 5 oz glass of wine (148 mL), or one 1 oz glass of hard liquor (44 mL). Lifestyle  Work with your health care provider to maintain a healthy body weight or to lose weight. Ask what an ideal weight is for you. Get at least 30 minutes of exercise that causes your heart to beat faster (aerobic exercise) most days of the week. Activities may include walking, swimming, or biking. Include exercise to strengthen your muscles (resistance exercise), such as Pilates or lifting weights, as part of your weekly exercise routine. Try to do these types of exercises for 30 minutes at least 3 days a week. Do not use any products that contain nicotine or tobacco. These products include cigarettes, chewing tobacco, and vaping devices, such as e-cigarettes. If you need help quitting, ask your health care provider. Monitor your blood pressure at home as told by your health care provider. Keep all follow-up visits. This is important. Medicines Take over-the-counter and prescription medicines only as  told by your health care provider. Follow directions carefully. Blood pressure medicines must be taken as prescribed. Do not skip doses of blood pressure medicine. Doing this puts you at risk for problems and can make the medicine less effective. Ask your health care provider about side effects or reactions to medicines that you should watch for. Contact a health care provider if you: Think you are having a reaction to a medicine you are taking. Have headaches that keep coming back (recurring). Feel dizzy. Have swelling in your ankles. Have trouble with your vision. Get help right away if you: Develop a severe headache or confusion. Have unusual weakness or numbness. Feel faint. Have severe pain in your chest or abdomen. Vomit repeatedly. Have trouble breathing. These symptoms may be an emergency. Get help right away. Call 911. Do not wait to see if the symptoms will go away. Do not drive yourself to the hospital. Summary Hypertension is when the force of blood pumping through your arteries is too strong. If this condition is not controlled, it may put you at risk for serious complications. Your personal target blood pressure may vary depending on your medical conditions, your age, and other factors. For most people, a normal blood pressure is less than 120/80. Hypertension is treated with lifestyle changes, medicines, or a combination  of both. Lifestyle changes include losing weight, eating a healthy, low-sodium diet, exercising more, and limiting alcohol. This information is not intended to replace advice given to you by your health care provider. Make sure you discuss any questions you have with your health care provider. Document Revised: 06/29/2021 Document Reviewed: 06/29/2021 Elsevier Patient Education  [place].

## 2022-12-18 ENCOUNTER — Other Ambulatory Visit: Payer: Self-pay | Admitting: Family Medicine

## 2022-12-18 LAB — VITAMIN B1: Vitamin B1 (Thiamine): 26 nmol/L (ref 8–30)

## 2022-12-19 ENCOUNTER — Encounter: Payer: Self-pay | Admitting: *Deleted

## 2022-12-20 ENCOUNTER — Telehealth: Payer: Self-pay | Admitting: Family Medicine

## 2022-12-20 NOTE — Telephone Encounter (Signed)
Contacted Brian Davidson to schedule their annual wellness visit. Appointment made for 01/02/2023.  Brian Davidson; Care Guide Ambulatory Clinical Support [facility] Direct Dial: [phone]

## 2023-01-02 ENCOUNTER — Ambulatory Visit (INDEPENDENT_AMBULATORY_CARE_PROVIDER_SITE_OTHER): Payer: PPO | Admitting: *Deleted

## 2023-01-02 DIAGNOSIS — Z0000 Encounter for general adult medical examination without abnormal findings: Secondary | ICD-10-CM

## 2023-01-02 NOTE — Patient Instructions (Signed)
Brian Davidson , Thank you for taking time to come for your Medicare Wellness Visit. I appreciate your ongoing commitment to your health goals. Please review the following plan we discussed and let me know if I can assist you in the future.   These are the goals we discussed:  Goals      Maintain current active lifestyle        This is a list of the screening recommended for you and due dates:  Health Maintenance  Topic Date Due   COVID-19 Vaccine (5 - 2023-24 season) 05/05/2023*   Flu Shot  04/06/2023   Medicare Annual Wellness Visit  01/02/2024   DTaP/Tdap/Td vaccine (3 - Td or Tdap) 05/28/2029   Pneumonia Vaccine  Completed   Zoster (Shingles) Vaccine  Completed   HPV Vaccine  Aged Out  *Topic was postponed. The date shown is not the original due date.     Next appointment: Follow up in one year for your annual wellness visit.   Preventive Care 22 Years and Older, Male Preventive care refers to lifestyle choices and visits with your health care provider that can promote health and wellness. What does preventive care include? A yearly physical exam. This is also called an annual well check. Dental exams once or twice a year. Routine eye exams. Ask your health care provider how often you should have your eyes checked. Personal lifestyle choices, including: Daily care of your teeth and gums. Regular physical activity. Eating a healthy diet. Avoiding tobacco and drug use. Limiting alcohol use. Practicing safe sex. Taking low doses of aspirin every day. Taking vitamin and mineral supplements as recommended by your health care provider. What happens during an annual well check? The services and screenings done by your health care provider during your annual well check will depend on your age, overall health, lifestyle risk factors, and family history of disease. Counseling  Your health care provider may ask you questions about your: Alcohol use. Tobacco use. Drug  use. Emotional well-being. Home and relationship well-being. Sexual activity. Eating habits. History of falls. Memory and ability to understand (cognition). Work and work Astronomer. Screening  You may have the following tests or measurements: Height, weight, and BMI. Blood pressure. Lipid and cholesterol levels. These may be checked every 5 years, or more frequently if you are over 9 years old. Skin check. Lung cancer screening. You may have this screening every year starting at age 65 if you have a 30-pack-year history of smoking and currently smoke or have quit within the past 15 years. Fecal occult blood test (FOBT) of the stool. You may have this test every year starting at age 50. Flexible sigmoidoscopy or colonoscopy. You may have a sigmoidoscopy every 5 years or a colonoscopy every 10 years starting at age 54. Prostate cancer screening. Recommendations will vary depending on your family history and other risks. Hepatitis C blood test. Hepatitis B blood test. Sexually transmitted disease (STD) testing. Diabetes screening. This is done by checking your blood sugar (glucose) after you have not eaten for a while (fasting). You may have this done every 1-3 years. Abdominal aortic aneurysm (AAA) screening. You may need this if you are a current or former smoker. Osteoporosis. You may be screened starting at age 7 if you are at high risk. Talk with your health care provider about your test results, treatment options, and if necessary, the need for more tests. Vaccines  Your health care provider may recommend certain vaccines, such as: Influenza vaccine.  This is recommended every year. Tetanus, diphtheria, and acellular pertussis (Tdap, Td) vaccine. You may need a Td booster every 10 years. Zoster vaccine. You may need this after age 49. Pneumococcal 13-valent conjugate (PCV13) vaccine. One dose is recommended after age 92. Pneumococcal polysaccharide (PPSV23) vaccine. One dose is  recommended after age 58. Talk to your health care provider about which screenings and vaccines you need and how often you need them. This information is not intended to replace advice given to you by your health care provider. Make sure you discuss any questions you have with your health care provider. Document Released: 09/18/2015 Document Revised: 05/11/2016 Document Reviewed: 06/23/2015 Elsevier Interactive Patient Education  2017 [place] Prevention in the Home Falls can cause injuries. They can happen to people of all ages. There are many things you can do to make your home safe and to help prevent falls. What can I do on the outside of my home? Regularly fix the edges of walkways and driveways and fix any cracks. Remove anything that might make you trip as you walk through a door, such as a raised step or threshold. Trim any bushes or trees on the path to your home. Use bright outdoor lighting. Clear any walking paths of anything that might make someone trip, such as rocks or tools. Regularly check to see if handrails are loose or broken. Make sure that both sides of any steps have handrails. Any raised decks and porches should have guardrails on the edges. Have any leaves, snow, or ice cleared regularly. Use sand or salt on walking paths during winter. Clean up any spills in your garage right away. This includes oil or grease spills. What can I do in the bathroom? Use night lights. Install grab bars by the toilet and in the tub and shower. Do not use towel bars as grab bars. Use non-skid mats or decals in the tub or shower. If you need to sit down in the shower, use a plastic, non-slip stool. Keep the floor dry. Clean up any water that spills on the floor as soon as it happens. Remove soap buildup in the tub or shower regularly. Attach bath mats securely with double-sided non-slip rug tape. Do not have throw rugs and other things on the floor that can make you  trip. What can I do in the bedroom? Use night lights. Make sure that you have a light by your bed that is easy to reach. Do not use any sheets or blankets that are too big for your bed. They should not hang down onto the floor. Have a firm chair that has side arms. You can use this for support while you get dressed. Do not have throw rugs and other things on the floor that can make you trip. What can I do in the kitchen? Clean up any spills right away. Avoid walking on wet floors. Keep items that you use a lot in easy-to-reach places. If you need to reach something above you, use a strong step stool that has a grab bar. Keep electrical cords out of the way. Do not use floor polish or wax that makes floors slippery. If you must use wax, use non-skid floor wax. Do not have throw rugs and other things on the floor that can make you trip. What can I do with my stairs? Do not leave any items on the stairs. Make sure that there are handrails on both sides of the stairs and use them. Fix handrails  that are broken or loose. Make sure that handrails are as long as the stairways. Check any carpeting to make sure that it is firmly attached to the stairs. Fix any carpet that is loose or worn. Avoid having throw rugs at the top or bottom of the stairs. If you do have throw rugs, attach them to the floor with carpet tape. Make sure that you have a light switch at the top of the stairs and the bottom of the stairs. If you do not have them, ask someone to add them for you. What else can I do to help prevent falls? Wear shoes that: Do not have high heels. Have rubber bottoms. Are comfortable and fit you well. Are closed at the toe. Do not wear sandals. If you use a stepladder: Make sure that it is fully opened. Do not climb a closed stepladder. Make sure that both sides of the stepladder are locked into place. Ask someone to hold it for you, if possible. Clearly mark and make sure that you can  see: Any grab bars or handrails. First and last steps. Where the edge of each step is. Use tools that help you move around (mobility aids) if they are needed. These include: Canes. Walkers. Scooters. Crutches. Turn on the lights when you go into a dark area. Replace any light bulbs as soon as they burn out. Set up your furniture so you have a clear path. Avoid moving your furniture around. If any of your floors are uneven, fix them. If there are any pets around you, be aware of where they are. Review your medicines with your doctor. Some medicines can make you feel dizzy. This can increase your chance of falling. Ask your doctor what other things that you can do to help prevent falls. This information is not intended to replace advice given to you by your health care provider. Make sure you discuss any questions you have with your health care provider. Document Released: 06/18/2009 Document Revised: 01/28/2016 Document Reviewed: 09/26/2014 Elsevier Interactive Patient Education  2017 [employer].

## 2023-01-02 NOTE — Progress Notes (Signed)
Subjective:  Pt completed ADLs, Fall risk, and SDOH during e-check in on 12/26/22.  Answers verified with pt.    Brian Davidson is a 87 y.o. male who presents for Medicare Annual/Subsequent preventive examination.  I connected with  Edward Qualia on 01/02/23 by a audio enabled telemedicine application and verified that I am speaking with the correct person using two identifiers.  Patient Location: Home  Provider Location: Office/Clinic  I discussed the limitations of evaluation and management by telemedicine. The patient expressed understanding and agreed to proceed.   Review of Systems     Cardiac Risk Factors include: advanced age (>51men, >41 women);male gender;dyslipidemia;hypertension     Objective:    There were no vitals filed for this visit. There is no height or weight on file to calculate BMI.     01/02/2023    1:40 PM 04/09/2022   12:54 PM 01/05/2022   10:53 AM 12/30/2021   11:05 AM 12/26/2021   10:46 AM 08/17/2021   10:16 AM 04/23/2021    2:56 PM  Advanced Directives  Does Patient Have a Medical Advance Directive? No No No No No Yes No  Type of Advance Directive      Healthcare Power of Attorney   Does patient want to make changes to medical advance directive?      No - Patient declined No - Patient declined  Copy of Healthcare Power of Attorney in Chart?      No - copy requested   Would patient like information on creating a medical advance directive? No - Patient declined No - Patient declined No - Patient declined No - Patient declined       Current Medications (verified) Outpatient Encounter Medications as of 01/02/2023  Medication Sig   Calcium Carbonate-Vitamin D (CALTRATE 600+D PO) Take 600 mg by mouth daily.    Cholecalciferol 50 MCG (2000 UT) TABS Take 2,000 Units by mouth daily.    diclofenac Sodium (VOLTAREN) 1 % GEL Apply 2 g topically 4 (four) times daily. (Patient taking differently: Apply 2 g topically 2 (two) times daily.)   diltiazem  (CARDIZEM CD) 240 MG 24 hr capsule Take 1 capsule (240 mg total) by mouth daily.   fluticasone (FLONASE) 50 MCG/ACT nasal spray Use 2 spray(s) in each nostril once daily (Patient taking differently: 2 sprays daily as needed.)   gabapentin (NEURONTIN) 300 MG capsule Take 1 capsule (300 mg total) by mouth 2 (two) times daily.   losartan (COZAAR) 25 MG tablet Take 1 tablet (25 mg total) by mouth daily.   metoprolol tartrate (LOPRESSOR) 100 MG tablet Take 1 tablet by mouth twice daily   Multiple Vitamins-Minerals (MULTIVITAMIN WITH MINERALS) tablet Take 1 tablet by mouth daily.   rivaroxaban (XARELTO) 20 MG TABS tablet Take 1 tablet by mouth once daily   rosuvastatin (CRESTOR) 10 MG tablet Take 1 tablet (10 mg total) by mouth daily.   Turmeric 500 MG TABS Take 500 mg by mouth daily.    No facility-administered encounter medications on file as of 01/02/2023.    Allergies (verified) Cardizem [diltiazem] and Sulfa antibiotics   History: Past Medical History:  Diagnosis Date   Allergy    Arthritis    Biliary dyskinesia    Carotid artery disease (HCC) 07/01/2016   Dyslipidemia 07/13/2017   Dysrhythmia    a fib   GERD (gastroesophageal reflux disease)    occasional   H/O measles    History of chicken pox    History of kidney  stones    Hypertension    Neuromuscular disorder (HCC)    neuropathy feet    Persistent atrial fibrillation (HCC)    Pneumonia 1992   Presence of permanent cardiac pacemaker    Prostate cancer (HCC)    Tachycardia-bradycardia (HCC)    a. s/p STJ dual chamber pacemaker   Thiamine deficiency 01/19/2018   Valvular heart disease 07/01/2016   Past Surgical History:  Procedure Laterality Date   CHOLECYSTECTOMY N/A 11/24/2015   Procedure: LAPAROSCOPIC CHOLECYSTECTOMY;  Surgeon: Gaynelle Adu, MD;  Location: [facility];  Service: General;  Laterality: N/A;   CYSTOSCOPY  2016   EYE SURGERY     bil cataract   FRACTURE SURGERY  2005   left ankle   HERNIA REPAIR     left  groin, no mesh   JOINT REPLACEMENT     PERMANENT PACEMAKER INSERTION N/A 01/31/2012   SJM Accent SR RF implanted by DR Allred   TOTAL KNEE ARTHROPLASTY Right 12/23/2019   Procedure: TOTAL KNEE ARTHROPLASTY;  Surgeon: Ollen Gross, MD;  Location: [facility];  Service: Orthopedics;  Laterality: Right;    Family History  Problem Relation Age of Onset   Neuropathy Mother    Hypertension Mother    Arthritis Mother    Drug abuse Mother    Stroke Father    Heart disease Father    Prostate cancer Father        not treated   Heart disease Sister 81   Heart disease Sister    Heart disease Brother    Neuropathy Brother    Hypertension Brother    Hypertension Daughter    Hypertension Son    Colon cancer Neg Hx    Social History   Socioeconomic History   Marital status: Married    Spouse name: Bonita Quin   Number of children: 1   Years of education: Not on file   Highest education level: Not on file  Occupational History   Occupation: Retired   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 He is active around the house and yard...   Parents are deceased and did not have cardiac issues but he has 2 siblings with atrial fibrillation and a sister-in-law  has a pacemaker.   Social Determinants of Health   Financial Resource Strain: Low Risk  (12/26/2022)   Overall Financial Resource Strain (CARDIA)    Difficulty of Paying Living Expenses: Not hard at all  Food Insecurity: No Food Insecurity  (12/26/2022)   Hunger Vital Sign    Worried About Running Out of Food in the Last Year: Never true    Ran Out of Food in the Last Year: Never true  Transportation Needs: No Transportation Needs (12/26/2022)   PRAPARE - Administrator, Civil Service (Medical): No    Lack of Transportation (Non-Medical): No  Physical Activity: Sufficiently Active (  12/26/2022)   Exercise Vital Sign    Days of Exercise per Week: 5 days    Minutes of Exercise per Session: 70 min  Stress: No Stress Concern Present (12/26/2022)   [employer] of Occupational Health - Occupational Stress Questionnaire    Feeling of Stress : Not at all  Social Connections: Unknown (12/26/2022)   Social Connection and Isolation Panel [NHANES]    Frequency of Communication with Friends and Family: Three times a week    Frequency of Social Gatherings with Friends and Family: Twice a week    Attends Religious Services: Not on Marketing executive or Organizations: No    Attends Banker Meetings: Never    Marital Status: Married    Tobacco Counseling Counseling given: Not Answered   Clinical Intake:  Pre-visit preparation completed: Yes  Pain : No/denies pain  Nutritional Risks: None Diabetes: No  How often do you need to have someone help you when you read instructions, pamphlets, or other written materials from your doctor or pharmacy?: 1 - Never   Activities of Daily Living    12/26/2022    9:35 AM  In your present state of health, do you have any difficulty performing the following activities:  Hearing? 1  Comment wears hearing aids  Vision? 0  Difficulty concentrating or making decisions? 1  Walking or climbing stairs? 0  Dressing or bathing? 0  Doing errands, shopping? 0  Preparing Food and eating ? N  Using the Toilet? N  In the past six months, have you accidently leaked urine? N  Do you have problems with loss of bowel control? N  Managing your Medications? N   Managing your Finances? N  Housekeeping or managing your Housekeeping? N    Patient Care Team: Bradd Canary, MD as PCP - General (Family Medicine) Corky Crafts, MD as PCP - Cardiology (Cardiology) Lanier Prude, MD as PCP - Electrophysiology (Cardiology) Hillis Range, MD (Inactive) as Consulting Physician (Cardiology) Sherrill Raring, MD as Consulting Physician (Family Medicine) Armbruster, Willaim Rayas, MD as Consulting Physician (Gastroenterology) Ollen Gross, MD as Consulting Physician (Orthopedic Surgery) Margaretmary Dys, MD as Consulting Physician (Radiation Oncology) Jerilee Field, MD as Consulting Physician (Urology) Axel Filler, Larna Daughters, NP as Nurse Practitioner (Hematology and Oncology) Maryclare Labrador, RN as Registered Nurse  Indicate any recent Medical Services you may have received from other than [facility] providers in the past year (date may be approximate).     Assessment:   This is a routine wellness examination for Laden.  Hearing/Vision screen No results found.  Dietary issues and exercise activities discussed: Current Exercise Habits: Home exercise routine, Type of exercise: walking, Time (Minutes): > 60 (70 min), Frequency (Times/Week): 5, Weekly Exercise (Minutes/Week): 0, Intensity: Mild, Exercise limited by: None identified   Goals Addressed   None    Depression Screen    01/02/2023    1:42 PM 12/15/2022    9:12 AM 12/07/2021   10:10 AM 08/23/2021    2:59 PM 11/24/2020   10:00 AM 11/18/2019    9:41 AM 11/15/2019    1:15 PM  PHQ 2/9 Scores  PHQ - 2 Score 0 0 0 0 0 0 0  PHQ- 9 Score  0    0     Fall Risk    12/26/2022    9:35 AM 12/15/2022    9:12 AM 12/30/2021   11:06 AM 12/07/2021   10:10 AM 11/24/2020  9:59 AM  Fall Risk   Falls in the past year? 0 0 1 0 0  Number falls in past yr: 0 0 0 0 0  Injury with Fall? 0 0 1 0 0  Risk for fall due to : No Fall Risks  History of fall(s) No Fall Risks   Follow up Falls evaluation  completed Falls evaluation completed Falls evaluation completed Falls evaluation completed     FALL RISK PREVENTION PERTAINING TO THE HOME:  Any stairs in or around the home? Yes  If so, are there any without handrails? No  Home free of loose throw rugs in walkways, pet beds, electrical cords, etc? Yes  Adequate lighting in your home to reduce risk of falls? Yes   ASSISTIVE DEVICES UTILIZED TO PREVENT FALLS:  Life alert? No  Use of a cane, walker or w/c? No  Grab bars in the bathroom? No  Shower chair or bench in shower? No  Elevated toilet seat or a handicapped toilet? Yes   TIMED UP AND GO:  Was the test performed?  No, audio visit .    Cognitive Function:    01/10/2017    3:39 PM  MMSE - Mini Mental State Exam  Orientation to time 5  Orientation to Place 5  Registration 3  Attention/ Calculation 5  Recall 2  Language- name 2 objects 2  Language- repeat 1  Language- follow 3 step command 3  Language- read & follow direction 1  Write a sentence 1  Copy design 1  Total score 29        01/02/2023    1:45 PM 12/30/2021   11:12 AM  6CIT Screen  What Year? 0 points 0 points  What month? 0 points 0 points  What time? 0 points 0 points  Count back from 20 0 points 0 points  Months in reverse 4 points 0 points  Repeat phrase 2 points 6 points  Total Score 6 points 6 points    Immunizations Immunization History  Administered Date(s) Administered   Fluad Quad(high Dose 65+) 05/06/2022   Influenza, High Dose Seasonal PF 06/18/2017, 06/11/2018, 04/29/2019, 04/28/2020, 05/10/2021   Influenza-Unspecified 06/11/2016   PFIZER(Purple Top)SARS-COV-2 Vaccination 09/24/2019, 10/10/2019, 05/30/2020, 04/22/2021   Pfizer Covid-19 Vaccine Bivalent Booster 19yrs & up 05/29/2021   Pneumococcal Conjugate-13 07/20/2015   Pneumococcal Polysaccharide-23 09/05/2016   Tdap 11/14/2014, 05/29/2019   Zoster Recombinat (Shingrix) 12/26/2016, 03/21/2017    TDAP status: Up to date  Flu  Vaccine status: Up to date  Pneumococcal vaccine status: Up to date  Covid-19 vaccine status: Information provided on how to obtain vaccines.   Qualifies for Shingles Vaccine? Yes   Zostavax completed No   Shingrix Completed?: Yes  Screening Tests Health Maintenance  Topic Date Due   Medicare Annual Wellness (AWV)  12/31/2022   COVID-19 Vaccine (5 - 2023-24 season) 05/05/2023 (Originally 05/06/2022)   INFLUENZA VACCINE  04/06/2023   DTaP/Tdap/Td (3 - Td or Tdap) 05/28/2029   Pneumonia Vaccine 74+ Years old  Completed   Zoster Vaccines- Shingrix  Completed   HPV VACCINES  Aged Out    Health Maintenance  Health Maintenance Due  Topic Date Due   Medicare Annual Wellness (AWV)  12/31/2022    Colorectal cancer screening: No longer required.   Lung Cancer Screening: (Low Dose CT Chest recommended if Age 58-80 years, 30 pack-year currently smoking OR have quit w/in 15years.) does not qualify.   Additional Screening:  Hepatitis C Screening: does not qualify  Vision  Screening: Recommended annual ophthalmology exams for early detection of glaucoma and other disorders of the eye. Is the patient up to date with their annual eye exam?  Yes  Who is the provider or what is the name of the office in which the patient attends annual eye exams? [facility]. If pt is not established with a provider, would they like to be referred to a provider to establish care? No .   Dental Screening: Recommended annual dental exams for proper oral hygiene  Community Resource Referral / Chronic Care Management: CRR required this visit?  No   CCM required this visit?  No      Plan:     I have personally reviewed and noted the following in the patient's chart:   Medical and social history Use of alcohol, tobacco or illicit drugs  Current medications and supplements including opioid prescriptions. Patient is not currently taking opioid prescriptions. Functional ability and status Nutritional  status Physical activity Advanced directives List of other physicians Hospitalizations, surgeries, and ER visits in previous 12 months Vitals Screenings to include cognitive, depression, and falls Referrals and appointments  In addition, I have reviewed and discussed with patient certain preventive protocols, quality metrics, and best practice recommendations. A written personalized care plan for preventive services as well as general preventive health recommendations were provided to patient.   Due to this being a telephonic visit, the after visit summary with patients personalized plan was offered to patient via mail or my-chart. Patient would like to access on my-chart.  Donne Anon, [place]   01/02/2023   Nurse Notes: None

## 2023-01-09 NOTE — Progress Notes (Signed)
Remote pacemaker transmission.   

## 2023-01-10 DIAGNOSIS — S0502XA Injury of conjunctiva and corneal abrasion without foreign body, left eye, initial encounter: Secondary | ICD-10-CM | POA: Diagnosis not present

## 2023-01-12 DIAGNOSIS — S0502XD Injury of conjunctiva and corneal abrasion without foreign body, left eye, subsequent encounter: Secondary | ICD-10-CM | POA: Diagnosis not present

## 2023-01-25 DIAGNOSIS — L84 Corns and callosities: Secondary | ICD-10-CM | POA: Diagnosis not present

## 2023-01-25 DIAGNOSIS — B351 Tinea unguium: Secondary | ICD-10-CM | POA: Diagnosis not present

## 2023-01-25 DIAGNOSIS — M79672 Pain in left foot: Secondary | ICD-10-CM | POA: Diagnosis not present

## 2023-01-25 DIAGNOSIS — M79671 Pain in right foot: Secondary | ICD-10-CM | POA: Diagnosis not present

## 2023-02-08 ENCOUNTER — Other Ambulatory Visit: Payer: Self-pay | Admitting: Family Medicine

## 2023-02-09 ENCOUNTER — Other Ambulatory Visit: Payer: Self-pay | Admitting: Thoracic Surgery (Cardiothoracic Vascular Surgery)

## 2023-02-09 DIAGNOSIS — I7121 Aneurysm of the ascending aorta, without rupture: Secondary | ICD-10-CM

## 2023-02-09 NOTE — Progress Notes (Signed)
Cta chest

## 2023-02-27 ENCOUNTER — Other Ambulatory Visit: Payer: Self-pay | Admitting: Family Medicine

## 2023-02-28 ENCOUNTER — Telehealth: Payer: Self-pay | Admitting: Interventional Cardiology

## 2023-02-28 NOTE — Telephone Encounter (Signed)
Harriet Pho, RN Caller: Unspecified (Today,  9:35 AM) I would encourage hydration with being outside in this weather, and give reassurance with most recent device interrogation being normal. Would check BMET just to make sure he isn't acutely dehydrated, otherwise would recommend PCP follow up.  Called and provided above information to patient. He states he feels he should be seen sooner, doesn't feel this is stemming from dehydration. Had yearly appt scheduled with JV in September, moved to DOD day on 7/2 at 10:30. Gave ED precautions.

## 2023-02-28 NOTE — Telephone Encounter (Signed)
Returned call to patient who states that he's getting up early in the mornings and walking the green way (2.5 mile roundtrip). The AM readings he's provided are at the same time he's taking his medications, so makes sense that readings are initially high, then repeat ones an hour later are in range. Patient states that for 2-3 weeks, he's been feeling heaviness in his legs "almost like I felt when I had to have a pacemaker put in." Says the heavy feeling used to subside with rest, but now it's remaining "a good while." C/O feeling muscle fatigue across shoulders in back from edge to edge. He states he did do trimming bushes yesterday, but feels like this shouldn't be brought on by the amount that he did. He also states that when walking from truck to store door (grocery store) he gets very dizzy and lightheaded-hasn't actually fallen, but felt he was very close to it-says it's much worse on hot days.  Asked about fluid intake-he drinks milk in the morning and totals 3 bottles (16oz) daily. He does take this outside with him if walking or working. States when he has these dizzy spells he feels mildly nauseous and can't attest to sweating because he attributes that to working. When he sits at home in his chair, none of these episodes seem to occur.  Routing to [employer] for input.

## 2023-02-28 NOTE — Telephone Encounter (Signed)
Pt c/o BP issue: STAT if pt c/o blurred vision, one-sided weakness or slurred speech  1. What are your last 5 BP readings?   Today (a few minutes ago)  BP 120/58  HR 65    8:00 am  163/80  HR 73,    9:00 am  124/62, HR 59,  Sun 7:00 am  179/74  HR 63     9:00 am  138/68/  HR 65 Sat 8:30 am  130/66  HR 56 (after walking the greenway)  2. Are you having any other symptoms (ex. Dizziness, headache, blurred vision, passed out)?   Dizzy, legs and feet "feel heavy"  3. What is your BP issue?   Patient stated he has been outside and has been feeling dizzy.  Patient stated this has been happening every other day.

## 2023-03-01 ENCOUNTER — Ambulatory Visit: Payer: PPO

## 2023-03-01 ENCOUNTER — Other Ambulatory Visit: Payer: Self-pay | Admitting: Family Medicine

## 2023-03-01 DIAGNOSIS — I495 Sick sinus syndrome: Secondary | ICD-10-CM

## 2023-03-01 LAB — CUP PACEART REMOTE DEVICE CHECK
Battery Remaining Longevity: 36 mo
Battery Remaining Percentage: 26 %
Battery Voltage: 2.9 V
Brady Statistic RV Percent Paced: 22 %
Date Time Interrogation Session: 20240626035231
Implantable Lead Connection Status: 753985
Implantable Lead Implant Date: 20130528
Implantable Lead Location: 753860
Implantable Lead Model: 1948
Implantable Pulse Generator Implant Date: 20130528
Lead Channel Impedance Value: 650 Ohm
Lead Channel Pacing Threshold Amplitude: 0.75 V
Lead Channel Pacing Threshold Pulse Width: 0.4 ms
Lead Channel Sensing Intrinsic Amplitude: 11.9 mV
Lead Channel Setting Pacing Amplitude: 2.5 V
Lead Channel Setting Pacing Pulse Width: 0.4 ms
Lead Channel Setting Sensing Sensitivity: 2 mV
Pulse Gen Model: 1210
Pulse Gen Serial Number: 7328888

## 2023-03-06 DIAGNOSIS — D3617 Benign neoplasm of peripheral nerves and autonomic nervous system of trunk, unspecified: Secondary | ICD-10-CM | POA: Diagnosis not present

## 2023-03-06 DIAGNOSIS — L570 Actinic keratosis: Secondary | ICD-10-CM | POA: Diagnosis not present

## 2023-03-06 DIAGNOSIS — I781 Nevus, non-neoplastic: Secondary | ICD-10-CM | POA: Diagnosis not present

## 2023-03-06 DIAGNOSIS — D485 Neoplasm of uncertain behavior of skin: Secondary | ICD-10-CM | POA: Diagnosis not present

## 2023-03-07 ENCOUNTER — Ambulatory Visit: Payer: PPO | Attending: Interventional Cardiology | Admitting: Interventional Cardiology

## 2023-03-07 ENCOUNTER — Encounter: Payer: Self-pay | Admitting: Interventional Cardiology

## 2023-03-07 VITALS — BP 150/70 | HR 62 | Ht 72.0 in | Wt 203.0 lb

## 2023-03-07 DIAGNOSIS — E782 Mixed hyperlipidemia: Secondary | ICD-10-CM | POA: Diagnosis not present

## 2023-03-07 DIAGNOSIS — I495 Sick sinus syndrome: Secondary | ICD-10-CM

## 2023-03-07 DIAGNOSIS — I779 Disorder of arteries and arterioles, unspecified: Secondary | ICD-10-CM

## 2023-03-07 DIAGNOSIS — R0609 Other forms of dyspnea: Secondary | ICD-10-CM

## 2023-03-07 DIAGNOSIS — I10 Essential (primary) hypertension: Secondary | ICD-10-CM | POA: Diagnosis not present

## 2023-03-07 DIAGNOSIS — I4821 Permanent atrial fibrillation: Secondary | ICD-10-CM

## 2023-03-07 DIAGNOSIS — Z7901 Long term (current) use of anticoagulants: Secondary | ICD-10-CM

## 2023-03-07 LAB — BASIC METABOLIC PANEL
BUN: 21 mg/dL (ref 8–27)
Creatinine, Ser: 1.24 mg/dL (ref 0.76–1.27)

## 2023-03-07 NOTE — Progress Notes (Signed)
Cardiology Office Note   Date:  03/07/2023   ID:  URI Brian Davidson, DOB Mar 09, 1936, MRN [id number]  PCP:  Brian Canary, MD    No chief complaint on file.  AFib/pacemaker  Wt Readings from Last 3 Encounters:  03/07/23 203 lb (92.1 kg)  12/15/22 201 lb 3.2 oz (91.3 kg)  08/09/22 205 lb 6.4 oz (93.2 kg)       History of Present Illness: Brian Davidson is a 87 y.o. male  With a pacemaker, followed by Dr. Johney Davidson.  He saw Dr. Shelva Davidson 20 years ago for AFib.  He had cardioversion many years ago and was treated with Coumadin.  THis was later switched to Xarelto.   He is referred to me by EP PA, Brian Davidson for :"I wanted to establish him with gen cards for management of his non EP issues (HTN, carotid artery disease, moderate ascending aorta dilation, and moderate mitral stenosis) since he has been stable from a PPM/AFib standpoint and only requires annual follow up at this time    Echo in 05/2019: "Left ventricular ejection fraction, by visual estimation, is 60 to  65%. The left ventricle has normal function. Normal left ventricular size.  There is no left ventricular hypertrophy.   2. Left ventricular diastolic function could not be evaluated pattern of  LV diastolic filling.   3. Global right ventricle has normal systolic function.The right  ventricular size is normal. No increase in right ventricular wall  thickness.   4. Left atrial size was moderately dilated.   5. Right atrial size was normal.   6. The mitral valve is normal in structure. No evidence of mitral valve  regurgitation. Moderate mitral stenosis.   7. The tricuspid valve is normal in structure. Tricuspid valve  regurgitation is mild.   8. The aortic valve is normal in structure. Aortic valve regurgitation is  mild by color flow Doppler. Mild aortic valve sclerosis without stenosis.   9. The pulmonic valve was normal in structure. Pulmonic valve  regurgitation is not visualized by color flow Doppler.  10.  There is moderate dilatation of the ascending aorta measuring 43 mm.  11. Normal pulmonary artery systolic pressure.  12. The inferior vena cava is normal in size with greater than 50%  respiratory variability, suggesting right atrial pressure of 3 mmHg. "   In 2021, he reported :" that when he stands from a sitting position, he gets dizzy.  He holds a wall and is ok after a few seconds. Walks 2.5 miles daily.  Exercise tolerance better after pacer. "   Had knee replacement on 4/19.   He had both COVID shots in 2021.    Aortic aneurysm 4.4 cm at last check.   Walks 2.5 miles,  4 times a week   Had a fall on 01/05/22. PM interrogation was negative.   Denies : Chest pain. Leg edema. Nitroglycerin use. Orthopnea. Palpitations. Paroxysmal nocturnal dyspnea.  Syncope.    Does report some dizziness and dyspnea while walking.  He will have to stop for a minute and then start walking again.  He has not fallen.  He has not passed out.  Home blood pressures have been somewhat variable.  He takes his Cardizem and losartan in the evening.  He takes metoprolol in the morning.  On occasion, he has had high blood pressures first thing in the morning before taking his morning meds.  At other times, they have been normal.  He wants to avoid  additional blood pressure medicines if possible.    Past Medical History:  Diagnosis Date   Allergy    Arthritis    Biliary dyskinesia    Carotid artery disease (HCC) 07/01/2016   Dyslipidemia 07/13/2017   Dysrhythmia    a fib   GERD (gastroesophageal reflux disease)    occasional   H/O measles    History of chicken pox    History of kidney stones    Hypertension    Neuromuscular disorder (HCC)    neuropathy feet    Persistent atrial fibrillation (HCC)    Pneumonia 1992   Presence of permanent cardiac pacemaker    Prostate cancer (HCC)    Tachycardia-bradycardia (HCC)    a. s/p STJ dual chamber pacemaker   Thiamine deficiency 01/19/2018   Valvular  heart disease 07/01/2016    Past Surgical History:  Procedure Laterality Date   CHOLECYSTECTOMY N/A 11/24/2015   Procedure: LAPAROSCOPIC CHOLECYSTECTOMY;  Surgeon: Gaynelle Adu, MD;  Location: [facility];  Service: General;  Laterality: N/A;   CYSTOSCOPY  2016   EYE SURGERY     bil cataract   FRACTURE SURGERY  2005   left ankle   HERNIA REPAIR     left groin, no mesh   JOINT REPLACEMENT     PERMANENT PACEMAKER INSERTION N/A 01/31/2012   SJM Accent SR RF implanted by DR Allred   TOTAL KNEE ARTHROPLASTY Right 12/23/2019   Procedure: TOTAL KNEE ARTHROPLASTY;  Surgeon: Ollen Gross, MD;  Location: [facility];  Service: Orthopedics;  Laterality: Right;      Current Outpatient Medications  Medication Sig Dispense Refill   Calcium Carbonate-Vitamin D (CALTRATE 600+D PO) Take 600 mg by mouth daily.      Cholecalciferol 50 MCG (2000 UT) TABS Take 2,000 Units by mouth daily.      diclofenac Sodium (VOLTAREN) 1 % GEL Apply 2 g topically 4 (four) times daily. (Patient taking differently: Apply 2 g topically as needed (pain).) 350 g 2   diltiazem (CARDIZEM CD) 240 MG 24 hr capsule Take 1 capsule (240 mg total) by mouth daily. 90 capsule 1   fluticasone (FLONASE) 50 MCG/ACT nasal spray Use 2 spray(s) in each nostril once daily (Patient taking differently: 2 sprays daily as needed for allergies.) 16 g 5   gabapentin (NEURONTIN) 300 MG capsule Take 1 capsule by mouth twice daily 180 capsule 1   losartan (COZAAR) 25 MG tablet Take 1 tablet by mouth once daily 90 tablet 1   metoprolol tartrate (LOPRESSOR) 100 MG tablet Take 1 tablet by mouth twice daily 180 tablet 0   Multiple Vitamins-Minerals (MULTIVITAMIN WITH MINERALS) tablet Take 1 tablet by mouth daily. 30 tablet 3   omeprazole (PRILOSEC) 40 MG capsule as needed (heartburn).     rivaroxaban (XARELTO) 20 MG TABS tablet Take 1 tablet by mouth once daily 90 tablet 1   rosuvastatin (CRESTOR) 10 MG tablet Take 1 tablet (10 mg total) by mouth daily.  90 tablet 3   Turmeric 500 MG TABS Take 500 mg by mouth daily.      No current facility-administered medications for this visit.    Allergies:   Cardizem [diltiazem] and Sulfa antibiotics    Social History:  The patient  reports that he quit smoking about 37 years ago. His smoking use included pipe and cigars. He has never used smokeless tobacco. He reports current alcohol use of about 3.0 standard drinks of alcohol per week. He reports that he does not use drugs.  Family History:  The patient's family history includes Arthritis in his mother; Drug abuse in his mother; Heart disease in his brother, father, and sister; Heart disease (age of onset: 63) in his sister; Hypertension in his brother, daughter, mother, and son; Neuropathy in his brother and mother; Prostate cancer in his father; Stroke in his father.    ROS:  Please see the history of present illness.   Otherwise, review of systems are positive for dizziness.   All other systems are reviewed and negative.    PHYSICAL EXAM: VS:  BP (!) 150/70   Pulse 62   Ht 6' (1.829 m)   Wt 203 lb (92.1 kg)   SpO2 98%   BMI 27.53 kg/m  , BMI Body mass index is 27.53 kg/m. GEN: Well nourished, well developed, in no acute distress HEENT: normal Neck: no JVD, carotid bruits, or masses Cardiac: irregularly irregular; no murmurs, rubs, or gallops,no edema  Respiratory:  clear to auscultation bilaterally, normal work of breathing GI: soft, nontender, nondistended, + BS MS: no deformity or atrophy; varicose veins in LE Skin: warm and dry, no rash Neuro:  Strength and sensation are intact Psych: euthymic mood, full affect   EKG:   The ekg ordered today demonstrates Paced rhythm, underlying AFib   Recent Labs: 04/09/2022: Magnesium 1.9 12/15/2022: ALT 21; BUN 21; Creatinine, Ser 1.22; Hemoglobin 14.3; Platelets 132.0; Potassium 4.1; Sodium 142; TSH 1.78   Lipid Panel    Component Value Date/Time   CHOL 106 12/15/2022 1000   TRIG 55.0  12/15/2022 1000   HDL 51.70 12/15/2022 1000   CHOLHDL 2 12/15/2022 1000   VLDL 11.0 12/15/2022 1000   LDLCALC 43 12/15/2022 1000   LDLCALC 107 (H) 05/26/2020 1103     Other studies Reviewed: Additional studies/ records that were reviewed today with results demonstrating: labs reviewed- K 3.8.    ASSESSMENT AND PLAN:  Dilated aortic root: Has had follow-up CT scan and follow-up with the [facility].  Per the note in 2023, he is getting close to the point where routine screening will not be done. Pacemaker: Followed by EP. Hyperlipidemia:  LDL 43 in 4/24. Continue rosuvastatin.  Atrial fibrillation: Rate controlled. Mitral stenosis: moderate in 2020.  No CHF sx.  Plan for repeat echo given dizziness.  Check bmet and BNP as well. Prediabetes: Whole food, plant-based diet.  High-fiber diet.  Avoid processed foods. Anticoagulated: Xarelto for stroke prevention.  We talked about the importance of avoiding falls.  No bleeding issues.  Hypertension: Home blood pressure readings elevated.  The current medical regimen is effective;  continue present plan and medications.  Continue to Check blood pressures at home after resting for 5 to 10 minutes.   Current medicines are reviewed at length with the patient today.  The patient concerns regarding his medicines were addressed.  The following changes have been made:  No change  Labs/ tests ordered today include: BMet BNP, echo to evaluate mitral stenosis  Orders Placed This Encounter  Procedures   EKG 12-Lead    Recommend 150 minutes/week of aerobic exercise Low fat, low carb, high fiber diet recommended  Disposition:   FU in 1 year   Signed, Lance Muss, MD  03/07/2023 10:45 AM    [facility] [address] [place], [place], [place]  [address] Phone: [phone]; Fax: [phone]

## 2023-03-07 NOTE — Patient Instructions (Addendum)
Medication Instructions:  Your physician recommends that you continue on your current medications as directed. Please refer to the Current Medication list given to you today.  *If you need a refill on your cardiac medications before your next appointment, please call your pharmacy*   Lab Work: Lab work to be done today--BMP and BNP If you have labs (blood work) drawn today and your tests are completely normal, you will receive your results only by: MyChart Message (if you have MyChart) OR A paper copy in the mail If you have any lab test that is abnormal or we need to change your treatment, we will call you to review the results.   Testing/Procedures: Your physician has requested that you have an echocardiogram. Echocardiography is a painless test that uses sound waves to create images of your heart. It provides your doctor with information about the size and shape of your heart and how well your heart's chambers and valves are working. This procedure takes approximately one hour. There are no restrictions for this procedure. Please do NOT wear cologne, perfume, aftershave, or lotions (deodorant is allowed). Please arrive 15 minutes prior to your appointment time.    Follow-Up: At [facility], you and your health needs are our priority.  As part of our continuing mission to provide you with exceptional heart care, we have created designated Provider Care Teams.  These Care Teams include your primary Cardiologist (physician) and Advanced Practice Providers (APPs -  Physician Assistants and Nurse Practitioners) who all work together to provide you with the care you need, when you need it.  We recommend signing up for the patient portal called "MyChart".  Sign up information is provided on this After Visit Summary.  MyChart is used to connect with patients for Virtual Visits (Telemedicine).  Patients are able to view lab/test results, encounter notes, upcoming appointments, etc.   Non-urgent messages can be sent to your provider as well.   To learn more about what you can do with MyChart, go to [email or web address].    Your next appointment:   12 month(s)  Provider:   Lance Muss, MD     Other Instructions

## 2023-03-08 ENCOUNTER — Telehealth: Payer: Self-pay | Admitting: *Deleted

## 2023-03-08 DIAGNOSIS — R0609 Other forms of dyspnea: Secondary | ICD-10-CM

## 2023-03-08 DIAGNOSIS — I4821 Permanent atrial fibrillation: Secondary | ICD-10-CM

## 2023-03-08 LAB — BASIC METABOLIC PANEL
BUN/Creatinine Ratio: 17 (ref 10–24)
CO2: 23 mmol/L (ref 20–29)
Calcium: 9.5 mg/dL (ref 8.6–10.2)
Chloride: 104 mmol/L (ref 96–106)
Glucose: 82 mg/dL (ref 70–99)
Potassium: 4.3 mmol/L (ref 3.5–5.2)
Sodium: 140 mmol/L (ref 134–144)
eGFR: 57 mL/min/{1.73_m2} — ABNORMAL LOW (ref >59)

## 2023-03-08 LAB — PRO B NATRIURETIC PEPTIDE: NT-Pro BNP: 1479 pg/mL — ABNORMAL HIGH (ref 0–486)

## 2023-03-08 MED ORDER — FUROSEMIDE 40 MG PO TABS: 40.0000 mg | ORAL_TABLET | Freq: Every day | ORAL | 6 refills | Status: DC

## 2023-03-08 NOTE — Addendum Note (Signed)
Addended by: Dossie Arbour on: 03/08/2023 05:06 PM   Modules accepted: Orders

## 2023-03-08 NOTE — Telephone Encounter (Signed)
Patient called back and would like to start furosemide.  He thinks he will start on 7/5.  He will come in for BMP on 7/12.  Prescription sent to [facility] on [address]

## 2023-03-08 NOTE — Telephone Encounter (Signed)
-----   Message from Corky Crafts, MD sent at 03/08/2023  2:13 PM EDT ----- Start Lasix 40 mg daily.  BMet in 1 week.

## 2023-03-08 NOTE — Telephone Encounter (Signed)
OK.  Elevated BNP; will see if his symptoms worsen or of EF has dropped.

## 2023-03-08 NOTE — Telephone Encounter (Signed)
I spoke with patient and reviewed lab results/recommendations with him.  He reports he only has shortness of breath at times when walking and can usually walk 2.5 miles.  He reports he has no swelling, has lost 7-8 lbs in the last 6 months and urinates frequently during the day. He states he is not going to take lasix.  Patient will have echo as planned.

## 2023-03-17 ENCOUNTER — Ambulatory Visit: Payer: PPO | Attending: Interventional Cardiology

## 2023-03-17 DIAGNOSIS — R0609 Other forms of dyspnea: Secondary | ICD-10-CM | POA: Diagnosis not present

## 2023-03-17 DIAGNOSIS — I4821 Permanent atrial fibrillation: Secondary | ICD-10-CM

## 2023-03-18 LAB — BASIC METABOLIC PANEL
BUN/Creatinine Ratio: 20 (ref 10–24)
BUN: 28 mg/dL — ABNORMAL HIGH (ref 8–27)
CO2: 23 mmol/L (ref 20–29)
Calcium: 9.4 mg/dL (ref 8.6–10.2)
Chloride: 103 mmol/L (ref 96–106)
Creatinine, Ser: 1.39 mg/dL — ABNORMAL HIGH (ref 0.76–1.27)
Glucose: 99 mg/dL (ref 70–99)
Potassium: 4 mmol/L (ref 3.5–5.2)
Sodium: 140 mmol/L (ref 134–144)
eGFR: 50 mL/min/{1.73_m2} — ABNORMAL LOW (ref >59)

## 2023-03-20 ENCOUNTER — Other Ambulatory Visit: Payer: Self-pay | Admitting: Family Medicine

## 2023-03-20 ENCOUNTER — Other Ambulatory Visit: Payer: Self-pay | Admitting: Interventional Cardiology

## 2023-03-21 ENCOUNTER — Telehealth: Payer: Self-pay | Admitting: Interventional Cardiology

## 2023-03-21 NOTE — Telephone Encounter (Signed)
Pt called in concerned about his light headedness and BMET result. States yesterday morning he took his normal 2.5 mile walk, came home and took BP about 20 min after returning:  BP 121/58, HR 68.  This morning 118/50, HR 56. Does not report light headedness as any different than we he discussed it w/ Dr. Eldridge Dace earlier this month.  Has not changed/worsened, just continues to occur and he wants to know why. Informed that reported BP &  HRs are normal and would not explain symptom. Informed that his abn Creatinine is not currently concerning as it is only slightly elevated and could be related to a number of things, including the Lasix. Aware that I am not sure if there is anything more we can do at this time.  BP/HRs are WNL, nothing  mentioned in MD OV note that light headedness is heart related and he most likely needs to complete recommended upcoming testing (CT & MRI), before further determination can be made. Aware forwarding to MD for his review/advisement.  Pt aware office will let him know what MD says.

## 2023-03-21 NOTE — Telephone Encounter (Signed)
Pt states states he is still having dizzy spells and does think the Furosemide is working . Please advise furosemide (LASIX) 40 MG tablet

## 2023-03-22 NOTE — Progress Notes (Signed)
 Remote pacemaker transmission.   

## 2023-03-24 NOTE — Telephone Encounter (Signed)
Addressed in lab section.  Awaiting echo

## 2023-03-24 NOTE — Progress Notes (Signed)
[address]       [place] [address]             [phone]    PCP is Bradd Canary, MD Referring Provider is Bradd Canary, MD  Chief Complaint: Ascending thoracic aortic aneurysm   HPI: This is a 87 year old male with a past medical history of hypertension, dyslipidemia, persistent atrial fibrillation, neuropathy of feet, carotid artery disease, biliary dyskinesia, remote tobacco abuse who was found to have a 4.3 cm ascending thoracic aortic aneurysm. He was last seen in July 2023, was asymptomatic, and aneurysm measured 4.3 cm. He presents today for further surveillance of his ascending thoracic aortic aneurysm. He denies chest pain, pressure, or tightness, or LE edema. He has had episodes of lightheadedness (mostly with high outside temperature) but no syncope. Dr. Eldridge Dace has temporarily stopped Losartan. Patient has been keeping a record of his BP (SBP 140-150's). He is still fairly active;he walks 2 1/2-3 miles every other day.  Past Medical History:  Diagnosis Date   Allergy    Arthritis    Biliary dyskinesia    Carotid artery disease (HCC) 07/01/2016   Dyslipidemia 07/13/2017   Dysrhythmia    a fib   GERD (gastroesophageal reflux disease)    occasional   H/O measles    History of chicken pox    History of kidney stones    Hypertension    Neuromuscular disorder (HCC)    neuropathy feet    Persistent atrial fibrillation (HCC)    Pneumonia 1992   Presence of permanent cardiac pacemaker    Prostate cancer (HCC)    Tachycardia-bradycardia (HCC)    a. s/p STJ dual chamber pacemaker   Thiamine deficiency 01/19/2018   Valvular heart disease 07/01/2016    Past Surgical History:  Procedure Laterality Date   CHOLECYSTECTOMY N/A 11/24/2015   Procedure: LAPAROSCOPIC CHOLECYSTECTOMY;  Surgeon: Gaynelle Adu, MD;  Location: [facility];  Service: General;  Laterality: N/A;   CYSTOSCOPY  2016   EYE SURGERY     bil cataract   FRACTURE SURGERY  2005    left ankle   HERNIA REPAIR     left groin, no mesh   JOINT REPLACEMENT     PERMANENT PACEMAKER INSERTION N/A 01/31/2012   SJM Accent SR RF implanted by DR Allred   TOTAL KNEE ARTHROPLASTY Right 12/23/2019   Procedure: TOTAL KNEE ARTHROPLASTY;  Surgeon: Ollen Gross, MD;  Location: [facility];  Service: Orthopedics;  Laterality: Right;     Family History  Problem Relation Age of Onset   Neuropathy Mother    Hypertension Mother    Arthritis Mother    Drug abuse Mother    Stroke Father    Heart disease Father    Prostate cancer Father        not treated   Heart disease Sister 11   Heart disease Sister    Heart disease Brother    Neuropathy Brother    Hypertension Brother    Hypertension Daughter    Hypertension Son    Colon cancer Neg Hx   Both mother and father had AAA   Social History Social History   Tobacco Use   Smoking status: Former    Types: Pipe, Cigars    Quit date: 11/18/1985    Years since quitting: 37.3   Smokeless tobacco: Never  Vaping Use   Vaping status: Never Used  Substance Use Topics   Alcohol use: Yes  Alcohol/week: 3.0 standard drinks of alcohol    Types: 3 Cans of beer per week    Comment: occ   Drug use: No    Current Outpatient Medications  Medication Sig Dispense Refill   Calcium Carbonate-Vitamin D (CALTRATE 600+D PO) Take 600 mg by mouth daily.      Cholecalciferol 50 MCG (2000 UT) TABS Take 2,000 Units by mouth daily.      diclofenac Sodium (VOLTAREN) 1 % GEL Apply 2 g topically 4 (four) times daily. (Patient taking differently: Apply 2 g topically as needed (pain).) 350 g 2   diltiazem (CARDIZEM CD) 240 MG 24 hr capsule TAKE ONE (1) CAPSULE EACH DAY BY MOUTH 90 capsule 1   fluticasone (FLONASE) 50 MCG/ACT nasal spray Use 2 spray(s) in each nostril once daily (Patient taking differently: 2 sprays daily as needed for allergies.) 16 g 5   furosemide (LASIX) 40 MG tablet Take 1 tablet (40 mg total) by mouth daily. 30 tablet 6    gabapentin (NEURONTIN) 300 MG capsule Take 1 capsule by mouth twice daily 180 capsule 1   losartan (COZAAR) 25 MG tablet Take 1 tablet by mouth once daily 90 tablet 1   metoprolol tartrate (LOPRESSOR) 100 MG tablet Take 1 tablet by mouth twice daily 180 tablet 0   Multiple Vitamins-Minerals (MULTIVITAMIN WITH MINERALS) tablet Take 1 tablet by mouth daily. 30 tablet 3   omeprazole (PRILOSEC) 40 MG capsule as needed (heartburn).     rivaroxaban (XARELTO) 20 MG TABS tablet Take 1 tablet by mouth once daily 90 tablet 1   rosuvastatin (CRESTOR) 10 MG tablet Take 1 tablet by mouth once daily 90 tablet 3   Turmeric 500 MG TABS Take 500 mg by mouth daily.      Allergies  Allergen Reactions   Cardizem [Diltiazem]     Rash from Yellow dye in generic capsule.   Can take different brand. Takes diltiazem - his allergy is to Cardizem   Sulfa Antibiotics     Unknown childhood reaction    Review of Systems: Chest Pain [ N ] Resting SOB [ N] Exertional SOB [ N ]  Pedal Edema Klaus.Mock  ] Syncope [ N ]  General Review of Systems: [Y] = yes [ N]=no  Consitutional:   nausea [ N];  fever Klaus.Mock ];  Eye :  Amaurosis fugax[N  ];  Resp: cough [ N];  hemoptysis[N ];  GI: vomiting[ N]; melena[ N]; hematochezia [N];  ZO:[id number][ N]; Heme/Lymph: anemia[ N];  Neuro: TIA[N ];stroke[N ];  seizures[N ];  Endocrine: diabetes[ N];   Vital Signs: Vitals:   04/03/23 1231  BP: (!) 160/85  Pulse: 67  Resp: 18  SpO2: 98%     Physical Exam: CV-RRR, no murmur Pulmonary-Clear to auscultation bilaterally Abdomen-Soft, non tender, bowel sounds present Extremities-Trace LE edema. Multiple varicosities both LE Neurologic-Grossly intact without focal deficit   Diagnostic Tests:  Narrative & Impression  CLINICAL DATA:  Follow-up ascending thoracic aortic aneurysm   EXAM: CT ANGIOGRAPHY CHEST WITH CONTRAST   TECHNIQUE: Multidetector CT imaging of the chest was performed using the standard protocol during bolus  administration of intravenous contrast. Multiplanar CT image reconstructions and MIPs were obtained to evaluate the vascular anatomy.   RADIATION DOSE REDUCTION: This exam was performed according to the departmental dose-optimization program which includes automated exposure control, adjustment of the mA and/or kV according to patient size and/or use of iterative reconstruction technique.   CONTRAST:  75mL ISOVUE-370 IOPAMIDOL (ISOVUE-370) INJECTION 76%  COMPARISON:  Chest CT dated March 29, 2022   FINDINGS: Cardiovascular: Mild cardiomegaly. Trace pericardial effusion. Single lead left chest wall pacer with tip in the right ventricle. Stable mildly dilated aortic root measuring up to 4.3 cm and mildly dilated ascending thoracic aorta measuring up to 4.2 cm. Severe atherosclerotic disease of the thoracic aorta with areas of ulcerated soft plaque. Severe coronary artery calcifications.   Mediastinum/Nodes: Esophagus and thyroid are unremarkable. No enlarged lymph nodes seen in the chest.   Lungs/Pleura: Central airways are patent. No consolidation or pneumothorax. Trace left pleural effusion, decreased in size when compared with the prior exam.   Upper Abdomen: Prior cholecystectomy. No acute abnormality.   Musculoskeletal: No chest wall abnormality. No acute or significant osseous findings.   Review of the MIP images confirms the above findings.   IMPRESSION: 1. Stable mildly dilated aortic root measuring up to 4.3 cm and mildly dilated ascending thoracic aorta measuring up to 4.2 cm. Recommend annual imaging followup by CTA or MRA. This recommendation follows 2010 ACCF/AHA/AATS/ACR/ASA/SCA/SCAI/SIR/STS/SVM Guidelines for the Diagnosis and Management of Patients with Thoracic Aortic Disease. Circulation. 2010; 121: O973-Z329. Aortic aneurysm NOS (ICD10-I71.9) 2. Severe coronary artery calcifications and aortic Atherosclerosis (ICD10-I70.0).     Electronically Signed    By: Allegra Lai M.D.   On: 04/03/2023 13:01       Impression and Plan: CTA shows a 4.2 cm ascending aortic aneurysm.  Echocardiogram was done 03/30/2023 and showed a tricuspid aortic valve without evidence of mild to moderate aortic valve regurgitation.  We discussed the natural history and and risk factors for growth of ascending aortic aneurysms.  We covered the importance of smoking cessation, tight blood pressure control, refraining from lifting heavy objects, and avoiding fluoroquinolone antibiotics.  The patient is aware of signs and symptoms of aortic dissection and when to present to the emergency department.  CTA result was not available at the time of the office visit so will call patient with result. We will continue surveillance and a repeat CTA was ordered for 1 year.  Ardelle Balls, PA-C [facility] [phone]

## 2023-03-28 ENCOUNTER — Telehealth: Payer: Self-pay | Admitting: Interventional Cardiology

## 2023-03-28 DIAGNOSIS — Z79899 Other long term (current) drug therapy: Secondary | ICD-10-CM

## 2023-03-28 DIAGNOSIS — I10 Essential (primary) hypertension: Secondary | ICD-10-CM

## 2023-03-28 NOTE — Telephone Encounter (Signed)
Discussed lab results with patient and Dr. Hoyle Barr recommendation: Brian Crafts, MD  Try to stay well hydrated.  Can repeat BMet on the day he comes in for echo.  Patient states he is already drinking 70-80 oz of water daily and can't really drink much more.  He reports SBP in the 110's-120's, DBP 60's, HR 60's. He reports having episodes of lightheadedness nearly every day, not just with position changes but even during his walks. This is resolved when he stands still for a few minutes before moving again.  He also asked about his BNP. Reviewed BNP from 03/07/23, which was elevated and prompted the start of Lasix 40mg  daily with f/u BMET.   Patient will have repeat BMET as recommended on 03/30/23, but would like to know if he also needs to recheck BNP. He is also concerned his BP is too low for him, causing his lightheadedness.  Will forward to Dr. Eldridge Dace to review and advise.  BMET ordered and lab appt scheduled for 03/30/23.

## 2023-03-28 NOTE — Telephone Encounter (Signed)
-----   Message from [place] sent at 03/24/2023  9:40 AM EDT ----- Try to stay well hydrated.  Can repeat BMet on the day he comes in for echo.

## 2023-03-28 NOTE — Telephone Encounter (Signed)
If he is having DOE, [facility], we can check a BNP.  If he feels his BP is low, I am ok to hold the losartan 25 mg daily for a few days and see if he feels better.

## 2023-03-29 DIAGNOSIS — M79672 Pain in left foot: Secondary | ICD-10-CM | POA: Diagnosis not present

## 2023-03-29 DIAGNOSIS — L84 Corns and callosities: Secondary | ICD-10-CM | POA: Diagnosis not present

## 2023-03-29 DIAGNOSIS — M79671 Pain in right foot: Secondary | ICD-10-CM | POA: Diagnosis not present

## 2023-03-29 DIAGNOSIS — B351 Tinea unguium: Secondary | ICD-10-CM | POA: Diagnosis not present

## 2023-03-29 NOTE — Telephone Encounter (Signed)
Left the pt a message to call the office back and triage can assist with getting him Dr. Hoyle Barr recommendations on this message.

## 2023-03-30 ENCOUNTER — Ambulatory Visit (HOSPITAL_COMMUNITY): Payer: PPO | Attending: Interventional Cardiology

## 2023-03-30 ENCOUNTER — Ambulatory Visit: Payer: PPO

## 2023-03-30 DIAGNOSIS — I4821 Permanent atrial fibrillation: Secondary | ICD-10-CM

## 2023-03-30 DIAGNOSIS — I10 Essential (primary) hypertension: Secondary | ICD-10-CM

## 2023-03-30 DIAGNOSIS — R0609 Other forms of dyspnea: Secondary | ICD-10-CM

## 2023-03-30 DIAGNOSIS — Z79899 Other long term (current) drug therapy: Secondary | ICD-10-CM | POA: Insufficient documentation

## 2023-03-30 DIAGNOSIS — I495 Sick sinus syndrome: Secondary | ICD-10-CM

## 2023-03-30 LAB — ECHOCARDIOGRAM COMPLETE
Area-P 1/2: 4.33 cm2
P 1/2 time: 470 msec
S' Lateral: 2.7 cm

## 2023-03-31 LAB — BASIC METABOLIC PANEL: Chloride: 100 mmol/L (ref 96–106)

## 2023-03-31 MED ORDER — FUROSEMIDE 40 MG PO TABS: 40.0000 mg | ORAL_TABLET | Freq: Every day | ORAL | 3 refills | Status: DC

## 2023-03-31 NOTE — Telephone Encounter (Signed)
-----   Message from [place] sent at 03/31/2023  8:19 AM EDT ----- BMet similar to prior.  Cr was the same about 1 year ago.  How is the BP off of the ARB?  Has dizziness changed?

## 2023-03-31 NOTE — Telephone Encounter (Signed)
Patient returned RN's call. 

## 2023-03-31 NOTE — Telephone Encounter (Signed)
The patient has been notified of the result and verbalized understanding.  All questions (if any) were answered. Brian Schatz, RN 03/31/2023 10:42 AM    We never got in touch with the patient to advise on holding losartan. He states that he has still been having dizzy spells at times. Yesterday he states that he felt good and didn't have any spells. His blood pressure was elevated yesterday at 150/68. He states that usually it is 120s/50s. He states that he would like to try holding losartan and see how he feels. He will hold over the weekend and monitor his BP and let us know.

## 2023-04-03 ENCOUNTER — Ambulatory Visit: Payer: PPO | Admitting: Physician Assistant

## 2023-04-03 ENCOUNTER — Ambulatory Visit
Admission: RE | Admit: 2023-04-03 | Discharge: 2023-04-03 | Disposition: A | Discharge status: Home / Self Care | Payer: PPO | Source: Ambulatory Visit | Attending: Thoracic Surgery (Cardiothoracic Vascular Surgery) | Admitting: Thoracic Surgery (Cardiothoracic Vascular Surgery)

## 2023-04-03 ENCOUNTER — Encounter: Payer: Self-pay | Admitting: Physician Assistant

## 2023-04-03 VITALS — BP 160/85 | HR 67 | Resp 18 | Ht 72.0 in | Wt 192.0 lb

## 2023-04-03 DIAGNOSIS — I700 Atherosclerosis of aorta: Secondary | ICD-10-CM | POA: Diagnosis not present

## 2023-04-03 DIAGNOSIS — Z950 Presence of cardiac pacemaker: Secondary | ICD-10-CM | POA: Diagnosis not present

## 2023-04-03 DIAGNOSIS — I7121 Aneurysm of the ascending aorta, without rupture: Secondary | ICD-10-CM

## 2023-04-03 MED ORDER — FLUTICASONE PROPIONATE 50 MCG/ACT NA SUSP: 2.0000 | Freq: Every day | NASAL | Status: DC | PRN

## 2023-04-03 MED ORDER — IOPAMIDOL (ISOVUE-370) INJECTION 76%
75.0000 mL | Freq: Once | INTRAVENOUS | Status: AC | PRN
  Administered 2023-04-03: 75 mL via INTRAVENOUS

## 2023-04-03 NOTE — Patient Instructions (Signed)
Risk Modification in those with ascending thoracic aortic aneurysm:  Continue good control of blood pressure (prefer SBP 130/80 or less)-continue Diltiaxem (Cardizem),CD and Metoprolol Tartrate (Lopressor). Losartan temporarily stopped and will defer to Dr. Eldridge Dace if to restart (stopped secondary to lightheadedness).  2. Avoid fluoroquinolone antibiotics (I.e Ciprofloxacin, Avelox, Levofloxacin, Ofloxacin)  3.  Use of statin (to decrease cardiovascular risk)-continue Crestor  4.  Exercise and activity limitations is individualized, but in general, contact sports are to be avoided and one should avoid heavy lifting (defined as half of ideal body weight) and exercises involving sustained Valsalva maneuver.  5. Counseling for those suspected of having genetically mediated disease. First-degree relatives of those with TAA disease should be screened as well as those who have a connective tissue disease (I.e with Marfan syndrome, Ehlers-Danlos syndrome, and Loeys-Dietz syndrome) or a  bicuspid aortic valve,have an increased risk for complications related to TAA. Echo was done 03/30/2023 and showed a tricuspid aortic valve, mild to moderate aortic insufficiency, and mild mitral regurgitation,  mild dilatation of the ascending aorta-measuring 44 mm. He has no family history of connective tissue disease.  6.He has a remote history of tobacco abuse

## 2023-04-10 ENCOUNTER — Other Ambulatory Visit: Payer: Self-pay | Admitting: Family Medicine

## 2023-04-10 NOTE — Telephone Encounter (Signed)
I spoke with patient.  He feels better since stopping Losartan. Since stopping he reports the following readings- 119/55, 66 119/61, 61 117/57 138/66 134/66. Dizziness has improved.  Had one spell on Saturday but this is the only one he has had.   He will stay off losartan and continue to monitor BP.  He will let us know if BP starts going up

## 2023-04-13 ENCOUNTER — Encounter: Payer: Self-pay | Admitting: Physician Assistant

## 2023-04-13 ENCOUNTER — Telehealth: Payer: Self-pay | Admitting: Family Medicine

## 2023-04-13 ENCOUNTER — Telehealth (INDEPENDENT_AMBULATORY_CARE_PROVIDER_SITE_OTHER): Payer: PPO | Admitting: Physician Assistant

## 2023-04-13 DIAGNOSIS — U071 COVID-19: Secondary | ICD-10-CM | POA: Diagnosis not present

## 2023-04-13 DIAGNOSIS — R051 Acute cough: Secondary | ICD-10-CM | POA: Diagnosis not present

## 2023-04-13 MED ORDER — DEXTROMETHORPHAN POLISTIREX ER 30 MG/5ML PO SUER
30.0000 mg | Freq: Four times a day (QID) | ORAL | 0 refills | Status: DC | PRN
Start: 2023-04-13 — End: 2023-07-31

## 2023-04-13 MED ORDER — MOLNUPIRAVIR EUA 200MG CAPSULE: 4.0000 | ORAL_CAPSULE | Freq: Two times a day (BID) | ORAL | 0 refills | Status: AC

## 2023-04-13 NOTE — Telephone Encounter (Signed)
Patient called to advise that he tested positive for covid this morning and he would like something to be sent in to his pharmacy. Pt has a cough, runny nose, mild fever, congestion. Pt wants it sent to [facility] on [address]. Pt was advised he may need a virtual visit but he said his provider called it in for him 3 years ago. Pt cannot use paxlovid. Please call to advise

## 2023-04-13 NOTE — Telephone Encounter (Signed)
Pt had an appt today with Lillia Abed , spoke with pt stated he has picked up medication

## 2023-04-13 NOTE — Progress Notes (Signed)
MyChart Audio Visit    Virtual Visit via Audio Note   This format is felt to be most appropriate for this patient at this time. Physical exam was limited by quality of the video and audio technology used for the visit. We attempted a video visit, pt was able to connect, but could not unmute or turn on his video. Visit was transitioned to audio.  Patient location: home Provider location: [facility]  I discussed the limitations of evaluation and management by telemedicine and the availability of in person appointments. The patient expressed understanding and agreed to proceed.  Patient: Brian Davidson   DOB: 07/11/1936   87 y.o. Male  MRN: [id number] Visit Date: 04/13/2023  Today's healthcare provider: Alfredia Ferguson, PA-C   Cc. Covid 19  Subjective    HPI   Pt reports testing positive for COVID 19 on a home test this morning. Reports a cough, runny nose, x 1 day. Reports temp of 101 F this Am, last check after tylenol back to 55F. Reports taking antihistamine, flonase, and mucinex otc. Denies SOB, wheezing.  Medications: Outpatient Medications Prior to Visit  Medication Sig   Calcium Carbonate-Vitamin D (CALTRATE 600+D PO) Take 600 mg by mouth daily.    Cholecalciferol 50 MCG (2000 UT) TABS Take 2,000 Units by mouth daily.    diclofenac Sodium (VOLTAREN) 1 % GEL Apply 2 g topically 4 (four) times daily. (Patient taking differently: Apply 2 g topically as needed (pain).)   diltiazem (CARDIZEM CD) 240 MG 24 hr capsule TAKE ONE (1) CAPSULE EACH DAY BY MOUTH   fluticasone (FLONASE) 50 MCG/ACT nasal spray Place 2 sprays into both nostrils daily as needed for allergies or rhinitis.   furosemide (LASIX) 40 MG tablet Take 1 tablet (40 mg total) by mouth daily.   gabapentin (NEURONTIN) 300 MG capsule Take 1 capsule by mouth twice daily   losartan (COZAAR) 25 MG tablet Take 1 tablet by mouth once daily (Patient not taking: Reported on 04/03/2023)   metoprolol tartrate (LOPRESSOR) 100 MG  tablet Take 1 tablet by mouth twice daily   Multiple Vitamins-Minerals (MULTIVITAMIN WITH MINERALS) tablet Take 1 tablet by mouth daily.   omeprazole (PRILOSEC) 40 MG capsule as needed (heartburn).   rosuvastatin (CRESTOR) 10 MG tablet Take 1 tablet by mouth once daily   Turmeric 500 MG TABS Take 500 mg by mouth daily.    XARELTO 20 MG TABS tablet Take 1 tablet by mouth once daily   No facility-administered medications prior to visit.    Review of Systems  Constitutional:  Negative for fatigue and fever.  HENT:  Positive for congestion, rhinorrhea and sore throat.   Respiratory:  Positive for cough. Negative for shortness of breath.   Cardiovascular:  Negative for chest pain, palpitations and leg swelling.  Neurological:  Negative for dizziness and headaches.      Objective    There were no vitals taken for this visit.  Physical Exam Neurological:     Mental Status: He is oriented to person, place, and time.  Psychiatric:        Mood and Affect: Mood normal.        Behavior: Behavior normal.        Assessment & Plan     1. COVID-19 2. Acute cough Advised pt hydrate, rest, cont mucinex, tylenol otc.  Recommending robitussin or delsym otc.  Will attempt to get molnupiravir but advised of avail difficulty and insurance coverage. Pt is unable to take paxlovid  d/t med interactions Ultimately symptoms are mild today If any persistent fever, worsening symptoms, sob, please contact office/ED.  - dextromethorphan (DELSYM) 30 MG/5ML liquid; Take 5 mLs (30 mg total) by mouth every 6 (six) hours as needed for cough.  Dispense: 89 mL; Refill: 0  - molnupiravir EUA (LAGEVRIO) 200 mg CAPS capsule; Take 4 capsules (800 mg total) by mouth 2 (two) times daily for 5 days.  Dispense: 40 capsule; Refill: 0 Return if symptoms worsen or fail to improve.     I discussed the assessment and treatment plan with the patient. The patient was provided an opportunity to ask questions and all were  answered. The patient agreed with the plan and demonstrated an understanding of the instructions.   The patient was advised to call back or seek an in-person evaluation if the symptoms worsen or if the condition fails to improve as anticipated.  I provided 8 minutes of non-face-to-face time during this encounter.  I, Alfredia Ferguson, PA-C have reviewed all documentation for this visit. The documentation on  04/13/23   for the exam, diagnosis, procedures, and orders are all accurate and complete.   Alfredia Ferguson, PA-C [facility] [phone] (phone) [phone] (fax)  [facility]

## 2023-04-14 NOTE — Addendum Note (Signed)
Addended by: Dossie Arbour on: 04/14/2023 08:14 AM   Modules accepted: Orders

## 2023-05-09 DIAGNOSIS — Z8546 Personal history of malignant neoplasm of prostate: Secondary | ICD-10-CM | POA: Diagnosis not present

## 2023-05-12 ENCOUNTER — Telehealth: Payer: Self-pay | Admitting: Interventional Cardiology

## 2023-05-12 NOTE — Telephone Encounter (Signed)
Pt calling in again w/ continued light headedness/dizziness. States that the past couple weeks it has been worsening. Yesterday was bad, he was "really light headed and almost fell out".  When this happens he stops, takes a min/two and then it goes away. States he has been drinking plenty of fluids. BP last night was 116/55, and he did not take it during the day when he was having episodes (light headed). BP still avg about the same as he reported last month, along with these same issues. But he also thinks his BP is too low -- that he is used to 130-140s (although he reported low numbers in June, before Lasix --- 107/57, 113/50) He still feels like Lasix is causing all of this, "or something".  Says this did not start until they stopped his Losartan and started this. Aware forwarding to MD for review/advisement. Pt agreeable to plan.

## 2023-05-12 NOTE — Telephone Encounter (Signed)
Pt c/o medication issue:  1. Name of Medication:   furosemide (LASIX) 40 MG tablet    2. How are you currently taking this medication (dosage and times per day)? Take 1 tablet (40 mg total) by mouth daily.   3. Are you having a reaction (difficulty breathing--STAT)? No   4. What is your medication issue? Patient is requesting to speak with a nurse in regards to this medication.

## 2023-05-16 DIAGNOSIS — Z8546 Personal history of malignant neoplasm of prostate: Secondary | ICD-10-CM | POA: Diagnosis not present

## 2023-05-16 DIAGNOSIS — N400 Enlarged prostate without lower urinary tract symptoms: Secondary | ICD-10-CM | POA: Diagnosis not present

## 2023-05-18 NOTE — Telephone Encounter (Signed)
OK to cut Lasix to 20 mg daily and monitor BP.

## 2023-05-22 MED ORDER — FUROSEMIDE 20 MG PO TABS: 20.0000 mg | ORAL_TABLET | Freq: Every day | ORAL | 3 refills | Status: DC

## 2023-05-22 NOTE — Telephone Encounter (Signed)
Patient notified.  Prescription sent to [facility] on [address]

## 2023-05-31 ENCOUNTER — Ambulatory Visit: Payer: PPO

## 2023-05-31 ENCOUNTER — Other Ambulatory Visit: Payer: Self-pay | Admitting: Family Medicine

## 2023-05-31 DIAGNOSIS — L84 Corns and callosities: Secondary | ICD-10-CM | POA: Diagnosis not present

## 2023-05-31 DIAGNOSIS — B351 Tinea unguium: Secondary | ICD-10-CM | POA: Diagnosis not present

## 2023-05-31 DIAGNOSIS — I495 Sick sinus syndrome: Secondary | ICD-10-CM | POA: Diagnosis not present

## 2023-05-31 DIAGNOSIS — M79671 Pain in right foot: Secondary | ICD-10-CM | POA: Diagnosis not present

## 2023-05-31 DIAGNOSIS — M79672 Pain in left foot: Secondary | ICD-10-CM | POA: Diagnosis not present

## 2023-05-31 LAB — CUP PACEART REMOTE DEVICE CHECK
Battery Remaining Longevity: 34 mo
Battery Remaining Percentage: 24 %
Battery Voltage: 2.9 V
Brady Statistic RV Percent Paced: 21 %
Date Time Interrogation Session: 20240925022449
Implantable Lead Connection Status: 753985
Implantable Lead Implant Date: 20130528
Implantable Lead Location: 753860
Implantable Lead Model: 1948
Implantable Pulse Generator Implant Date: 20130528
Lead Channel Impedance Value: 640 Ohm
Lead Channel Pacing Threshold Amplitude: 0.75 V
Lead Channel Pacing Threshold Pulse Width: 0.4 ms
Lead Channel Sensing Intrinsic Amplitude: 12 mV
Lead Channel Setting Pacing Amplitude: 2.5 V
Lead Channel Setting Pacing Pulse Width: 0.4 ms
Lead Channel Setting Sensing Sensitivity: 2 mV
Pulse Gen Model: 1210
Pulse Gen Serial Number: 7328888

## 2023-06-06 ENCOUNTER — Ambulatory Visit: Payer: PPO | Admitting: Interventional Cardiology

## 2023-06-16 NOTE — Progress Notes (Signed)
Remote pacemaker transmission.   

## 2023-06-20 ENCOUNTER — Ambulatory Visit: Payer: PPO | Admitting: Family Medicine

## 2023-07-31 ENCOUNTER — Encounter: Payer: Self-pay | Admitting: Medical

## 2023-07-31 ENCOUNTER — Ambulatory Visit (INDEPENDENT_AMBULATORY_CARE_PROVIDER_SITE_OTHER): Payer: PPO | Admitting: Medical

## 2023-07-31 ENCOUNTER — Ambulatory Visit: Payer: PPO | Admitting: Family Medicine

## 2023-07-31 VITALS — BP 143/89 | HR 66 | Resp 18 | Ht 72.0 in | Wt 203.6 lb

## 2023-07-31 DIAGNOSIS — R739 Hyperglycemia, unspecified: Secondary | ICD-10-CM

## 2023-07-31 DIAGNOSIS — E782 Mixed hyperlipidemia: Secondary | ICD-10-CM | POA: Diagnosis not present

## 2023-07-31 DIAGNOSIS — R7989 Other specified abnormal findings of blood chemistry: Secondary | ICD-10-CM

## 2023-07-31 DIAGNOSIS — R944 Abnormal results of kidney function studies: Secondary | ICD-10-CM

## 2023-07-31 DIAGNOSIS — I10 Essential (primary) hypertension: Secondary | ICD-10-CM

## 2023-07-31 LAB — LIPID PANEL
Cholesterol: 114 mg/dL (ref 0–200)
HDL: 45.5 mg/dL (ref >39.00)
LDL Cholesterol: 53 mg/dL (ref 0–99)
NonHDL: 68.94
Total CHOL/HDL Ratio: 3
Triglycerides: 79 mg/dL (ref 0.0–149.0)
VLDL: 15.8 mg/dL (ref 0.0–40.0)

## 2023-07-31 LAB — COMPREHENSIVE METABOLIC PANEL
ALT: 16 U/L (ref 0–53)
AST: 22 U/L (ref 0–37)
Albumin: 4.1 g/dL (ref 3.5–5.2)
Alkaline Phosphatase: 64 U/L (ref 39–117)
BUN: 19 mg/dL (ref 6–23)
CO2: 31 meq/L (ref 19–32)
Calcium: 9.2 mg/dL (ref 8.4–10.5)
Chloride: 104 meq/L (ref 96–112)
Creatinine, Ser: 1.35 mg/dL (ref 0.40–1.50)
GFR: 47.75 mL/min — ABNORMAL LOW (ref >60.00)
Glucose, Bld: 93 mg/dL (ref 70–99)
Potassium: 4.2 meq/L (ref 3.5–5.1)
Sodium: 143 meq/L (ref 135–145)
Total Bilirubin: 0.9 mg/dL (ref 0.2–1.2)
Total Protein: 6.9 g/dL (ref 6.0–8.3)

## 2023-07-31 LAB — CBC WITH DIFFERENTIAL/PLATELET
Basophils Absolute: 0.1 10*3/uL (ref 0.0–0.1)
Basophils Relative: 1.8 % (ref 0.0–3.0)
Eosinophils Absolute: 0.1 10*3/uL (ref 0.0–0.7)
Eosinophils Relative: 2.6 % (ref 0.0–5.0)
HCT: 41.7 % (ref 39.0–52.0)
Hemoglobin: 13.6 g/dL (ref 13.0–17.0)
Lymphocytes Relative: 19.4 % (ref 12.0–46.0)
Lymphs Abs: 1.1 10*3/uL (ref 0.7–4.0)
MCHC: 32.7 g/dL (ref 30.0–36.0)
MCV: 94.9 fL (ref 78.0–100.0)
Monocytes Absolute: 0.7 10*3/uL (ref 0.1–1.0)
Monocytes Relative: 13.3 % — ABNORMAL HIGH (ref 3.0–12.0)
Neutro Abs: 3.5 10*3/uL (ref 1.4–7.7)
Neutrophils Relative %: 62.9 % (ref 43.0–77.0)
Platelets: 136 10*3/uL — ABNORMAL LOW (ref 150.0–400.0)
RBC: 4.39 Mil/uL (ref 4.22–5.81)
RDW: 13.9 % (ref 11.5–15.5)
WBC: 5.6 10*3/uL (ref 4.0–10.5)

## 2023-07-31 LAB — HEMOGLOBIN A1C: Hgb A1c MFr Bld: 5.6 % (ref 4.6–6.5)

## 2023-07-31 NOTE — Patient Instructions (Signed)
Hyperlipidemia Lightheadedness with rosuvastatin 20mg , improved with dose reduction to 10mg . -Continue rosuvastatin 10mg  daily. -Check lipid panel today.  Chronic Kidney Disease Last GFR was 48, creatinine 1.44. -Check metabolic panel today. -Continue hydration and high protein intake.  Elevated sugar on prior labs  Last sugar was elevated. -Check A1c today.  Atrial Fibrillation Rate controlled on current regimen (Xarelto, Metoprolol 100mg  BID, Cardizem CD 240mg  daily). -Continue current medications.   htn- bp 143/89. Your report better level at times at home if you see trend consistently above 140/90 like presently then consider other bp med.  General Health Maintenance -Confirm receipt of flu vaccine. -Follow up date to be determined based on lab review.

## 2023-07-31 NOTE — Progress Notes (Signed)
Subjective:    Patient ID: Brian Davidson, male    DOB: Dec 11, 1935, 87 y.o.   MRN: [id number]  HPI  Pt in for follow up.   " Chronic renal impairment, unspecified CKD stage Assessment & Plan: Hydrate and monitor      Hyperglycemia Assessment & Plan: hgba1c acceptable, minimize simple carbs. Increase exercise as tolerated.    Orders: -     Hemoglobin A1c   Mixed hyperlipidemia Assessment & Plan: Encourage heart healthy diet such as MIND or DASH diet, increase exercise, avoid trans fats, simple carbohydrates and processed foods, consider a krill or fish or flaxseed oil cap daily. Tolerating Rosuvastatin     Primary hypertension Assessment & Plan: Added Losartan at last visit   Orders: -     CBC with Differential/Platelet -     Comprehensive metabolic panel -     Lipid panel -     TSH   Hypoalbuminemia Assessment & Plan: Needs to maintain high protein intake and will monitor     Thiamine deficiency Assessment & Plan: Supplement and monitor    Orders: -     Vitamin B1   Preventative health care Assessment & Plan: Patient encouraged to maintain heart healthy diet, regular exercise, adequate sleep. Consider daily probiotics. Take medications as prescribed Labs ordered and reviewed. Given and reviewed copy of ACP documents from [employer] and encouraged to complete and return. Has aged out of colonoscopies"  Discussed the use of AI scribe software for clinical note transcription with the patient, who gave verbal consent to proceed.  History of Present Illness   The patient, with a history of atrial fibrillation, hyperlipidemia, and altered taste sensation post-COVID infection, presents for a routine follow-up. He reports a persistent salty taste in his mouth, which has been ongoing for approximately four years since his COVID infection. Despite this, he has adapted and continues to manage his daily activities.  The patient was previously on  rosuvastatin 20mg  for hyperlipidemia, but experienced episodes of lightheadedness, particularly during physical activity. After consultation with his cardiologist, the dose was reduced to 10mg , which has seemingly resolved the lightheadedness. He continues to stay well-hydrated as part of his management plan.  In terms of his atrial fibrillation, he is currently on Xarelto, metoprolol (100mg  twice daily), and Cardizem (240mg  daily). He reports good tolerance to these medications. He was previously on losartan, but it appears this was discontinued by his cardiologist.  The patient also has a history of low thiamine levels, but he was taken off thiamine supplements after his levels normalized.    Low albumin and advised to maintain a high protein intake due to past low albumin levels.  Lastly, the patient received his flu vaccine in August. He is diligent about his health and has been proactive in managing his conditions.          Review of Systems  Constitutional:  Negative for chills, fatigue and fever.  HENT:  Negative for congestion and drooling.   Respiratory:  Negative for cough, chest tightness and wheezing.   Cardiovascular:  Negative for chest pain and palpitations.  Gastrointestinal:  Negative for abdominal pain and constipation.  Genitourinary:  Negative for dysuria and frequency.  Musculoskeletal:  Negative for back pain and myalgias.  Neurological:  Negative for dizziness, syncope, weakness and light-headedness.  Hematological:  Negative for adenopathy. Does not bruise/bleed easily.  Psychiatric/Behavioral:  Negative for behavioral problems and confusion.     Past Medical History:  Diagnosis Date  Allergy    Arthritis    Biliary dyskinesia    Carotid artery disease (HCC) 07/01/2016   Dyslipidemia 07/13/2017   Dysrhythmia    a fib   GERD (gastroesophageal reflux disease)    occasional   H/O measles    History of chicken pox    History of kidney stones     Hypertension    Neuromuscular disorder (HCC)    neuropathy feet    Persistent atrial fibrillation (HCC)    Pneumonia 1992   Presence of permanent cardiac pacemaker    Prostate cancer (HCC)    Tachycardia-bradycardia (HCC)    a. 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 He is active around the house and yard...   Parents are deceased and did not have cardiac issues but he has 2 siblings with atrial fibrillation and a sister-in-law  has a pacemaker.   Social Determinants of Health   Financial Resource Strain: Low Risk  (12/26/2022)   Overall Financial Resource Strain (CARDIA)    Difficulty of Paying Living Expenses: Not hard at all  Food Insecurity: No Food Insecurity (12/26/2022)   Hunger Vital Sign    Worried About Running Out of Food in the Last Year: Never true    Ran Out of Food in the Last Year: Never true  Transportation Needs: No Transportation Needs (12/26/2022)   PRAPARE - Administrator, Civil Service (Medical): No    Lack of Transportation (Non-Medical): No  Physical Activity: Sufficiently Active (12/26/2022)   Exercise Vital  Sign    Days of Exercise per Week: 5 days    Minutes of Exercise per Session: 70 min  Stress: No Stress Concern Present (12/26/2022)   [employer] of Occupational Health - Occupational Stress Questionnaire    Feeling of Stress : Not at all  Social Connections: Unknown (12/26/2022)   Social Connection and Isolation Panel [NHANES]    Frequency of Communication with Friends and Family: Three times a week    Frequency of Social Gatherings with Friends and Family: Twice a week    Attends Religious Services: Not on Marketing executive or Organizations: No    Attends Banker Meetings: Never    Marital Status: Married  Catering manager Violence: Not At Risk (01/02/2023)   Humiliation, Afraid, Rape, and Kick questionnaire    Fear of Current or Ex-Partner: No    Emotionally Abused: No    Physically Abused: No  Sexually Abused: No    Past Surgical History:  Procedure Laterality Date   CHOLECYSTECTOMY N/A 11/24/2015   Procedure: LAPAROSCOPIC CHOLECYSTECTOMY;  Surgeon: Gaynelle Adu, MD;  Location: [facility];  Service: General;  Laterality: N/A;   CYSTOSCOPY  2016   EYE SURGERY     bil cataract   FRACTURE SURGERY  2005   left ankle   HERNIA REPAIR     left groin, no mesh   JOINT REPLACEMENT     PERMANENT PACEMAKER INSERTION N/A 01/31/2012   SJM Accent SR RF implanted by DR Allred   TOTAL KNEE ARTHROPLASTY Right 12/23/2019   Procedure: TOTAL KNEE ARTHROPLASTY;  Surgeon: Ollen Gross, MD;  Location: [facility];  Service: Orthopedics;  Laterality: Right;     Family History  Problem Relation Age of Onset   Neuropathy Mother    Hypertension Mother    Arthritis Mother    Drug abuse Mother    Stroke Father    Heart disease Father    Prostate cancer Father        not treated   Heart disease Sister 9   Heart disease Sister    Heart disease Brother    Neuropathy Brother    Hypertension Brother    Hypertension Daughter    Hypertension Son    Colon cancer  Neg Hx     Allergies  Allergen Reactions   Cardizem [Diltiazem]     Rash from Yellow dye in generic capsule.   Can take different brand. Takes diltiazem - his allergy is to Cardizem   Sulfa Antibiotics     Unknown childhood reaction    Current Outpatient Medications on File Prior to Visit  Medication Sig Dispense Refill   Calcium Carbonate-Vitamin D (CALTRATE 600+D PO) Take 600 mg by mouth daily.      Cholecalciferol 50 MCG (2000 UT) TABS Take 2,000 Units by mouth daily.      diclofenac Sodium (VOLTAREN) 1 % GEL Apply 2 g topically 4 (four) times daily. (Patient taking differently: Apply 2 g topically as needed (pain).) 350 g 2   diltiazem (CARDIZEM CD) 240 MG 24 hr capsule TAKE ONE (1) CAPSULE EACH DAY BY MOUTH 90 capsule 1   fluticasone (FLONASE) 50 MCG/ACT nasal spray Place 2 sprays into both nostrils daily as needed for allergies or rhinitis.     furosemide (LASIX) 20 MG tablet Take 1 tablet (20 mg total) by mouth daily. 90 tablet 3   gabapentin (NEURONTIN) 300 MG capsule Take 1 capsule by mouth twice daily 180 capsule 1   metoprolol tartrate (LOPRESSOR) 100 MG tablet Take 1 tablet by mouth twice daily 180 tablet 0   Multiple Vitamins-Minerals (MULTIVITAMIN WITH MINERALS) tablet Take 1 tablet by mouth daily. 30 tablet 3   omeprazole (PRILOSEC) 40 MG capsule as needed (heartburn).     rosuvastatin (CRESTOR) 10 MG tablet Take 1 tablet by mouth once daily 90 tablet 3   Turmeric 500 MG TABS Take 500 mg by mouth daily.      XARELTO 20 MG TABS tablet Take 1 tablet by mouth once daily 90 tablet 0   No current facility-administered medications on file prior to visit.    BP (!) 143/89   Pulse 66   Resp 18   Ht 6' (1.829 m)   Wt 203 lb 9.6 oz (92.4 kg)   SpO2 99%   BMI 27.61 kg/m        Objective:   Physical Exam  General Mental Status- Alert. General  Appearance- Not in acute distress.   Skin General: Color- Normal Color. Moisture- Normal Moisture.  Neck Carotid  Arteries- Normal color. Moisture- Normal Moisture. No carotid bruits. No JVD.  Chest and Lung Exam Auscultation: Breath Sounds:-Normal.  Cardiovascular Auscultation:Rythm- Regular. Murmurs & Other Heart Sounds:Auscultation of the heart reveals- No Murmurs.  Abdomen Inspection:-Inspeection Normal. Palpation/Percussion:Note:No mass. Palpation and Percussion of the abdomen reveal- Non Tender, Non Distended + BS, no rebound or guarding.   Neurologic Cranial Nerve exam:- CN III-XII intact(No nystagmus), symmetric smile. Strength:- 5/5 equal and symmetric strength both upper and lower extremities.       Assessment & Plan:   Assessment and Plan    Hyperlipidemia Lightheadedness with rosuvastatin 20mg , improved with dose reduction to 10mg . -Continue rosuvastatin 10mg  daily. -Check lipid panel today.  Chronic Kidney Disease Last GFR was 48, creatinine 1.44. -Check metabolic panel today. -Continue hydration and high protein intake.  Elevated sugar on prior labs  Last sugar was elevated. -Check A1c today.  Atrial Fibrillation Rate controlled on current regimen (Xarelto, Metoprolol 100mg  BID, Cardizem CD 240mg  daily). -Continue current medications.   htn- bp 143/89. Your report better level at times at home if you see trend consistently above 140/90 like presently then consider other bp med.  General Health Maintenance -Confirm receipt of flu vaccine. -Follow up date to be determined based on lab review.        Esperanza Richters, PA-C

## 2023-08-07 DIAGNOSIS — L84 Corns and callosities: Secondary | ICD-10-CM | POA: Diagnosis not present

## 2023-08-07 DIAGNOSIS — B351 Tinea unguium: Secondary | ICD-10-CM | POA: Diagnosis not present

## 2023-08-07 DIAGNOSIS — M79671 Pain in right foot: Secondary | ICD-10-CM | POA: Diagnosis not present

## 2023-08-07 DIAGNOSIS — M79672 Pain in left foot: Secondary | ICD-10-CM | POA: Diagnosis not present

## 2023-08-08 DIAGNOSIS — L821 Other seborrheic keratosis: Secondary | ICD-10-CM | POA: Diagnosis not present

## 2023-08-08 DIAGNOSIS — L570 Actinic keratosis: Secondary | ICD-10-CM | POA: Diagnosis not present

## 2023-08-08 DIAGNOSIS — D485 Neoplasm of uncertain behavior of skin: Secondary | ICD-10-CM | POA: Diagnosis not present

## 2023-08-08 DIAGNOSIS — L814 Other melanin hyperpigmentation: Secondary | ICD-10-CM | POA: Diagnosis not present

## 2023-08-08 DIAGNOSIS — D2362 Other benign neoplasm of skin of left upper limb, including shoulder: Secondary | ICD-10-CM | POA: Diagnosis not present

## 2023-08-08 DIAGNOSIS — D0422 Carcinoma in situ of skin of left ear and external auricular canal: Secondary | ICD-10-CM | POA: Diagnosis not present

## 2023-08-08 NOTE — Progress Notes (Unsigned)
  Electrophysiology Office Follow up Visit Note:    Date:  08/09/2023   ID:  Brian Davidson, DOB 1935/09/07, Brian Davidson  PCP:  Bradd Canary, MD  [facility] Cardiologist:  Lance Muss, MD  [facility] Electrophysiologist:  Lanier Prude, MD    Interval History:    Brian Davidson is a 87 y.o. male who presents for a follow up visit.  The patient was previously followed by Dr. Johney Frame.  The patient last saw Curlene Dolphin in clinic August 09, 2022.  He has a permanent pacemaker that was implanted 2013 for symptomatic bradycardia and permanent atrial fibrillation.  He is on Xarelto for stroke prophylaxis.   Discussed the use of AI scribe software for clinical note transcription with the patient, who gave verbal consent to proceed.  History of Present Illness   The patient, with a history of a pacemaker, presents for a routine check. He reports no issues with the device and overall, he has been 'pretty good.' He has not been checking his blood pressure at home recently, but when he did, it was typically in the 120s/low. Today, his blood pressure was noted to be high, which he attributes to the cold weather and cessation of regular walking due to the cold. He denies any changes in his diet.  The patient also mentions a previous ankle injury, but it is unclear if this is currently causing any issues. He has no questions or concerns about his pacemaker, and he is informed that the battery life is approximately 2.7 years.            Past medical, surgical, social and family history were reviewed.  ROS:   Please see the history of present illness.    All other systems reviewed and are negative.  EKGs/Labs/Other Studies Reviewed:    The following studies were reviewed today:  August 09, 2023 in clinic device interrogation personally reviewed Battery longevity 2.7 years Lead parameter stable No changes made in programming today        Physical Exam:    VS:   BP (!) 150/82   Pulse 69   Ht 6' (1.829 m)   Wt 202 lb (91.6 kg)   SpO2 98%   BMI 27.40 kg/m     Wt Readings from Last 3 Encounters:  08/09/23 202 lb (91.6 kg)  07/31/23 203 lb 9.6 oz (92.4 kg)  04/03/23 192 lb (87.1 kg)     GEN: no distress CARD: RRR, No MRG.  CIED pocket well-healed RESP: No IWOB. CTAB.      ASSESSMENT:    1. Tachycardia-bradycardia syndrome (HCC)   2. Permanent atrial fibrillation (HCC)   3. Pacemaker    PLAN:    In order of problems listed above:  #Symptomatic bradycardia #Pacemaker in situ Device functioning appropriately Continue remote monitoring  #Permanent atrial fibrillation Continue Xarelto    Follow-up 1 year with APP.      Signed, Steffanie Dunn, MD, [facility], [facility] 08/09/2023 1:55 PM    Electrophysiology [facility]

## 2023-08-09 ENCOUNTER — Ambulatory Visit: Payer: PPO | Attending: Cardiology | Admitting: Cardiology

## 2023-08-09 VITALS — BP 150/82 | HR 69 | Ht 72.0 in | Wt 202.0 lb

## 2023-08-09 DIAGNOSIS — I495 Sick sinus syndrome: Secondary | ICD-10-CM

## 2023-08-09 DIAGNOSIS — I4821 Permanent atrial fibrillation: Secondary | ICD-10-CM

## 2023-08-09 DIAGNOSIS — Z950 Presence of cardiac pacemaker: Secondary | ICD-10-CM

## 2023-08-09 NOTE — Patient Instructions (Signed)
 Medication Instructions:  Your physician recommends that you continue on your current medications as directed. Please refer to the Current Medication list given to you today.  *If you need a refill on your cardiac medications before your next appointment, please call your pharmacy*  Follow-Up: At [facility], you and your health needs are our priority.  As part of our continuing mission to provide you with exceptional heart care, we have created designated Provider Care Teams.  These Care Teams include your primary Cardiologist (physician) and Advanced Practice Providers (APPs -  Physician Assistants and Nurse Practitioners) who all work together to provide you with the care you need, when you need it.  Your next appointment:   1 year  Provider:   You will see one of the following Advanced Practice Providers on your designated Care Team:   Francis Dowse, Charlott Holler [address]" [place], [place] Sherie Don, NP Canary Brim, NP

## 2023-08-13 ENCOUNTER — Other Ambulatory Visit: Payer: Self-pay | Admitting: Family Medicine

## 2023-08-14 ENCOUNTER — Other Ambulatory Visit: Payer: Self-pay | Admitting: Family Medicine

## 2023-08-25 ENCOUNTER — Other Ambulatory Visit (HOSPITAL_BASED_OUTPATIENT_CLINIC_OR_DEPARTMENT_OTHER): Payer: Self-pay

## 2023-08-25 ENCOUNTER — Ambulatory Visit: Payer: Self-pay | Admitting: Family Medicine

## 2023-08-25 ENCOUNTER — Ambulatory Visit (INDEPENDENT_AMBULATORY_CARE_PROVIDER_SITE_OTHER): Payer: PPO | Admitting: Medical

## 2023-08-25 VITALS — BP 133/60 | HR 72 | Temp 98.3°F | Ht 72.0 in | Wt 206.2 lb

## 2023-08-25 DIAGNOSIS — D696 Thrombocytopenia, unspecified: Secondary | ICD-10-CM | POA: Diagnosis not present

## 2023-08-25 DIAGNOSIS — K120 Recurrent oral aphthae: Secondary | ICD-10-CM

## 2023-08-25 MED ORDER — NYSTATIN 100000 UNIT/ML MT SUSP
OROMUCOSAL | 0 refills | Status: DC
  Filled 2023-08-25: qty 240, 12d supply, fill #0

## 2023-08-25 NOTE — Telephone Encounter (Addendum)
   Chief Complaint: bloody mouth Symptoms: bleeding Frequency: na  Disposition: [] ED /[] Urgent Care (no appt availability in office) / [x] Appointment(In office/virtual)/ []  [facility]/ [] Home Care/ [] Refused Recommended Disposition /[] [facility]/ []  Follow-up with PCP Additional Notes: Pt was eating lunch 12/19 and felt something that was similar to what a blood blister feels like on right cheek. He ran finger over it and it popped. Pt stated it was a lot of blood. Pt stated he wears dentures but doesn't think he bit. Wife stated It looks like a blood blister that popped and is size of a dime. It is not currently bleeding, but pt feels "saggy" skin and wants it to be checked out. Pt denies pain/SOB. Pt has appt today at 1340 with PA. Pt instructed to call if symptoms worsen before appt.  Pt verbalized understanding.         Copied from CRM [id number]. Topic: Clinical - Red Word Triage >> Aug 25, 2023 10:18 AM Steele Sizer wrote: Red Word that prompted transfer to Nurse Triage: PT stated that his mouth was bleeding and felt like a blood blister located on his right jaw. Reason for Disposition  [1] Looks infected (increasing pain, redness or swelling after 48 hrs) AND [2] no fever  Answer Assessment - Initial Assessment Questions 1. MECHANISM: "How did the injury happen?"      eating 2. ONSET: "When did the injury happen?" (Minutes or hours ago)      Yesterday while eating lunch 3. LOCATION: "What part of the mouth is injured?"      Right inside of  cheek 4. APPEARANCE of INJURY: "What does the mouth look like?"      Unsure-wife thinks it looks like blood blister 5. BLEEDING: "Is the mouth still bleeding?" If Yes, ask: "Is it difficult to stop?"      No bleeding currently 6. SIZE: For cuts, bruises, or swelling, ask: "How large is it?" (e.g., inches or centimeters; entire lip)      Size of a dime 7. PAIN: "Is it painful?" If Yes, ask: "How bad is the pain?"   (e.g.,  Scale 1-10; or mild, moderate, severe)     No pain 8. TETANUS: For any breaks in the skin, ask: "When was the last tetanus booster?"     No sure 9. OTHER SYMPTOMS: "Are you having any trouble breathing?"     denies  Protocols used: Mouth Injury-A-AH

## 2023-08-25 NOTE — Patient Instructions (Signed)
Oral Ulcer Large aphthous ulcer (10mm) on buccal mucosa. No pain, but patient can feel it with his tongue. No history of trauma or burn. -Prescribe magic mouthwash, swish and spit four times a day. -Avoid salty foods. -Order labs including B12, folate, iron, and B1 levels. -Consider referr alto ENT for further evaluation due to the size of the ulcer. -Schedule follow-up appointment in 10 days. if area heals completely with no discoloration may not need ent appt.  Follow up 2 weeks or sooner if needed.

## 2023-08-25 NOTE — Addendum Note (Signed)
Addended by: Mervin Kung A on: 08/25/2023 03:06 PM   Modules accepted: Orders

## 2023-08-25 NOTE — Progress Notes (Signed)
   Subjective:    Patient ID: Brian Davidson, male    DOB: 02-Jun-1936, 87 y.o.   MRN: [id number]  HPI  Discussed the use of AI scribe software for clinical note transcription with the patient, who gave verbal consent to proceed.  History of Present Illness   The patient, with a history of wearing dentures, presented with a sudden onset oral lesion. The lesion was first noticed during a meal when the patient felt an unusual sensation in the back of his mouth, initially mistaken for food lodged under his upper denture. Despite attempts to dislodge the perceived food particle with drinking, the sensation persisted. On self-examination, the patient discovered a lesion in the buccal mucosa, which bled profusely upon manipulation. The bleeding was controlled within approximately thirty minutes by holding ice water in the mouth. The patient reported no pain associated with the lesion, but was aware of its presence. The lesion was described as a large aphthous ulcer, measuring approximately ten millimeters. The patient denied any history of similar lesions or recent oral trauma, such as burns.       No hx of chewing tobacco.  Review of Systems See hpi    Objective:   Physical Exam  General- no acute distress HEENT: Trauma in buccal mucosa, large, approximately 10 millimeters, non-tender, with history of bleeding. No lymphandeopathy Lungs- cta Heart rrr    Assessment & Plan:   Oral Ulcer Large aphthous ulcer (10mm) on buccal mucosa. No pain, but patient can feel it with his tongue. No history of trauma or burn. -Prescribe magic mouthwash, swish and spit four times a day. -Avoid salty foods. -Order labs including B12, folate, iron, and B1 levels. -Consider referr alto ENT for further evaluation due to the size of the ulcer. -Schedule follow-up appointment in 10 days. if area heals completely with no discoloration may not need ent appt.  Follow up 2 weeks or sooner if needed.

## 2023-08-26 LAB — CBC WITH DIFFERENTIAL/PLATELET
Absolute Lymphocytes: 1015 {cells}/uL (ref 850–3900)
Absolute Monocytes: 847 {cells}/uL (ref 200–950)
Basophils Absolute: 41 {cells}/uL (ref 0–200)
Basophils Relative: 0.7 %
Eosinophils Absolute: 157 {cells}/uL (ref 15–500)
Eosinophils Relative: 2.7 %
HCT: 40.9 % (ref 38.5–50.0)
Hemoglobin: 13.5 g/dL (ref 13.2–17.1)
MCH: 30.6 pg (ref 27.0–33.0)
MCHC: 33 g/dL (ref 32.0–36.0)
MCV: 92.7 fL (ref 80.0–100.0)
MPV: 12.1 fL (ref 7.5–12.5)
Monocytes Relative: 14.6 %
Neutro Abs: 3741 {cells}/uL (ref 1500–7800)
Neutrophils Relative %: 64.5 %
Platelets: 145 10*3/uL (ref 140–400)
RBC: 4.41 10*6/uL (ref 4.20–5.80)
RDW: 12.2 % (ref 11.0–15.0)
Total Lymphocyte: 17.5 %
WBC: 5.8 10*3/uL (ref 3.8–10.8)

## 2023-08-26 LAB — FOLATE: Folate: >24 ng/mL

## 2023-08-26 LAB — VITAMIN B12: Vitamin B-12: 550 pg/mL (ref 200–1100)

## 2023-08-26 LAB — IRON: Iron: 63 ug/dL (ref 50–180)

## 2023-08-30 LAB — VITAMIN B1: Vitamin B1 (Thiamine): 39 nmol/L — ABNORMAL HIGH (ref 8–30)

## 2023-08-31 ENCOUNTER — Ambulatory Visit (INDEPENDENT_AMBULATORY_CARE_PROVIDER_SITE_OTHER): Payer: PPO

## 2023-08-31 DIAGNOSIS — I495 Sick sinus syndrome: Secondary | ICD-10-CM | POA: Diagnosis not present

## 2023-09-02 LAB — CUP PACEART REMOTE DEVICE CHECK
Battery Remaining Longevity: 31 mo
Battery Remaining Percentage: 22 %
Battery Voltage: 2.9 V
Brady Statistic RV Percent Paced: 25 %
Date Time Interrogation Session: 20241226235759
Implantable Lead Connection Status: 753985
Implantable Lead Implant Date: 20130528
Implantable Lead Location: 753860
Implantable Lead Model: 1948
Implantable Pulse Generator Implant Date: 20130528
Lead Channel Impedance Value: 660 Ohm
Lead Channel Pacing Threshold Amplitude: 0.5 V
Lead Channel Pacing Threshold Pulse Width: 0.4 ms
Lead Channel Sensing Intrinsic Amplitude: 12 mV
Lead Channel Setting Pacing Amplitude: 2.5 V
Lead Channel Setting Pacing Pulse Width: 0.4 ms
Lead Channel Setting Sensing Sensitivity: 2 mV
Pulse Gen Model: 1210
Pulse Gen Serial Number: 7328888

## 2023-09-04 ENCOUNTER — Ambulatory Visit (INDEPENDENT_AMBULATORY_CARE_PROVIDER_SITE_OTHER): Payer: PPO | Admitting: Medical

## 2023-09-04 ENCOUNTER — Other Ambulatory Visit: Payer: Self-pay | Admitting: Family Medicine

## 2023-09-04 VITALS — BP 134/78 | HR 66 | Temp 97.6°F | Resp 18 | Ht 72.0 in | Wt 207.0 lb

## 2023-09-04 DIAGNOSIS — K120 Recurrent oral aphthae: Secondary | ICD-10-CM

## 2023-09-04 NOTE — Progress Notes (Signed)
Subjective:    Patient ID: Brian Davidson, male    DOB: [birth year], 87 y.o.   MRN: [id number]  HPI  Pt in for follow up.  Pt recent labs showed high b1 level. Pt denies any supplements.  He is here for follow up also on large aphthous ulcer(seen 10 days ago). He state ulcer healed in 6 days. He states magic mouthwash helped. Area healed in about 6 days.  For ulcers order b-12, b1, iron and folate. No low levels. No significant high levels.    Review of Systems  Constitutional:  Negative for chills, fatigue and fever.  Respiratory:  Negative for cough, choking and wheezing.   Cardiovascular:  Negative for chest pain and palpitations.  Gastrointestinal:  Negative for abdominal pain.  Genitourinary:  Negative for dysuria, frequency, testicular pain and urgency.  Musculoskeletal:  Negative for back pain, joint swelling and neck pain.  Neurological:  Negative for dizziness and headaches.  Hematological:  Negative for adenopathy. Does not bruise/bleed easily.  Psychiatric/Behavioral:  Negative for behavioral problems and confusion.     Past Medical History:  Diagnosis Date   Allergy    Arthritis    Biliary dyskinesia    Carotid artery disease (HCC) 07/01/2016   Dyslipidemia 07/13/2017   Dysrhythmia    a fib   GERD (gastroesophageal reflux disease)    occasional   H/O measles    History of chicken pox    History of kidney stones    Hypertension    Neuromuscular disorder (HCC)    neuropathy feet    Persistent atrial fibrillation (HCC)    Pneumonia 1992   Presence of permanent cardiac pacemaker    Prostate cancer (HCC)    Tachycardia-bradycardia (HCC)    a. 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 He is active around the house and yard...   Parents are deceased and did not have cardiac issues but he has 2 siblings with atrial fibrillation and a sister-in-law  has a pacemaker.   Social Drivers of Corporate investment banker Strain: Low Risk  (12/26/2022)   Overall Financial Resource Strain (CARDIA)    Difficulty of Paying Living Expenses: Not hard at all  Food Insecurity: No Food Insecurity (12/26/2022)   Hunger Vital Sign    Worried About Running Out of Food in the Last Year: Never true    Ran Out of Food in the Last Year: Never true  Transportation Needs: No Transportation Needs (  12/26/2022)   PRAPARE - Administrator, Civil Service (Medical): No    Lack of Transportation (Non-Medical): No  Physical Activity: Sufficiently Active (12/26/2022)   Exercise Vital Sign    Days of Exercise per Week: 5 days    Minutes of Exercise per Session: 70 min  Stress: No Stress Concern Present (12/26/2022)   [employer] of Occupational Health - Occupational Stress Questionnaire    Feeling of Stress : Not at all  Social Connections: Unknown (12/26/2022)   Social Connection and Isolation Panel [NHANES]    Frequency of Communication with Friends and Family: Three times a week    Frequency of Social Gatherings with Friends and Family: Twice a week    Attends Religious Services: Not on file    Active Member of  Clubs or Organizations: No    Attends Banker Meetings: Never    Marital Status: Married  Catering manager Violence: Not At Risk (01/02/2023)   Humiliation, Afraid, Rape, and Kick questionnaire    Fear of Current or Ex-Partner: No    Emotionally Abused: No    Physically Abused: No    Sexually Abused: No    Past Surgical History:  Procedure Laterality Date   CHOLECYSTECTOMY N/A 11/24/2015   Procedure: LAPAROSCOPIC CHOLECYSTECTOMY;  Surgeon: Gaynelle Adu, MD;  Location: [facility];  Service: General;  Laterality: N/A;   CYSTOSCOPY  2016   EYE SURGERY     bil cataract   FRACTURE SURGERY  2005   left ankle   HERNIA REPAIR     left groin, no mesh   JOINT REPLACEMENT     PERMANENT PACEMAKER INSERTION N/A 01/31/2012   SJM Accent SR RF implanted by DR Allred   TOTAL KNEE ARTHROPLASTY Right 12/23/2019   Procedure: TOTAL KNEE ARTHROPLASTY;  Surgeon: Ollen Gross, MD;  Location: [facility];  Service: Orthopedics;  Laterality: Right;     Family History  Problem Relation Age of Onset   Neuropathy Mother    Hypertension Mother    Arthritis Mother    Drug abuse Mother    Stroke Father    Heart disease Father    Prostate cancer Father        not treated   Heart disease Sister 7   Heart disease Sister    Heart disease Brother    Neuropathy Brother    Hypertension Brother    Hypertension Daughter    Hypertension Son    Colon cancer Neg Hx     Allergies  Allergen Reactions   Cardizem [Diltiazem]     Rash from Yellow dye in generic capsule.   Can take different brand. Takes diltiazem - his allergy is to Cardizem   Sulfa Antibiotics     Unknown childhood reaction    Current Outpatient Medications on File Prior to Visit  Medication Sig Dispense Refill   Calcium Carbonate-Vitamin D (CALTRATE 600+D PO) Take 600 mg by mouth daily.      Cholecalciferol 50 MCG (2000 UT) TABS Take 2,000 Units by mouth daily.      diclofenac Sodium (VOLTAREN) 1 % GEL Apply 2 g topically 4  (four) times daily. (Patient taking differently: Apply 2 g topically as needed (pain).) 350 g 2   diltiazem (CARDIZEM CD) 240 MG 24 hr capsule TAKE ONE (1) CAPSULE EACH DAY BY MOUTH 90 capsule 1   fluticasone (FLONASE) 50 MCG/ACT nasal spray Place 2 sprays into both nostrils daily as needed for allergies or rhinitis.  furosemide (LASIX) 20 MG tablet Take 1 tablet (20 mg total) by mouth daily. 90 tablet 3   gabapentin (NEURONTIN) 300 MG capsule Take 1 capsule by mouth twice daily 180 capsule 0   metoprolol tartrate (LOPRESSOR) 100 MG tablet Take 1 tablet by mouth twice daily 180 tablet 0   Multiple Vitamins-Minerals (MULTIVITAMIN WITH MINERALS) tablet Take 1 tablet by mouth daily. 30 tablet 3   omeprazole (PRILOSEC) 40 MG capsule as needed (heartburn).     rosuvastatin (CRESTOR) 10 MG tablet Take 1 tablet by mouth once daily 90 tablet 3   Turmeric 500 MG TABS Take 500 mg by mouth daily.      XARELTO 20 MG TABS tablet Take 1 tablet by mouth once daily 90 tablet 0   No current facility-administered medications on file prior to visit.    BP 134/78   Pulse 66   Temp 97.6 F (36.4 C)   Resp 18   Ht 6' (1.829 m)   Wt 207 lb (93.9 kg)   SpO2 100%   BMI 28.07 kg/m        Objective:   Physical Exam  General- No acute distress. Pleasant patient. Neck- Full range of motion, no jvd Lungs- Clear, even and unlabored. Heart- regular rate and rhythm. Neurologic- CNII- XII grossly intact.  Heent- no thyromegaly. No sinus pressure. No lymphadenopathy. Mouth- on inspection the apthous area is healed completed. No discoloration.      Assessment & Plan:   Patient Instructions  Large aphthous ulcer healed quickly. No concerning lab finding. Only mild high thiamine. Recommend try to cut back on dietary source if possible. On review levels have varied in past.   Decided that ENT referral not needed presently. If you get any recurrent ulcer in mouth let me know. If recurrent in same area would  refer to ENT  Follow up as regularly scheduled with pcp or sooner if needed.   Esperanza Richters, PA-C

## 2023-09-04 NOTE — Patient Instructions (Addendum)
Large aphthous ulcer healed quickly. No concerning lab finding. Only mild high thiamine. Recommend try to cut back on dietary source if possible. On review levels have varied in past.   Decided that ENT referral not needed presently. If you get any recurrent ulcer in mouth let me know. If recurrent in same area would refer to ENT  Follow up as regularly scheduled with pcp or sooner if needed.

## 2023-09-18 ENCOUNTER — Other Ambulatory Visit: Payer: Self-pay | Admitting: Family Medicine

## 2023-09-20 DIAGNOSIS — Z96651 Presence of right artificial knee joint: Secondary | ICD-10-CM | POA: Diagnosis not present

## 2023-09-20 DIAGNOSIS — M25562 Pain in left knee: Secondary | ICD-10-CM | POA: Diagnosis not present

## 2023-09-20 DIAGNOSIS — S82015A Nondisplaced osteochondral fracture of left patella, initial encounter for closed fracture: Secondary | ICD-10-CM | POA: Diagnosis not present

## 2023-09-28 DIAGNOSIS — S82002D Unspecified fracture of left patella, subsequent encounter for closed fracture with routine healing: Secondary | ICD-10-CM | POA: Diagnosis not present

## 2023-09-28 DIAGNOSIS — Z96651 Presence of right artificial knee joint: Secondary | ICD-10-CM | POA: Diagnosis not present

## 2023-09-28 DIAGNOSIS — M25462 Effusion, left knee: Secondary | ICD-10-CM | POA: Diagnosis not present

## 2023-09-28 DIAGNOSIS — M25562 Pain in left knee: Secondary | ICD-10-CM | POA: Diagnosis not present

## 2023-10-01 ENCOUNTER — Other Ambulatory Visit: Payer: Self-pay | Admitting: Family Medicine

## 2023-10-11 NOTE — Addendum Note (Signed)
Addended by: Elease Etienne A on: 10/11/2023 03:59 PM   Modules accepted: Orders

## 2023-10-12 DIAGNOSIS — S82002D Unspecified fracture of left patella, subsequent encounter for closed fracture with routine healing: Secondary | ICD-10-CM | POA: Diagnosis not present

## 2023-10-24 ENCOUNTER — Ambulatory Visit (INDEPENDENT_AMBULATORY_CARE_PROVIDER_SITE_OTHER): Payer: PPO | Admitting: Medical

## 2023-10-24 ENCOUNTER — Encounter: Payer: PPO | Admitting: Family Medicine

## 2023-10-24 VITALS — BP 136/60 | HR 70 | Temp 98.0°F | Resp 18 | Ht 72.0 in | Wt 198.6 lb

## 2023-10-24 DIAGNOSIS — R944 Abnormal results of kidney function studies: Secondary | ICD-10-CM

## 2023-10-24 DIAGNOSIS — I10 Essential (primary) hypertension: Secondary | ICD-10-CM

## 2023-10-24 DIAGNOSIS — E782 Mixed hyperlipidemia: Secondary | ICD-10-CM

## 2023-10-24 DIAGNOSIS — R739 Hyperglycemia, unspecified: Secondary | ICD-10-CM

## 2023-10-24 LAB — LIPID PANEL
Cholesterol: 98 mg/dL (ref 0–200)
HDL: 45.1 mg/dL (ref >39.00)
LDL Cholesterol: 37 mg/dL (ref 0–99)
NonHDL: 53.35
Total CHOL/HDL Ratio: 2
Triglycerides: 83 mg/dL (ref 0.0–149.0)
VLDL: 16.6 mg/dL (ref 0.0–40.0)

## 2023-10-24 LAB — COMPREHENSIVE METABOLIC PANEL
ALT: 15 U/L (ref 0–53)
AST: 20 U/L (ref 0–37)
Albumin: 4 g/dL (ref 3.5–5.2)
Alkaline Phosphatase: 72 U/L (ref 39–117)
BUN: 24 mg/dL — ABNORMAL HIGH (ref 6–23)
CO2: 29 meq/L (ref 19–32)
Calcium: 8.7 mg/dL (ref 8.4–10.5)
Chloride: 104 meq/L (ref 96–112)
Creatinine, Ser: 1.25 mg/dL (ref 0.40–1.50)
GFR: 52.29 mL/min — ABNORMAL LOW (ref >60.00)
Glucose, Bld: 92 mg/dL (ref 70–99)
Potassium: 4.1 meq/L (ref 3.5–5.1)
Sodium: 140 meq/L (ref 135–145)
Total Bilirubin: 1.1 mg/dL (ref 0.2–1.2)
Total Protein: 6.8 g/dL (ref 6.0–8.3)

## 2023-10-24 LAB — CBC WITH DIFFERENTIAL/PLATELET
Basophils Absolute: 0.1 10*3/uL (ref 0.0–0.1)
Basophils Relative: 1 % (ref 0.0–3.0)
Eosinophils Absolute: 0.3 10*3/uL (ref 0.0–0.7)
Eosinophils Relative: 4.3 % (ref 0.0–5.0)
HCT: 40.8 % (ref 39.0–52.0)
Hemoglobin: 13.6 g/dL (ref 13.0–17.0)
Lymphocytes Relative: 15.7 % (ref 12.0–46.0)
Lymphs Abs: 1 10*3/uL (ref 0.7–4.0)
MCHC: 33.2 g/dL (ref 30.0–36.0)
MCV: 91.9 fL (ref 78.0–100.0)
Monocytes Absolute: 0.8 10*3/uL (ref 0.1–1.0)
Monocytes Relative: 12.9 % — ABNORMAL HIGH (ref 3.0–12.0)
Neutro Abs: 4.3 10*3/uL (ref 1.4–7.7)
Neutrophils Relative %: 66.1 % (ref 43.0–77.0)
Platelets: 129 10*3/uL — ABNORMAL LOW (ref 150.0–400.0)
RBC: 4.45 Mil/uL (ref 4.22–5.81)
RDW: 14.9 % (ref 11.5–15.5)
WBC: 6.5 10*3/uL (ref 4.0–10.5)

## 2023-10-24 NOTE — Progress Notes (Signed)
Subjective:    Patient ID: SHAMMOND ARAVE, male    DOB: 04-21-1936, 88 y.o.   MRN: [id number]  HPI  Pt in for check up on chronic med problems..   Discussed the use of AI scribe software for clinical note transcription with the patient, who gave verbal consent to proceed.  History of Present Illness   GASPAR FOWLE "Rocky Link" is a 88 year old male who presents for follow-up after a left knee fracture.  Approximately five weeks ago, he fell while getting out of his truck, resulting in a fracture of his left knee/patella. His left knee, which has a history of replacement, buckled, causing him to fall on both knees. There was no damage to the hardware in the replaced knee, but the left knee sustained a fracture. The fracture was managed conservatively with a knee brace for three weeks, during which his left leg was immobilized. He is scheduled for a follow-up with his orthopedist in March. Currently, he can walk, although he experiences some soreness, which does not prevent ambulation.  He has a history of permanent atrial fibrillation, managed with Xarelto for anticoagulation and metoprolol for rate control. His pulse is well-controlled at 70 bpm. His hypertension is managed with Cardizem CD, Lasix, and Lopressor, with a current blood pressure of 136/60 mmHg, indicating effective control. He is on Crestor for cholesterol management, aiming to maintain cholesterol levels within the target range.  He has a history of GERD but reports infrequent symptoms, typically managed with Prilosec as needed, especially when consuming spicy foods. He experiences occasional nasal congestion, particularly at night, and uses Flonase as needed. Allergy symptoms are not currently problematic but may increase with the onset of spring.          Review of Systems  Constitutional:  Negative for chills, fatigue and fever.  HENT:  Negative for congestion, ear discharge, postnasal drip, sinus pressure and sneezing.    Respiratory:  Negative for cough, chest tightness, shortness of breath and wheezing.   Cardiovascular:  Negative for chest pain and palpitations.  Gastrointestinal:  Negative for abdominal pain and constipation.  Musculoskeletal:  Negative for back pain and myalgias.  Skin:  Negative for rash.  Neurological:  Negative for dizziness, syncope and light-headedness.  Hematological:  Negative for adenopathy. Does not bruise/bleed easily.  Psychiatric/Behavioral:  Negative for behavioral problems and confusion.     Past Medical History:  Diagnosis Date   Allergy    Arthritis    Biliary dyskinesia    Carotid artery disease (HCC) 07/01/2016   Dyslipidemia 07/13/2017   Dysrhythmia    a fib   GERD (gastroesophageal reflux disease)    occasional   H/O measles    History of chicken pox    History of kidney stones    Hypertension    Neuromuscular disorder (HCC)    neuropathy feet    Persistent atrial fibrillation (HCC)    Pneumonia 1992   Presence of permanent cardiac pacemaker    Prostate cancer (HCC)    Tachycardia-bradycardia (HCC)    a. 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 He is active around the house and yard...   Parents are deceased and did not have cardiac issues but he has 2 siblings with atrial fibrillation and a sister-in-law  has a pacemaker.   Social Drivers of Corporate investment banker Strain: Low Risk  (12/26/2022)   Overall Financial Resource Strain (CARDIA)    Difficulty of Paying Living Expenses: Not hard at all  Food Insecurity: No Food Insecurity (12/26/2022)   Hunger Vital Sign    Worried About Running Out of Food in the Last Year: Never true    Ran Out of Food in the Last Year: Never true  Transportation Needs: No Transportation Needs (12/26/2022)   PRAPARE - Administrator, Civil Service (Medical): No    Lack of Transportation (Non-Medical): No  Physical Activity: Sufficiently Active (12/26/2022)   Exercise Vital Sign    Days of Exercise per Week: 5 days    Minutes of Exercise per Session: 70 min  Stress: No Stress Concern Present (12/26/2022)   [employer] of Occupational Health - Occupational Stress Questionnaire    Feeling of Stress : Not at all  Social Connections: Unknown (12/26/2022)   Social Connection and Isolation Panel [NHANES]    Frequency of Communication with Friends and Family: Three times a week    Frequency of Social Gatherings with Friends and Family: Twice a week    Attends Religious Services: Not on file    Active Member of Clubs or Organizations: No    Attends Banker Meetings: Never    Marital Status: Married  Catering manager Violence: Not At Risk (01/02/2023)   Humiliation, Afraid, Rape, and Kick questionnaire    Fear of Current or Ex-Partner: No    Emotionally Abused: No    Physically Abused: No    Sexually Abused: No    Past Surgical History:  Procedure Laterality Date   CHOLECYSTECTOMY N/A 11/24/2015   Procedure: LAPAROSCOPIC CHOLECYSTECTOMY;  Surgeon: Gaynelle Adu, MD;  Location: [facility];  Service: General;  Laterality: N/A;   CYSTOSCOPY   2016   EYE SURGERY     bil cataract   FRACTURE SURGERY  2005   left ankle   HERNIA REPAIR     left groin, no mesh   JOINT REPLACEMENT     PERMANENT PACEMAKER INSERTION N/A 01/31/2012   SJM Accent SR RF implanted by DR Allred   TOTAL KNEE ARTHROPLASTY Right 12/23/2019   Procedure: TOTAL KNEE ARTHROPLASTY;  Surgeon: Ollen Gross, MD;  Location: [facility];  Service: Orthopedics;  Laterality: Right;     Family History  Problem Relation Age of Onset   Neuropathy Mother    Hypertension Mother    Arthritis Mother    Drug abuse Mother    Stroke Father    Heart disease Father    Prostate cancer Father  not treated   Heart disease Sister 11   Heart disease Sister    Heart disease Brother    Neuropathy Brother    Hypertension Brother    Hypertension Daughter    Hypertension Son    Colon cancer Neg Hx     Allergies  Allergen Reactions   Cardizem [Diltiazem]     Rash from Yellow dye in generic capsule.   Can take different brand. Takes diltiazem - his allergy is to Cardizem   Sulfa Antibiotics     Unknown childhood reaction    Current Outpatient Medications on File Prior to Visit  Medication Sig Dispense Refill   Calcium Carbonate-Vitamin D (CALTRATE 600+D PO) Take 600 mg by mouth daily.      Cholecalciferol 50 MCG (2000 UT) TABS Take 2,000 Units by mouth daily.      diclofenac Sodium (VOLTAREN) 1 % GEL Apply 2 g topically 4 (four) times daily. (Patient taking differently: Apply 2 g topically as needed (pain).) 350 g 2   diltiazem (CARDIZEM CD) 240 MG 24 hr capsule TAKE ONE (1) CAPSULE EACH DAY BY MOUTH 90 capsule 1   fluticasone (FLONASE) 50 MCG/ACT nasal spray Place 2 sprays into both nostrils daily as needed for allergies or rhinitis.     furosemide (LASIX) 20 MG tablet Take 1 tablet (20 mg total) by mouth daily. 90 tablet 3   gabapentin (NEURONTIN) 300 MG capsule Take 1 capsule by mouth twice daily 180 capsule 0   metoprolol tartrate (LOPRESSOR) 100 MG tablet  Take 1 tablet by mouth twice daily 180 tablet 0   Multiple Vitamins-Minerals (MULTIVITAMIN WITH MINERALS) tablet Take 1 tablet by mouth daily. 30 tablet 3   omeprazole (PRILOSEC) 40 MG capsule as needed (heartburn).     rosuvastatin (CRESTOR) 10 MG tablet Take 1 tablet by mouth once daily 90 tablet 3   Turmeric 500 MG TABS Take 500 mg by mouth daily.      XARELTO 20 MG TABS tablet Take 1 tablet by mouth once daily 90 tablet 0   No current facility-administered medications on file prior to visit.    BP 136/60   Pulse 70   Temp 98 F (36.7 C)   Resp 18   Ht 6' (1.829 m)   Wt 198 lb 9.6 oz (90.1 kg)   SpO2 99%   BMI 26.94 kg/m        Objective:   Physical Exam   General Mental Status- Alert. General Appearance- Not in acute distress.   Skin General: Color- Normal Color. Moisture- Normal Moisture.  Neck Carotid Arteries- Normal color. Moisture- Normal Moisture. No carotid bruits. No JVD.  Chest and Lung Exam Auscultation: Breath Sounds: CTA  Cardiovascular Auscultation:Rythm- RRR Murmurs & Other Heart Sounds:Auscultation of the heart reveals- No Murmurs.  Abdomen Inspection:-Inspeection Normal. Palpation/Percussion:Note:No mass. Palpation and Percussion of the abdomen reveal- Non Tender, Non Distended + BS, no rebound or guarding.   Neurologic Cranial Nerve exam:- CN III-XII intact(No nystagmus), symmetric smile. Strength:- 5/5 equal and symmetric strength both upper and lower extremities.    Lower ext- no pedal edema that I can appreciate. Pt notes years chronic left swollen ankle post fx. Calfs symmetric. Negative homans signs.     Assessment & Plan:   Assessment and Plan    Left Knee Fracture Stable after fall 5 weeks ago. No surgical intervention required. Immobilized with brace for 3 weeks. Now ambulatory with some discomfort. Follow-up with orthopedist scheduled for November 16, 2023. -Continue current management and  follow-up with orthopedist as  planned.  Atrial Fibrillation Chronic condition, rate controlled on Xarelto and Metoprolol. Pulse rate at 70. -Continue Xarelto and Metoprolol as prescribed.  Hypertension Managed on Cardizem CD 240mg  daily and Lopressor 100mg  twice daily. -Continue Cardizem CD 240mg  daily and Lopressor 100mg  twice daily.  Hyperlipidemia Managed on Crestor 10mg  daily. -Continue Crestor 10mg  daily.  Gastroesophageal Reflux Disease (GERD) Intermittent symptoms, managed with occasional use of Prilosec. -Continue Prilosec as needed.  Allergic Rhinitis Intermittent symptoms, managed with occasional use of Flonase. -Continue Flonase as needed, especially during allergy season.  General Health Maintenance -Continue low sugar and low cholesterol diet. -Ensure all medications are up to date. -Review labs and adjust management as necessary.   Follow up date to be determined after lab review.        Esperanza Richters, PA-C

## 2023-10-24 NOTE — Patient Instructions (Signed)
Left Knee Fracture Stable after fall 5 weeks ago. No surgical intervention required. Immobilized with brace for 3 weeks. Now ambulatory with some discomfort. Follow-up with orthopedist scheduled for November 16, 2023. -Continue current management and follow-up with orthopedist as planned.  Atrial Fibrillation Chronic condition, rate controlled on Xarelto and Metoprolol. Pulse rate at 70. -Continue Xarelto and Metoprolol as prescribed.  Hypertension Managed on Cardizem CD 240mg  daily and Lopressor 100mg  twice daily. -Continue Cardizem CD 240mg  daily and Lopressor 100mg  twice daily.  Hyperlipidemia Managed on Crestor 10mg  daily. -Continue Crestor 10mg  daily.  Gastroesophageal Reflux Disease (GERD) Intermittent symptoms, managed with occasional use of Prilosec. -Continue Prilosec as needed.  Allergic Rhinitis Intermittent symptoms, managed with occasional use of Flonase. -Continue Flonase as needed, especially during allergy season.  General Health Maintenance -Continue low sugar and low cholesterol diet. -Ensure all medications are up to date. -Review labs and adjust management as necessary.   Follow up date to be determined after lab review.

## 2023-10-25 ENCOUNTER — Encounter: Payer: Self-pay | Admitting: Medical

## 2023-11-02 DIAGNOSIS — S82002D Unspecified fracture of left patella, subsequent encounter for closed fracture with routine healing: Secondary | ICD-10-CM | POA: Diagnosis not present

## 2023-11-06 DIAGNOSIS — S82002D Unspecified fracture of left patella, subsequent encounter for closed fracture with routine healing: Secondary | ICD-10-CM | POA: Diagnosis not present

## 2023-11-09 DIAGNOSIS — M79671 Pain in right foot: Secondary | ICD-10-CM | POA: Diagnosis not present

## 2023-11-09 DIAGNOSIS — L84 Corns and callosities: Secondary | ICD-10-CM | POA: Diagnosis not present

## 2023-11-09 DIAGNOSIS — B351 Tinea unguium: Secondary | ICD-10-CM | POA: Diagnosis not present

## 2023-11-09 DIAGNOSIS — M79672 Pain in left foot: Secondary | ICD-10-CM | POA: Diagnosis not present

## 2023-11-16 DIAGNOSIS — S82002A Unspecified fracture of left patella, initial encounter for closed fracture: Secondary | ICD-10-CM | POA: Diagnosis not present

## 2023-11-21 ENCOUNTER — Other Ambulatory Visit: Payer: Self-pay | Admitting: Family Medicine

## 2023-11-27 ENCOUNTER — Other Ambulatory Visit: Payer: Self-pay | Admitting: Family Medicine

## 2023-11-29 ENCOUNTER — Ambulatory Visit (INDEPENDENT_AMBULATORY_CARE_PROVIDER_SITE_OTHER): Payer: PPO

## 2023-11-29 DIAGNOSIS — I495 Sick sinus syndrome: Secondary | ICD-10-CM | POA: Diagnosis not present

## 2023-12-01 ENCOUNTER — Other Ambulatory Visit (HOSPITAL_COMMUNITY): Payer: Self-pay | Admitting: Orthopedic Surgery

## 2023-12-01 ENCOUNTER — Ambulatory Visit (INDEPENDENT_AMBULATORY_CARE_PROVIDER_SITE_OTHER): Payer: PPO

## 2023-12-01 DIAGNOSIS — I495 Sick sinus syndrome: Secondary | ICD-10-CM

## 2023-12-01 DIAGNOSIS — M25562 Pain in left knee: Secondary | ICD-10-CM

## 2023-12-01 DIAGNOSIS — S82002D Unspecified fracture of left patella, subsequent encounter for closed fracture with routine healing: Secondary | ICD-10-CM | POA: Diagnosis not present

## 2023-12-04 LAB — CUP PACEART REMOTE DEVICE CHECK
Battery Remaining Longevity: 29 mo
Battery Remaining Percentage: 21 %
Battery Voltage: 2.89 V
Brady Statistic RV Percent Paced: 18 %
Date Time Interrogation Session: 20250329012617
Implantable Lead Connection Status: 753985
Implantable Lead Implant Date: 20130528
Implantable Lead Location: 753860
Implantable Lead Model: 1948
Implantable Pulse Generator Implant Date: 20130528
Lead Channel Impedance Value: 650 Ohm
Lead Channel Pacing Threshold Amplitude: 0.5 V
Lead Channel Pacing Threshold Pulse Width: 0.4 ms
Lead Channel Sensing Intrinsic Amplitude: 12 mV
Lead Channel Setting Pacing Amplitude: 2.5 V
Lead Channel Setting Pacing Pulse Width: 0.4 ms
Lead Channel Setting Sensing Sensitivity: 2 mV
Pulse Gen Model: 1210
Pulse Gen Serial Number: 7328888

## 2023-12-07 DIAGNOSIS — S82042D Displaced comminuted fracture of left patella, subsequent encounter for closed fracture with routine healing: Secondary | ICD-10-CM | POA: Diagnosis not present

## 2023-12-09 ENCOUNTER — Encounter: Payer: Self-pay | Admitting: Cardiology

## 2023-12-13 DIAGNOSIS — M25562 Pain in left knee: Secondary | ICD-10-CM | POA: Diagnosis not present

## 2023-12-13 DIAGNOSIS — S82002D Unspecified fracture of left patella, subsequent encounter for closed fracture with routine healing: Secondary | ICD-10-CM | POA: Diagnosis not present

## 2023-12-13 DIAGNOSIS — M1712 Unilateral primary osteoarthritis, left knee: Secondary | ICD-10-CM | POA: Diagnosis not present

## 2023-12-17 ENCOUNTER — Other Ambulatory Visit: Payer: Self-pay | Admitting: Family Medicine

## 2024-01-01 DIAGNOSIS — D0422 Carcinoma in situ of skin of left ear and external auricular canal: Secondary | ICD-10-CM | POA: Diagnosis not present

## 2024-01-09 NOTE — Addendum Note (Signed)
 Addended by: Lott Rouleau A on: 01/09/2024 04:48 PM   Modules accepted: Orders

## 2024-01-09 NOTE — Progress Notes (Signed)
 Remote pacemaker transmission.

## 2024-01-11 DIAGNOSIS — B351 Tinea unguium: Secondary | ICD-10-CM | POA: Diagnosis not present

## 2024-01-11 DIAGNOSIS — M79671 Pain in right foot: Secondary | ICD-10-CM | POA: Diagnosis not present

## 2024-01-11 DIAGNOSIS — M79672 Pain in left foot: Secondary | ICD-10-CM | POA: Diagnosis not present

## 2024-01-11 DIAGNOSIS — L84 Corns and callosities: Secondary | ICD-10-CM | POA: Diagnosis not present

## 2024-01-12 NOTE — Progress Notes (Signed)
 Remote pacemaker transmission.

## 2024-01-19 DIAGNOSIS — M1712 Unilateral primary osteoarthritis, left knee: Secondary | ICD-10-CM | POA: Diagnosis not present

## 2024-01-31 ENCOUNTER — Ambulatory Visit

## 2024-01-31 ENCOUNTER — Other Ambulatory Visit: Payer: Self-pay | Admitting: Family Medicine

## 2024-02-08 ENCOUNTER — Other Ambulatory Visit: Payer: Self-pay | Admitting: Thoracic Surgery (Cardiothoracic Vascular Surgery)

## 2024-02-08 DIAGNOSIS — I7121 Aneurysm of the ascending aorta, without rupture: Secondary | ICD-10-CM

## 2024-02-17 DIAGNOSIS — M25562 Pain in left knee: Secondary | ICD-10-CM | POA: Diagnosis not present

## 2024-02-19 DIAGNOSIS — M79672 Pain in left foot: Secondary | ICD-10-CM | POA: Diagnosis not present

## 2024-02-19 DIAGNOSIS — M79671 Pain in right foot: Secondary | ICD-10-CM | POA: Diagnosis not present

## 2024-02-19 DIAGNOSIS — L89893 Pressure ulcer of other site, stage 3: Secondary | ICD-10-CM | POA: Diagnosis not present

## 2024-02-19 DIAGNOSIS — L03115 Cellulitis of right lower limb: Secondary | ICD-10-CM | POA: Diagnosis not present

## 2024-02-19 DIAGNOSIS — M216X1 Other acquired deformities of right foot: Secondary | ICD-10-CM | POA: Diagnosis not present

## 2024-02-22 ENCOUNTER — Encounter: Payer: PPO | Admitting: Family Medicine

## 2024-02-25 ENCOUNTER — Other Ambulatory Visit: Payer: Self-pay | Admitting: Family Medicine

## 2024-02-28 DIAGNOSIS — M9904 Segmental and somatic dysfunction of sacral region: Secondary | ICD-10-CM | POA: Diagnosis not present

## 2024-02-28 DIAGNOSIS — M9901 Segmental and somatic dysfunction of cervical region: Secondary | ICD-10-CM | POA: Diagnosis not present

## 2024-02-28 DIAGNOSIS — M9903 Segmental and somatic dysfunction of lumbar region: Secondary | ICD-10-CM | POA: Diagnosis not present

## 2024-02-28 DIAGNOSIS — M9902 Segmental and somatic dysfunction of thoracic region: Secondary | ICD-10-CM | POA: Diagnosis not present

## 2024-02-29 DIAGNOSIS — M9902 Segmental and somatic dysfunction of thoracic region: Secondary | ICD-10-CM | POA: Diagnosis not present

## 2024-02-29 DIAGNOSIS — M9901 Segmental and somatic dysfunction of cervical region: Secondary | ICD-10-CM | POA: Diagnosis not present

## 2024-02-29 DIAGNOSIS — M9904 Segmental and somatic dysfunction of sacral region: Secondary | ICD-10-CM | POA: Diagnosis not present

## 2024-02-29 DIAGNOSIS — M9903 Segmental and somatic dysfunction of lumbar region: Secondary | ICD-10-CM | POA: Diagnosis not present

## 2024-03-01 ENCOUNTER — Ambulatory Visit: Payer: Self-pay | Admitting: Cardiology

## 2024-03-01 ENCOUNTER — Ambulatory Visit (INDEPENDENT_AMBULATORY_CARE_PROVIDER_SITE_OTHER): Payer: PPO

## 2024-03-01 DIAGNOSIS — I495 Sick sinus syndrome: Secondary | ICD-10-CM | POA: Diagnosis not present

## 2024-03-01 DIAGNOSIS — M9903 Segmental and somatic dysfunction of lumbar region: Secondary | ICD-10-CM | POA: Diagnosis not present

## 2024-03-01 DIAGNOSIS — M9901 Segmental and somatic dysfunction of cervical region: Secondary | ICD-10-CM | POA: Diagnosis not present

## 2024-03-01 DIAGNOSIS — M9904 Segmental and somatic dysfunction of sacral region: Secondary | ICD-10-CM | POA: Diagnosis not present

## 2024-03-01 DIAGNOSIS — M9902 Segmental and somatic dysfunction of thoracic region: Secondary | ICD-10-CM | POA: Diagnosis not present

## 2024-03-01 LAB — CUP PACEART REMOTE DEVICE CHECK
Battery Remaining Longevity: 26 mo
Battery Remaining Percentage: 19 %
Battery Voltage: 2.89 V
Brady Statistic RV Percent Paced: 16 %
Date Time Interrogation Session: 20250627020356
Implantable Lead Connection Status: 753985
Implantable Lead Implant Date: 20130528
Implantable Lead Location: 753860
Implantable Lead Model: 1948
Implantable Pulse Generator Implant Date: 20130528
Lead Channel Impedance Value: 650 Ohm
Lead Channel Pacing Threshold Amplitude: 0.5 V
Lead Channel Pacing Threshold Pulse Width: 0.4 ms
Lead Channel Sensing Intrinsic Amplitude: 12 mV
Lead Channel Setting Pacing Amplitude: 2.5 V
Lead Channel Setting Pacing Pulse Width: 0.4 ms
Lead Channel Setting Sensing Sensitivity: 2 mV
Pulse Gen Model: 1210
Pulse Gen Serial Number: 7328888

## 2024-03-04 DIAGNOSIS — M9904 Segmental and somatic dysfunction of sacral region: Secondary | ICD-10-CM | POA: Diagnosis not present

## 2024-03-04 DIAGNOSIS — M9901 Segmental and somatic dysfunction of cervical region: Secondary | ICD-10-CM | POA: Diagnosis not present

## 2024-03-04 DIAGNOSIS — M9903 Segmental and somatic dysfunction of lumbar region: Secondary | ICD-10-CM | POA: Diagnosis not present

## 2024-03-04 DIAGNOSIS — M9902 Segmental and somatic dysfunction of thoracic region: Secondary | ICD-10-CM | POA: Diagnosis not present

## 2024-03-05 DIAGNOSIS — M9904 Segmental and somatic dysfunction of sacral region: Secondary | ICD-10-CM | POA: Diagnosis not present

## 2024-03-05 DIAGNOSIS — M9902 Segmental and somatic dysfunction of thoracic region: Secondary | ICD-10-CM | POA: Diagnosis not present

## 2024-03-05 DIAGNOSIS — M9901 Segmental and somatic dysfunction of cervical region: Secondary | ICD-10-CM | POA: Diagnosis not present

## 2024-03-05 DIAGNOSIS — M9903 Segmental and somatic dysfunction of lumbar region: Secondary | ICD-10-CM | POA: Diagnosis not present

## 2024-03-07 DIAGNOSIS — M9901 Segmental and somatic dysfunction of cervical region: Secondary | ICD-10-CM | POA: Diagnosis not present

## 2024-03-07 DIAGNOSIS — M9902 Segmental and somatic dysfunction of thoracic region: Secondary | ICD-10-CM | POA: Diagnosis not present

## 2024-03-07 DIAGNOSIS — M9903 Segmental and somatic dysfunction of lumbar region: Secondary | ICD-10-CM | POA: Diagnosis not present

## 2024-03-07 DIAGNOSIS — M9904 Segmental and somatic dysfunction of sacral region: Secondary | ICD-10-CM | POA: Diagnosis not present

## 2024-03-11 DIAGNOSIS — M9903 Segmental and somatic dysfunction of lumbar region: Secondary | ICD-10-CM | POA: Diagnosis not present

## 2024-03-11 DIAGNOSIS — M9904 Segmental and somatic dysfunction of sacral region: Secondary | ICD-10-CM | POA: Diagnosis not present

## 2024-03-11 DIAGNOSIS — M9901 Segmental and somatic dysfunction of cervical region: Secondary | ICD-10-CM | POA: Diagnosis not present

## 2024-03-11 DIAGNOSIS — M9902 Segmental and somatic dysfunction of thoracic region: Secondary | ICD-10-CM | POA: Diagnosis not present

## 2024-03-12 ENCOUNTER — Ambulatory Visit

## 2024-03-12 ENCOUNTER — Ambulatory Visit: Payer: Self-pay

## 2024-03-12 ENCOUNTER — Telehealth: Payer: Self-pay

## 2024-03-12 NOTE — Telephone Encounter (Signed)
Unsuccessful attempt to reach patient on preferred number listed in notes for scheduled AWV. Left message on voicemail okay to reschedule. 

## 2024-03-12 NOTE — Telephone Encounter (Signed)
 FYI Only or Action Required?: Action required by provider: wants prescription for lidocaine .  Patient was last seen in primary care on 10/24/2023 by Dorina Loving, PA-C.  Called Nurse Triage reporting Foot Pain.  Symptoms began several years ago.  Interventions attempted: OTC medications: lidocaine  cream and Prescription medications: gabapentin .  Symptoms are: unchanged.  Triage Disposition: See PCP Within 2 Weeks  Patient/caregiver understands and will follow disposition?: No, wishes to speak with PCP   Want a prescription for lidoderm  patches for his diabetic neuropathy.           Copied from CRM [id number]. Topic: Clinical - Red Word Triage >> Mar 12, 2024  3:59 PM Rosina D wrote: Reason from [facility]:[id number] called stating he has neuropathy in his feet to where it is burning and painful. The patient stated he has had this for years. The patient want to get a stronger lidocaine  for his feet. Patient has been using lidocaine  over the counter and he states it helps but he want a script for it because it is different than over the counter. Patient has burning and pain in his feet now [phone] Reason for Disposition  Foot pain is a chronic symptom (recurrent or ongoing AND present > 4 weeks)  Answer Assessment - Initial Assessment Questions 1. ONSET: When did the pain start?      Yes ago 2. LOCATION: Where is the pain located?      Bilateral foot pain 3. PAIN: How bad is the pain?    (Scale 1-10; or mild, moderate, severe)  - MILD (1-3): doesn't interfere with normal activities.   - MODERATE (4-7): interferes with normal activities (e.g., work or school) or awakens from sleep, limping.   - SEVERE (8-10): excruciating pain, unable to do any normal activities, unable to walk.      6/10 4. WORK OR EXERCISE: Has there been any recent work or exercise that involved this part of the body?      no 5. CAUSE: What do you think is causing the foot pain?     neuropathy 6.  OTHER SYMPTOMS: Do you have any other symptoms? (e.g., leg pain, rash, fever, numbness)     Burning and tingling in feet  Protocols used: Foot Pain-A-AH

## 2024-03-13 ENCOUNTER — Other Ambulatory Visit: Payer: Self-pay | Admitting: Family Medicine

## 2024-03-13 DIAGNOSIS — M9904 Segmental and somatic dysfunction of sacral region: Secondary | ICD-10-CM | POA: Diagnosis not present

## 2024-03-13 DIAGNOSIS — M9902 Segmental and somatic dysfunction of thoracic region: Secondary | ICD-10-CM | POA: Diagnosis not present

## 2024-03-13 DIAGNOSIS — M9903 Segmental and somatic dysfunction of lumbar region: Secondary | ICD-10-CM | POA: Diagnosis not present

## 2024-03-13 DIAGNOSIS — M9901 Segmental and somatic dysfunction of cervical region: Secondary | ICD-10-CM | POA: Diagnosis not present

## 2024-03-13 MED ORDER — LIDOCAINE 3 % EX CREA: 1.0000 | TOPICAL_CREAM | Freq: Two times a day (BID) | CUTANEOUS | 3 refills | Status: AC | PRN

## 2024-03-14 DIAGNOSIS — B351 Tinea unguium: Secondary | ICD-10-CM | POA: Diagnosis not present

## 2024-03-14 DIAGNOSIS — M9902 Segmental and somatic dysfunction of thoracic region: Secondary | ICD-10-CM | POA: Diagnosis not present

## 2024-03-14 DIAGNOSIS — M79671 Pain in right foot: Secondary | ICD-10-CM | POA: Diagnosis not present

## 2024-03-14 DIAGNOSIS — M79672 Pain in left foot: Secondary | ICD-10-CM | POA: Diagnosis not present

## 2024-03-14 DIAGNOSIS — M9904 Segmental and somatic dysfunction of sacral region: Secondary | ICD-10-CM | POA: Diagnosis not present

## 2024-03-14 DIAGNOSIS — M1712 Unilateral primary osteoarthritis, left knee: Secondary | ICD-10-CM | POA: Diagnosis not present

## 2024-03-14 DIAGNOSIS — M9903 Segmental and somatic dysfunction of lumbar region: Secondary | ICD-10-CM | POA: Diagnosis not present

## 2024-03-14 DIAGNOSIS — L89892 Pressure ulcer of other site, stage 2: Secondary | ICD-10-CM | POA: Diagnosis not present

## 2024-03-14 DIAGNOSIS — M9901 Segmental and somatic dysfunction of cervical region: Secondary | ICD-10-CM | POA: Diagnosis not present

## 2024-03-14 NOTE — Telephone Encounter (Signed)
 Called patient and left vm advising that medication was send into pharmacy.

## 2024-03-17 ENCOUNTER — Other Ambulatory Visit: Payer: Self-pay | Admitting: Interventional Cardiology

## 2024-03-18 ENCOUNTER — Other Ambulatory Visit: Payer: Self-pay | Admitting: Family Medicine

## 2024-03-19 DIAGNOSIS — M9901 Segmental and somatic dysfunction of cervical region: Secondary | ICD-10-CM | POA: Diagnosis not present

## 2024-03-19 DIAGNOSIS — M9904 Segmental and somatic dysfunction of sacral region: Secondary | ICD-10-CM | POA: Diagnosis not present

## 2024-03-19 DIAGNOSIS — M9903 Segmental and somatic dysfunction of lumbar region: Secondary | ICD-10-CM | POA: Diagnosis not present

## 2024-03-19 DIAGNOSIS — M9902 Segmental and somatic dysfunction of thoracic region: Secondary | ICD-10-CM | POA: Diagnosis not present

## 2024-03-21 DIAGNOSIS — M9904 Segmental and somatic dysfunction of sacral region: Secondary | ICD-10-CM | POA: Diagnosis not present

## 2024-03-21 DIAGNOSIS — M9901 Segmental and somatic dysfunction of cervical region: Secondary | ICD-10-CM | POA: Diagnosis not present

## 2024-03-21 DIAGNOSIS — M9903 Segmental and somatic dysfunction of lumbar region: Secondary | ICD-10-CM | POA: Diagnosis not present

## 2024-03-21 DIAGNOSIS — M1712 Unilateral primary osteoarthritis, left knee: Secondary | ICD-10-CM | POA: Diagnosis not present

## 2024-03-21 DIAGNOSIS — M9902 Segmental and somatic dysfunction of thoracic region: Secondary | ICD-10-CM | POA: Diagnosis not present

## 2024-03-22 DIAGNOSIS — M9901 Segmental and somatic dysfunction of cervical region: Secondary | ICD-10-CM | POA: Diagnosis not present

## 2024-03-22 DIAGNOSIS — M9902 Segmental and somatic dysfunction of thoracic region: Secondary | ICD-10-CM | POA: Diagnosis not present

## 2024-03-22 DIAGNOSIS — M9903 Segmental and somatic dysfunction of lumbar region: Secondary | ICD-10-CM | POA: Diagnosis not present

## 2024-03-22 DIAGNOSIS — M9904 Segmental and somatic dysfunction of sacral region: Secondary | ICD-10-CM | POA: Diagnosis not present

## 2024-03-27 DIAGNOSIS — M9903 Segmental and somatic dysfunction of lumbar region: Secondary | ICD-10-CM | POA: Diagnosis not present

## 2024-03-27 DIAGNOSIS — M9904 Segmental and somatic dysfunction of sacral region: Secondary | ICD-10-CM | POA: Diagnosis not present

## 2024-03-27 DIAGNOSIS — M9902 Segmental and somatic dysfunction of thoracic region: Secondary | ICD-10-CM | POA: Diagnosis not present

## 2024-03-27 DIAGNOSIS — M9901 Segmental and somatic dysfunction of cervical region: Secondary | ICD-10-CM | POA: Diagnosis not present

## 2024-03-28 DIAGNOSIS — M1712 Unilateral primary osteoarthritis, left knee: Secondary | ICD-10-CM | POA: Diagnosis not present

## 2024-03-29 DIAGNOSIS — M9904 Segmental and somatic dysfunction of sacral region: Secondary | ICD-10-CM | POA: Diagnosis not present

## 2024-03-29 DIAGNOSIS — M9903 Segmental and somatic dysfunction of lumbar region: Secondary | ICD-10-CM | POA: Diagnosis not present

## 2024-03-29 DIAGNOSIS — M9901 Segmental and somatic dysfunction of cervical region: Secondary | ICD-10-CM | POA: Diagnosis not present

## 2024-03-29 DIAGNOSIS — M9902 Segmental and somatic dysfunction of thoracic region: Secondary | ICD-10-CM | POA: Diagnosis not present

## 2024-03-31 NOTE — Progress Notes (Unsigned)
 Cardiology Office Note:    Date:  04/04/2024   ID:  Brian Davidson, DOB 10-Feb-1936, MRN [id number]  PCP:  Domenica Harlene LABOR, MD  Cardiologist:  Candyce Reek, MD  Electrophysiologist:  OLE ONEIDA HOLTS, MD   Referring MD: Domenica Harlene LABOR, MD   Chief Complaint  Patient presents with   Atrial Fibrillation    History of Present Illness:    Brian Davidson is a 88 y.o. male with a hx of atrial fibrillation, hypertension, hyperlipidemia who presents for follow-up.  Previously followed with Dr. Reek.  Echocardiogram 03/2023 showed EF 60 to 65%, normal RV function, severe left atrial enlargement, mild mitral regurgitation, mild to moderate aortic regurgitation, dilated ascending aorta measuring 44 mm.  Since last clinic visit, he reports he is doing well. Denies any chest pain, dyspnea, lightheadedness, syncope, or palpitations.  Does report some left lower extremity edema.  He typically walks 2.5 miles daily but is recovering after tripping and fracturing his left kneecap earlier this year.  He is taking Xarelto , denies bleeding issues.    Past Medical History:  Diagnosis Date   Allergy    Arthritis    Biliary dyskinesia    Carotid artery disease (HCC) 07/01/2016   Dyslipidemia 07/13/2017   Dysrhythmia    a fib   GERD (gastroesophageal reflux disease)    occasional   H/O measles    History of chicken pox    History of kidney stones    Hypertension    Neuromuscular disorder (HCC)    neuropathy feet    Persistent atrial fibrillation (HCC)    Pneumonia 1992   Presence of permanent cardiac pacemaker    Prostate cancer (HCC)    Tachycardia-bradycardia (HCC)    a. s/p STJ dual chamber pacemaker   Thiamine  deficiency 01/19/2018   Valvular heart disease 07/01/2016    Past Surgical History:  Procedure Laterality Date   CHOLECYSTECTOMY N/A 11/24/2015   Procedure: LAPAROSCOPIC CHOLECYSTECTOMY;  Surgeon: Camellia Blush, MD;  Location: [facility];  Service: General;  Laterality:  N/A;   CYSTOSCOPY  2016   EYE SURGERY     bil cataract   FRACTURE SURGERY  2005   left ankle   HERNIA REPAIR     left groin, no mesh   JOINT REPLACEMENT     PERMANENT PACEMAKER INSERTION N/A 01/31/2012   SJM Accent SR RF implanted by DR Allred   TOTAL KNEE ARTHROPLASTY Right 12/23/2019   Procedure: TOTAL KNEE ARTHROPLASTY;  Surgeon: Melodi Lerner, MD;  Location: [facility];  Service: Orthopedics;  Laterality: Right;     Current Medications: Current Meds  Medication Sig   Ascorbic Acid (VITAMIN C PO) Take 1 tablet by mouth daily at 6 (six) AM.   diclofenac  Sodium (VOLTAREN ) 1 % GEL Apply 2 g topically 4 (four) times daily. (Patient taking differently: Apply 2 g topically in the morning and at bedtime.)   diltiazem  (CARDIZEM  CD) 240 MG 24 hr capsule Take 1 capsule (240 mg total) by mouth daily.   fluticasone  (FLONASE ) 50 MCG/ACT nasal spray Use 2 spray(s) in each nostril once daily (Patient taking differently: Place 1 spray into both nostrils as needed.)   furosemide  (LASIX ) 20 MG tablet Take 1 tablet (20 mg total) by mouth daily.   gabapentin  (NEURONTIN ) 300 MG capsule Take 1 capsule (300 mg total) by mouth 2 (two) times daily.   Lidocaine  3 % CREA Apply 1 Dose topically 2 (two) times daily as needed.   metoprolol  tartrate (LOPRESSOR ) 100  MG tablet Take 1 tablet (100 mg total) by mouth 2 (two) times daily.   Multiple Vitamins-Minerals (MULTIVITAMIN WITH MINERALS) tablet Take 1 tablet by mouth daily.   rivaroxaban  (XARELTO ) 20 MG TABS tablet Take 1 tablet by mouth once daily   rosuvastatin  (CRESTOR ) 10 MG tablet Take 1 tablet by mouth once daily   Turmeric 500 MG TABS Take 500 mg by mouth daily.      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 He is active around the house and yard...   Parents are deceased and did not have cardiac issues but he has 2 siblings with atrial fibrillation and a sister-in-law  has a pacemaker.   Social Drivers of Corporate investment banker Strain: Low Risk  (12/26/2022)   Overall Financial Resource Strain (CARDIA)    Difficulty of Paying Living Expenses: Not hard at all  Food Insecurity: No Food Insecurity (12/26/2022)   Hunger Vital Sign    Worried About Running Out of Food in the Last Year: Never true    Ran Out of Food in the Last Year: Never true  Transportation Needs: No Transportation Needs (12/26/2022)   PRAPARE - Administrator, Civil Service (Medical): No    Lack of Transportation (Non-Medical): No  Physical Activity: Sufficiently Active (12/26/2022)   Exercise Vital Sign    Days of Exercise per Week: 5 days    Minutes of Exercise per Session: 70 min  Stress: No Stress Concern Present (12/26/2022)   [employer] of Occupational Health - Occupational Stress Questionnaire    Feeling of Stress : Not at all  Social Connections: Unknown (12/26/2022)   Social Connection and Isolation Panel    Frequency of Communication with Friends and Family: Three times a week    Frequency of Social Gatherings with Friends and Family: Twice a week    Attends Religious Services: Not on Marketing executive or Organizations: No    Attends Occupational hygienist Meetings: Never    Marital Status: Married     Family History: The patient's family history includes Arthritis in his mother; Drug abuse in his mother; Heart disease in his brother, father, and sister; Heart disease (age of onset: 66) in his sister; Hypertension in his brother, daughter, mother, and son; Neuropathy in his brother and mother; Prostate cancer in his father; Stroke in his father. There is no history of Colon cancer.  ROS:   Please see the history of present illness.  All other systems reviewed and are negative.  EKGs/Labs/Other Studies Reviewed:    The following studies were reviewed today:   EKG:   04/04/2024: Atrial fibrillation, frequent ventricular pacing, rate 65  Recent Labs: 10/24/2023: ALT 15; BUN 24; Creatinine, Ser 1.25; Hemoglobin 13.6; Platelets 129.0; Potassium 4.1; Sodium 140  Recent Lipid Panel    Component Value Date/Time   CHOL 98 10/24/2023 1109   TRIG 83.0 10/24/2023 1109   HDL 45.10 10/24/2023 1109   CHOLHDL 2 10/24/2023 1109   VLDL 16.6 10/24/2023 1109   LDLCALC 37 10/24/2023 1109   LDLCALC 107 (H) 05/26/2020 1103    Physical Exam:    VS:  BP (!) 150/86 (BP Location: Left Arm, Patient Position: Sitting, Cuff Size: Normal)   Pulse 65   Ht 6' (1.829 m)   Wt 206 lb 12.8 oz (93.8 kg)   SpO2 98%   BMI 28.05 kg/m     Wt Readings from Last 3 Encounters:  04/04/24 206 lb 12.8 oz (93.8 kg)  10/24/23 198 lb 9.6 oz (90.1 kg)  09/04/23 207 lb (93.9 kg)     GEN:  Well nourished, well developed in no acute distress HEENT: Normal NECK: No JVD; No carotid bruits CARDIAC: Irregular, normal rate no murmurs RESPIRATORY:  Clear to auscultation without rales, wheezing or rhonchi  ABDOMEN: Soft, non-tender, non-distended MUSCULOSKELETAL:  No edema; No deformity  SKIN: Warm and dry NEUROLOGIC:  Alert and oriented x 3 PSYCHIATRIC:  Normal affect   ASSESSMENT:    1. Tachycardia-bradycardia syndrome (HCC)   2. Permanent atrial  fibrillation (HCC)   3. Pacemaker   4. Essential hypertension   5. Aortic dilatation (HCC)   6. Hyperlipidemia, unspecified hyperlipidemia type    PLAN:    Permanent atrial fibrillation: Rate controlled on diltiazem  240 mg daily and metoprolol  100 mg BID.  CHA2DS2-VASc 3 (hypertension, age x 2).  Continue Xarelto   Tachycardia bradycardia syndrome: Status post PPM.  Follows with EP  Chronic diastolic heart failure: On Lasix  20 mg daily.  Appears euvolemic.  Valvular heart disease: Mild to moderate AI, mild MR on echo 03/2023.  Will monitor  Dilated aorta: Dilated ascending aorta measuring 4 4 mm on echocardiogram 03/2023.  Measured 43mm  on CTA 03/2023.  Will monitor  Hypertension: On diltiazem  240 mg daily and metoprolol  100 mg BID.  BP elevated in clinic.  Reports under better control at home.  Asked to check BP twice daily for next week and let us  know results  Hyperlipidemia: On rosuvastatin  10 mg daily.  LDL 37 on 10/2023  RTC in 1 year  Medication Adjustments/Labs and Tests Ordered: Current medicines are reviewed at length with the patient today.  Concerns regarding medicines are outlined above.  Orders Placed This Encounter  Procedures   EKG 12-Lead   No orders of the defined types were placed in this encounter.   Patient Instructions  Medication Instructions:  Continue current medications *If you need a refill on your cardiac medications before your next appointment, please call your pharmacy*  Lab Work: none If you have labs (blood work) drawn today and your tests are completely normal, you will receive your results only by: MyChart Message (if you have MyChart) OR A paper copy in the mail If you have any lab test that is abnormal or we need to change your treatment, we will call you to review the results.  Testing/Procedures: none  Follow-Up: At [facility], you and your health needs are our priority.  As  part of our continuing mission to provide you  with exceptional heart care, our providers are all part of one team.  This team includes your primary Cardiologist (physician) and Advanced Practice Providers or APPs (Physician Assistants and Nurse Practitioners) who all work together to provide you with the care you need, when you need it.  Your next appointment:   1 year(s)  Provider:   Dr. Kate  We recommend signing up for the patient portal called MyChart.  Sign up information is provided on this After Visit Summary.  MyChart is used to connect with patients for Virtual Visits (Telemedicine).  Patients are able to view lab/test results, encounter notes, upcoming appointments, etc.  Non-urgent messages can be sent to your provider as well.   To learn more about what you can do with MyChart, go to [email or web address].   Other Instructions Please check blood pressure twice a day for next  week  and send those readings       Signed, Lonni LITTIE Kate, MD  04/04/2024 5:18 PM    [facility]

## 2024-04-02 ENCOUNTER — Ambulatory Visit (HOSPITAL_COMMUNITY)
Admission: RE | Admit: 2024-04-02 | Discharge: 2024-04-02 | Disposition: A | Discharge status: Home / Self Care | Source: Ambulatory Visit | Attending: Thoracic Surgery (Cardiothoracic Vascular Surgery) | Admitting: Thoracic Surgery (Cardiothoracic Vascular Surgery)

## 2024-04-02 DIAGNOSIS — I7121 Aneurysm of the ascending aorta, without rupture: Secondary | ICD-10-CM | POA: Insufficient documentation

## 2024-04-02 DIAGNOSIS — I700 Atherosclerosis of aorta: Secondary | ICD-10-CM | POA: Diagnosis not present

## 2024-04-03 DIAGNOSIS — M9903 Segmental and somatic dysfunction of lumbar region: Secondary | ICD-10-CM | POA: Diagnosis not present

## 2024-04-03 DIAGNOSIS — M9904 Segmental and somatic dysfunction of sacral region: Secondary | ICD-10-CM | POA: Diagnosis not present

## 2024-04-03 DIAGNOSIS — M9902 Segmental and somatic dysfunction of thoracic region: Secondary | ICD-10-CM | POA: Diagnosis not present

## 2024-04-03 DIAGNOSIS — M9901 Segmental and somatic dysfunction of cervical region: Secondary | ICD-10-CM | POA: Diagnosis not present

## 2024-04-04 ENCOUNTER — Ambulatory Visit: Attending: Cardiology | Admitting: Cardiology

## 2024-04-04 ENCOUNTER — Encounter: Payer: Self-pay | Admitting: Cardiology

## 2024-04-04 VITALS — BP 150/86 | HR 65 | Ht 72.0 in | Wt 206.8 lb

## 2024-04-04 DIAGNOSIS — Z950 Presence of cardiac pacemaker: Secondary | ICD-10-CM | POA: Diagnosis not present

## 2024-04-04 DIAGNOSIS — I4821 Permanent atrial fibrillation: Secondary | ICD-10-CM | POA: Diagnosis not present

## 2024-04-04 DIAGNOSIS — I10 Essential (primary) hypertension: Secondary | ICD-10-CM

## 2024-04-04 DIAGNOSIS — E785 Hyperlipidemia, unspecified: Secondary | ICD-10-CM

## 2024-04-04 DIAGNOSIS — I77819 Aortic ectasia, unspecified site: Secondary | ICD-10-CM

## 2024-04-04 DIAGNOSIS — I495 Sick sinus syndrome: Secondary | ICD-10-CM

## 2024-04-04 NOTE — Patient Instructions (Addendum)
 Medication Instructions:  Continue current medications *If you need a refill on your cardiac medications before your next appointment, please call your pharmacy*  Lab Work: none If you have labs (blood work) drawn today and your tests are completely normal, you will receive your results only by: MyChart Message (if you have MyChart) OR A paper copy in the mail If you have any lab test that is abnormal or we need to change your treatment, we will call you to review the results.  Testing/Procedures: none  Follow-Up: At [facility], you and your health needs are our priority.  As part of our continuing mission to provide you with exceptional heart care, our providers are all part of one team.  This team includes your primary Cardiologist (physician) and Advanced Practice Providers or APPs (Physician Assistants and Nurse Practitioners) who all work together to provide you with the care you need, when you need it.  Your next appointment:   1 year(s)  Provider:   Dr. Kate  We recommend signing up for the patient portal called MyChart.  Sign up information is provided on this After Visit Summary.  MyChart is used to connect with patients for Virtual Visits (Telemedicine).  Patients are able to view lab/test results, encounter notes, upcoming appointments, etc.  Non-urgent messages can be sent to your provider as well.   To learn more about what you can do with MyChart, go to [email or web address].   Other Instructions Please check blood pressure twice a day for next  week  and send those readings

## 2024-04-05 DIAGNOSIS — M9904 Segmental and somatic dysfunction of sacral region: Secondary | ICD-10-CM | POA: Diagnosis not present

## 2024-04-05 DIAGNOSIS — M9903 Segmental and somatic dysfunction of lumbar region: Secondary | ICD-10-CM | POA: Diagnosis not present

## 2024-04-05 DIAGNOSIS — M9902 Segmental and somatic dysfunction of thoracic region: Secondary | ICD-10-CM | POA: Diagnosis not present

## 2024-04-05 DIAGNOSIS — M9901 Segmental and somatic dysfunction of cervical region: Secondary | ICD-10-CM | POA: Diagnosis not present

## 2024-04-09 ENCOUNTER — Ambulatory Visit

## 2024-04-09 VITALS — BP 174/82 | HR 70 | Resp 18 | Ht 72.0 in | Wt 212.0 lb

## 2024-04-09 DIAGNOSIS — I7121 Aneurysm of the ascending aorta, without rupture: Secondary | ICD-10-CM | POA: Diagnosis not present

## 2024-04-09 NOTE — Patient Instructions (Signed)

## 2024-04-09 NOTE — Progress Notes (Signed)
 [address] [place] [address]             [phone]            RICHEY DOOLITTLE [id number] 1936/05/23   History of Present Illness: [name] is a 88 year old male with medical history of permanent atrial fibrillation, hypertension, carotid artery disease, peripheral neuropathy, osteoarthritis, heart murmur, hyperlipidemia and vertigo who presents for continued follow-up of his ascending thoracic aortic aneurysm.  Aneurysm measured 4.4 cm on CT scan of chest from 04/02/2024.  This has been consistent in size for the past couple years.    He does have hypertension and is currently not taking any medications at this time.  He is currently taking his blood pressure daily and is reporting readings to his cardiologist who will initiate medications.  He reports home readings in the 150s/80s.  He denies chest pain, shortness of breath, lower leg swelling.   Current Outpatient Medications on File Prior to Visit  Medication Sig Dispense Refill   Ascorbic Acid (VITAMIN C PO) Take 1 tablet by mouth daily at 6 (six) AM.     Calcium  Carbonate-Vitamin D (CALTRATE 600+D PO) Take 600 mg by mouth daily.      Cholecalciferol 50 MCG (2000 UT) TABS Take 2,000 Units by mouth daily.      diclofenac  Sodium (VOLTAREN ) 1 % GEL Apply 2 g topically 4 (four) times daily. (Patient taking differently: Apply 2 g topically in the morning and at bedtime.) 350 g 2   diltiazem  (CARDIZEM  CD) 240 MG 24 hr capsule Take 1 capsule (240 mg total) by mouth daily. 90 capsule 1   fluticasone  (FLONASE ) 50 MCG/ACT nasal spray Use 2 spray(s) in each nostril once daily (Patient taking differently: Place 1 spray into both nostrils as needed.) 16 g 0   furosemide  (LASIX ) 20 MG tablet Take 1 tablet (20 mg total) by mouth daily. 90 tablet 3   gabapentin  (NEURONTIN ) 300 MG capsule Take 1 capsule (300 mg total) by mouth 2 (two) times daily. 180 capsule 1   Lidocaine  3 % CREA Apply 1 Dose  topically 2 (two) times daily as needed. 85 g 3   metoprolol  tartrate (LOPRESSOR ) 100 MG tablet Take 1 tablet (100 mg total) by mouth 2 (two) times daily. 180 tablet 0   Multiple Vitamins-Minerals (MULTIVITAMIN WITH MINERALS) tablet Take 1 tablet by mouth daily. 30 tablet 3   omeprazole  (PRILOSEC) 40 MG capsule as needed (heartburn).     rivaroxaban  (XARELTO ) 20 MG TABS tablet Take 1 tablet by mouth once daily 90 tablet 1   rosuvastatin  (CRESTOR ) 10 MG tablet Take 1 tablet by mouth once daily 90 tablet 0   Turmeric 500 MG TABS Take 500 mg by mouth daily.      No current facility-administered medications on file prior to visit.     ROS: Review of Systems  Constitutional: Negative.   Respiratory: Negative.  Negative for cough and shortness of breath.   Cardiovascular: Negative.  Negative for chest pain and leg swelling.     BP (!) 174/82   Pulse 70   Resp 18   Ht 6' (1.829 m)   Wt 212 lb (96.2 kg)   SpO2 96%   BMI 28.75 kg/m   Physical Exam Constitutional:      Appearance: Normal appearance.  HENT:     Head: Normocephalic and atraumatic.  Cardiovascular:     Rate and Rhythm:  Rhythm irregularly irregular.     Heart sounds: Murmur heard.  Pulmonary:     Effort: Pulmonary effort is normal.     Breath sounds: Normal breath sounds.  Skin:    General: Skin is warm and dry.  Neurological:     General: No focal deficit present.     Mental Status: He is alert and oriented to person, place, and time.       Imaging: CLINICAL DATA:  Ascending aortic aneurysm.   EXAM: CT CHEST WITHOUT CONTRAST   TECHNIQUE: Multidetector CT imaging of the chest was performed following the standard protocol without IV contrast.   RADIATION DOSE REDUCTION: This exam was performed according to the departmental dose-optimization program which includes automated exposure control, adjustment of the mA and/or kV according to patient size and/or use of iterative reconstruction technique.    COMPARISON:  Chest CT 04/03/2023, 03/29/2022   FINDINGS: Cardiovascular: Dilated ascending aorta measuring 4.4 cm. Assessment of the aortic root is limited on this unenhanced exam. Moderate aortic atherosclerosis. Main pulmonary artery is dilated at 4.2 cm. There are coronary artery calcifications. Left subclavian pacemaker with lead tips in right atrium and ventricle. The heart is mildly enlarged. No pericardial effusion.   Mediastinum/Nodes: Shotty mediastinal lymph nodes, not enlarged by size criteria. Unremarkable appearance of the esophagus.   Lungs/Pleura: Small bilateral pleural effusions, left greater than right. Heterogeneous pulmonary parenchyma. Breathing motion artifact limits detailed parenchymal assessment tiny left upper lobe nodule measuring 4 mm series 302, image 42. This is unchanged from 2023 exam and considered definitively benign. No specific nodule follow-up is needed. There is central bronchial thickening.   Upper Abdomen: Atherosclerosis of upper abdominal vasculature. Cholecystectomy. No acute upper abdominal findings.   Musculoskeletal: Thoracic spondylosis with anterior spurring. Chronic inferior endplate compression deformity of T12. No acute osseous abnormalities.   IMPRESSION: 1. Ascending aortic aneurysm at 4.4 cm. Recommend annual imaging followup by CTA or MRA. This recommendation follows 2010 ACCF/AHA/AATS/ACR/ASA/SCA/SCAI/SIR/STS/SVM Guidelines for the Diagnosis and Management of Patients with Thoracic Aortic Disease. Circulation. 2010; 121: [id number]. Aortic aneurysm NOS (ICD10-I71.9) 2. Mild cardiomegaly. Small bilateral pleural effusions, left greater than right. 3. Coronary artery calcifications. 4. Central bronchial thickening.   Aortic Atherosclerosis (ICD10-I70.0).     Electronically Signed   By: Andrea Gasman M.D.   On: 04/07/2024 17:09     A/P:  Aneurysm of ascending aorta without rupture (HCC) - 4.4 cm ascending  thoracic aortic aneurysm on CT of chest. We discussed the natural history and and risk factors for growth of ascending aortic aneurysms. Discussed recommendations to minimize the risk of further expansion or dissection including careful blood pressure control, avoidance of contact sports and heavy lifting, attention to lipid management.  We covered the importance of smoking cessation.  The patient does not yet meet surgical criteria of >5.5cm. The patient is aware of signs and symptoms of aortic dissection and when to present to the emergency department    - Follow-up in 1 year with CT of chest for continued surveillance    Risk Modification:  Statin:  rosuvastatin  10mg   Smoking cessation instruction/counseling given:  commended patient for quitting and reviewed strategies for preventing relapses  Patient was counseled on importance of Blood Pressure Control  They are instructed to contact their Primary Care Physician if they start to have blood pressure readings over 130s/90s. Do not ever stop blood pressure medications on your own, unless instructed by healthcare professional.  Please avoid use of Fluoroquinolones as this can potentially increase  your risk of Aortic Rupture and/or Dissection  Patient educated on signs and symptoms of Aortic Dissection, handout also provided in AVS  Manuelita CHRISTELLA Rough, PA-C 04/09/24

## 2024-04-10 DIAGNOSIS — M9904 Segmental and somatic dysfunction of sacral region: Secondary | ICD-10-CM | POA: Diagnosis not present

## 2024-04-10 DIAGNOSIS — M9902 Segmental and somatic dysfunction of thoracic region: Secondary | ICD-10-CM | POA: Diagnosis not present

## 2024-04-10 DIAGNOSIS — M9901 Segmental and somatic dysfunction of cervical region: Secondary | ICD-10-CM | POA: Diagnosis not present

## 2024-04-10 DIAGNOSIS — M9903 Segmental and somatic dysfunction of lumbar region: Secondary | ICD-10-CM | POA: Diagnosis not present

## 2024-04-11 ENCOUNTER — Encounter: Payer: Self-pay | Admitting: Cardiology

## 2024-04-12 DIAGNOSIS — M9902 Segmental and somatic dysfunction of thoracic region: Secondary | ICD-10-CM | POA: Diagnosis not present

## 2024-04-12 DIAGNOSIS — M9903 Segmental and somatic dysfunction of lumbar region: Secondary | ICD-10-CM | POA: Diagnosis not present

## 2024-04-12 DIAGNOSIS — M9904 Segmental and somatic dysfunction of sacral region: Secondary | ICD-10-CM | POA: Diagnosis not present

## 2024-04-12 DIAGNOSIS — M9901 Segmental and somatic dysfunction of cervical region: Secondary | ICD-10-CM | POA: Diagnosis not present

## 2024-04-15 ENCOUNTER — Other Ambulatory Visit: Payer: Self-pay | Admitting: *Deleted

## 2024-04-15 DIAGNOSIS — I10 Essential (primary) hypertension: Secondary | ICD-10-CM

## 2024-04-15 MED ORDER — LOSARTAN POTASSIUM 25 MG PO TABS: 25.0000 mg | ORAL_TABLET | Freq: Every day | ORAL | 3 refills | Status: AC

## 2024-04-15 NOTE — Progress Notes (Signed)
 Called and made patient aware that per Dr. Kate to start taking losartan  25 mg daily and have lab work in 1  weeks.  Made patient to check blood pressure daily for next 2 weeks and send those readings. Understanding  verbalized.

## 2024-04-15 NOTE — Telephone Encounter (Signed)
 BP elevated.  Recommend adding losartan  25 mg daily.  Check BMET in 1 week.  Recommend checking BP daily for next 2 weeks and letting us  know results

## 2024-04-17 DIAGNOSIS — M9904 Segmental and somatic dysfunction of sacral region: Secondary | ICD-10-CM | POA: Diagnosis not present

## 2024-04-17 DIAGNOSIS — M79672 Pain in left foot: Secondary | ICD-10-CM | POA: Diagnosis not present

## 2024-04-17 DIAGNOSIS — M9901 Segmental and somatic dysfunction of cervical region: Secondary | ICD-10-CM | POA: Diagnosis not present

## 2024-04-17 DIAGNOSIS — M79671 Pain in right foot: Secondary | ICD-10-CM | POA: Diagnosis not present

## 2024-04-17 DIAGNOSIS — L03115 Cellulitis of right lower limb: Secondary | ICD-10-CM | POA: Diagnosis not present

## 2024-04-17 DIAGNOSIS — L89892 Pressure ulcer of other site, stage 2: Secondary | ICD-10-CM | POA: Diagnosis not present

## 2024-04-17 DIAGNOSIS — M9903 Segmental and somatic dysfunction of lumbar region: Secondary | ICD-10-CM | POA: Diagnosis not present

## 2024-04-17 DIAGNOSIS — M9902 Segmental and somatic dysfunction of thoracic region: Secondary | ICD-10-CM | POA: Diagnosis not present

## 2024-04-18 ENCOUNTER — Other Ambulatory Visit: Payer: Self-pay | Admitting: Family Medicine

## 2024-04-19 DIAGNOSIS — M9901 Segmental and somatic dysfunction of cervical region: Secondary | ICD-10-CM | POA: Diagnosis not present

## 2024-04-19 DIAGNOSIS — M9903 Segmental and somatic dysfunction of lumbar region: Secondary | ICD-10-CM | POA: Diagnosis not present

## 2024-04-19 DIAGNOSIS — M9902 Segmental and somatic dysfunction of thoracic region: Secondary | ICD-10-CM | POA: Diagnosis not present

## 2024-04-19 DIAGNOSIS — M9904 Segmental and somatic dysfunction of sacral region: Secondary | ICD-10-CM | POA: Diagnosis not present

## 2024-04-24 DIAGNOSIS — M9904 Segmental and somatic dysfunction of sacral region: Secondary | ICD-10-CM | POA: Diagnosis not present

## 2024-04-24 DIAGNOSIS — M9902 Segmental and somatic dysfunction of thoracic region: Secondary | ICD-10-CM | POA: Diagnosis not present

## 2024-04-24 DIAGNOSIS — M9903 Segmental and somatic dysfunction of lumbar region: Secondary | ICD-10-CM | POA: Diagnosis not present

## 2024-04-24 DIAGNOSIS — M9901 Segmental and somatic dysfunction of cervical region: Secondary | ICD-10-CM | POA: Diagnosis not present

## 2024-04-24 NOTE — Assessment & Plan Note (Signed)
 Encourage heart healthy diet such as MIND or DASH diet, increase exercise, avoid trans fats, simple carbohydrates and processed foods, consider a krill or fish or flaxseed oil cap daily. Tolerating Rosuvastatin

## 2024-04-24 NOTE — Assessment & Plan Note (Signed)
Needs to maintain high protein intake and will monitor

## 2024-04-24 NOTE — Assessment & Plan Note (Addendum)
 Well controlled, no changes to meds. Encouraged heart healthy diet such as the DASH diet and exercise as tolerated.

## 2024-04-24 NOTE — Assessment & Plan Note (Signed)
 [id number] acceptable, minimize simple carbs. Increase exercise as tolerated.

## 2024-04-24 NOTE — Assessment & Plan Note (Signed)
 Supplement and monitor

## 2024-04-24 NOTE — Assessment & Plan Note (Signed)
 Follows with cardiology and doing well, rate controlled and tolerating

## 2024-04-25 ENCOUNTER — Ambulatory Visit (INDEPENDENT_AMBULATORY_CARE_PROVIDER_SITE_OTHER): Payer: PPO | Admitting: Family Medicine

## 2024-04-25 VITALS — BP 159/82 | HR 57 | Temp 99.0°F | Resp 16 | Ht 72.0 in | Wt 208.0 lb

## 2024-04-25 DIAGNOSIS — E519 Thiamine deficiency, unspecified: Secondary | ICD-10-CM

## 2024-04-25 DIAGNOSIS — E782 Mixed hyperlipidemia: Secondary | ICD-10-CM | POA: Diagnosis not present

## 2024-04-25 DIAGNOSIS — I4821 Permanent atrial fibrillation: Secondary | ICD-10-CM | POA: Diagnosis not present

## 2024-04-25 DIAGNOSIS — E8809 Other disorders of plasma-protein metabolism, not elsewhere classified: Secondary | ICD-10-CM

## 2024-04-25 DIAGNOSIS — R739 Hyperglycemia, unspecified: Secondary | ICD-10-CM

## 2024-04-25 DIAGNOSIS — I10 Essential (primary) hypertension: Secondary | ICD-10-CM | POA: Diagnosis not present

## 2024-04-25 LAB — CBC WITH DIFFERENTIAL/PLATELET
Basophils Absolute: 0 K/uL (ref 0.0–0.1)
Basophils Relative: 0.6 % (ref 0.0–3.0)
Eosinophils Absolute: 0.2 K/uL (ref 0.0–0.7)
Eosinophils Relative: 2.9 % (ref 0.0–5.0)
HCT: 43.6 % (ref 39.0–52.0)
Hemoglobin: 14.1 g/dL (ref 13.0–17.0)
Lymphocytes Relative: 16.8 % (ref 12.0–46.0)
Lymphs Abs: 1 K/uL (ref 0.7–4.0)
MCHC: 32.4 g/dL (ref 30.0–36.0)
MCV: 94.5 fl (ref 78.0–100.0)
Monocytes Absolute: 0.9 K/uL (ref 0.1–1.0)
Monocytes Relative: 14.7 % — ABNORMAL HIGH (ref 3.0–12.0)
Neutro Abs: 3.8 K/uL (ref 1.4–7.7)
Neutrophils Relative %: 65 % (ref 43.0–77.0)
Platelets: 152 K/uL (ref 150.0–400.0)
RBC: 4.61 Mil/uL (ref 4.22–5.81)
RDW: 14.6 % (ref 11.5–15.5)
WBC: 5.9 K/uL (ref 4.0–10.5)

## 2024-04-25 LAB — LIPID PANEL
Cholesterol: 110 mg/dL (ref 0–200)
HDL: 49.4 mg/dL (ref >39.00)
LDL Cholesterol: 46 mg/dL (ref 0–99)
NonHDL: 60.34
Total CHOL/HDL Ratio: 2
Triglycerides: 70 mg/dL (ref 0.0–149.0)
VLDL: 14 mg/dL (ref 0.0–40.0)

## 2024-04-25 LAB — COMPREHENSIVE METABOLIC PANEL WITH GFR
ALT: 15 U/L (ref 0–53)
AST: 23 U/L (ref 0–37)
Albumin: 4.2 g/dL (ref 3.5–5.2)
Alkaline Phosphatase: 61 U/L (ref 39–117)
BUN: 19 mg/dL (ref 6–23)
CO2: 29 meq/L (ref 19–32)
Calcium: 9.4 mg/dL (ref 8.4–10.5)
Chloride: 104 meq/L (ref 96–112)
Creatinine, Ser: 1.31 mg/dL (ref 0.40–1.50)
GFR: 49.25 mL/min — ABNORMAL LOW (ref >60.00)
Glucose, Bld: 88 mg/dL (ref 70–99)
Potassium: 4.8 meq/L (ref 3.5–5.1)
Sodium: 141 meq/L (ref 135–145)
Total Bilirubin: 1.2 mg/dL (ref 0.2–1.2)
Total Protein: 6.9 g/dL (ref 6.0–8.3)

## 2024-04-25 LAB — HEMOGLOBIN A1C: Hgb A1c MFr Bld: 5.7 % (ref 4.6–6.5)

## 2024-04-25 LAB — TSH: TSH: 1.89 u[IU]/mL (ref 0.35–5.50)

## 2024-04-25 NOTE — Patient Instructions (Addendum)
 Hypertension, Adult High blood pressure (hypertension) is when the force of blood pumping through the arteries is too strong. The arteries are the blood vessels that carry blood from the heart throughout the body. Hypertension forces the heart to work harder to pump blood and may cause arteries to become narrow or stiff. Untreated or uncontrolled hypertension can lead to a heart attack, heart failure, a stroke, kidney disease, and other problems. A blood pressure reading consists of a higher number over a lower number. Ideally, your blood pressure should be below 120/80. The first (top) number is called the systolic pressure. It is a measure of the pressure in your arteries as your heart beats. The second (bottom) number is called the diastolic pressure. It is . [id number]           a measure of the pressure in your arteries as the heart relaxes. What are the causes? The exact cause of this condition is not known. There are some conditions that result in high blood pressure. What increases the risk? Certain factors may make you more likely to develop high blood pressure. Some of these risk factors are under your control, including: Smoking. Not getting enough exercise or physical activity. Being overweight. Having too much fat, sugar, calories, or salt (sodium) in your diet. Drinking too much alcohol. Other risk factors include: Having a personal history of heart disease, diabetes, high cholesterol, or kidney disease. Stress. Having a family history of high blood pressure and high cholesterol. Having obstructive sleep apnea. Age. The risk increases with age. What are the signs or symptoms? High blood pressure may not cause symptoms. Very high blood pressure (hypertensive crisis) may cause: Headache. Fast or irregular heartbeats (palpitations). Shortness of breath. Nosebleed. Nausea and vomiting. Vision changes. Severe chest pain, dizziness, and seizures. How is this diagnosed? This  condition is diagnosed by measuring your blood pressure while you are seated, with your arm resting on a flat surface, your legs uncrossed, and your feet flat on the floor. The cuff of the blood pressure monitor will be placed directly against the skin of your upper arm at the level of your heart. Blood pressure should be measured at least twice using the same arm. Certain conditions can cause a difference in blood pressure between your right and left arms. If you have a high blood pressure reading during one visit or you have normal blood pressure with other risk factors, you may be asked to: Return on a different day to have your blood pressure checked again. Monitor your blood pressure at home for 1 week or longer. If you are diagnosed with hypertension, you may have other blood or imaging tests to help your health care provider understand your overall risk for other conditions. How is this treated? This condition is treated by making healthy lifestyle changes, such as eating healthy foods, exercising more, and reducing your alcohol intake. You may be referred for counseling on a healthy diet and physical activity. Your health care provider may prescribe medicine if lifestyle changes are not enough to get your blood pressure under control and if: Your systolic blood pressure is above 130. Your diastolic blood pressure is above 80. Your personal target blood pressure may vary depending on your medical conditions, your age, and other factors. Follow these instructions at home: Eating and drinking  Eat a diet that is high in fiber and potassium, and low in sodium, added sugar, and fat. An example of this eating plan is called the DASH diet. DASH  stands for Dietary Approaches to Stop Hypertension. To eat this way: Eat plenty of fresh fruits and vegetables. Try to fill one half of your plate at each meal with fruits and vegetables. Eat whole grains, such as whole-wheat pasta, brown rice, or whole-grain  bread. Fill about one fourth of your plate with whole grains. Eat or drink low-fat dairy products, such as skim milk or low-fat yogurt. Avoid fatty cuts of meat, processed or cured meats, and poultry with skin. Fill about one fourth of your plate with lean proteins, such as fish, chicken without skin, beans, eggs, or tofu. Avoid pre-made and processed foods. These tend to be higher in sodium, added sugar, and fat. Reduce your daily sodium intake. Many people with hypertension should eat less than 1,500 mg of sodium a day. Do not drink alcohol if: Your health care provider tells you not to drink. You are pregnant, may be pregnant, or are planning to become pregnant. If you drink alcohol: Limit how much you have to: 0-1 drink a day for women. 0-2 drinks a day for men. Know how much alcohol is in your drink. In the [place] one drink equals one 12 oz bottle of beer (355 mL), one 5 oz glass of wine (148 mL), or one 1 oz glass of hard liquor (44 mL). Lifestyle  Work with your health care provider to maintain a healthy body weight or to lose weight. Ask what an ideal weight is for you. Get at least 30 minutes of exercise that causes your heart to beat faster (aerobic exercise) most days of the week. Activities may include walking, swimming, or biking. Include exercise to strengthen your muscles (resistance exercise), such as Pilates or lifting weights, as part of your weekly exercise routine. Try to do these types of exercises for 30 minutes at least 3 days a week. Do not use any products that contain nicotine or tobacco. These products include cigarettes, chewing tobacco, and vaping devices, such as e-cigarettes. If you need help quitting, ask your health care provider. Monitor your blood pressure at home as told by your health care provider. Keep all follow-up visits. This is important. Medicines Take over-the-counter and prescription medicines only as told by your health care provider. Follow  directions carefully. Blood pressure medicines must be taken as prescribed. Do not skip doses of blood pressure medicine. Doing this puts you at risk for problems and can make the medicine less effective. Ask your health care provider about side effects or reactions to medicines that you should watch for. Contact a health care provider if you: Think you are having a reaction to a medicine you are taking. Have headaches that keep coming back (recurring). Feel dizzy. Have swelling in your ankles. Have trouble with your vision. Get help right away if you: Develop a severe headache or confusion. Have unusual weakness or numbness. Feel faint. Have severe pain in your chest or abdomen. Vomit repeatedly. Have trouble breathing. These symptoms may be an emergency. Get help right away. Call 911. Do not wait to see if the symptoms will go away. Do not drive yourself to the hospital. Summary Hypertension is when the force of blood pumping through your arteries is too strong. If this condition is not controlled, it may put you at risk for serious complications. Your personal target blood pressure may vary depending on your medical conditions, your age, and other factors. For most people, a normal blood pressure is less than 120/80. Hypertension is treated with lifestyle changes, medicines, or  a combination of both. Lifestyle changes include losing weight, eating a healthy, low-sodium diet, exercising more, and limiting alcohol. This information is not intended to replace advice given to you by your health care provider. Make sure you discuss any questions you have with your health care provider. Document Revised: 06/29/2021 Document Reviewed: 06/29/2021 Elsevier Patient Education  2024 [employer].

## 2024-04-26 ENCOUNTER — Ambulatory Visit: Payer: Self-pay | Admitting: Family Medicine

## 2024-04-26 DIAGNOSIS — M9904 Segmental and somatic dysfunction of sacral region: Secondary | ICD-10-CM | POA: Diagnosis not present

## 2024-04-26 DIAGNOSIS — M9901 Segmental and somatic dysfunction of cervical region: Secondary | ICD-10-CM | POA: Diagnosis not present

## 2024-04-26 DIAGNOSIS — M9902 Segmental and somatic dysfunction of thoracic region: Secondary | ICD-10-CM | POA: Diagnosis not present

## 2024-04-26 DIAGNOSIS — M9903 Segmental and somatic dysfunction of lumbar region: Secondary | ICD-10-CM | POA: Diagnosis not present

## 2024-04-28 ENCOUNTER — Other Ambulatory Visit: Payer: Self-pay | Admitting: Interventional Cardiology

## 2024-04-28 ENCOUNTER — Encounter: Payer: Self-pay | Admitting: Family Medicine

## 2024-04-28 NOTE — Progress Notes (Signed)
 Subjective:    Patient ID: Brian Davidson, male    DOB: [birth year], 88 y.o.   MRN: [id number]  Chief Complaint  Patient presents with  . Hypertension    Here for follow up    HPI Discussed the use of AI scribe software for clinical note transcription with the patient, who gave verbal consent to proceed.  History of Present Illness Brian Davidson is an 88 year old male with hypertension who presents for routine follow-up and blood pressure monitoring.  His blood pressure has been elevated, ranging from 150 to 166 mmHg at home. He has been monitoring his blood pressure twice daily for two weeks. He notes that his blood pressure was better today compared to two weeks ago, but it remains elevated. He is currently taking losartan  25 mg, which he started a week ago. He recalls being on losartan  previously but had to discontinue it due to unspecified reasons.  He mentions a history of a fall on January 12th, resulting in a fractured left kneecap, which has impacted his ability to walk. He is just starting to walk again after his injury. Previously, when he was walking regularly, his blood pressure was around 130 mmHg.  He underwent a CT scan about a month ago to monitor an aneurysm, which has grown slightly from 4.3 cm to 4.4 cm over three years. He has a family history of aneurysm, as his mother died from a ruptured abdominal aneurysm at age 54.  No headaches, chest pain, urinary or bowel troubles, or new skin or sensation issues. He has a history of neuropathy but is not currently taking thiamine  or any B vitamins. He previously took B vitamins but was taken off them when his levels were adequate.    Past Medical History:  Diagnosis Date  . Allergy   . Arthritis   . Biliary dyskinesia   . Carotid artery disease (HCC) 07/01/2016  . Dyslipidemia 07/13/2017  . Dysrhythmia    a fib  . GERD (gastroesophageal reflux disease)    occasional  . H/O measles   . History of chicken  pox   . History of kidney stones   . Hypertension   . Neuromuscular disorder (HCC)    neuropathy feet   . Persistent atrial fibrillation (HCC)   . Pneumonia 1992  . Presence of permanent cardiac pacemaker   . Prostate cancer (HCC)   . Tachycardia-bradycardia (HCC)    a. s/p STJ dual chamber pacemaker  . Thiamine  deficiency 01/19/2018  . Valvular heart disease 07/01/2016    Past Surgical History:  Procedure Laterality Date  . CHOLECYSTECTOMY N/A 11/24/2015   Procedure: LAPAROSCOPIC CHOLECYSTECTOMY;  Surgeon: Camellia Blush, MD;  Location: [facility];  Service: General;  Laterality: N/A;  . CYSTOSCOPY  2016  . EYE SURGERY     bil cataract  . FRACTURE SURGERY  2005   left ankle  . HERNIA REPAIR     left groin, no mesh  . JOINT REPLACEMENT    . PERMANENT PACEMAKER INSERTION N/A 01/31/2012   SJM Accent SR RF implanted by DR Allred  . TOTAL KNEE ARTHROPLASTY Right 12/23/2019   Procedure: TOTAL KNEE ARTHROPLASTY;  Surgeon: Melodi Lerner, MD;  Location: [facility];  Service: Orthopedics;  Laterality: Right;     Family History  Problem Relation Age of Onset  . Neuropathy Mother   . Hypertension Mother   . Arthritis Mother   . Drug abuse Mother   . Stroke Father   .  Heart disease Father   . Prostate cancer Father        not treated  . Heart disease Sister 76  . Heart disease Sister   . Heart disease Brother   . Neuropathy Brother   . Hypertension Brother   . Hypertension Daughter   . Hypertension Son   . Colon cancer Neg Hx     Social History   Socioeconomic History  . Marital status: Married    Spouse name: Rock  . Number of children: 1  . Years of education: Not on file  . Highest education level: GED or equivalent  Occupational History  . Occupation: Retired  . 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 Smoking status: Former    Types: Pipe, Cigars    Quit date: 11/18/1985    Years since quitting: 38.4  . Smokeless tobacco: Never  Vaping  Use  . Vaping status: Never Used  Substance and Sexual Activity  . Alcohol use: Yes    Alcohol/week: 3.0 standard drinks of alcohol    Types: 3 Cans of beer per week    Comment: occ  . Drug use: No  . Sexual activity: Not Currently  Other Topics Concern  . Not on file  Social History Narrative   Lives with wife, still works part-time as a Hospital doctor. He is active around the house and yard...   Parents are deceased and did not have cardiac issues but he has 2 siblings with atrial fibrillation and a sister-in-law  has a pacemaker.   Social Drivers of Corporate investment banker Strain: Low Risk  (04/18/2024)   Overall Financial Resource Strain (CARDIA)   . Difficulty of Paying Living Expenses: Not hard at all  Food Insecurity: No Food Insecurity (04/18/2024)   Hunger Vital Sign   . Worried About Programme researcher, broadcasting/film/video in the Last Year: Never true   . Ran Out of Food in the Last Year: Never true  Transportation Needs: No Transportation Needs (04/18/2024)   PRAPARE - Transportation   . Lack of Transportation (Medical): No   . Lack of Transportation (Non-Medical): No  Physical Activity: Sufficiently Active (04/18/2024)   Exercise Vital Sign   . Days of Exercise per Week: 3 days   . Minutes of Exercise per Session: 60 min  Stress: No Stress Concern Present (04/18/2024)   [employer] of Occupational Health - Occupational Stress Questionnaire   . Feeling of Stress: Not at all  Social Connections: Socially Integrated (04/18/2024)   Social Connection and Isolation Panel   . Frequency of Communication with Friends and Family: Twice a week   . Frequency of Social Gatherings with Friends and Family: Once a week   . Attends Religious Services: More than 4 times per year   . Active Member of Clubs or Organizations: Yes   . Attends Banker Meetings: More than 4 times per year   . Marital Status: Married  Catering manager Violence: Not At Risk (01/02/2023)   Humiliation, Afraid, Rape,  and Kick questionnaire   . Fear of Current or Ex-Partner: No   . Emotionally Abused: No   . Physically Abused: No   . Sexually Abused: No    Outpatient Medications Prior to Visit  Medication Sig Dispense Refill  . Calcium  Carbonate-Vitamin D (CALTRATE 600+D PO) Take 600 mg by mouth daily.     . Cholecalciferol 50 MCG (2000 UT) TABS Take 2,000 Units by mouth daily.     SABRA  diclofenac  Sodium (VOLTAREN ) 1 % GEL Apply 2 g topically 4 (four) times daily. (Patient taking differently: Apply 2 g topically in the morning and at bedtime.) 350 g 2  . diltiazem  (CARDIZEM  CD) 240 MG 24 hr capsule Take 1 capsule (240 mg total) by mouth daily. 90 capsule 1  . fluticasone  (FLONASE ) 50 MCG/ACT nasal spray Place 2 sprays into both nostrils daily. 16 g 5  . furosemide  (LASIX ) 20 MG tablet Take 1 tablet (20 mg total) by mouth daily. 90 tablet 3  . gabapentin  (NEURONTIN ) 300 MG capsule Take 1 capsule (300 mg total) by mouth 2 (two) times daily. 180 capsule 1  . Lidocaine  3 % CREA Apply 1 Dose topically 2 (two) times daily as needed. 85 g 3  . losartan  (COZAAR ) 25 MG tablet Take 1 tablet (25 mg total) by mouth daily. 90 tablet 3  . metoprolol  tartrate (LOPRESSOR ) 100 MG tablet Take 1 tablet (100 mg total) by mouth 2 (two) times daily. 180 tablet 0  . Multiple Vitamins-Minerals (MULTIVITAMIN WITH MINERALS) tablet Take 1 tablet by mouth daily. 30 tablet 3  . omeprazole  (PRILOSEC) 40 MG capsule as needed (heartburn).    . rivaroxaban  (XARELTO ) 20 MG TABS tablet Take 1 tablet by mouth once daily 90 tablet 1  . rosuvastatin  (CRESTOR ) 10 MG tablet Take 1 tablet by mouth once daily 90 tablet 0  . Turmeric 500 MG TABS Take 500 mg by mouth daily.     . Ascorbic Acid (VITAMIN C PO) Take 1 tablet by mouth daily at 6 (six) AM.     No facility-administered medications prior to visit.    Allergies  Allergen Reactions  . Diltiazem      Rash from Yellow dye in generic capsule.   Can take different brand.  Takes diltiazem  -  his allergy is to Cardizem   Other Reaction(s): Not available  diltiazem  hydrochloride  . Sulfa Antibiotics     Unknown childhood reaction    Review of Systems  Constitutional:  Negative for fever and malaise/fatigue.  HENT:  Negative for congestion.   Eyes:  Negative for blurred vision.  Respiratory:  Negative for shortness of breath.   Cardiovascular:  Negative for chest pain, palpitations and leg swelling.  Gastrointestinal:  Negative for abdominal pain, blood in stool and nausea.  Genitourinary:  Negative for dysuria and frequency.  Musculoskeletal:  Positive for falls.  Skin:  Negative for rash.  Neurological:  Positive for sensory change. Negative for dizziness, loss of consciousness and headaches.  Endo/Heme/Allergies:  Negative for environmental allergies.  Psychiatric/Behavioral:  Negative for depression. The patient is not nervous/anxious.        Objective:    Physical Exam Vitals reviewed.  Constitutional:      Appearance: Normal appearance. He is not ill-appearing.  HENT:     Head: Normocephalic and atraumatic.     Nose: Nose normal.  Eyes:     Conjunctiva/sclera: Conjunctivae normal.  Cardiovascular:     Rate and Rhythm: Normal rate.     Pulses: Normal pulses.     Heart sounds: Normal heart sounds. No murmur heard. Pulmonary:     Effort: Pulmonary effort is normal.     Breath sounds: Normal breath sounds. No wheezing.  Abdominal:     Palpations: Abdomen is soft. There is no mass.     Tenderness: There is no abdominal tenderness.  Musculoskeletal:     Cervical back: Normal range of motion.     Right lower leg: No edema.  Left lower leg: No edema.  Skin:    General: Skin is warm and dry.  Neurological:     General: No focal deficit present.     Mental Status: He is alert and oriented to person, place, and time.  Psychiatric:        Mood and Affect: Mood normal.     BP (!) 159/82 (BP Location: Right Arm, Patient Position: Sitting, Cuff Size:  Normal)   Pulse (!) 57   Temp 99 F (37.2 C) (Oral)   Resp 16   Ht 6' (1.829 m)   Wt 208 lb (94.3 kg)   SpO2 99%   BMI 28.21 kg/m  Wt Readings from Last 3 Encounters:  04/25/24 208 lb (94.3 kg)  04/09/24 212 lb (96.2 kg)  04/04/24 206 lb 12.8 oz (93.8 kg)    Diabetic Foot Exam - Simple   No data filed    Lab Results  Component Value Date   WBC 5.9 04/25/2024   HGB 14.1 04/25/2024   HCT 43.6 04/25/2024   PLT 152.0 04/25/2024   GLUCOSE 88 04/25/2024   CHOL 110 04/25/2024   TRIG 70.0 04/25/2024   HDL 49.40 04/25/2024   LDLCALC 46 04/25/2024   ALT 15 04/25/2024   AST 23 04/25/2024   NA 141 04/25/2024   K 4.8 04/25/2024   CL 104 04/25/2024   CREATININE 1.31 04/25/2024   BUN 19 04/25/2024   CO2 29 04/25/2024   TSH 1.89 04/25/2024   INR 1.5 (H) 12/16/2019   HGBA1C 5.7 04/25/2024    Lab Results  Component Value Date   TSH 1.89 04/25/2024   Lab Results  Component Value Date   WBC 5.9 04/25/2024   HGB 14.1 04/25/2024   HCT 43.6 04/25/2024   MCV 94.5 04/25/2024   PLT 152.0 04/25/2024   Lab Results  Component Value Date   NA 141 04/25/2024   K 4.8 04/25/2024   CO2 29 04/25/2024   GLUCOSE 88 04/25/2024   BUN 19 04/25/2024   CREATININE 1.31 04/25/2024   BILITOT 1.2 04/25/2024   ALKPHOS 61 04/25/2024   AST 23 04/25/2024   ALT 15 04/25/2024   PROT 6.9 04/25/2024   ALBUMIN 4.2 04/25/2024   CALCIUM  9.4 04/25/2024   ANIONGAP 5 04/09/2022   EGFR 48 (L) 03/30/2023   GFR 49.25 (L) 04/25/2024   Lab Results  Component Value Date   CHOL 110 04/25/2024   Lab Results  Component Value Date   HDL 49.40 04/25/2024   Lab Results  Component Value Date   LDLCALC 46 04/25/2024   Lab Results  Component Value Date   TRIG 70.0 04/25/2024   Lab Results  Component Value Date   CHOLHDL 2 04/25/2024   Lab Results  Component Value Date   HGBA1C 5.7 04/25/2024       Assessment & Plan:  Primary hypertension Assessment & Plan: Well controlled, no changes to  meds. Encouraged heart healthy diet such as the DASH diet and exercise as tolerated.    Orders: -     CBC with Differential/Platelet -     TSH  Mixed hyperlipidemia Assessment & Plan: Encourage heart healthy diet such as MIND or DASH diet, increase exercise, avoid trans fats, simple carbohydrates and processed foods, consider a krill or fish or flaxseed oil cap daily. Tolerating Rosuvastatin   Orders: -     Lipid panel  Hyperglycemia Assessment & Plan: hgba1c acceptable, minimize simple carbs. Increase exercise as tolerated.   Orders: -  Comprehensive metabolic panel with GFR -     Hemoglobin A1c  Hypoalbuminemia Assessment & Plan: Needs to maintain high protein intake and will monitor   Permanent atrial fibrillation (HCC) Assessment & Plan: Follows with cardiology and doing well, rate controlled and tolerating    Thiamine  deficiency Assessment & Plan: Supplement and monitor   Orders: -     Vitamin B1    Assessment and Plan Assessment & Plan Essential hypertension with chronic kidney disease Blood pressure readings at home range from 150 to 166 mmHg, indicating poorly controlled hypertension. Currently on losartan  25 mg daily, which is known to protect kidneys but can also stress him. Awaiting blood work to assess kidney function and determine if losartan  can be continued or adjusted. - Order blood work including basic metabolic panel to assess kidney function - Discuss potential medication adjustment with Doctor Kate after reviewing kidney function results  Thiamine  deficiency Low thiamine  levels previously noted. Currently not on thiamine  supplementation due to past elevated levels. - Order blood test to check thiamine  levels - Consider reinstituting thiamine  supplementation based on test results  Peripheral neuropathy Chronic neuropathy with no new symptoms or changes in sensation. No current thiamine  supplementation, which was previously used. -  Reassess thiamine  levels to determine if supplementation is needed  Thoracic aortic aneurysm, stable Thoracic aortic aneurysm measured at 4.4 cm, slightly increased from previous 4.3 cm over three years. No immediate intervention required unless it reaches 5 cm. - Continue annual CT scans to monitor aneurysm size  Recording duration: 13 minutes     Harlene Horton, MD

## 2024-04-29 ENCOUNTER — Encounter: Payer: Self-pay | Admitting: Cardiology

## 2024-04-29 ENCOUNTER — Other Ambulatory Visit: Payer: Self-pay | Admitting: *Deleted

## 2024-04-29 DIAGNOSIS — I10 Essential (primary) hypertension: Secondary | ICD-10-CM

## 2024-04-30 ENCOUNTER — Ambulatory Visit: Payer: Self-pay | Admitting: Cardiology

## 2024-04-30 LAB — BASIC METABOLIC PANEL WITH GFR
BUN/Creatinine Ratio: 12 (ref 10–24)
BUN: 15 mg/dL (ref 8–27)
CO2: 21 mmol/L (ref 20–29)
Calcium: 9.3 mg/dL (ref 8.6–10.2)
Chloride: 106 mmol/L (ref 96–106)
Creatinine, Ser: 1.29 mg/dL — ABNORMAL HIGH (ref 0.76–1.27)
Glucose: 91 mg/dL (ref 70–99)
Potassium: 4.3 mmol/L (ref 3.5–5.2)
Sodium: 141 mmol/L (ref 134–144)
eGFR: 54 mL/min/1.73 — ABNORMAL LOW (ref >59)

## 2024-04-30 LAB — VITAMIN B1: Vitamin B1 (Thiamine): 16 nmol/L (ref 8–30)

## 2024-05-01 DIAGNOSIS — M9902 Segmental and somatic dysfunction of thoracic region: Secondary | ICD-10-CM | POA: Diagnosis not present

## 2024-05-01 DIAGNOSIS — M9901 Segmental and somatic dysfunction of cervical region: Secondary | ICD-10-CM | POA: Diagnosis not present

## 2024-05-01 DIAGNOSIS — M9903 Segmental and somatic dysfunction of lumbar region: Secondary | ICD-10-CM | POA: Diagnosis not present

## 2024-05-01 DIAGNOSIS — M9904 Segmental and somatic dysfunction of sacral region: Secondary | ICD-10-CM | POA: Diagnosis not present

## 2024-05-03 DIAGNOSIS — M9903 Segmental and somatic dysfunction of lumbar region: Secondary | ICD-10-CM | POA: Diagnosis not present

## 2024-05-03 DIAGNOSIS — M9901 Segmental and somatic dysfunction of cervical region: Secondary | ICD-10-CM | POA: Diagnosis not present

## 2024-05-03 DIAGNOSIS — M9904 Segmental and somatic dysfunction of sacral region: Secondary | ICD-10-CM | POA: Diagnosis not present

## 2024-05-03 DIAGNOSIS — M9902 Segmental and somatic dysfunction of thoracic region: Secondary | ICD-10-CM | POA: Diagnosis not present

## 2024-05-07 ENCOUNTER — Ambulatory Visit (INDEPENDENT_AMBULATORY_CARE_PROVIDER_SITE_OTHER): Admitting: *Deleted

## 2024-05-07 VITALS — Ht 72.0 in | Wt 208.0 lb

## 2024-05-07 DIAGNOSIS — Z0000 Encounter for general adult medical examination without abnormal findings: Secondary | ICD-10-CM

## 2024-05-07 NOTE — Patient Instructions (Signed)
 Brian Davidson , Thank you for taking time out of your busy schedule to complete your Annual Wellness Visit with me. I enjoyed our conversation and look forward to speaking with you again next year. I, as well as your care team,  appreciate your ongoing commitment to your health goals. Please review the following plan we discussed and let me know if I can assist you in the future. Your Game plan/ To Do List     Follow up Visits: Next Medicare AWV with our clinical staff: 05/08/25 9am    Next Office Visit with your provider: 10/24/24 8:20am physical, [name], NP  Clinician Recommendations:  Aim for 30 minutes of exercise or brisk walking, 6-8 glasses of water , and 5 servings of fruits and vegetables each day.       This is a list of the screening recommended for you and due dates:  Health Maintenance  Topic Date Due   Medicare Annual Wellness Visit  01/02/2024   COVID-19 Vaccine (6 - 2025-26 season) 05/07/2025*   DTaP/Tdap/Td vaccine (3 - Td or Tdap) 05/28/2029   Pneumococcal Vaccine for age over 66  Completed   Flu Shot  Completed   Zoster (Shingles) Vaccine  Completed   HPV Vaccine  Aged Out   Meningitis B Vaccine  Aged Out  *Topic was postponed. The date shown is not the original due date.    Advanced directives: (Declined) Advance directive discussed with you today. Even though you declined this today, please call our office should you change your mind, and we can give you the proper paperwork for you to fill out. Advance Care Planning is important because it:  [x]  Makes sure you receive the medical care that is consistent with your values, goals, and preferences  [x]  It provides guidance to your family and loved ones and reduces their decisional burden about whether or not they are making the right decisions based on your wishes.  Follow the link provided in your after visit summary or read over the paperwork we have mailed to you to help you started getting your Advance  Directives in place. If you need assistance in completing these, please reach out to us  so that we can help you!  See attachments for Preventive Care and Fall Prevention Tips.

## 2024-05-07 NOTE — Progress Notes (Signed)
 Please attest this visit in the absence of patient primary care provider.    Subjective:   Brian Davidson is a 88 y.o. who presents for a Medicare Wellness preventive visit.  As a reminder, Annual Wellness Visits don't include a physical exam, and some assessments may be limited, especially if this visit is performed virtually. We may recommend an in-person follow-up visit with your provider if needed.  Visit Complete: Virtual I connected with  Brian Davidson on 05/07/24 by a audio enabled telemedicine application and verified that I am speaking with the correct person using two identifiers.  Patient Location: Home  Provider Location: Office/Clinic  I discussed the limitations of evaluation and management by telemedicine. The patient expressed understanding and agreed to proceed.  Vital Signs: Because this visit was a virtual/telehealth visit, some criteria may be missing or patient reported. Any vitals not documented were not able to be obtained and vitals that have been documented are patient reported.  VideoDeclined- This patient declined Librarian, academic. Therefore the visit was completed with audio only.  Persons Participating in Visit: Patient.  AWV Questionnaire: No: Patient Medicare AWV questionnaire was not completed prior to this visit.  Cardiac Risk Factors include: advanced age (>55men, >64 women);dyslipidemia;hypertension;male gender;Other (see comment), Risk factor comments: CKD, CAD, a-fib, hx of prostate cancer     Objective:    Today's Vitals   05/07/24 0915  Weight: 208 lb (94.3 kg)  Height: 6' (1.829 m)   Body mass index is 28.21 kg/m.     05/07/2024    9:25 AM 01/02/2023    1:40 PM 04/09/2022   12:54 PM 01/05/2022   10:53 AM 12/30/2021   11:05 AM 12/26/2021   10:46 AM 08/17/2021   10:16 AM  Advanced Directives  Does Patient Have a Medical Advance Directive? No No No No No No Yes  Type of Advance Directive       Healthcare  Power of Attorney  Does patient want to make changes to medical advance directive?       No - Patient declined  Copy of Healthcare Power of Attorney in Chart?       No - copy requested  Would patient like information on creating a medical advance directive? No - Patient declined No - Patient declined No - Patient declined No - Patient declined No - Patient declined      Current Medications (verified) Outpatient Encounter Medications as of 05/07/2024  Medication Sig   Calcium  Carbonate-Vitamin D (CALTRATE 600+D PO) Take 600 mg by mouth daily.    Cholecalciferol 50 MCG (2000 UT) TABS Take 2,000 Units by mouth daily.    diclofenac  Sodium (VOLTAREN ) 1 % GEL Apply 2 g topically 4 (four) times daily.   diltiazem  (CARDIZEM  CD) 240 MG 24 hr capsule Take 1 capsule (240 mg total) by mouth daily.   fluticasone  (FLONASE ) 50 MCG/ACT nasal spray Place 2 sprays into both nostrils daily. (Patient taking differently: Place 2 sprays into both nostrils daily as needed.)   furosemide  (LASIX ) 20 MG tablet Take 1 tablet by mouth once daily   gabapentin  (NEURONTIN ) 300 MG capsule Take 1 capsule (300 mg total) by mouth 2 (two) times daily.   Lidocaine  3 % CREA Apply 1 Dose topically 2 (two) times daily as needed.   losartan  (COZAAR ) 25 MG tablet Take 1 tablet (25 mg total) by mouth daily.   Magnesium 300 MG CAPS Take 2 capsules by mouth daily.   metoprolol  tartrate (LOPRESSOR ) 100  MG tablet Take 1 tablet (100 mg total) by mouth 2 (two) times daily.   Multiple Vitamins-Minerals (MULTIVITAMIN WITH MINERALS) tablet Take 1 tablet by mouth daily.   omeprazole  (PRILOSEC) 40 MG capsule as needed (heartburn).   rivaroxaban  (XARELTO ) 20 MG TABS tablet Take 1 tablet by mouth once daily   rosuvastatin  (CRESTOR ) 10 MG tablet Take 1 tablet by mouth once daily   Turmeric 500 MG TABS Take 500 mg by mouth daily.    No facility-administered encounter medications on file as of 05/07/2024.    Allergies (verified) Diltiazem  and Sulfa  antibiotics   History: Past Medical History:  Diagnosis Date   Allergy    Arthritis    Biliary dyskinesia    Carotid artery disease (HCC) 07/01/2016   Dyslipidemia 07/13/2017   Dysrhythmia    a fib   GERD (gastroesophageal reflux disease)    occasional   H/O measles    History of chicken pox    History of kidney stones    Hypertension    Neuromuscular disorder (HCC)    neuropathy feet    Persistent atrial fibrillation (HCC)    Pneumonia 1992   Presence of permanent cardiac pacemaker    Prostate cancer (HCC)    Tachycardia-bradycardia (HCC)    a. s/p STJ dual chamber pacemaker   Thiamine  deficiency 01/19/2018   Valvular heart disease 07/01/2016   Past Surgical History:  Procedure Laterality Date   CHOLECYSTECTOMY N/A 11/24/2015   Procedure: LAPAROSCOPIC CHOLECYSTECTOMY;  Surgeon: Camellia Blush, MD;  Location: [facility];  Service: General;  Laterality: N/A;   CYSTOSCOPY  2016   EYE SURGERY     bil cataract   FRACTURE SURGERY  2005   left ankle   HERNIA REPAIR     left groin, no mesh   JOINT REPLACEMENT     PERMANENT PACEMAKER INSERTION N/A 01/31/2012   SJM Accent SR RF implanted by DR Allred   TOTAL KNEE ARTHROPLASTY Right 12/23/2019   Procedure: TOTAL KNEE ARTHROPLASTY;  Surgeon: Melodi Lerner, MD;  Location: [facility];  Service: Orthopedics;  Laterality: Right;    Family History  Problem Relation Age of Onset   Neuropathy Mother    Hypertension Mother    Arthritis Mother    Drug abuse Mother    Stroke Father    Heart disease Father    Prostate cancer Father        not treated   Heart disease Sister 79   Heart disease Sister    Heart disease Brother    Neuropathy Brother    Hypertension Brother    Hypertension Daughter    Hypertension Son    Colon cancer Neg Hx    Social History   Socioeconomic History   Marital status: Married    Spouse name: Rock   Number of children: 1   Years of education: Not on file   Highest education level: GED or  equivalent  Occupational History   Occupation: Retired   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 He is active around the house and yard...   Parents are deceased and did not have cardiac issues but he has 2 siblings with atrial fibrillation and a sister-in-law  has a pacemaker.   Social Drivers of Corporate investment banker Strain: Low Risk  (05/07/2024)   Overall Financial Resource Strain (CARDIA)    Difficulty of Paying Living Expenses: Not hard at all  Food Insecurity: No Food Insecurity (05/07/2024)   Hunger Vital Sign    Worried About Running Out of Food in the Last Year: Never true    Ran Out of Food in the Last Year: Never true  Transportation Needs: No Transportation Needs (05/07/2024)   PRAPARE - Administrator, Civil Service (Medical): No    Lack of Transportation (Non-Medical): No  Physical Activity: Sufficiently Active (05/07/2024)   Exercise Vital Sign    Days of Exercise per Week: 3 days    Minutes of Exercise per Session: 60 min  Stress: No Stress Concern Present (05/07/2024)   [employer] of Occupational Health - Occupational Stress Questionnaire    Feeling of Stress: Not at all  Social Connections: Socially Integrated (05/07/2024)   Social Connection and Isolation Panel    Frequency of Communication with Friends and Family: Twice a week    Frequency of Social Gatherings with Friends and Family: Once a week    Attends Religious Services: More than 4 times  per year    Active Member of [employer] or Organizations: Yes    Attends Engineer, structural: More than 4 times per year    Marital Status: Married    Tobacco Counseling Counseling given: Not Answered    Clinical Intake:  Pre-visit preparation completed: Yes  Pain : No/denies pain     BMI - recorded: 28.21 Nutritional Status: BMI 25 -29 Overweight Nutritional Risks: None Diabetes: No  Lab Results  Component Value Date   HGBA1C 5.7 04/25/2024   HGBA1C 5.6 07/31/2023   HGBA1C 5.6 12/15/2022     How often do you need to have someone help you when you read instructions, pamphlets, or other written materials from your doctor or pharmacy?: 1 - Never What is the last grade level you completed in school?: GED  Interpreter Needed?: No  Information entered by :: Lolita Libra, CMA   Activities of Daily Living     05/07/2024    9:21 AM  In your present state of health, do you have any difficulty performing the following activities:  Hearing? 1  Vision? 0  Difficulty concentrating or making decisions? 0  Walking or climbing stairs? 0  Dressing or bathing? 0  Doing errands, shopping? 0  Preparing Food and eating ? N  Using the Toilet? N  In the past six months, have you accidently leaked urine? N  Do you have problems with loss of bowel control? N  Managing your Medications? N  Managing your Finances? N  Housekeeping or managing your Housekeeping? N    Patient Care Team: Domenica Harlene LABOR, MD as PCP - General (Family Medicine) Dann Candyce RAMAN, MD as PCP - Cardiology (Cardiology) Cindie Ole DASEN, MD as PCP - Electrophysiology (Cardiology) Kelsie Agent, MD (Inactive) as Consulting Physician (Cardiology) Licia Quintin DASEN, MD as Consulting Physician (Family Medicine) Armbruster, Elspeth SQUIBB, MD as Consulting Physician (Gastroenterology) Melodi Lerner, MD as Consulting  Physician (Orthopedic Surgery) Patrcia Cough, MD as Consulting Physician (Radiation  Oncology) Nieves Cough, MD as Consulting Physician (Urology) Crawford, Morna Pickle, NP as Nurse Practitioner (Hematology and Oncology) Starla Wendelyn BIRCH, RN as Registered Nurse Camillo Slack (Ophthalmology)  I have updated your Care Teams any recent Medical Services you may have received from other providers in the past year.     Assessment:   This is a routine wellness examination for Oscar.  Hearing/Vision screen Hearing Screening - Comments:: Wears bilateral hearing aids Vision Screening - Comments:: Up to date with [facility]   Goals Addressed   None    Depression Screen     05/07/2024    9:26 AM 04/25/2024   10:23 AM 01/02/2023    1:42 PM 12/15/2022    9:12 AM 12/07/2021   10:10 AM 08/23/2021    2:59 PM 11/24/2020   10:00 AM  PHQ 2/9 Scores  PHQ - 2 Score 0 0 0 0 0 0 0  PHQ- 9 Score 0 0  0       Fall Risk     05/07/2024    9:31 AM 04/25/2024   10:23 AM 12/26/2022    9:35 AM 12/15/2022    9:12 AM 12/30/2021   11:06 AM  Fall Risk   Falls in the past year? 0 0 0 0 1  Number falls in past yr: 0 0 0 0 0  Injury with Fall? 0 0 0 0 1  Risk for fall due to : No Fall Risks No Fall Risks No Fall Risks  History of fall(s)  Follow up Education provided Falls evaluation completed Falls evaluation completed Falls evaluation completed Falls evaluation completed      Data saved with a previous flowsheet row definition    MEDICARE RISK AT HOME:  Medicare Risk at Home Any stairs in or around the home?: Yes If so, are there any without handrails?: No Home free of loose throw rugs in walkways, pet beds, electrical cords, etc?: Yes Adequate lighting in your home to reduce risk of falls?: Yes Life alert?: No Use of a cane, walker or w/c?: No Grab bars in the bathroom?: No Shower chair or bench in shower?: No Elevated toilet seat or a handicapped toilet?: Yes  TIMED UP AND GO:  Was the test performed?  No,audio  Cognitive Function: 6CIT completed    01/10/2017    3:39 PM   MMSE - Mini Mental State Exam  Orientation to time 5   Orientation to Place 5   Registration 3   Attention/ Calculation 5   Recall 2   Language- name 2 objects 2   Language- repeat 1  Language- follow 3 step command 3   Language- read & follow direction 1   Write a sentence 1   Copy design 1   Total score 29      Data saved with a previous flowsheet row definition        05/07/2024    9:27 AM 01/02/2023    1:45 PM 12/30/2021   11:12 AM  6CIT Screen  What Year? 0 points 0 points 0 points  What month? 0 points 0 points 0 points  What time? 0 points 0 points 0 points  Count back from 20 0 points 0 points 0 points  Months in reverse 0 points 4 points 0 points  Repeat phrase 0 points 2 points 6 points  Total Score 0 points 6 points 6 points    Immunizations Immunization History  Administered Date(s) Administered   Fluad Quad(high Dose 65+) 05/06/2022, 05/06/2022   INFLUENZA, HIGH DOSE SEASONAL PF 06/18/2017, 06/11/2018, 04/29/2019, 04/28/2020, 05/10/2021, 05/02/2023, 05/02/2024   Influenza-Unspecified 06/11/2016, 05/02/2023   PFIZER(Purple Top)SARS-COV-2 Vaccination 09/19/2019, 09/24/2019, 10/10/2019, 05/30/2020, 04/22/2021   Pfizer Covid-19 Vaccine Bivalent Booster 94yrs & up 05/29/2021   Pneumococcal Conjugate-13 07/20/2015   Pneumococcal Polysaccharide-23 09/05/2016   Tdap 11/14/2014, 05/29/2019   Zoster Recombinant(Shingrix) 12/26/2016, 12/27/2016, 03/21/2017, 03/22/2017   Zoster, Live 07/25/2013    Screening Tests Health Maintenance  Topic Date Due   Medicare Annual Wellness (AWV)  01/02/2024   COVID-19 Vaccine (6 - 2025-26 season) 05/07/2025 (Originally 05/06/2024)   DTaP/Tdap/Td (3 - Td or Tdap) 05/28/2029   Pneumococcal Vaccine: 50+ Years  Completed   INFLUENZA VACCINE  Completed   Zoster Vaccines- Shingrix  Completed   HPV VACCINES  Aged Out   Meningococcal B Vaccine  Aged Out    Health Maintenance  Health Maintenance Due  Topic Date Due   Medicare  Annual Wellness (AWV)  01/02/2024   Health Maintenance Items Addressed: Declines Covid vaccine, all other HM up to date  Additional Screening:  Vision Screening: Recommended annual ophthalmology exams for early detection of glaucoma and other disorders of the eye. Would you like a referral to an eye doctor? No    Dental Screening: Recommended annual dental exams for proper oral hygiene  Community Resource Referral / Chronic Care Management: CRR required this visit?  No   CCM required this visit?  No   Plan:    I have personally reviewed and noted the following in the patient's chart:   Medical and social history Use of alcohol, tobacco or illicit drugs  Current medications and supplements including opioid prescriptions. Patient is not currently taking opioid prescriptions. Functional ability and status Nutritional status Physical activity Advanced directives List of other physicians Hospitalizations, surgeries, and ER visits in previous 12 months Vitals Screenings to include cognitive, depression, and falls Referrals and appointments  In addition, I have reviewed and discussed with patient certain preventive protocols, quality metrics, and best practice recommendations. A written personalized care plan for preventive services as well as general preventive health recommendations were provided to patient.   Lolita Libra, CMA   05/07/2024   After Visit Summary: (MyChart) Due to this being a telephonic visit, the after visit summary with patients personalized plan was offered to patient via MyChart   Notes: Nothing significant to report at this time.

## 2024-05-08 DIAGNOSIS — M9904 Segmental and somatic dysfunction of sacral region: Secondary | ICD-10-CM | POA: Diagnosis not present

## 2024-05-08 DIAGNOSIS — M9902 Segmental and somatic dysfunction of thoracic region: Secondary | ICD-10-CM | POA: Diagnosis not present

## 2024-05-08 DIAGNOSIS — M9901 Segmental and somatic dysfunction of cervical region: Secondary | ICD-10-CM | POA: Diagnosis not present

## 2024-05-08 DIAGNOSIS — M9903 Segmental and somatic dysfunction of lumbar region: Secondary | ICD-10-CM | POA: Diagnosis not present

## 2024-05-10 DIAGNOSIS — M9902 Segmental and somatic dysfunction of thoracic region: Secondary | ICD-10-CM | POA: Diagnosis not present

## 2024-05-10 DIAGNOSIS — M9904 Segmental and somatic dysfunction of sacral region: Secondary | ICD-10-CM | POA: Diagnosis not present

## 2024-05-10 DIAGNOSIS — M9901 Segmental and somatic dysfunction of cervical region: Secondary | ICD-10-CM | POA: Diagnosis not present

## 2024-05-10 DIAGNOSIS — M9903 Segmental and somatic dysfunction of lumbar region: Secondary | ICD-10-CM | POA: Diagnosis not present

## 2024-05-13 NOTE — Progress Notes (Signed)
 Remote pacemaker transmission.

## 2024-05-15 DIAGNOSIS — M9901 Segmental and somatic dysfunction of cervical region: Secondary | ICD-10-CM | POA: Diagnosis not present

## 2024-05-15 DIAGNOSIS — M25562 Pain in left knee: Secondary | ICD-10-CM | POA: Diagnosis not present

## 2024-05-15 DIAGNOSIS — M5032 Other cervical disc degeneration, mid-cervical region, unspecified level: Secondary | ICD-10-CM | POA: Diagnosis not present

## 2024-05-15 DIAGNOSIS — M9906 Segmental and somatic dysfunction of lower extremity: Secondary | ICD-10-CM | POA: Diagnosis not present

## 2024-05-15 DIAGNOSIS — M5033 Other cervical disc degeneration, cervicothoracic region: Secondary | ICD-10-CM | POA: Diagnosis not present

## 2024-05-15 DIAGNOSIS — M9903 Segmental and somatic dysfunction of lumbar region: Secondary | ICD-10-CM | POA: Diagnosis not present

## 2024-05-15 DIAGNOSIS — M545 Low back pain, unspecified: Secondary | ICD-10-CM | POA: Diagnosis not present

## 2024-05-15 DIAGNOSIS — M9904 Segmental and somatic dysfunction of sacral region: Secondary | ICD-10-CM | POA: Diagnosis not present

## 2024-05-15 DIAGNOSIS — M9902 Segmental and somatic dysfunction of thoracic region: Secondary | ICD-10-CM | POA: Diagnosis not present

## 2024-05-15 DIAGNOSIS — M51362 Other intervertebral disc degeneration, lumbar region with discogenic back pain and lower extremity pain: Secondary | ICD-10-CM | POA: Diagnosis not present

## 2024-05-15 DIAGNOSIS — M5134 Other intervertebral disc degeneration, thoracic region: Secondary | ICD-10-CM | POA: Diagnosis not present

## 2024-05-15 DIAGNOSIS — M9905 Segmental and somatic dysfunction of pelvic region: Secondary | ICD-10-CM | POA: Diagnosis not present

## 2024-05-17 DIAGNOSIS — B351 Tinea unguium: Secondary | ICD-10-CM | POA: Diagnosis not present

## 2024-05-17 DIAGNOSIS — M9904 Segmental and somatic dysfunction of sacral region: Secondary | ICD-10-CM | POA: Diagnosis not present

## 2024-05-17 DIAGNOSIS — M79671 Pain in right foot: Secondary | ICD-10-CM | POA: Diagnosis not present

## 2024-05-17 DIAGNOSIS — M25562 Pain in left knee: Secondary | ICD-10-CM | POA: Diagnosis not present

## 2024-05-17 DIAGNOSIS — M545 Low back pain, unspecified: Secondary | ICD-10-CM | POA: Diagnosis not present

## 2024-05-17 DIAGNOSIS — M9902 Segmental and somatic dysfunction of thoracic region: Secondary | ICD-10-CM | POA: Diagnosis not present

## 2024-05-17 DIAGNOSIS — L84 Corns and callosities: Secondary | ICD-10-CM | POA: Diagnosis not present

## 2024-05-17 DIAGNOSIS — M5033 Other cervical disc degeneration, cervicothoracic region: Secondary | ICD-10-CM | POA: Diagnosis not present

## 2024-05-17 DIAGNOSIS — M5032 Other cervical disc degeneration, mid-cervical region, unspecified level: Secondary | ICD-10-CM | POA: Diagnosis not present

## 2024-05-17 DIAGNOSIS — M9901 Segmental and somatic dysfunction of cervical region: Secondary | ICD-10-CM | POA: Diagnosis not present

## 2024-05-17 DIAGNOSIS — M9906 Segmental and somatic dysfunction of lower extremity: Secondary | ICD-10-CM | POA: Diagnosis not present

## 2024-05-17 DIAGNOSIS — L89892 Pressure ulcer of other site, stage 2: Secondary | ICD-10-CM | POA: Diagnosis not present

## 2024-05-17 DIAGNOSIS — M79672 Pain in left foot: Secondary | ICD-10-CM | POA: Diagnosis not present

## 2024-05-17 DIAGNOSIS — M5134 Other intervertebral disc degeneration, thoracic region: Secondary | ICD-10-CM | POA: Diagnosis not present

## 2024-05-17 DIAGNOSIS — M9905 Segmental and somatic dysfunction of pelvic region: Secondary | ICD-10-CM | POA: Diagnosis not present

## 2024-05-17 DIAGNOSIS — M9903 Segmental and somatic dysfunction of lumbar region: Secondary | ICD-10-CM | POA: Diagnosis not present

## 2024-05-17 DIAGNOSIS — M51362 Other intervertebral disc degeneration, lumbar region with discogenic back pain and lower extremity pain: Secondary | ICD-10-CM | POA: Diagnosis not present

## 2024-05-20 ENCOUNTER — Other Ambulatory Visit: Payer: Self-pay | Admitting: Family Medicine

## 2024-05-22 DIAGNOSIS — M51362 Other intervertebral disc degeneration, lumbar region with discogenic back pain and lower extremity pain: Secondary | ICD-10-CM | POA: Diagnosis not present

## 2024-05-22 DIAGNOSIS — M9901 Segmental and somatic dysfunction of cervical region: Secondary | ICD-10-CM | POA: Diagnosis not present

## 2024-05-22 DIAGNOSIS — M9905 Segmental and somatic dysfunction of pelvic region: Secondary | ICD-10-CM | POA: Diagnosis not present

## 2024-05-22 DIAGNOSIS — M25562 Pain in left knee: Secondary | ICD-10-CM | POA: Diagnosis not present

## 2024-05-22 DIAGNOSIS — M9903 Segmental and somatic dysfunction of lumbar region: Secondary | ICD-10-CM | POA: Diagnosis not present

## 2024-05-22 DIAGNOSIS — M5033 Other cervical disc degeneration, cervicothoracic region: Secondary | ICD-10-CM | POA: Diagnosis not present

## 2024-05-22 DIAGNOSIS — M9906 Segmental and somatic dysfunction of lower extremity: Secondary | ICD-10-CM | POA: Diagnosis not present

## 2024-05-22 DIAGNOSIS — M9904 Segmental and somatic dysfunction of sacral region: Secondary | ICD-10-CM | POA: Diagnosis not present

## 2024-05-22 DIAGNOSIS — M545 Low back pain, unspecified: Secondary | ICD-10-CM | POA: Diagnosis not present

## 2024-05-22 DIAGNOSIS — M5134 Other intervertebral disc degeneration, thoracic region: Secondary | ICD-10-CM | POA: Diagnosis not present

## 2024-05-22 DIAGNOSIS — M5032 Other cervical disc degeneration, mid-cervical region, unspecified level: Secondary | ICD-10-CM | POA: Diagnosis not present

## 2024-05-22 DIAGNOSIS — M9902 Segmental and somatic dysfunction of thoracic region: Secondary | ICD-10-CM | POA: Diagnosis not present

## 2024-05-23 DIAGNOSIS — Z8546 Personal history of malignant neoplasm of prostate: Secondary | ICD-10-CM | POA: Diagnosis not present

## 2024-05-29 DIAGNOSIS — M9906 Segmental and somatic dysfunction of lower extremity: Secondary | ICD-10-CM | POA: Diagnosis not present

## 2024-05-29 DIAGNOSIS — M545 Low back pain, unspecified: Secondary | ICD-10-CM | POA: Diagnosis not present

## 2024-05-29 DIAGNOSIS — M5033 Other cervical disc degeneration, cervicothoracic region: Secondary | ICD-10-CM | POA: Diagnosis not present

## 2024-05-29 DIAGNOSIS — M5032 Other cervical disc degeneration, mid-cervical region, unspecified level: Secondary | ICD-10-CM | POA: Diagnosis not present

## 2024-05-29 DIAGNOSIS — M9902 Segmental and somatic dysfunction of thoracic region: Secondary | ICD-10-CM | POA: Diagnosis not present

## 2024-05-29 DIAGNOSIS — M25562 Pain in left knee: Secondary | ICD-10-CM | POA: Diagnosis not present

## 2024-05-29 DIAGNOSIS — M9903 Segmental and somatic dysfunction of lumbar region: Secondary | ICD-10-CM | POA: Diagnosis not present

## 2024-05-29 DIAGNOSIS — M9904 Segmental and somatic dysfunction of sacral region: Secondary | ICD-10-CM | POA: Diagnosis not present

## 2024-05-29 DIAGNOSIS — M9905 Segmental and somatic dysfunction of pelvic region: Secondary | ICD-10-CM | POA: Diagnosis not present

## 2024-05-29 DIAGNOSIS — M9901 Segmental and somatic dysfunction of cervical region: Secondary | ICD-10-CM | POA: Diagnosis not present

## 2024-05-29 DIAGNOSIS — M51362 Other intervertebral disc degeneration, lumbar region with discogenic back pain and lower extremity pain: Secondary | ICD-10-CM | POA: Diagnosis not present

## 2024-05-29 DIAGNOSIS — M5134 Other intervertebral disc degeneration, thoracic region: Secondary | ICD-10-CM | POA: Diagnosis not present

## 2024-05-30 DIAGNOSIS — N400 Enlarged prostate without lower urinary tract symptoms: Secondary | ICD-10-CM | POA: Diagnosis not present

## 2024-05-30 DIAGNOSIS — Z8546 Personal history of malignant neoplasm of prostate: Secondary | ICD-10-CM | POA: Diagnosis not present

## 2024-05-31 ENCOUNTER — Ambulatory Visit: Payer: PPO

## 2024-05-31 DIAGNOSIS — I495 Sick sinus syndrome: Secondary | ICD-10-CM | POA: Diagnosis not present

## 2024-05-31 LAB — CUP PACEART REMOTE DEVICE CHECK
Battery Remaining Longevity: 24 mo
Battery Remaining Percentage: 17 %
Battery Voltage: 2.86 V
Brady Statistic RV Percent Paced: 16 %
Date Time Interrogation Session: 20250926023136
Implantable Lead Connection Status: 753985
Implantable Lead Implant Date: 20130528
Implantable Lead Location: 753860
Implantable Lead Model: 1948
Implantable Pulse Generator Implant Date: 20130528
Lead Channel Impedance Value: 660 Ohm
Lead Channel Pacing Threshold Amplitude: 0.5 V
Lead Channel Pacing Threshold Pulse Width: 0.4 ms
Lead Channel Sensing Intrinsic Amplitude: 12 mV
Lead Channel Setting Pacing Amplitude: 2.5 V
Lead Channel Setting Pacing Pulse Width: 0.4 ms
Lead Channel Setting Sensing Sensitivity: 2 mV
Pulse Gen Model: 1210
Pulse Gen Serial Number: 7328888

## 2024-06-05 DIAGNOSIS — M9903 Segmental and somatic dysfunction of lumbar region: Secondary | ICD-10-CM | POA: Diagnosis not present

## 2024-06-05 DIAGNOSIS — M9901 Segmental and somatic dysfunction of cervical region: Secondary | ICD-10-CM | POA: Diagnosis not present

## 2024-06-05 DIAGNOSIS — M51362 Other intervertebral disc degeneration, lumbar region with discogenic back pain and lower extremity pain: Secondary | ICD-10-CM | POA: Diagnosis not present

## 2024-06-05 DIAGNOSIS — M25562 Pain in left knee: Secondary | ICD-10-CM | POA: Diagnosis not present

## 2024-06-05 DIAGNOSIS — M9904 Segmental and somatic dysfunction of sacral region: Secondary | ICD-10-CM | POA: Diagnosis not present

## 2024-06-05 DIAGNOSIS — M5134 Other intervertebral disc degeneration, thoracic region: Secondary | ICD-10-CM | POA: Diagnosis not present

## 2024-06-05 DIAGNOSIS — M5033 Other cervical disc degeneration, cervicothoracic region: Secondary | ICD-10-CM | POA: Diagnosis not present

## 2024-06-05 DIAGNOSIS — M545 Low back pain, unspecified: Secondary | ICD-10-CM | POA: Diagnosis not present

## 2024-06-05 DIAGNOSIS — M5032 Other cervical disc degeneration, mid-cervical region, unspecified level: Secondary | ICD-10-CM | POA: Diagnosis not present

## 2024-06-05 DIAGNOSIS — M9905 Segmental and somatic dysfunction of pelvic region: Secondary | ICD-10-CM | POA: Diagnosis not present

## 2024-06-05 DIAGNOSIS — M9906 Segmental and somatic dysfunction of lower extremity: Secondary | ICD-10-CM | POA: Diagnosis not present

## 2024-06-05 DIAGNOSIS — M9902 Segmental and somatic dysfunction of thoracic region: Secondary | ICD-10-CM | POA: Diagnosis not present

## 2024-06-05 NOTE — Progress Notes (Signed)
 Remote PPM Transmission

## 2024-06-07 ENCOUNTER — Ambulatory Visit: Payer: Self-pay | Admitting: Cardiology

## 2024-06-12 DIAGNOSIS — M9902 Segmental and somatic dysfunction of thoracic region: Secondary | ICD-10-CM | POA: Diagnosis not present

## 2024-06-12 DIAGNOSIS — M5033 Other cervical disc degeneration, cervicothoracic region: Secondary | ICD-10-CM | POA: Diagnosis not present

## 2024-06-12 DIAGNOSIS — M9905 Segmental and somatic dysfunction of pelvic region: Secondary | ICD-10-CM | POA: Diagnosis not present

## 2024-06-12 DIAGNOSIS — M9903 Segmental and somatic dysfunction of lumbar region: Secondary | ICD-10-CM | POA: Diagnosis not present

## 2024-06-12 DIAGNOSIS — M545 Low back pain, unspecified: Secondary | ICD-10-CM | POA: Diagnosis not present

## 2024-06-12 DIAGNOSIS — M5032 Other cervical disc degeneration, mid-cervical region, unspecified level: Secondary | ICD-10-CM | POA: Diagnosis not present

## 2024-06-12 DIAGNOSIS — M5134 Other intervertebral disc degeneration, thoracic region: Secondary | ICD-10-CM | POA: Diagnosis not present

## 2024-06-12 DIAGNOSIS — M9904 Segmental and somatic dysfunction of sacral region: Secondary | ICD-10-CM | POA: Diagnosis not present

## 2024-06-12 DIAGNOSIS — M9906 Segmental and somatic dysfunction of lower extremity: Secondary | ICD-10-CM | POA: Diagnosis not present

## 2024-06-12 DIAGNOSIS — M9901 Segmental and somatic dysfunction of cervical region: Secondary | ICD-10-CM | POA: Diagnosis not present

## 2024-06-12 DIAGNOSIS — M51362 Other intervertebral disc degeneration, lumbar region with discogenic back pain and lower extremity pain: Secondary | ICD-10-CM | POA: Diagnosis not present

## 2024-06-12 DIAGNOSIS — M25562 Pain in left knee: Secondary | ICD-10-CM | POA: Diagnosis not present

## 2024-06-14 ENCOUNTER — Telehealth: Payer: Self-pay | Admitting: Family Medicine

## 2024-06-14 NOTE — Telephone Encounter (Signed)
 Copied from CRM [id number]. Topic: General - Billing Inquiry >> Jun 14, 2024  1:36 PM [place] E wrote: Reason for CRM: Patient had labs done in August and received a bill from [employer], when he hs never received a bill in the past from them. Patient would like this clarified, please call [phone] to discuss.

## 2024-06-16 ENCOUNTER — Other Ambulatory Visit: Payer: Self-pay | Admitting: Family Medicine

## 2024-06-16 ENCOUNTER — Other Ambulatory Visit: Payer: Self-pay | Admitting: Interventional Cardiology

## 2024-06-26 DIAGNOSIS — M79671 Pain in right foot: Secondary | ICD-10-CM | POA: Diagnosis not present

## 2024-06-26 DIAGNOSIS — M79672 Pain in left foot: Secondary | ICD-10-CM | POA: Diagnosis not present

## 2024-06-26 DIAGNOSIS — L89892 Pressure ulcer of other site, stage 2: Secondary | ICD-10-CM | POA: Diagnosis not present

## 2024-07-08 DIAGNOSIS — H524 Presbyopia: Secondary | ICD-10-CM | POA: Diagnosis not present

## 2024-07-08 DIAGNOSIS — H35033 Hypertensive retinopathy, bilateral: Secondary | ICD-10-CM | POA: Diagnosis not present

## 2024-07-08 DIAGNOSIS — H59812 Chorioretinal scars after surgery for detachment, left eye: Secondary | ICD-10-CM | POA: Diagnosis not present

## 2024-07-08 DIAGNOSIS — Z961 Presence of intraocular lens: Secondary | ICD-10-CM | POA: Diagnosis not present

## 2024-07-08 DIAGNOSIS — H04123 Dry eye syndrome of bilateral lacrimal glands: Secondary | ICD-10-CM | POA: Diagnosis not present

## 2024-07-08 DIAGNOSIS — H52223 Regular astigmatism, bilateral: Secondary | ICD-10-CM | POA: Diagnosis not present

## 2024-07-17 DIAGNOSIS — M9901 Segmental and somatic dysfunction of cervical region: Secondary | ICD-10-CM | POA: Diagnosis not present

## 2024-07-17 DIAGNOSIS — M5033 Other cervical disc degeneration, cervicothoracic region: Secondary | ICD-10-CM | POA: Diagnosis not present

## 2024-07-17 DIAGNOSIS — M51362 Other intervertebral disc degeneration, lumbar region with discogenic back pain and lower extremity pain: Secondary | ICD-10-CM | POA: Diagnosis not present

## 2024-07-17 DIAGNOSIS — M9904 Segmental and somatic dysfunction of sacral region: Secondary | ICD-10-CM | POA: Diagnosis not present

## 2024-07-17 DIAGNOSIS — M9905 Segmental and somatic dysfunction of pelvic region: Secondary | ICD-10-CM | POA: Diagnosis not present

## 2024-07-17 DIAGNOSIS — M9906 Segmental and somatic dysfunction of lower extremity: Secondary | ICD-10-CM | POA: Diagnosis not present

## 2024-07-17 DIAGNOSIS — M9902 Segmental and somatic dysfunction of thoracic region: Secondary | ICD-10-CM | POA: Diagnosis not present

## 2024-07-17 DIAGNOSIS — M545 Low back pain, unspecified: Secondary | ICD-10-CM | POA: Diagnosis not present

## 2024-07-17 DIAGNOSIS — M9903 Segmental and somatic dysfunction of lumbar region: Secondary | ICD-10-CM | POA: Diagnosis not present

## 2024-07-17 DIAGNOSIS — M5134 Other intervertebral disc degeneration, thoracic region: Secondary | ICD-10-CM | POA: Diagnosis not present

## 2024-07-17 DIAGNOSIS — M5032 Other cervical disc degeneration, mid-cervical region, unspecified level: Secondary | ICD-10-CM | POA: Diagnosis not present

## 2024-07-17 DIAGNOSIS — M25562 Pain in left knee: Secondary | ICD-10-CM | POA: Diagnosis not present

## 2024-07-29 ENCOUNTER — Other Ambulatory Visit: Payer: Self-pay | Admitting: Family Medicine

## 2024-07-31 DIAGNOSIS — B351 Tinea unguium: Secondary | ICD-10-CM | POA: Diagnosis not present

## 2024-07-31 DIAGNOSIS — M79672 Pain in left foot: Secondary | ICD-10-CM | POA: Diagnosis not present

## 2024-07-31 DIAGNOSIS — M79671 Pain in right foot: Secondary | ICD-10-CM | POA: Diagnosis not present

## 2024-07-31 DIAGNOSIS — L89892 Pressure ulcer of other site, stage 2: Secondary | ICD-10-CM | POA: Diagnosis not present

## 2024-08-09 ENCOUNTER — Ambulatory Visit: Admitting: Pulmonary Disease

## 2024-08-14 DIAGNOSIS — L209 Atopic dermatitis, unspecified: Secondary | ICD-10-CM | POA: Diagnosis not present

## 2024-08-14 DIAGNOSIS — L821 Other seborrheic keratosis: Secondary | ICD-10-CM | POA: Diagnosis not present

## 2024-08-14 DIAGNOSIS — Z129 Encounter for screening for malignant neoplasm, site unspecified: Secondary | ICD-10-CM | POA: Diagnosis not present

## 2024-08-14 DIAGNOSIS — D3610 Benign neoplasm of peripheral nerves and autonomic nervous system, unspecified: Secondary | ICD-10-CM | POA: Diagnosis not present

## 2024-08-14 DIAGNOSIS — L2089 Other atopic dermatitis: Secondary | ICD-10-CM | POA: Diagnosis not present

## 2024-08-14 DIAGNOSIS — D2271 Melanocytic nevi of right lower limb, including hip: Secondary | ICD-10-CM | POA: Diagnosis not present

## 2024-08-14 DIAGNOSIS — L570 Actinic keratosis: Secondary | ICD-10-CM | POA: Diagnosis not present

## 2024-08-14 DIAGNOSIS — Z86008 Personal history of in-situ neoplasm of other site: Secondary | ICD-10-CM | POA: Diagnosis not present

## 2024-08-30 ENCOUNTER — Ambulatory Visit: Payer: PPO

## 2024-08-30 DIAGNOSIS — I495 Sick sinus syndrome: Secondary | ICD-10-CM

## 2024-09-01 LAB — CUP PACEART REMOTE DEVICE CHECK
Battery Remaining Longevity: 22 mo
Battery Remaining Percentage: 16 %
Battery Voltage: 2.86 V
Brady Statistic RV Percent Paced: 17 %
Date Time Interrogation Session: 20251226020723
Implantable Lead Connection Status: 753985
Implantable Lead Implant Date: 20130528
Implantable Lead Location: 753860
Implantable Lead Model: 1948
Implantable Pulse Generator Implant Date: 20130528
Lead Channel Impedance Value: 660 Ohm
Lead Channel Pacing Threshold Amplitude: 0.5 V
Lead Channel Pacing Threshold Pulse Width: 0.4 ms
Lead Channel Sensing Intrinsic Amplitude: 12 mV
Lead Channel Setting Pacing Amplitude: 2.5 V
Lead Channel Setting Pacing Pulse Width: 0.4 ms
Lead Channel Setting Sensing Sensitivity: 2 mV
Pulse Gen Model: 1210
Pulse Gen Serial Number: 7328888

## 2024-09-02 ENCOUNTER — Ambulatory Visit: Payer: Self-pay | Admitting: Cardiology

## 2024-09-04 NOTE — Progress Notes (Signed)
 Remote PPM Transmission

## 2024-09-06 ENCOUNTER — Encounter: Payer: Self-pay | Admitting: Pulmonary Disease

## 2024-09-06 ENCOUNTER — Ambulatory Visit: Payer: Self-pay | Admitting: Cardiology

## 2024-09-06 ENCOUNTER — Ambulatory Visit: Attending: Pulmonary Disease | Admitting: Cardiology

## 2024-09-06 VITALS — BP 136/70 | HR 68 | Ht 72.0 in | Wt 213.0 lb

## 2024-09-06 DIAGNOSIS — I495 Sick sinus syndrome: Secondary | ICD-10-CM

## 2024-09-06 DIAGNOSIS — I10 Essential (primary) hypertension: Secondary | ICD-10-CM

## 2024-09-06 DIAGNOSIS — I4821 Permanent atrial fibrillation: Secondary | ICD-10-CM | POA: Diagnosis not present

## 2024-09-06 LAB — CUP PACEART INCLINIC DEVICE CHECK
Battery Remaining Longevity: 21 mo
Battery Voltage: 2.86 V
Brady Statistic RV Percent Paced: 18 %
Date Time Interrogation Session: 20260102123355
Implantable Lead Connection Status: 753985
Implantable Lead Implant Date: 20130528
Implantable Lead Location: 753860
Implantable Lead Model: 1948
Implantable Pulse Generator Implant Date: 20130528
Lead Channel Impedance Value: 662.5 Ohm
Lead Channel Pacing Threshold Amplitude: 0.75 V
Lead Channel Pacing Threshold Amplitude: 0.75 V
Lead Channel Pacing Threshold Pulse Width: 0.4 ms
Lead Channel Pacing Threshold Pulse Width: 0.4 ms
Lead Channel Sensing Intrinsic Amplitude: 12 mV
Lead Channel Setting Pacing Amplitude: 2.5 V
Lead Channel Setting Pacing Pulse Width: 0.4 ms
Lead Channel Setting Sensing Sensitivity: 2 mV
Pulse Gen Model: 1210
Pulse Gen Serial Number: 7328888

## 2024-09-06 NOTE — Patient Instructions (Signed)
 Medication Instructions:  None  *If you need a refill on your cardiac medications before your next appointment, please call your pharmacy*  Lab Work: Today BMET, CBC If you have labs (blood work) drawn today and your tests are completely normal, you will receive your results only by: MyChart Message (if you have MyChart) OR A paper copy in the mail If you have any lab test that is abnormal or we need to change your treatment, we will call you to review the results.  Testing/Procedures: None   Follow-Up: At [facility], you and your health needs are our priority.  As part of our continuing mission to provide you with exceptional heart care, our providers are all part of one team.  This team includes your primary Cardiologist (physician) and Advanced Practice Providers or APPs (Physician Assistants and Nurse Practitioners) who all work together to provide you with the care you need, when you need it.  Your next appointment:   1 year(s)  Provider:   Soyla Norton, MD  Or An EP APP   We recommend signing up for the patient portal called MyChart.  Sign up information is provided on this After Visit Summary.  MyChart is used to connect with patients for Virtual Visits (Telemedicine).  Patients are able to view lab/test results, encounter notes, upcoming appointments, etc.  Non-urgent messages can be sent to your provider as well.   To learn more about what you can do with MyChart, go to [email or web address].   Other Instructions None         Your physician recommends that you continue on your current medications as directed. Please refer to the Current Medication list given to you today.

## 2024-09-06 NOTE — Progress Notes (Signed)
" °  Electrophysiology Office Note:   ID:  Brian Davidson, DOB Mar 27, 1936, MRN [id number]  Primary Cardiologist: Brian LITTIE Nanas, MD Electrophysiologist: Brian Gladis Norton, MD      History of Present Illness:   Brian Davidson is a 89 y.o. male with h/o symptomatic bradycardia s/p ppm, permanent atrial fibrillation, dyslipidemia, carotid artery stenosis, hypertension seen today for routine electrophysiology followup.   Since last being seen in our clinic the patient reports doing well. Except for very cold days, walks at least 2 miles daily and states that he feels no fatigue, shortness of breath, or chest pain.  he denies palpitations, PND, orthopnea, nausea, vomiting, dizziness, syncope, edema, weight gain, or early satiety.   Review of systems complete and found to be negative unless listed in HPI.   EP Information / Studies Reviewed:    EKG is ordered today. Personal review as below.  EKG Interpretation Date/Time:  Friday September 06 2024 09:03:48 EST Ventricular Rate:  68 PR Interval:    QRS Duration:  106 QT Interval:  412 QTC Calculation: 438 R Axis:   -28  Text Interpretation: Atrial fibrillation Septal infarct , age undetermined When compared with ECG of 04-Apr-2024 16:37, Atrial fibrillation has replaced Electronic ventricular pacemaker Confirmed by Trudy [place] [phone]) on 09/06/2024 9:08:06 AM    PPM Interrogation-  reviewed in detail today,  See PACEART report.  Arrhythmia/Device History 01/31/2012 St Jude Medical Accent SR RF dual-chamber pacemaker (Dr. Kelsie)  Physical Exam:   VS:  BP 136/70   Pulse 68   Ht 6' (1.829 m)   Wt 213 lb (96.6 kg)   SpO2 97%   BMI 28.89 kg/m    Wt Readings from Last 3 Encounters:  09/06/24 213 lb (96.6 kg)  05/07/24 208 lb (94.3 kg)  04/25/24 208 lb (94.3 kg)     GEN: No acute distress  NECK: No JVD; No carotid bruits CARDIAC: Irregularly irregular rate and rhythm, no murmurs, rubs, gallops RESPIRATORY:  Clear to  auscultation without rales, wheezing or rhonchi  ABDOMEN: Soft, non-tender, non-distended EXTREMITIES:  No edema; No deformity   ASSESSMENT AND PLAN:    Symptomatic bradycardia s/p Abbott PPM  Normal PPM function See Pace Art report No changes today  Permanent atrial fibrillation Secondary hypercoagulable state Rate controlled afib on ECG today. Continue Xarelto , Metoprolol , Diltiazem . Will update BMET and CBC.  Hypertension BP improved since starting Losartan  last year. Continue current medications. He will follow up with Dr. Nanas in July.      Disposition:   Follow up with Dr. Norton in 12 months  Signed, [place] Trudy, PA-C  "

## 2024-09-07 LAB — CBC
Hematocrit: 45.8 % (ref 37.5–51.0)
Hemoglobin: 14.6 g/dL (ref 13.0–17.7)
MCH: 30.2 pg (ref 26.6–33.0)
MCHC: 31.9 g/dL (ref 31.5–35.7)
MCV: 95 fL (ref 79–97)
Platelets: 164 x10E3/uL (ref 150–450)
RBC: 4.83 x10E6/uL (ref 4.14–5.80)
RDW: 13.4 % (ref 11.6–15.4)
WBC: 6.5 x10E3/uL (ref 3.4–10.8)

## 2024-09-07 LAB — BASIC METABOLIC PANEL WITH GFR
BUN/Creatinine Ratio: 12 (ref 10–24)
BUN: 16 mg/dL (ref 8–27)
CO2: 23 mmol/L (ref 20–29)
Calcium: 9.5 mg/dL (ref 8.6–10.2)
Chloride: 105 mmol/L (ref 96–106)
Creatinine, Ser: 1.3 mg/dL — AB (ref 0.76–1.27)
Glucose: 102 mg/dL — AB (ref 70–99)
Potassium: 3.9 mmol/L (ref 3.5–5.2)
Sodium: 145 mmol/L — AB (ref 134–144)
eGFR: 54 mL/min/1.73 — AB

## 2024-09-09 ENCOUNTER — Encounter: Payer: Self-pay | Admitting: Family Medicine

## 2024-09-09 VITALS — BP 128/76 | HR 58 | Temp 98.0°F | Resp 16 | Ht 73.0 in | Wt 211.8 lb

## 2024-09-09 DIAGNOSIS — R0789 Other chest pain: Secondary | ICD-10-CM | POA: Diagnosis not present

## 2024-09-09 MED ORDER — DICLOFENAC SODIUM 1 % EX GEL: CUTANEOUS | 0 refills | Status: AC

## 2024-09-09 NOTE — Progress Notes (Signed)
 Musculoskeletal Exam  Patient: Brian Davidson DOB: 05/08/1936  DOS: 09/09/2024  SUBJECTIVE:  Chief Complaint:   Chief Complaint  Patient presents with   Back Pain    Back Pain    Brian Davidson is a 89 y.o.  male for evaluation and treatment of back pain.   Onset:  10 days ago.  No inj or change in activity.  Location: lower R Character:  aching  Progression of issue:  has worsened Associated symptoms: hurts when he leans/twisted No bruising, redness, swelling, shortness of breath, coughing, fevers, or rashes. Denies bowel/bladder incontinence or weakness Treatment: to date has been: none.   Neurovascular symptoms: no  Past Medical History:  Diagnosis Date   Allergy    Arthritis    Biliary dyskinesia    Carotid artery disease 07/01/2016   Dyslipidemia 07/13/2017   Dysrhythmia    a fib   GERD (gastroesophageal reflux disease)    occasional   H/O measles    History of chicken pox    History of kidney stones    Hypertension    Neuromuscular disorder (HCC)    neuropathy feet    Persistent atrial fibrillation (HCC)    Pneumonia 1992   Presence of permanent cardiac pacemaker    Prostate cancer (HCC)    Tachycardia-bradycardia (HCC)    a. s/p STJ dual chamber pacemaker   Thiamine  deficiency 01/19/2018   Valvular heart disease 07/01/2016    Objective:  VITAL SIGNS: BP 128/76 (BP Location: Left Arm, Patient Position: Sitting)   Pulse (!) 58   Temp 98 F (36.7 C) (Oral)   Resp 16   Ht 6' 1 (1.854 m)   Wt 211 lb 12.8 oz (96.1 kg)   SpO2 96%   BMI 27.94 kg/m  Constitutional: Well formed, well developed. No acute distress. HENT: Normocephalic, atraumatic.  Thorax & Lungs:  No accessory muscle use Musculoskeletal: low back.   Tenderness to palpation: Yes over the lateral lower rib cage on the right just posterior to the anterior axillary line; this is around the T8 dermatome Deformity: no Ecchymosis: no Straight leg test: negative for Poor hamstring  flexibility b/l. There is no crepitus, overlying skin changes, edema. Neurologic: Normal sensory function.  Gait is normal. Psychiatric: Normal mood. Age appropriate judgment and insight. Alert & oriented x 3.    Assessment:  Rib pain on right side - Plan: diclofenac  Sodium (VOLTAREN ) 1 % GEL  Plan: Stretch the area twice daily for 3 sets, 30 seconds hold for each set, heat, ice, Tylenol , topical NSAIDs.  Would consider physical therapy if no improvement. F/u prn. The patient voiced understanding and agreement to the plan.   [name], DO 09/09/2024  10:53 AM

## 2024-09-09 NOTE — Patient Instructions (Addendum)
 Heat (pad or rice pillow in microwave) over affected area, 10-15 minutes twice daily.   Ice/cold pack over area for 10-15 min twice daily.  OK to take Tylenol  1000 mg (2 extra strength tabs) or 975 mg (3 regular strength tabs) every 6 hours as needed.  Voltaren  gel 4 times daily as needed for pain. This is also available over the counter. Get whichever is cheaper.   Stretch the area twice daily as able. Hold the stretch for 30 seconds. Repeat twice.   Let us  know if you need anything.

## 2024-09-13 ENCOUNTER — Other Ambulatory Visit: Payer: Self-pay | Admitting: Family Medicine

## 2024-09-16 ENCOUNTER — Other Ambulatory Visit: Payer: Self-pay | Admitting: Family Medicine

## 2024-10-24 ENCOUNTER — Encounter: Admitting: Student

## 2024-11-14 ENCOUNTER — Ambulatory Visit: Admitting: Family Medicine

## 2025-01-02 ENCOUNTER — Ambulatory Visit: Admitting: Family Medicine

## 2025-01-15 ENCOUNTER — Encounter: Admitting: Medical

## 2025-05-08 ENCOUNTER — Ambulatory Visit
